# Patient Record
Sex: Female | Born: 1940 | Race: Black or African American | Hispanic: No | Marital: Single | Smoking: Former smoker
Health system: Southern US, Community
[De-identification: ages and names within clinical notes are randomized; demographics above are authoritative.]

## PROBLEM LIST (undated history)

## (undated) DIAGNOSIS — E7800 Pure hypercholesterolemia, unspecified: Secondary | ICD-10-CM

## (undated) DIAGNOSIS — I10 Essential (primary) hypertension: Secondary | ICD-10-CM

## (undated) HISTORY — PX: HAND SURGERY: SHX662

---

## 2000-09-03 ENCOUNTER — Emergency Department (HOSPITAL_COMMUNITY): Admission: EM | Admit: 2000-09-03 | Discharge: 2000-09-03 | Payer: Self-pay | Admitting: Emergency Medicine

## 2000-09-03 ENCOUNTER — Encounter: Payer: Self-pay | Admitting: *Deleted

## 2013-09-23 ENCOUNTER — Emergency Department (HOSPITAL_COMMUNITY)
Admission: EM | Admit: 2013-09-23 | Discharge: 2013-09-23 | Disposition: A | Discharge status: Home / Self Care | Payer: Medicare Other | Attending: Emergency Medicine | Admitting: Emergency Medicine

## 2013-09-23 ENCOUNTER — Encounter (HOSPITAL_COMMUNITY): Payer: Self-pay | Admitting: Emergency Medicine

## 2013-09-23 DIAGNOSIS — R112 Nausea with vomiting, unspecified: Secondary | ICD-10-CM | POA: Insufficient documentation

## 2013-09-23 DIAGNOSIS — E7800 Pure hypercholesterolemia, unspecified: Secondary | ICD-10-CM | POA: Insufficient documentation

## 2013-09-23 DIAGNOSIS — Z79899 Other long term (current) drug therapy: Secondary | ICD-10-CM | POA: Insufficient documentation

## 2013-09-23 DIAGNOSIS — I10 Essential (primary) hypertension: Secondary | ICD-10-CM | POA: Insufficient documentation

## 2013-09-23 DIAGNOSIS — Z7982 Long term (current) use of aspirin: Secondary | ICD-10-CM | POA: Insufficient documentation

## 2013-09-23 DIAGNOSIS — R110 Nausea: Secondary | ICD-10-CM

## 2013-09-23 HISTORY — DX: Pure hypercholesterolemia, unspecified: E78.00

## 2013-09-23 HISTORY — DX: Essential (primary) hypertension: I10

## 2013-09-23 LAB — URINALYSIS, ROUTINE W REFLEX MICROSCOPIC
Bilirubin Urine: NEGATIVE
GLUCOSE, UA: NEGATIVE mg/dL
HGB URINE DIPSTICK: NEGATIVE
Ketones, ur: NEGATIVE mg/dL
Nitrite: NEGATIVE
Protein, ur: NEGATIVE mg/dL
SPECIFIC GRAVITY, URINE: 1.015 (ref 1.005–1.030)
Urobilinogen, UA: 0.2 mg/dL (ref 0.0–1.0)
pH: 5.5 (ref 5.0–8.0)

## 2013-09-23 LAB — COMPREHENSIVE METABOLIC PANEL
ALT: 14 U/L (ref 0–35)
AST: 23 U/L (ref 0–37)
Albumin: 3.9 g/dL (ref 3.5–5.2)
Alkaline Phosphatase: 94 U/L (ref 39–117)
BUN: 15 mg/dL (ref 6–23)
CALCIUM: 9.6 mg/dL (ref 8.4–10.5)
CO2: 27 meq/L (ref 19–32)
Chloride: 103 mEq/L (ref 96–112)
Creatinine, Ser: 1.08 mg/dL (ref 0.50–1.10)
GFR calc Af Amer: 58 mL/min — ABNORMAL LOW (ref >90)
GFR, EST NON AFRICAN AMERICAN: 50 mL/min — AB (ref >90)
Glucose, Bld: 109 mg/dL — ABNORMAL HIGH (ref 70–99)
Potassium: 4.3 mEq/L (ref 3.7–5.3)
SODIUM: 142 meq/L (ref 137–147)
TOTAL PROTEIN: 7.2 g/dL (ref 6.0–8.3)
Total Bilirubin: 0.3 mg/dL (ref 0.3–1.2)

## 2013-09-23 LAB — CBC WITH DIFFERENTIAL/PLATELET
BASOS ABS: 0 10*3/uL (ref 0.0–0.1)
Basophils Relative: 0 % (ref 0–1)
EOS PCT: 0 % (ref 0–5)
Eosinophils Absolute: 0 10*3/uL (ref 0.0–0.7)
HCT: 42.2 % (ref 36.0–46.0)
Hemoglobin: 13.9 g/dL (ref 12.0–15.0)
LYMPHS PCT: 20 % (ref 12–46)
Lymphs Abs: 2 10*3/uL (ref 0.7–4.0)
MCH: 31.1 pg (ref 26.0–34.0)
MCHC: 32.9 g/dL (ref 30.0–36.0)
MCV: 94.4 fL (ref 78.0–100.0)
Monocytes Absolute: 0.6 10*3/uL (ref 0.1–1.0)
Monocytes Relative: 6 % (ref 3–12)
NEUTROS ABS: 7.6 10*3/uL (ref 1.7–7.7)
NEUTROS PCT: 74 % (ref 43–77)
PLATELETS: 262 10*3/uL (ref 150–400)
RBC: 4.47 MIL/uL (ref 3.87–5.11)
RDW: 12.7 % (ref 11.5–15.5)
WBC: 10.3 10*3/uL (ref 4.0–10.5)

## 2013-09-23 LAB — URINE MICROSCOPIC-ADD ON

## 2013-09-23 LAB — LIPASE, BLOOD: Lipase: 32 U/L (ref 11–59)

## 2013-09-23 MED ORDER — ONDANSETRON HCL 4 MG/2ML IJ SOLN
4.0000 mg | Freq: Once | INTRAMUSCULAR | Status: AC
  Administered 2013-09-23: 4 mg via INTRAVENOUS
  Filled 2013-09-23: qty 2

## 2013-09-23 MED ORDER — ONDANSETRON 4 MG PO TBDP: 4.0000 mg | ORAL_TABLET | Freq: Three times a day (TID) | ORAL | Status: DC | PRN

## 2013-09-23 NOTE — Discharge Instructions (Signed)
Take zofran as needed for nausea. Refer to attached documents for more information. Return to the ED with worsening or concerning symptoms.  °

## 2013-09-23 NOTE — ED Notes (Signed)
Pt unable to void at this time, has been told to advise staff when the need arises.

## 2013-09-23 NOTE — ED Notes (Signed)
Pt complains of vomiting since about 2am

## 2013-09-23 NOTE — ED Provider Notes (Signed)
Medical screening examination/treatment/procedure(s) were conducted as a shared visit with non-physician practitioner(s) and myself.  I personally evaluated the patient during the encounter.   EKG Interpretation None     Pt seen with PA.  Nausea, no vomiting.  Labs normal, feeling better after zofran.  Olivia Mackielga M Jezlyn Westerfield, MD 09/23/13 [phone]

## 2013-09-23 NOTE — ED Provider Notes (Signed)
CSN: [id number]     Arrival date & time 09/23/13  0346 History   First MD Initiated Contact with Patient 09/23/13 [phone]     Chief Complaint  Patient presents with  . Emesis     (Consider location/radiation/quality/duration/timing/severity/associated sxs/prior Treatment) HPI Comments: Patient is a 73 year old female with a past medical history of hypertension and HLD who presents with nausea since last night. Symptoms started gradually and progressively worsened since the onset. Patient denies any associated symptoms. She denies vomiting. Patient denies any sick contacts. No aggravating/alleviating factors.   Patient is a 73 y.o. female presenting with vomiting.  Emesis Associated symptoms: no abdominal pain, no arthralgias, no chills and no diarrhea     Past Medical History  Diagnosis Date  . Hypertension   . High cholesterol    History reviewed. No pertinent past surgical history. History reviewed. No pertinent family history. History  Substance Use Topics  . Smoking status: Never Smoker   . Smokeless tobacco: Not on file  . Alcohol Use: No   OB History   Grav Para Term Preterm Abortions TAB SAB Ect Mult Living                 Review of Systems  Constitutional: Negative for fever, chills and fatigue.  HENT: Negative for trouble swallowing.   Eyes: Negative for visual disturbance.  Respiratory: Negative for shortness of breath.   Cardiovascular: Negative for chest pain and palpitations.  Gastrointestinal: Positive for nausea. Negative for vomiting, abdominal pain and diarrhea.  Genitourinary: Negative for dysuria and difficulty urinating.  Musculoskeletal: Negative for arthralgias and neck pain.  Skin: Negative for color change.  Neurological: Negative for dizziness and weakness.  Psychiatric/Behavioral: Negative for dysphoric mood.      Allergies  Review of patient's allergies indicates no known allergies.  Home Medications   Current Outpatient Rx  Name  Route   Sig  Dispense  Refill  . aspirin EC 81 MG tablet   Oral   Take 81 mg by mouth daily.         . Aspirin-Caffeine 845-65 MG PACK   Oral   Take 1 packet by mouth every 8 (eight) hours as needed (pain).         . Cholecalciferol (VITAMIN D3) 3000 UNITS TABS   Oral   Take 1 tablet by mouth daily.         Marland Kitchen diltiazem (DILACOR XR) 240 MG 24 hr capsule   Oral   Take 240 mg by mouth daily.         Marland Kitchen lovastatin (MEVACOR) 40 MG tablet   Oral   Take 40 mg by mouth at bedtime.         . metoprolol (LOPRESSOR) 100 MG tablet   Oral   Take 50-100 mg by mouth 2 (two) times daily. Take 0ne tablet in am and 1/2 tablet in pm         . omega-3 acid ethyl esters (LOVAZA) 1 G capsule   Oral   Take 1 g by mouth daily.          BP 195/77  Pulse 69  Temp(Src) 98.7 F (37.1 C) (Oral)  Resp 20  Ht 5' 3.5" (1.613 m)  Wt 190 lb (86.183 kg)  BMI 33.12 kg/m2  SpO2 99% Physical Exam  Nursing note and vitals reviewed. Constitutional: She is oriented to person, place, and time. She appears well-developed and well-nourished. No distress.  HENT:  Head: Normocephalic and atraumatic.  Eyes: Conjunctivae and EOM are normal.  Neck: Normal range of motion.  Cardiovascular: Normal rate and regular rhythm.  Exam reveals no gallop and no friction rub.   No murmur heard. Pulmonary/Chest: Effort normal and breath sounds normal. She has no wheezes. She has no rales. She exhibits no tenderness.  Abdominal: Soft. She exhibits no distension. There is no tenderness. There is no rebound and no guarding.  Musculoskeletal: Normal range of motion.  Neurological: She is alert and oriented to person, place, and time. Coordination normal.  Speech is goal-oriented. Moves limbs without ataxia.   Skin: Skin is warm and dry.  Psychiatric: She has a normal mood and affect. Her behavior is normal.    ED Course  Procedures (including critical care time) Labs Review Labs Reviewed  COMPREHENSIVE METABOLIC  PANEL - Abnormal; Notable for the following:    Glucose, Bld 109 (*)    GFR calc non Af Amer 50 (*)    GFR calc Af Amer 58 (*)    All other components within normal limits  URINALYSIS, ROUTINE W REFLEX MICROSCOPIC - Abnormal; Notable for the following:    Leukocytes, UA SMALL (*)    All other components within normal limits  CBC WITH DIFFERENTIAL  LIPASE, BLOOD  URINE MICROSCOPIC-ADD ON   Imaging Review No results found.   EKG Interpretation None      MDM   Final diagnoses:  Nausea    6:45 AM Patient labs unremarkable for acute changes. Patient's urinalysis pending. Vitals stable and patient afebrile. Patient is feeling better after IV zofran.   7:01 AM Patient has no UTI. Patient will be discharged with ODT zofran. Patient advised to return to the ED with worsening or concerning symptoms. Vitals stable and patient afebrile.     Emilia BeckKaitlyn Evangelia Whitaker, [place] 09/23/13 [phone]

## 2014-02-03 ENCOUNTER — Emergency Department (HOSPITAL_COMMUNITY)
Admission: EM | Admit: 2014-02-03 | Discharge: 2014-02-03 | Disposition: A | Discharge status: Home / Self Care | Payer: Medicare Other | Attending: Emergency Medicine | Admitting: Emergency Medicine

## 2014-02-03 ENCOUNTER — Encounter (HOSPITAL_COMMUNITY): Payer: Self-pay | Admitting: Emergency Medicine

## 2014-02-03 DIAGNOSIS — Z79899 Other long term (current) drug therapy: Secondary | ICD-10-CM | POA: Diagnosis not present

## 2014-02-03 DIAGNOSIS — R110 Nausea: Secondary | ICD-10-CM | POA: Insufficient documentation

## 2014-02-03 DIAGNOSIS — E7800 Pure hypercholesterolemia, unspecified: Secondary | ICD-10-CM | POA: Diagnosis not present

## 2014-02-03 DIAGNOSIS — R1115 Cyclical vomiting syndrome unrelated to migraine: Secondary | ICD-10-CM | POA: Insufficient documentation

## 2014-02-03 DIAGNOSIS — R1110 Vomiting, unspecified: Secondary | ICD-10-CM | POA: Diagnosis present

## 2014-02-03 DIAGNOSIS — I10 Essential (primary) hypertension: Secondary | ICD-10-CM | POA: Diagnosis not present

## 2014-02-03 LAB — CBC WITH DIFFERENTIAL/PLATELET
Basophils Absolute: 0 10*3/uL (ref 0.0–0.1)
Basophils Relative: 0 % (ref 0–1)
Eosinophils Absolute: 0 10*3/uL (ref 0.0–0.7)
Eosinophils Relative: 0 % (ref 0–5)
HCT: 41.9 % (ref 36.0–46.0)
Hemoglobin: 13.9 g/dL (ref 12.0–15.0)
LYMPHS ABS: 1.9 10*3/uL (ref 0.7–4.0)
LYMPHS PCT: 18 % (ref 12–46)
MCH: 31.2 pg (ref 26.0–34.0)
MCHC: 33.2 g/dL (ref 30.0–36.0)
MCV: 93.9 fL (ref 78.0–100.0)
Monocytes Absolute: 0.3 10*3/uL (ref 0.1–1.0)
Monocytes Relative: 3 % (ref 3–12)
NEUTROS ABS: 7.9 10*3/uL — AB (ref 1.7–7.7)
NEUTROS PCT: 79 % — AB (ref 43–77)
PLATELETS: 291 10*3/uL (ref 150–400)
RBC: 4.46 MIL/uL (ref 3.87–5.11)
RDW: 12.7 % (ref 11.5–15.5)
WBC: 10.2 10*3/uL (ref 4.0–10.5)

## 2014-02-03 LAB — COMPREHENSIVE METABOLIC PANEL
ALK PHOS: 79 U/L (ref 39–117)
ALT: 13 U/L (ref 0–35)
AST: 22 U/L (ref 0–37)
Albumin: 4.1 g/dL (ref 3.5–5.2)
Anion gap: 14 (ref 5–15)
BUN: 16 mg/dL (ref 6–23)
CO2: 25 meq/L (ref 19–32)
Calcium: 9.8 mg/dL (ref 8.4–10.5)
Chloride: 104 mEq/L (ref 96–112)
Creatinine, Ser: 1.06 mg/dL (ref 0.50–1.10)
GFR, EST AFRICAN AMERICAN: 59 mL/min — AB (ref >90)
GFR, EST NON AFRICAN AMERICAN: 51 mL/min — AB (ref >90)
Glucose, Bld: 115 mg/dL — ABNORMAL HIGH (ref 70–99)
POTASSIUM: 4 meq/L (ref 3.7–5.3)
SODIUM: 143 meq/L (ref 137–147)
TOTAL PROTEIN: 7.7 g/dL (ref 6.0–8.3)
Total Bilirubin: 0.5 mg/dL (ref 0.3–1.2)

## 2014-02-03 LAB — URINE MICROSCOPIC-ADD ON

## 2014-02-03 LAB — URINALYSIS, ROUTINE W REFLEX MICROSCOPIC
Bilirubin Urine: NEGATIVE
Glucose, UA: NEGATIVE mg/dL
Hgb urine dipstick: NEGATIVE
Ketones, ur: NEGATIVE mg/dL
Nitrite: NEGATIVE
PROTEIN: NEGATIVE mg/dL
Specific Gravity, Urine: 1.008 (ref 1.005–1.030)
Urobilinogen, UA: 0.2 mg/dL (ref 0.0–1.0)
pH: 5.5 (ref 5.0–8.0)

## 2014-02-03 LAB — I-STAT TROPONIN, ED: Troponin i, poc: 0 ng/mL (ref 0.00–0.08)

## 2014-02-03 LAB — LIPASE, BLOOD: Lipase: 34 U/L (ref 11–59)

## 2014-02-03 MED ORDER — SODIUM CHLORIDE 0.9 % IV BOLUS (SEPSIS)
1000.0000 mL | Freq: Once | INTRAVENOUS | Status: AC
  Administered 2014-02-03: 1000 mL via INTRAVENOUS

## 2014-02-03 MED ORDER — ONDANSETRON 4 MG PO TBDP: ORAL_TABLET | ORAL | Status: DC

## 2014-02-03 MED ORDER — ONDANSETRON HCL 4 MG/2ML IJ SOLN
4.0000 mg | Freq: Once | INTRAMUSCULAR | Status: AC
  Administered 2014-02-03: 4 mg via INTRAVENOUS
  Filled 2014-02-03: qty 2

## 2014-02-03 NOTE — ED Provider Notes (Signed)
CSN: [id number]     Arrival date & time 02/03/14  1906 History   First MD Initiated Contact with Patient 02/03/14 1943     Chief Complaint  Patient presents with  . Emesis     (Consider location/radiation/quality/duration/timing/severity/associated sxs/prior Treatment) The history is provided by the patient.  Beverly Howell is a 73 y.o. female hx of HTN, HL here with vomiting. Vomiting since 2pm today. Patient ate some food and then has vomited 3 times since then. Denies any abdominal pain or chest pain. Denies any urinary symptoms or constipation or diarrhea. Denies any fevers. Denies any headaches. Denies any previous abdominal surgeries and no sick contacts.    Past Medical History  Diagnosis Date  . Hypertension   . High cholesterol    History reviewed. No pertinent past surgical history. History reviewed. No pertinent family history. History  Substance Use Topics  . Smoking status: Never Smoker   . Smokeless tobacco: Not on file  . Alcohol Use: No   OB History   Grav Para Term Preterm Abortions TAB SAB Ect Mult Living                 Review of Systems  Gastrointestinal: Positive for vomiting.  All other systems reviewed and are negative.     Allergies  Review of patient's allergies indicates no known allergies.  Home Medications   Prior to Admission medications   Medication Sig Start Date End Date Taking? Authorizing Provider  Aspirin-Caffeine 845-65 MG PACK Take 1 packet by mouth every 8 (eight) hours as needed (pain).   Yes Historical Provider, MD  Cholecalciferol (VITAMIN D3) 3000 UNITS TABS Take 1 tablet by mouth daily.   Yes Historical Provider, MD  diltiazem (DILACOR XR) 240 MG 24 hr capsule Take 240 mg by mouth daily.   Yes Historical Provider, MD  lovastatin (MEVACOR) 40 MG tablet Take 40 mg by mouth at bedtime.   Yes Historical Provider, MD  metoprolol (LOPRESSOR) 100 MG tablet Take 50-100 mg by mouth 2 (two) times daily. Take 0ne tablet in am and 1/2  tablet in pm   Yes Historical Provider, MD  omega-3 acid ethyl esters (LOVAZA) 1 G capsule Take 1 g by mouth daily.   Yes Historical Provider, MD   BP 172/74  Pulse 61  Temp(Src) 98.7 F (37.1 C) (Oral)  Resp 16  Ht  (1.6 m)  Wt 193 lb (87.544 kg)  BMI 34.20 kg/m2  SpO2 98% Physical Exam  Nursing note and vitals reviewed. Constitutional: She is oriented to person, place, and time. She appears well-developed.  Slightly uncomfortable   HENT:  Head: Normocephalic.  MM slightly dry   Eyes: Conjunctivae are normal. Pupils are equal, round, and reactive to light.  Neck: Normal range of motion. Neck supple.  Cardiovascular: Normal rate, regular rhythm and normal heart sounds.   Pulmonary/Chest: Effort normal and breath sounds normal. No respiratory distress. She has no wheezes. She has no rales.  Abdominal: Soft. Bowel sounds are normal. She exhibits no distension. There is no tenderness. There is no rebound and no guarding.  Musculoskeletal: Normal range of motion. She exhibits no edema and no tenderness.  Neurological: She is alert and oriented to person, place, and time. No cranial nerve deficit. Coordination normal.  Skin: Skin is warm and dry.  Psychiatric: She has a normal mood and affect. Her behavior is normal. Judgment and thought content normal.    ED Course  Procedures (including critical care time) Labs Review Labs  Reviewed  CBC WITH DIFFERENTIAL - Abnormal; Notable for the following:    Neutrophils Relative % 79 (*)    Neutro Abs 7.9 (*)    All other components within normal limits  COMPREHENSIVE METABOLIC PANEL - Abnormal; Notable for the following:    Glucose, Bld 115 (*)    GFR calc non Af Amer 51 (*)    GFR calc Af Amer 59 (*)    All other components within normal limits  URINALYSIS, ROUTINE W REFLEX MICROSCOPIC - Abnormal; Notable for the following:    APPearance CLOUDY (*)    Leukocytes, UA SMALL (*)    All other components within normal limits  LIPASE,  BLOOD  URINE MICROSCOPIC-ADD ON  I-STAT TROPOININ, ED    Imaging Review No results found.   EKG Interpretation   Date/Time:  Sunday February 03 2014 19:45:15 EDT Ventricular Rate:  64 PR Interval:  182 QRS Duration: 97 QT Interval:  373 QTC Calculation: 385 R Axis:   38 Text Interpretation:  Sinus rhythm Baseline wander in lead(s) III aVF No  previous ECGs available Confirmed by Aymara Sassi  MD, Tennyson Kallen ([id number]) on 02/03/2014  8:19:46 PM      MDM   Final diagnoses:  None    Beverly Howell is a 73 y.o. female here with vomiting. No abdominal pain. Hypertensive initially but no chest pain or abdominal pain so I doubt dissection. Will check labs, hydrate, give zofran and reassess.   10:29 PM BP now 170s with no BP meds given. Abdomen soft nontender. Labs unremarkable. Able to tolerate PO well. Likely gastro. Will d/c home with zofran.      Richardean Canal, MD 02/03/14 2230

## 2014-02-03 NOTE — ED Notes (Signed)
Pt has not been able to tolerate anything PO.  Has vomited x 3 since 1430.  Pt is A&O x 4.  Denies abdominal pain.

## 2014-02-03 NOTE — Discharge Instructions (Signed)
Stay hydrated.   Take zofran for nausea.   Follow up with your doctor.   Return to ER if you have dehydration, vomiting, abdominal pain.

## 2017-03-25 DIAGNOSIS — Z5321 Procedure and treatment not carried out due to patient leaving prior to being seen by health care provider: Secondary | ICD-10-CM | POA: Insufficient documentation

## 2017-03-25 DIAGNOSIS — R1110 Vomiting, unspecified: Secondary | ICD-10-CM | POA: Diagnosis present

## 2017-03-26 ENCOUNTER — Encounter (HOSPITAL_COMMUNITY): Payer: Self-pay | Admitting: *Deleted

## 2017-03-26 ENCOUNTER — Emergency Department (HOSPITAL_COMMUNITY)
Admission: EM | Admit: 2017-03-26 | Discharge: 2017-03-26 | Disposition: A | Discharge status: Home / Self Care | Payer: Medicare Other | Attending: Emergency Medicine | Admitting: Emergency Medicine

## 2017-03-26 LAB — COMPREHENSIVE METABOLIC PANEL
ALK PHOS: 74 U/L (ref 38–126)
ALT: 21 U/L (ref 14–54)
AST: 26 U/L (ref 15–41)
Albumin: 4.3 g/dL (ref 3.5–5.0)
Anion gap: 11 (ref 5–15)
BILIRUBIN TOTAL: 0.4 mg/dL (ref 0.3–1.2)
BUN: 24 mg/dL — ABNORMAL HIGH (ref 6–20)
CALCIUM: 9.2 mg/dL (ref 8.9–10.3)
CO2: 22 mmol/L (ref 22–32)
CREATININE: 1.34 mg/dL — AB (ref 0.44–1.00)
Chloride: 107 mmol/L (ref 101–111)
GFR, EST AFRICAN AMERICAN: 44 mL/min — AB (ref >60)
GFR, EST NON AFRICAN AMERICAN: 38 mL/min — AB (ref >60)
Glucose, Bld: 162 mg/dL — ABNORMAL HIGH (ref 65–99)
Potassium: 3.5 mmol/L (ref 3.5–5.1)
Sodium: 140 mmol/L (ref 135–145)
TOTAL PROTEIN: 7.5 g/dL (ref 6.5–8.1)

## 2017-03-26 LAB — CBC
HCT: 41 % (ref 36.0–46.0)
Hemoglobin: 13.7 g/dL (ref 12.0–15.0)
MCH: 31.1 pg (ref 26.0–34.0)
MCHC: 33.4 g/dL (ref 30.0–36.0)
MCV: 93 fL (ref 78.0–100.0)
PLATELETS: 295 10*3/uL (ref 150–400)
RBC: 4.41 MIL/uL (ref 3.87–5.11)
RDW: 12.9 % (ref 11.5–15.5)
WBC: 13.9 10*3/uL — ABNORMAL HIGH (ref 4.0–10.5)

## 2017-03-26 LAB — LIPASE, BLOOD: Lipase: 31 U/L (ref 11–51)

## 2017-03-26 NOTE — ED Triage Notes (Signed)
Pt reports that around 9pm she had onset of vomiting. She denies abdominal pain or urinary symptoms. Denies blood in her emesis.

## 2017-03-26 NOTE — ED Notes (Signed)
Per registration, the pt gave them stickers and left.

## 2020-04-21 ENCOUNTER — Inpatient Hospital Stay (HOSPITAL_COMMUNITY)
Admission: EM | Admit: 2020-04-21 | Discharge: 2020-04-28 | DRG: 177 | Disposition: A | Discharge status: Skilled Nursing Facility | Payer: Medicare (Managed Care) | Attending: Internal Medicine | Admitting: Internal Medicine

## 2020-04-21 ENCOUNTER — Emergency Department (HOSPITAL_COMMUNITY): Payer: Medicare (Managed Care)

## 2020-04-21 ENCOUNTER — Other Ambulatory Visit: Payer: Self-pay

## 2020-04-21 DIAGNOSIS — I639 Cerebral infarction, unspecified: Secondary | ICD-10-CM | POA: Diagnosis not present

## 2020-04-21 DIAGNOSIS — R1310 Dysphagia, unspecified: Secondary | ICD-10-CM | POA: Diagnosis present

## 2020-04-21 DIAGNOSIS — Z79899 Other long term (current) drug therapy: Secondary | ICD-10-CM

## 2020-04-21 DIAGNOSIS — N179 Acute kidney failure, unspecified: Secondary | ICD-10-CM | POA: Diagnosis present

## 2020-04-21 DIAGNOSIS — R29703 NIHSS score 3: Secondary | ICD-10-CM | POA: Diagnosis present

## 2020-04-21 DIAGNOSIS — I63411 Cerebral infarction due to embolism of right middle cerebral artery: Secondary | ICD-10-CM | POA: Diagnosis present

## 2020-04-21 DIAGNOSIS — J9601 Acute respiratory failure with hypoxia: Secondary | ICD-10-CM | POA: Diagnosis present

## 2020-04-21 DIAGNOSIS — J9600 Acute respiratory failure, unspecified whether with hypoxia or hypercapnia: Secondary | ICD-10-CM | POA: Diagnosis present

## 2020-04-21 DIAGNOSIS — G8194 Hemiplegia, unspecified affecting left nondominant side: Secondary | ICD-10-CM | POA: Diagnosis present

## 2020-04-21 DIAGNOSIS — I10 Essential (primary) hypertension: Secondary | ICD-10-CM | POA: Diagnosis not present

## 2020-04-21 DIAGNOSIS — Z789 Other specified health status: Secondary | ICD-10-CM

## 2020-04-21 DIAGNOSIS — E7800 Pure hypercholesterolemia, unspecified: Secondary | ICD-10-CM | POA: Diagnosis present

## 2020-04-21 DIAGNOSIS — R471 Dysarthria and anarthria: Secondary | ICD-10-CM | POA: Diagnosis present

## 2020-04-21 DIAGNOSIS — Z87891 Personal history of nicotine dependence: Secondary | ICD-10-CM

## 2020-04-21 DIAGNOSIS — I82411 Acute embolism and thrombosis of right femoral vein: Secondary | ICD-10-CM | POA: Diagnosis present

## 2020-04-21 DIAGNOSIS — Z823 Family history of stroke: Secondary | ICD-10-CM | POA: Diagnosis not present

## 2020-04-21 DIAGNOSIS — E6609 Other obesity due to excess calories: Secondary | ICD-10-CM | POA: Diagnosis present

## 2020-04-21 DIAGNOSIS — M795 Residual foreign body in soft tissue: Secondary | ICD-10-CM

## 2020-04-21 DIAGNOSIS — N1831 Chronic kidney disease, stage 3a: Secondary | ICD-10-CM | POA: Diagnosis present

## 2020-04-21 DIAGNOSIS — I129 Hypertensive chronic kidney disease with stage 1 through stage 4 chronic kidney disease, or unspecified chronic kidney disease: Secondary | ICD-10-CM | POA: Diagnosis present

## 2020-04-21 DIAGNOSIS — U071 COVID-19: Secondary | ICD-10-CM | POA: Diagnosis present

## 2020-04-21 DIAGNOSIS — I6389 Other cerebral infarction: Secondary | ICD-10-CM | POA: Diagnosis not present

## 2020-04-21 DIAGNOSIS — Z20822 Contact with and (suspected) exposure to covid-19: Secondary | ICD-10-CM

## 2020-04-21 DIAGNOSIS — E785 Hyperlipidemia, unspecified: Secondary | ICD-10-CM | POA: Diagnosis present

## 2020-04-21 DIAGNOSIS — I361 Nonrheumatic tricuspid (valve) insufficiency: Secondary | ICD-10-CM | POA: Diagnosis not present

## 2020-04-21 DIAGNOSIS — J1282 Pneumonia due to coronavirus disease 2019: Secondary | ICD-10-CM | POA: Diagnosis present

## 2020-04-21 DIAGNOSIS — I82432 Acute embolism and thrombosis of left popliteal vein: Secondary | ICD-10-CM | POA: Diagnosis present

## 2020-04-21 DIAGNOSIS — Z8673 Personal history of transient ischemic attack (TIA), and cerebral infarction without residual deficits: Secondary | ICD-10-CM

## 2020-04-21 DIAGNOSIS — I824Y3 Acute embolism and thrombosis of unspecified deep veins of proximal lower extremity, bilateral: Secondary | ICD-10-CM | POA: Diagnosis not present

## 2020-04-21 DIAGNOSIS — Z6836 Body mass index (BMI) 36.0-36.9, adult: Secondary | ICD-10-CM

## 2020-04-21 DIAGNOSIS — R430 Anosmia: Secondary | ICD-10-CM

## 2020-04-21 DIAGNOSIS — R7989 Other specified abnormal findings of blood chemistry: Secondary | ICD-10-CM | POA: Diagnosis not present

## 2020-04-21 LAB — DIFFERENTIAL
Abs Immature Granulocytes: 0.21 10*3/uL — ABNORMAL HIGH (ref 0.00–0.07)
Basophils Absolute: 0 10*3/uL (ref 0.0–0.1)
Basophils Relative: 0 %
Eosinophils Absolute: 0 10*3/uL (ref 0.0–0.5)
Eosinophils Relative: 0 %
Immature Granulocytes: 2 %
Lymphocytes Relative: 14 %
Lymphs Abs: 1.7 10*3/uL (ref 0.7–4.0)
Monocytes Absolute: 1.1 10*3/uL — ABNORMAL HIGH (ref 0.1–1.0)
Monocytes Relative: 8 %
Neutro Abs: 9.8 10*3/uL — ABNORMAL HIGH (ref 1.7–7.7)
Neutrophils Relative %: 76 %

## 2020-04-21 LAB — COMPREHENSIVE METABOLIC PANEL
ALT: 16 U/L (ref 0–44)
AST: 31 U/L (ref 15–41)
Albumin: 2.6 g/dL — ABNORMAL LOW (ref 3.5–5.0)
Alkaline Phosphatase: 58 U/L (ref 38–126)
Anion gap: 11 (ref 5–15)
BUN: 28 mg/dL — ABNORMAL HIGH (ref 8–23)
CO2: 24 mmol/L (ref 22–32)
Calcium: 8.8 mg/dL — ABNORMAL LOW (ref 8.9–10.3)
Chloride: 100 mmol/L (ref 98–111)
Creatinine, Ser: 1.84 mg/dL — ABNORMAL HIGH (ref 0.44–1.00)
GFR, Estimated: 28 mL/min — ABNORMAL LOW (ref >60)
Glucose, Bld: 124 mg/dL — ABNORMAL HIGH (ref 70–99)
Potassium: 4.8 mmol/L (ref 3.5–5.1)
Sodium: 135 mmol/L (ref 135–145)
Total Bilirubin: 1.3 mg/dL — ABNORMAL HIGH (ref 0.3–1.2)
Total Protein: 6.8 g/dL (ref 6.5–8.1)

## 2020-04-21 LAB — I-STAT CHEM 8, ED
BUN: 38 mg/dL — ABNORMAL HIGH (ref 8–23)
Calcium, Ion: 1.04 mmol/L — ABNORMAL LOW (ref 1.15–1.40)
Chloride: 102 mmol/L (ref 98–111)
Creatinine, Ser: 1.7 mg/dL — ABNORMAL HIGH (ref 0.44–1.00)
Glucose, Bld: 110 mg/dL — ABNORMAL HIGH (ref 70–99)
HCT: 43 % (ref 36.0–46.0)
Hemoglobin: 14.6 g/dL (ref 12.0–15.0)
Potassium: 5.6 mmol/L — ABNORMAL HIGH (ref 3.5–5.1)
Sodium: 139 mmol/L (ref 135–145)
TCO2: 27 mmol/L (ref 22–32)

## 2020-04-21 LAB — CBG MONITORING, ED
Glucose-Capillary: 133 mg/dL — ABNORMAL HIGH (ref 70–99)
Glucose-Capillary: 110 mg/dL — ABNORMAL HIGH (ref 70–99)

## 2020-04-21 LAB — CBC
HCT: 39 % (ref 36.0–46.0)
Hemoglobin: 12.5 g/dL (ref 12.0–15.0)
MCH: 29.9 pg (ref 26.0–34.0)
MCHC: 32.1 g/dL (ref 30.0–36.0)
MCV: 93.3 fL (ref 80.0–100.0)
Platelets: 213 10*3/uL (ref 150–400)
RBC: 4.18 MIL/uL (ref 3.87–5.11)
RDW: 13.7 % (ref 11.5–15.5)
WBC: 12.8 10*3/uL — ABNORMAL HIGH (ref 4.0–10.5)
nRBC: 0.2 % (ref 0.0–0.2)

## 2020-04-21 LAB — PROTIME-INR
INR: 1.5 — ABNORMAL HIGH (ref 0.8–1.2)
Prothrombin Time: 17.2 seconds — ABNORMAL HIGH (ref 11.4–15.2)

## 2020-04-21 LAB — BRAIN NATRIURETIC PEPTIDE: B Natriuretic Peptide: 123.6 pg/mL — ABNORMAL HIGH (ref 0.0–100.0)

## 2020-04-21 LAB — APTT: aPTT: 27 seconds (ref 24–36)

## 2020-04-21 LAB — ETHANOL: Alcohol, Ethyl (B): <10 mg/dL (ref <10)

## 2020-04-21 LAB — TROPONIN I (HIGH SENSITIVITY): Troponin I (High Sensitivity): 22 ng/L — ABNORMAL HIGH (ref <18)

## 2020-04-21 MED ORDER — ASPIRIN 300 MG RE SUPP
300.0000 mg | Freq: Once | RECTAL | Status: AC
  Administered 2020-04-21: 300 mg via RECTAL
  Filled 2020-04-21: qty 1

## 2020-04-21 MED ORDER — ASPIRIN 325 MG PO TABS
325.0000 mg | ORAL_TABLET | Freq: Every day | ORAL | Status: DC
  Filled 2020-04-21: qty 1

## 2020-04-21 MED ORDER — LORAZEPAM 2 MG/ML IJ SOLN
0.5000 mg | Freq: Once | INTRAMUSCULAR | Status: AC
  Administered 2020-04-22: 0.5 mg via INTRAVENOUS
  Filled 2020-04-21: qty 1

## 2020-04-21 NOTE — ED Notes (Signed)
Pt to MRI; on monitor; on 12L O2 by [facility].

## 2020-04-21 NOTE — ED Triage Notes (Addendum)
Pt from home via EMS in ref. To Chest pain for last week. New symptom of weakness andstarted to walk with a cain 3 weeks ago. and never walked with one before. Family spoke to Pt. At 1200 this afternoon and family stated Pt was fine. They last saw her physically yesterday at 46.   Ems has pt on Nonrebreather at 15 L but is not on oxygen at home.  Pt complain os loss of tsste, smell, weakness, dizziness and SOB.   Pt complained of left sideed pain that has now resolved and Pain to chest has also resolved.  Pt was speaking well with EMS but was having trouble speaking on arrival. Pt seemed to get better with O2

## 2020-04-21 NOTE — ED Provider Notes (Signed)
[facility] Provider Note   CSN: [id number] Arrival date & time: 04/21/20  1757     History Chief Complaint  Patient presents with  . Chest Pain    Beverly Howell is a 79 y.o. female.  HPI      79 year old female with history of hypertension, hyperlipidemia, presents with concern for generalized weakness, cough, shortness of breath, loss of taste and smell for 1 week, with finding of waxing and waning left-sided weakness on EMS arrival.  Patient noted to have weakness on fire department arrival, with oxygen saturations in the 70s.  Per EMS, when they placed her on nonrebreather, her dysarthria and left-sided weakness improved--however, on arrival to the emergency department as she was transitioning from the stretcher to the bed, was noted to have hypoxia again down to 82 and had worsening of dysarthria and left-sided weakness.  Initially it was unclear when her last known normal was--family had physically seen her last night at 8 PM prior to seeing her this afternoon and felt that she was generally weak with difficulty speaking, but did not note any left-sided weakness specifically today.  Beverly Howell reports she has had 1 week of feeling weak.  After continued discussion with neurology, reports that she has had constant left upper extremity weakness since 2 PM yesterday.   Pt denies falls, trauma. Does report headache.  No known fevers, n, v or diarrhea.  Reports waxing and waning weakness and speech problems over the last week.  History is limited as pt is difficult to understand, unclear hx from patient of LNW initially.    Past Medical History:  Diagnosis Date  . High cholesterol   . Hypertension     Patient Active Problem List   Diagnosis Date Noted  . Acute respiratory failure (HCC) 04/21/2020    No past surgical history on file.   OB History   No obstetric history on file.     No family history on file.  Social History   Tobacco  Use  . Smoking status: Never Smoker  Substance Use Topics  . Alcohol use: No  . Drug use: No    Home Medications Prior to Admission medications   Medication Sig Start Date End Date Taking? Authorizing Provider  aspirin-acetaminophen-caffeine (EXCEDRIN MIGRAINE) 7260143437250-250-65 MG tablet Take 2 tablets by mouth every 4 (four) hours as needed for headache.   Yes [provider]  Calcium Carb-Cholecalciferol (CALCIUM 600-D PO) Take 1 tablet by mouth daily.   Yes [provider]  cholecalciferol (VITAMIN D) 25 MCG (1000 UNIT) tablet Take 1,000 Units by mouth daily.    Yes [provider]  diltiazem (DILACOR XR) 240 MG 24 hr capsule Take 240 mg by mouth daily.   Yes [provider]  lisinopril (ZESTRIL) 10 MG tablet Take 10 mg by mouth daily. 04/10/20  Yes [provider]  metoprolol (LOPRESSOR) 100 MG tablet Take 100 mg by mouth 2 (two) times daily. Take 0ne tablet in am and 1/2 tablet in pm   Yes [provider]  Omega-3 Fatty Acids (FISH OIL TRIPLE STRENGTH) 1400 MG CAPS Take 1,400 mg by mouth daily.   Yes [provider]    Allergies    Patient has no known allergies.  Review of Systems   Review of Systems  Constitutional: Negative for fever.  HENT: Positive for congestion.   Respiratory: Positive for cough and shortness of breath.   Cardiovascular: Positive for chest pain.  Gastrointestinal: Negative for abdominal  pain, diarrhea, nausea and vomiting.  Genitourinary: Negative for dysuria.  Musculoskeletal: Negative for back pain.  Skin: Negative for rash.  Neurological: Positive for headaches.    Physical Exam Updated Vital Signs BP 131/71   Pulse (!) 101   Temp 98.5 F (36.9 C) (Rectal)   Resp (!) 21   Ht 5\' 3"  (1.6 m)   Wt 79.8 kg   SpO2 100%   BMI 31.18 kg/m   Physical Exam Vitals and nursing note reviewed.  Constitutional:      General: She is not in acute distress.    Appearance: She is well-developed.  She is not diaphoretic.  HENT:     Head: Normocephalic and atraumatic.     Comments: Dry mucous membranes Eyes:     Conjunctiva/sclera: Conjunctivae normal.  Cardiovascular:     Rate and Rhythm: Regular rhythm. Tachycardia present.     Heart sounds: Normal heart sounds. No murmur heard.  No friction rub. No gallop.   Pulmonary:     Effort: Pulmonary effort is normal. Tachypnea present. No respiratory distress.     Breath sounds: Normal breath sounds. No wheezing or rales.  Abdominal:     General: There is no distension.     Palpations: Abdomen is soft.     Tenderness: There is no abdominal tenderness. There is no guarding.  Musculoskeletal:        General: No tenderness.     Cervical back: Normal range of motion.  Skin:    General: Skin is warm and dry.     Findings: No erythema or rash.  Neurological:     Mental Status: She is alert and oriented to person, place, and time.     Comments: Difficult to understand over sound of nonrebreather-dysarthria noted Left UE weakness/pronator drift/decreased grip strength LLE mild weakness/drift No facial droop  midline tongue, normal palate     ED Results / Procedures / Treatments   Labs (all labs ordered are listed, but only abnormal results are displayed) Labs Reviewed  PROTIME-INR - Abnormal; Notable for the following components:      Result Value   Prothrombin Time 17.2 (*)    INR 1.5 (*)    All other components within normal limits  CBC - Abnormal; Notable for the following components:   WBC 12.8 (*)    All other components within normal limits  DIFFERENTIAL - Abnormal; Notable for the following components:   Neutro Abs 9.8 (*)    Monocytes Absolute 1.1 (*)    Abs Immature Granulocytes 0.21 (*)    All other components within normal limits  COMPREHENSIVE METABOLIC PANEL - Abnormal; Notable for the following components:   Glucose, Bld 124 (*)    BUN 28 (*)    Creatinine, Ser 1.84 (*)    Calcium 8.8 (*)    Albumin 2.6  (*)    Total Bilirubin 1.3 (*)    GFR, Estimated 28 (*)    All other components within normal limits  BRAIN NATRIURETIC PEPTIDE - Abnormal; Notable for the following components:   B Natriuretic Peptide 123.6 (*)    All other components within normal limits  CBG MONITORING, ED - Abnormal; Notable for the following components:   Glucose-Capillary 133 (*)    All other components within normal limits  I-STAT CHEM 8, ED - Abnormal; Notable for the following components:   Potassium 5.6 (*)    BUN 38 (*)    Creatinine, Ser 1.70 (*)    Glucose, Bld 110 (*)  Calcium, Ion 1.04 (*)    All other components within normal limits  CBG MONITORING, ED - Abnormal; Notable for the following components:   Glucose-Capillary 110 (*)    All other components within normal limits  TROPONIN I (HIGH SENSITIVITY) - Abnormal; Notable for the following components:   Troponin I (High Sensitivity) 22 (*)    All other components within normal limits  RESPIRATORY PANEL BY RT PCR (FLU A&B, COVID)  CULTURE, BLOOD (ROUTINE X 2)  CULTURE, BLOOD (ROUTINE X 2)  ETHANOL  APTT  RAPID URINE DRUG SCREEN, HOSP PERFORMED  URINALYSIS, ROUTINE W REFLEX MICROSCOPIC  TROPONIN I (HIGH SENSITIVITY)    EKG EKG Interpretation  Date/Time:  Monday April 21 2020 18:15:42 EST Ventricular Rate:  89 PR Interval:    QRS Duration: 122 QT Interval:  409 QTC Calculation: 504 R Axis:   0 Text Interpretation: Artifact, suspect sinus rhythm Right bundle branch block No significant change since last tracing Confirmed by Alvira Monday ([id number]) on 04/21/2020 9:33:51 PM   Radiology CT Head Wo Contrast  Result Date: 04/21/2020 CLINICAL DATA:  79 year old female with intermittent dysarthria and left side weakness x1 week, progressive since 1400 hours on 04/20/2020. Hypoxia on initial presentation. EXAM: CT HEAD WITHOUT CONTRAST TECHNIQUE: Contiguous axial images were obtained from the base of the skull through the vertex without  intravenous contrast. COMPARISON:  None. FINDINGS: Brain: Small chronic infarct in the left cerebellum (series 3, image 8). No acute intracranial hemorrhage identified. No midline shift, mass effect, or evidence of intracranial mass lesion. No ventriculomegaly. Mild for age scattered bilateral white matter hypodensity. Small age indeterminate hypodense area in the posterior right insula on series 3, image 15. No other no acute or subacute cortically based infarct identified. No supratentorial cortical encephalomalacia identified. Deep gray matter nuclei and brainstem appear within normal limits. Vascular: Calcified atherosclerosis at the skull base. No suspicious intracranial vascular hyperdensity. Skull: No acute osseous abnormality identified. Sinuses/Orbits: Small fluid level in the left maxillary sinus. Bubbly opacity there as well as in the right maxillary and sphenoid sinuses. Bubbly opacity in the posterior left ethmoid. Tympanic cavities and mastoids appear clear. Other: No acute orbit or scalp soft tissue finding identified. IMPRESSION: 1. Small infarct at the posterior right insula, age indeterminate but suspicious for subacute ischemia in this setting. No associated hemorrhage or mass effect. 2. Elsewhere mild for age cerebral white matter changes, and a small chronic left cerebellar infarct. 3. Acute paranasal sinus inflammation. Electronically Signed   By: Odessa Fleming M.D.   On: 04/21/2020 22:56   DG Chest Portable 1 View  Result Date: 04/21/2020 CLINICAL DATA:  Hypoxia EXAM: PORTABLE CHEST 1 VIEW COMPARISON:  None. FINDINGS: The heart size is enlarged. There are hazy bilateral airspace opacities. There is no pneumothorax. No large pleural effusion. There are end-stage degenerative changes of both glenohumeral joints. IMPRESSION: Cardiomegaly with hazy bilateral airspace opacities concerning for pulmonary edema or an atypical infectious process. Electronically Signed   By: Katherine Mantle M.D.   On:  04/21/2020 20:21    Procedures .Critical Care Performed by: Alvira Monday, MD Authorized by: Alvira Monday, MD   Critical care provider statement:    Critical care time (minutes):  90   Critical care was time spent personally by me on the following activities:  Discussions with consultants, evaluation of patient's response to treatment, examination of patient, ordering and performing treatments and interventions, ordering and review of laboratory studies, ordering and review of radiographic studies, pulse oximetry, re-evaluation  of patient's condition, obtaining history from patient or surrogate and review of old charts Angiocath insertion  Date/Time: 04/22/2020 12:07 AM Performed by: Alvira Monday, MD Authorized by: Alvira Monday, MD  Preparation: Patient was prepped and draped in the usual sterile fashion. Local anesthesia used: no  Anesthesia: Local anesthesia used: no  Sedation: Patient sedated: no  Patient tolerance: patient tolerated the procedure well with no immediate complications Comments: Multiple attempts, small amount of blood back on 2, successful placement of IV and blood drawn third location, however IV removed prior to CT    (including critical care time)  Medications Ordered in ED Medications  aspirin tablet 325 mg (325 mg Oral Not Given 04/21/20 2133)  aspirin suppository 300 mg (300 mg Rectal Given 04/21/20 2152)  LORazepam (ATIVAN) injection 0.5 mg (0.5 mg Intravenous Given 04/22/20 0013)    ED Course  I have reviewed the triage vital signs and the nursing notes.  Pertinent labs & imaging results that were available during my care of the patient were reviewed by me and considered in my medical decision making (see chart for details).    MDM Rules/Calculators/A&P                          79 year old female with history of hypertension, hyperlipidemia, presents with concern for generalized weakness, cough, shortness of breath, loss of taste  and smell for 1 week, with finding of waxing and waning left-sided weakness on EMS arrival.  Discussed with patient and family on arrival concern for COVID 19 by history of loss of taste and smell, hypoxia, cough and also concern for acute CVA.  Unclear last known normal and discussed given this she is not within a window for tPA.  Plan initially to obtain IV access and order emergent CT angio, however IV placement was very difficult, and while IV was obtained, it again was removed.  Family arrived as I was placing IV, and reported they did not note any left sided weakness but had not specifically looked for it, given waxing and waning symptoms and unclear LKW called Neurology and made decision to call Code Stroke. Code stroke activated and Dr. Derry Lory came to bedside to evaluate the patient. During his evaluation, she is clear that the symptoms began at [facility] yesterday and Code Stroke cancelled and plan made for MRI and MRA. Head (and carotid doppler.)  CT head ordered by me given headache does show concern for right insular infarct, age indeterminate, likely subacute. Labs otherwise with mild AKI.  Suspect COVID 19 by history however unfortunately testing still pending at this time. Discussed with Dr. Toniann Fail and will admit for further care of stroke and acute hypoxic respiratory failure likely secondary to covid 19.     Final Clinical Impression(s) / ED Diagnoses Final diagnoses:  Cerebrovascular accident (CVA), unspecified mechanism (HCC)  Acute respiratory failure with hypoxia (HCC)  Anosmia  Suspected COVID-19 virus infection    Rx / DC Orders ED Discharge Orders    None       Alvira Monday, MD 04/22/20 0021

## 2020-04-21 NOTE — Consult Note (Signed)
NEUROLOGY CONSULTATION NOTE   Date of service: April 21, 2020 Patient Name: Beverly Howell MRN:  [id number] DOB:  04-01-1941 Reason for consult: "stroke code"  History of Present Illness  Beverly Howell is a 79 y.o. female with PMH significant for hypertension, hypercholesterolemia who presents with 1 week episode of cough with shortness of breath and noted to have off and on dysarthria and LUE weakness.  She was initially seen by me as a stroke code for left-sided weakness.  Per history, she was noted to have left-sided weakness when fire department came in and she was noted to have saturations to 82%.  Her symptoms were reported to have resolved with administering oxygen.  On my history taking, patient reports that she has had left-arm weakness with dysarthria that is off and on for the last week and a half. She reports that her current left arm weakness started at 2 PM on 04/20/2020 and has been persistent since. She is past 24 hours from onset of symptoms at the time of presentation. She is clear that this has been persistent since then.  She denies any prior history of strokes, does endorse family history of strokes.  She was a smoker in the past and quit about 23 years ago.  She does not use aspirin at home.  LKW: 1400 on 04/20/20 NIHSS: 3 for dysarthria and LUE drift. MRS: 0 TPA: outside window Thrombectomy: outside window.   ROS   Unable to obtain a detailed ROS due to significant oxygen requirement and dysarthria.  Past History   Past Medical History:  Diagnosis Date  . High cholesterol   . Hypertension    No past surgical history on file. No family history on file. Social History   Socioeconomic History  . Marital status: Single    Spouse name: Not on file  . Number of children: Not on file  . Years of education: Not on file  . Highest education level: Not on file  Occupational History  . Not on file  Tobacco Use  . Smoking status: Never Smoker  Substance and  Sexual Activity  . Alcohol use: No  . Drug use: No  . Sexual activity: Not on file  Other Topics Concern  . Not on file  Social History Narrative  . Not on file   Social Determinants of Health   Financial Resource Strain:   . Difficulty of Paying Living Expenses: Not on file  Food Insecurity:   . Worried About Programme researcher, broadcasting/film/video in the Last Year: Not on file  . Ran Out of Food in the Last Year: Not on file  Transportation Needs:   . Lack of Transportation (Medical): Not on file  . Lack of Transportation (Non-Medical): Not on file  Physical Activity:   . Days of Exercise per Week: Not on file  . Minutes of Exercise per Session: Not on file  Stress:   . Feeling of Stress : Not on file  Social Connections:   . Frequency of Communication with Friends and Family: Not on file  . Frequency of Social Gatherings with Friends and Family: Not on file  . Attends Religious Services: Not on file  . Active Member of Clubs or Organizations: Not on file  . Attends Banker Meetings: Not on file  . Marital Status: Not on file   No Known Allergies  Medications  (Not in a hospital admission)    Vitals   Vitals:   04/21/20 1831 04/21/20 1836 04/21/20 1900  04/21/20 2000  BP: 113/88  132/82   Pulse: 84  80   Resp: (!) 29  (!) 28   Temp: 98.5 F (36.9 C)     TempSrc: Rectal     SpO2: 95%  96% 94%  Weight:  79.8 kg    Height:  5\' 3"  (1.6 m)       Body mass index is 31.18 kg/m.  Physical Exam   General: Laying comfortably in bed; in no acute distress. HENT: Normal oropharynx and mucosa. Normal external appearance of ears and nose. Neck: Supple, no pain or tenderness CV: No JVD. No peripheral edema. Pulmonary: Symmetric Chest rise. Tachypneic. Speaks in short phrases. Abdomen: Soft to touch, non-tender. Ext: No cyanosis, edema, or deformity Skin: No rash. Normal palpation of skin.  Musculoskeletal: Normal digits and nails by inspection. No clubbing.  Neurologic  Examination  Mental status/Cognition: Alert, oriented to self, place, month and year, good attention. Speech/language: Moderately dysarthric speech which is compounded by her SOB, Fluent, comprehension intact, object naming intact. Cranial nerves:   CN II Pupils equal and reactive to light, no VF deficits   CN III,IV,VI EOM intact, no gaze preference or deviation, no nystagmus   CN V normal sensation in V1, V2, and V3 segments bilaterally   CN VII no asymmetry, no nasolabial fold flattening   CN VIII normal hearing to speech   CN IX & X normal palatal elevation, no uvular deviation   CN XI 5/5 head turn and 5/5 shoulder shrug bilaterally   CN XII midline tongue protrusion   Motor:  Muscle bulk: normal, tone normal, pronator drift yes, noted in LUE. tremor intention tremors in LUE. Mvmt Root Nerve  Muscle Right Left Comments  SA C5/6 Ax Deltoid 5 4   EF C5/6 Mc Biceps 5 5   EE C6/7/8 Rad Triceps 5 5   WF C6/7 Med FCR 5 5   WE C7/8 PIN ECU 5 5   F Ab C8/T1 U ADM/FDI 5 5   HF L1/2/3 Fem Illopsoas 5 5   KE L2/3/4 Fem Quad     DF L4/5 D Peron Tib Ant     PF S1/2 Tibial Grc/Sol      Sensation:  Light touch Intact BL   Pin prick    Temperature    Vibration   Proprioception    Coordination/Complex Motor:  - Finger to Nose with some ataxia in the LUE but not out of proportion to the weakness. - Heel to shin intact BL - Gait: deferred given the acuity of the situation.  Labs   CBC:  Recent Labs  Lab 04/21/20 1950  WBC 12.8*  NEUTROABS 9.8*  HGB 12.5  HCT 39.0  MCV 93.3  PLT 213    Basic Metabolic Panel:  Lab Results  Component Value Date   NA 140 03/26/2017   K 3.5 03/26/2017   CO2 22 03/26/2017   GLUCOSE 162 (H) 03/26/2017   BUN 24 (H) 03/26/2017   CREATININE 1.34 (H) 03/26/2017   CALCIUM 9.2 03/26/2017   GFRNONAA 38 (L) 03/26/2017   GFRAA 44 (L) 03/26/2017   Lipid Panel: No results found for: LDLCALC HgbA1c: No results found for: HGBA1C Urine Drug Screen:  No results found for: LABOPIA, COCAINSCRNUR, LABBENZ, AMPHETMU, THCU, LABBARB  Alcohol Level No results found for: [facility]  CTH without contrast: pending.  Impression   Beverly Howell is a 79 y.o. female with PMH significant for  . Her neurologic examination is notable for hypertension, hypercholesterolemia  who presents with 1 week episode of cough with shortness of breath and noted to have off and on dysarthria and LUE weakness, but persistent since 1400 on 04/20/20. She was out of the window for tPA or thrombectomy at the time of evaluation and therefore stroke code was cancelled.  Limited history given her high oxygen requirement and dysarthria but concern for potential minor ischemic stroke vs symptomatic large vessel stenosis given initial waxing and waning of the stroke symptoms. Would stress more on permissive HTN and if there is significant carotid or other large vessel stenosis, then recommend fluids at 171ml/hr for 24 hours along with bolus of 1L.  Recommendations  - Frequent Neuro checks per stroke unit protocol - Recommend brain imaging with MRI Brain without contrast - Recommend Vascular imaging with MRA Angio Head without contrast and US Carotid doppler - Recommend obtaining TTE - Recommend obtaining Lipid panel with LDL - Please start statin if LDL > 70 - Recommend HbA1c - Antithrombotic - I ordered Aspirin 325mg  daily. - Recommend DVT ppx - SBP goal - permissive hypertension first 24 h < 220/110. Hold home meds.  - Recommend Telemetry monitoring for arrythmia - Recommend bedside swallow screen prior to PO intake. - Stroke education booklet - Recommend strict bedrest for 24 hours, can be evaluated by PT/OT afterwards - Recommend SLP eval if she fails bedside swallow evaluation. - Recommend keeping head of bed flat for 24 hours if possible from a respiratory standpoint. ______________________________________________________________________   Thank you for the opportunity to  take part in the care of this patient. If you have any further questions, please contact the neurology consultation attending.  Signed,  [facility] Pager Number [phone] Blinks

## 2020-04-21 NOTE — ED Notes (Signed)
DR Jaci Standard TO CALL DR Dalene Seltzer

## 2020-04-21 NOTE — ED Notes (Signed)
[facility] called to activate Code Stroke on pt.

## 2020-04-21 NOTE — ED Notes (Signed)
MD notified of Pt symptoms for evaltion. Pt seems to wax and wanr but is following commands at present.

## 2020-04-21 NOTE — ED Notes (Signed)
Called daughter, [name], to give update.

## 2020-04-21 NOTE — Code Documentation (Signed)
Responded to Code Stroke called at 2026 for L sided weakness, LSN-2000(This was changed to 1400 on 11/7 after speaking with pt). Per pt, she has been having slurred speech and L sided weakness on and off for the last week and a half. She says that around 1400 yesterday it started again and her L sided has been constantly weak since then. NIH-2 for slurred speech and LUE drift. Pt outside window for acute stroke interventions. Code Stroke cancelled per Dr. Derry Lory. Plan to admit.

## 2020-04-22 ENCOUNTER — Inpatient Hospital Stay (HOSPITAL_COMMUNITY): Payer: Medicare (Managed Care)

## 2020-04-22 ENCOUNTER — Inpatient Hospital Stay (HOSPITAL_COMMUNITY)
Admit: 2020-04-22 | Discharge: 2020-04-22 | Disposition: A | Discharge status: Home / Self Care | Payer: Medicare (Managed Care) | Attending: Internal Medicine | Admitting: Internal Medicine

## 2020-04-22 ENCOUNTER — Encounter (HOSPITAL_COMMUNITY): Payer: Self-pay | Admitting: Internal Medicine

## 2020-04-22 DIAGNOSIS — I639 Cerebral infarction, unspecified: Secondary | ICD-10-CM

## 2020-04-22 DIAGNOSIS — I10 Essential (primary) hypertension: Secondary | ICD-10-CM | POA: Diagnosis present

## 2020-04-22 DIAGNOSIS — R7989 Other specified abnormal findings of blood chemistry: Secondary | ICD-10-CM | POA: Diagnosis not present

## 2020-04-22 DIAGNOSIS — N179 Acute kidney failure, unspecified: Secondary | ICD-10-CM | POA: Diagnosis present

## 2020-04-22 DIAGNOSIS — Z789 Other specified health status: Secondary | ICD-10-CM

## 2020-04-22 DIAGNOSIS — U071 COVID-19: Secondary | ICD-10-CM | POA: Diagnosis present

## 2020-04-22 DIAGNOSIS — J9600 Acute respiratory failure, unspecified whether with hypoxia or hypercapnia: Secondary | ICD-10-CM

## 2020-04-22 LAB — TROPONIN I (HIGH SENSITIVITY)
Troponin I (High Sensitivity): 24 ng/L — ABNORMAL HIGH (ref <18)
Troponin I (High Sensitivity): 21 ng/L — ABNORMAL HIGH (ref <18)
Troponin I (High Sensitivity): 18 ng/L — ABNORMAL HIGH (ref <18)

## 2020-04-22 LAB — URINALYSIS, ROUTINE W REFLEX MICROSCOPIC
Bacteria, UA: NONE SEEN
Bilirubin Urine: NEGATIVE
Glucose, UA: NEGATIVE mg/dL
Hgb urine dipstick: NEGATIVE
Ketones, ur: 5 mg/dL — AB
Leukocytes,Ua: NEGATIVE
Nitrite: NEGATIVE
Protein, ur: 30 mg/dL — AB
Specific Gravity, Urine: 1.026 (ref 1.005–1.030)
pH: 5 (ref 5.0–8.0)

## 2020-04-22 LAB — CBG MONITORING, ED: Glucose-Capillary: 133 mg/dL — ABNORMAL HIGH (ref 70–99)

## 2020-04-22 LAB — HEMOGLOBIN A1C
Hgb A1c MFr Bld: 5.5 % (ref 4.8–5.6)
Mean Plasma Glucose: 111.15 mg/dL

## 2020-04-22 LAB — LIPID PANEL
Cholesterol: 158 mg/dL (ref 0–200)
HDL: 38 mg/dL — ABNORMAL LOW (ref >40)
LDL Cholesterol: 93 mg/dL (ref 0–99)
Total CHOL/HDL Ratio: 4.2 RATIO
Triglycerides: 135 mg/dL (ref <150)
VLDL: 27 mg/dL (ref 0–40)

## 2020-04-22 LAB — D-DIMER, QUANTITATIVE: D-Dimer, Quant: >20 ug/mL-FEU — ABNORMAL HIGH (ref 0.00–0.50)

## 2020-04-22 LAB — RAPID URINE DRUG SCREEN, HOSP PERFORMED
Amphetamines: NOT DETECTED
Barbiturates: NOT DETECTED
Benzodiazepines: NOT DETECTED
Cocaine: NOT DETECTED
Opiates: NOT DETECTED
Tetrahydrocannabinol: NOT DETECTED

## 2020-04-22 LAB — CBC
HCT: 35.4 % — ABNORMAL LOW (ref 36.0–46.0)
Hemoglobin: 11.3 g/dL — ABNORMAL LOW (ref 12.0–15.0)
MCH: 30.1 pg (ref 26.0–34.0)
MCHC: 31.9 g/dL (ref 30.0–36.0)
MCV: 94.4 fL (ref 80.0–100.0)
Platelets: 167 10*3/uL (ref 150–400)
RBC: 3.75 MIL/uL — ABNORMAL LOW (ref 3.87–5.11)
RDW: 13.6 % (ref 11.5–15.5)
WBC: 12.4 10*3/uL — ABNORMAL HIGH (ref 4.0–10.5)
nRBC: 0 % (ref 0.0–0.2)

## 2020-04-22 LAB — CREATININE, SERUM
Creatinine, Ser: 1.78 mg/dL — ABNORMAL HIGH (ref 0.44–1.00)
GFR, Estimated: 29 mL/min — ABNORMAL LOW (ref >60)

## 2020-04-22 LAB — HEPARIN LEVEL (UNFRACTIONATED): Heparin Unfractionated: 0.63 IU/mL (ref 0.30–0.70)

## 2020-04-22 LAB — RESPIRATORY PANEL BY RT PCR (FLU A&B, COVID)
Influenza A by PCR: NEGATIVE
Influenza B by PCR: NEGATIVE
SARS Coronavirus 2 by RT PCR: POSITIVE — AB

## 2020-04-22 LAB — GLUCOSE, CAPILLARY: Glucose-Capillary: 97 mg/dL (ref 70–99)

## 2020-04-22 LAB — C-REACTIVE PROTEIN: CRP: 24.2 mg/dL — ABNORMAL HIGH (ref <1.0)

## 2020-04-22 MED ORDER — METHYLPREDNISOLONE SODIUM SUCC 40 MG IJ SOLR
0.5000 mg/kg | Freq: Two times a day (BID) | INTRAMUSCULAR | Status: DC
  Administered 2020-04-22 – 2020-04-23 (×3): 40 mg via INTRAVENOUS
  Filled 2020-04-22 (×3): qty 1

## 2020-04-22 MED ORDER — ASPIRIN 300 MG RE SUPP
300.0000 mg | Freq: Every day | RECTAL | Status: DC
  Administered 2020-04-22 – 2020-04-23 (×2): 300 mg via RECTAL
  Filled 2020-04-22 (×2): qty 1

## 2020-04-22 MED ORDER — ASPIRIN 325 MG PO TABS: 325.0000 mg | ORAL_TABLET | Freq: Every day | ORAL | Status: DC

## 2020-04-22 MED ORDER — ACETAMINOPHEN 325 MG PO TABS
650.0000 mg | ORAL_TABLET | ORAL | Status: DC | PRN
  Administered 2020-04-27 – 2020-04-28 (×2): 650 mg via ORAL
  Filled 2020-04-22 (×2): qty 2

## 2020-04-22 MED ORDER — HEPARIN (PORCINE) 25000 UT/250ML-% IV SOLN
900.0000 [IU]/h | INTRAVENOUS | Status: DC
  Administered 2020-04-22: 1000 [IU]/h via INTRAVENOUS
  Administered 2020-04-23: 900 [IU]/h via INTRAVENOUS
  Filled 2020-04-22: qty 250

## 2020-04-22 MED ORDER — ACETAMINOPHEN 650 MG RE SUPP: 650.0000 mg | RECTAL | Status: DC | PRN

## 2020-04-22 MED ORDER — PANTOPRAZOLE SODIUM 40 MG IV SOLR
40.0000 mg | Freq: Every day | INTRAVENOUS | Status: DC
  Administered 2020-04-22 – 2020-04-23 (×2): 40 mg via INTRAVENOUS
  Filled 2020-04-22 (×2): qty 40

## 2020-04-22 MED ORDER — ACETAMINOPHEN 160 MG/5ML PO SOLN: 650.0000 mg | ORAL | Status: DC | PRN

## 2020-04-22 MED ORDER — STROKE: EARLY STAGES OF RECOVERY BOOK
Freq: Once | Status: AC
  Filled 2020-04-22 (×2): qty 1

## 2020-04-22 MED ORDER — TOCILIZUMAB 400 MG/20ML IV SOLN
650.0000 mg | Freq: Once | INTRAVENOUS | Status: AC
  Administered 2020-04-22: 650 mg via INTRAVENOUS
  Filled 2020-04-22: qty 32.5

## 2020-04-22 MED ORDER — SODIUM CHLORIDE 0.9 % IV SOLN
200.0000 mg | Freq: Once | INTRAVENOUS | Status: AC
  Administered 2020-04-22: 200 mg via INTRAVENOUS
  Filled 2020-04-22: qty 40

## 2020-04-22 MED ORDER — SODIUM CHLORIDE 0.9 % IV SOLN
100.0000 mg | Freq: Every day | INTRAVENOUS | Status: AC
  Administered 2020-04-23 – 2020-04-26 (×4): 100 mg via INTRAVENOUS
  Filled 2020-04-22 (×4): qty 20

## 2020-04-22 MED ORDER — SODIUM CHLORIDE 0.9 % IV SOLN: INTRAVENOUS | Status: DC

## 2020-04-22 MED ORDER — SODIUM CHLORIDE 0.9 % IV SOLN: INTRAVENOUS | Status: AC

## 2020-04-22 MED ORDER — PREDNISONE 20 MG PO TABS: 50.0000 mg | ORAL_TABLET | Freq: Every day | ORAL | Status: DC

## 2020-04-22 MED ORDER — ENOXAPARIN SODIUM 30 MG/0.3ML ~~LOC~~ SOLN
30.0000 mg | SUBCUTANEOUS | Status: DC
  Administered 2020-04-22: 30 mg via SUBCUTANEOUS
  Filled 2020-04-22: qty 0.3

## 2020-04-22 MED ORDER — HYDRALAZINE HCL 20 MG/ML IJ SOLN: 10.0000 mg | INTRAMUSCULAR | Status: DC | PRN

## 2020-04-22 NOTE — ED Notes (Signed)
Patient's daughter [name] updated on plan of care.

## 2020-04-22 NOTE — ED Notes (Signed)
Patient called out stating she needed to urinate, discussed purewick with patient to which she was agreeable. Placed on purewick

## 2020-04-22 NOTE — Progress Notes (Addendum)
ANTICOAGULATION CONSULT NOTE  Pharmacy Consult for Heparin Indication: DVT  No Known Allergies  Patient Measurements: Height: 5\' 5"  (165.1 cm) Weight: 83.2 kg (183 lb 6.4 oz) IBW/kg (Calculated) : 57 Heparin Dosing Weight: 69.8 kg  Vital Signs: Temp: 98.4 F (36.9 C) (11/09 2009) Temp Source: Oral (11/09 2009) BP: 158/107 (11/09 2009) Pulse Rate: 84 (11/09 2009)  Labs: Recent Labs    04/21/20 1950 04/21/20 1950 04/21/20 2117 04/21/20 2319 04/22/20 0235 04/22/20 0500 04/22/20 2148  HGB 12.5   < > 14.6  --  11.3*  --   --   HCT 39.0  --  43.0  --  35.4*  --   --   PLT 213  --   --   --  167  --   --   APTT 27  --   --   --   --   --   --   LABPROT 17.2*  --   --   --   --   --   --   INR 1.5*  --   --   --   --   --   --   HEPARINUNFRC  --   --   --   --   --   --  0.63  CREATININE 1.84*  --  1.70*  --  1.78*  --   --   TROPONINIHS 22*   < >  --  24* 21* 18*  --    < > = values in this interval not displayed.    Estimated Creatinine Clearance: 27.8 mL/min (A) (by C-G formula based on SCr of 1.78 mg/dL (H)).  Assessment: 79 year old female admitted CVA, found to have B DVT, for heparin.  Initial heparin level above tonight; however, level may drop as pt received Lovenox earlier in the day.     Goal of Therapy:  Heparin level 0.3-0.5 units/ml Monitor platelets by anticoagulation protocol: Yes   Plan:  Continue Heparin at current rate for now Follow-up am labs and adjust rate at that time if needed.  70, PharmD, BCPS   Addendum: AM heparin level 0.61   Decrease Heparin 900 units/hr Check heparin level in 8 hours.   Geannie Risen, PharmD, BCPS  04/23/2020 2:40 AM

## 2020-04-22 NOTE — ED Notes (Signed)
Dr. Randol Kern paged to [phone] Percival Spanish, RN paged by Marylene Land

## 2020-04-22 NOTE — Progress Notes (Signed)
STROKE TEAM PROGRESS NOTE   INTERVAL HISTORY I have personally reviewed history of presenting illness, electronic medical records and imaging films in PACS. She presented with left hemiparesis as a code stroke and was found to have low oxygen saturations and has been found to be Covid positive with pneumonia. She has persistent severe dysarthria and mild left upper extremity weakness. Vital signs are stable. MRI scan of the brain shows patchy right MCA embolic infarct and MRA show no large vessel stenosis or occlusion Vitals:   04/22/20 0900 04/22/20 0930 04/22/20 1000 04/22/20 1015  BP: 138/75 (!) 148/109 140/78   Pulse: 76 85 80 80  Resp: (!) 26 (!) 26 (!) 26 (!) 27  Temp:      TempSrc:      SpO2: 100% 100% 100% 100%  Weight:      Height:       CBC:  Recent Labs  Lab 04/21/20 1950 04/21/20 1950 04/21/20 2117 04/22/20 0235  WBC 12.8*  --   --  12.4*  NEUTROABS 9.8*  --   --   --   HGB 12.5   < > 14.6 11.3*  HCT 39.0   < > 43.0 35.4*  MCV 93.3  --   --  94.4  PLT 213  --   --  167   < > = values in this interval not displayed.   Basic Metabolic Panel:  Recent Labs  Lab 04/21/20 1950 04/21/20 1950 04/21/20 2117 04/22/20 0235  NA 135  --  139  --   K 4.8  --  5.6*  --   CL 100  --  102  --   CO2 24  --   --   --   GLUCOSE 124*  --  110*  --   BUN 28*  --  38*  --   CREATININE 1.84*   < > 1.70* 1.78*  CALCIUM 8.8*  --   --   --    < > = values in this interval not displayed.   Lipid Panel:  Recent Labs  Lab 04/22/20 0235  CHOL 158  TRIG 135  HDL 38*  CHOLHDL 4.2  VLDL 27  LDLCALC 93   HgbA1c:  Recent Labs  Lab 04/22/20 0235  HGBA1C 5.5   Urine Drug Screen: No results for input(s): LABOPIA, COCAINSCRNUR, LABBENZ, AMPHETMU, THCU, LABBARB in the last 168 hours.  Alcohol Level  Recent Labs  Lab 04/21/20 1950  ETH <10    IMAGING past 24 hours DG Abd 1 View  Result Date: 04/22/2020 CLINICAL DATA:  MRI clearance.  Altered mental status. EXAM: ABDOMEN -  1 VIEW COMPARISON:  None. FINDINGS: The bowel gas pattern is normal. No radio-opaque calculi or other significant radiographic abnormality are seen. There appear to be hazy airspace opacities involving the lungs bilaterally. No metallic foreign body identified. There are degenerative changes of both hips, right worse than left. There are degenerative changes of the lumbar spine. IMPRESSION: 1. No metallic foreign body identified. 2. Hazy airspace opacities involving the lungs bilaterally. 3. Nonobstructive bowel gas pattern. Electronically Signed   By: Katherine Mantle M.D.   On: 04/22/2020 00:23   CT Head Wo Contrast  Result Date: 04/21/2020 CLINICAL DATA:  79 year old female with intermittent dysarthria and left side weakness x1 week, progressive since 1400 hours on 04/20/2020. Hypoxia on initial presentation. EXAM: CT HEAD WITHOUT CONTRAST TECHNIQUE: Contiguous axial images were obtained from the base of the skull through the vertex without intravenous contrast.  COMPARISON:  None. FINDINGS: Brain: Small chronic infarct in the left cerebellum (series 3, image 8). No acute intracranial hemorrhage identified. No midline shift, mass effect, or evidence of intracranial mass lesion. No ventriculomegaly. Mild for age scattered bilateral white matter hypodensity. Small age indeterminate hypodense area in the posterior right insula on series 3, image 15. No other no acute or subacute cortically based infarct identified. No supratentorial cortical encephalomalacia identified. Deep gray matter nuclei and brainstem appear within normal limits. Vascular: Calcified atherosclerosis at the skull base. No suspicious intracranial vascular hyperdensity. Skull: No acute osseous abnormality identified. Sinuses/Orbits: Small fluid level in the left maxillary sinus. Bubbly opacity there as well as in the right maxillary and sphenoid sinuses. Bubbly opacity in the posterior left ethmoid. Tympanic cavities and mastoids appear  clear. Other: No acute orbit or scalp soft tissue finding identified. IMPRESSION: 1. Small infarct at the posterior right insula, age indeterminate but suspicious for subacute ischemia in this setting. No associated hemorrhage or mass effect. 2. Elsewhere mild for age cerebral white matter changes, and a small chronic left cerebellar infarct. 3. Acute paranasal sinus inflammation. Electronically Signed   By: Odessa Fleming M.D.   On: 04/21/2020 22:56   MR ANGIO HEAD WO CONTRAST  Result Date: 04/22/2020 CLINICAL DATA:  Follow-up examination for acute stroke. EXAM: MRI HEAD WITHOUT CONTRAST MRA HEAD WITHOUT CONTRAST TECHNIQUE: Multiplanar, multiecho pulse sequences of the brain and surrounding structures were obtained without intravenous contrast. Angiographic images of the head were obtained using MRA technique without contrast. COMPARISON:  Prior CT from 04/21/2020. FINDINGS: MRI HEAD FINDINGS Brain: Examination degraded by motion artifact. Generalized age-related cerebral atrophy. Mild chronic microvascular ischemic disease present within the periventricular and deep white matter both cerebral hemispheres. Small remote left cerebellar infarct noted. Patchy restricted diffusion seen involving the posterior right insula as well as the overlying right posterior frontal and parietal lobes, consistent with right MCA territory infarcts. No significant mass effect or associated hemorrhage. An embolic etiology is suspected given distribution. No other diffusion abnormality to suggest acute or subacute ischemia. Gray-white matter differentiation otherwise maintained. No other areas of encephalomalacia to suggest chronic cortical infarction on this motion degraded exam. No other definite foci of susceptibility artifact to suggest acute or chronic intracranial hemorrhage. No mass lesion, midline shift or mass effect. No hydrocephalus or extra-axial fluid collection. No made of a partially empty sella. Midline structures intact.  Vascular: Major intracranial vascular flow voids are maintained. Skull and upper cervical spine: Craniocervical junction within normal limits. Multilevel cervical spondylosis noted within the upper cervical spine without high-grade stenosis. Bone marrow signal intensity normal. No scalp soft tissue abnormality. Sinuses/Orbits: Globes and orbital soft tissues within normal limits. Scattered mucosal thickening noted throughout the ethmoidal air cells and maxillary sinuses. Superimposed air-fluid level noted within the left maxillary sinus. Trace bilateral mastoid effusions. Visualized nasopharynx within normal limits. Inner ear structures grossly normal. Other: None. MRA HEAD FINDINGS ANTERIOR CIRCULATION: Examination somewhat degraded by motion artifact. Visualized distal cervical segments of the internal carotid arteries are widely patent with antegrade flow. Petrous, cavernous, and supraclinoid ICAs demonstrate probable atheromatous irregularity without appreciable hemodynamically significant stenosis. A1 segments patent bilaterally. Normal anterior communicating artery complex. Anterior cerebral arteries patent to their distal aspects without stenosis. No M1 stenosis or occlusion. Normal MCA bifurcations. Distal MCA branches well perfused and fairly symmetric. POSTERIOR CIRCULATION: Vertebral arteries patent to the vertebrobasilar junction without stenosis. Both PICAs patent proximally. Basilar patent to its distal aspect without stenosis. Superior cerebral arteries  patent bilaterally. Right PCA supplied via a hypoplastic right P1 segment and prominent right posterior communicating artery. Fetal type origin left PCA. Both PCAs perfused to their distal aspects without proximal flow-limiting stenosis. No aneurysm. IMPRESSION: MRI HEAD IMPRESSION: 1. 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 An embolic  etiology is suspected. No significant mass effect. 2. Underlying age-related cerebral atrophy with mild chronic small vessel ischemic disease. Small remote left cerebellar infarct. MRA HEAD IMPRESSION: 1. Technically limited exam due to motion artifact. 2. Negative intracranial MRA for large vessel occlusion. No hemodynamically significant or correctable stenosis. 3. Mild atheromatous irregularity about the carotid siphons without significant stenosis. Electronically Signed   By: Rise MuBenjamin  McClintock M.D.   On: 04/22/2020 01:29   MR BRAIN WO CONTRAST  Result Date: 04/22/2020 CLINICAL DATA:  Follow-up examination for acute stroke. EXAM: MRI HEAD WITHOUT CONTRAST MRA HEAD WITHOUT CONTRAST TECHNIQUE: Multiplanar, multiecho pulse sequences of the brain and surrounding structures were obtained without intravenous contrast. Angiographic images of the head were obtained using MRA technique without contrast. COMPARISON:  Prior CT from 04/21/2020. FINDINGS: MRI HEAD FINDINGS Brain: Examination degraded by motion artifact. Generalized age-related cerebral atrophy. Mild chronic microvascular ischemic disease present within the periventricular and deep white matter both cerebral hemispheres. Small remote left cerebellar infarct noted. Patchy restricted diffusion seen involving the posterior right insula as well as the overlying right posterior frontal and parietal lobes, consistent with right MCA territory infarcts. No significant mass effect or associated hemorrhage. An embolic etiology is suspected given distribution. No other diffusion abnormality to suggest acute or subacute ischemia. Gray-white matter differentiation otherwise maintained. No other areas of encephalomalacia to suggest chronic cortical infarction on this motion degraded exam. No other definite foci of susceptibility artifact to suggest acute or chronic intracranial hemorrhage. No mass lesion, midline shift or mass effect. No hydrocephalus or extra-axial  fluid collection. No made of a partially empty sella. Midline structures intact. Vascular: Major intracranial vascular flow voids are maintained. Skull and upper cervical spine: Craniocervical junction within normal limits. Multilevel cervical spondylosis noted within the upper cervical spine without high-grade stenosis. Bone marrow signal intensity normal. No scalp soft tissue abnormality. Sinuses/Orbits: Globes and orbital soft tissues within normal limits. Scattered mucosal thickening noted throughout the ethmoidal air cells and maxillary sinuses. Superimposed air-fluid level noted within the left maxillary sinus. Trace bilateral mastoid effusions. Visualized nasopharynx within normal limits. Inner ear structures grossly normal. Other: None. MRA HEAD FINDINGS ANTERIOR CIRCULATION: Examination somewhat degraded by motion artifact. Visualized distal cervical segments of the internal carotid arteries are widely patent with antegrade flow. Petrous, cavernous, and supraclinoid ICAs demonstrate probable atheromatous irregularity without appreciable hemodynamically significant stenosis. A1 segments patent bilaterally. Normal anterior communicating artery complex. Anterior cerebral arteries patent to their distal aspects without stenosis. No M1 stenosis or occlusion. Normal MCA bifurcations. Distal MCA branches well perfused and fairly symmetric. POSTERIOR CIRCULATION: Vertebral arteries patent to the vertebrobasilar junction without stenosis. Both PICAs patent proximally. Basilar patent to its distal aspect without stenosis. Superior cerebral arteries patent bilaterally. Right PCA supplied via a hypoplastic right P1 segment and prominent right posterior communicating artery. Fetal type origin left PCA. Both PCAs perfused to their distal aspects without proximal flow-limiting stenosis. No aneurysm. IMPRESSION: MRI HEAD IMPRESSION: 1. Patchy small volume acute ischemic nonhemorrhagic right MCA territory infarcts involving  the posterior right insula and overlying right frontoparietal region. An embolic etiology is suspected. No significant mass effect.  2. Underlying age-related cerebral atrophy with mild chronic small vessel ischemic disease. Small remote left cerebellar infarct. MRA HEAD IMPRESSION: 1. Technically limited exam due to motion artifact. 2. Negative intracranial MRA for large vessel occlusion. No hemodynamically significant or correctable stenosis. 3. Mild atheromatous irregularity about the carotid siphons without significant stenosis. Electronically Signed   By: Rise Mu M.D.   On: 04/22/2020 01:29   DG Chest Portable 1 View  Result Date: 04/21/2020 CLINICAL DATA:  Hypoxia EXAM: PORTABLE CHEST 1 VIEW COMPARISON:  None. FINDINGS: The heart size is enlarged. There are hazy bilateral airspace opacities. There is no pneumothorax. No large pleural effusion. There are end-stage degenerative changes of both glenohumeral joints. IMPRESSION: Cardiomegaly with hazy bilateral airspace opacities concerning for pulmonary edema or an atypical infectious process. Electronically Signed   By: Katherine Mantle M.D.   On: 04/21/2020 20:21    PHYSICAL EXAM Pleasant elderly African American lady in mild respiratory distress on oxygen. . Afebrile. Head is nontraumatic. Neck is supple without bruit.    Cardiac exam no murmur or gallop. Lungs are clear to auscultation. Distal pulses are well felt. Neurological Exam : She is drowsy but arouses easily. She has severe dysarthria and can be understand with some difficulty. Follows commands well. No aphasia. Extraocular movements are full range without nystagmus. She blinks to threat bilaterally. Mild left lower facial weakness. Tongue midline. Motor system exam trace left upper extremity drift with weakness of mostly grip and intrinsic hand muscles and orbits right over left upper extremity. Symmetric lower extremity strength. Sensation appears intact bilaterally. Deep  tendon reflexes symmetric. Plantars are downgoing. Gait not tested. ASSESSMENT/PLAN Ms. Arrin Ishler is a 79 y.o. female with history of HTN presenting with recurrent L sided weakness and dysarthria for 1.5 weeks, requiring NRB mask on ED arrival. COVID +. Elevated troponins.   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 Small vessel disease. 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 Sinus dz.   MRI  Patchy R MCA territory infarcts. Small vessel disease. Atrophy. 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  MRA head no LVO. Mild atherosclerosis B ICAs.  Carotid Doppler  B ICA 1-39% stenosis, VAs antegrade   2D Echo pending   LE dopplers R age indeterminate DVT R femoral, R poplietal, R post tib, R peroneal, R gastrocnemius; L acute DVT popliteal, L post tib, L peroneal  LDL 93  HgbA1c 5.5  VTE prophylaxis - IV Heparin  No antithrombotic prior to admission, now on aspirin 300 mg suppository daily and heparin IV.   Therapy recommendations:  pending   Disposition:  pending   Acute Hypoxic Resp. Failure due to Acute Covid 19 Viral Pneumonitis during the ongoing 2020 Covid 19 Pandemic   On steroids and remdisivir  Acterma on admission   Trending inflammatory markers  Hypertension  Stable . Permissive hypertension (OK if < 220/120) but gradually normalize in 5-7 days . Long-term BP goal normotensive  Hyperlipidemia  Home meds:  Omega 3  LDL 93, goal < 70  Add statin once po access  Continue statin at discharge  Dysphagia . Secondary to stroke . NPO . Speech on board   Other Stroke Risk Factors  Advanced Age >/= 80   Former smoker, quit 23 years ago  Obesity, Body mass index is 31.18 kg/m., BMI >/= 30 associated with increased stroke risk, recommend weight  loss, diet and exercise as appropriate    COVID  Other Active Problems  ARF  Elevated troponins  Hospital day # 1 She has presented with what appears to be embolic right MCA infarct in the setting of acute DVT and Covid positive state. Recommend I V heparin for anticoagulation till patient is able to swallow and then switch to NOAC. Continue ongoing stroke work-up. Physical occupational speech therapy consults. Swallow eval by speech therapy. Aggressive risk factor modification. Continue treatment for Covid positive state as per primary team. Discussed with patient and Dr. Randol Kern. Greater than 50% time during this 35-minute visit was spent on counseling and coordination of care about embolic stroke and Covid positive state and acute DVT treatment discussion and answering questions. Delia Heady, MD To contact Stroke Continuity provider, please refer to [email or web address]. After hours, contact General Neurology

## 2020-04-22 NOTE — CV Procedure (Addendum)
2D echo attempted, but IV team is in with patient. Will try later

## 2020-04-22 NOTE — ED Notes (Signed)
Vascular at bedside to perform doppler study

## 2020-04-22 NOTE — Progress Notes (Addendum)
ANTICOAGULATION CONSULT NOTE - Initial Consult  Pharmacy Consult for Heparin Indication: DVT  No Known Allergies  Patient Measurements: Height: 5\' 3"  (160 cm) Weight: 79.8 kg (176 lb) IBW/kg (Calculated) : 52.4 Heparin Dosing Weight: 69.8 kg  Vital Signs: Temp: 98.2 F (36.8 C) (11/09 0200) Temp Source: Oral (11/09 0200) BP: 156/96 (11/09 1200) Pulse Rate: 106 (11/09 1200)  Labs: Recent Labs    04/21/20 1950 04/21/20 1950 04/21/20 2117 04/21/20 2319 04/22/20 0235 04/22/20 0500  HGB 12.5   < > 14.6  --  11.3*  --   HCT 39.0  --  43.0  --  35.4*  --   PLT 213  --   --   --  167  --   APTT 27  --   --   --   --   --   LABPROT 17.2*  --   --   --   --   --   INR 1.5*  --   --   --   --   --   CREATININE 1.84*  --  1.70*  --  1.78*  --   TROPONINIHS 22*   < >  --  24* 21* 18*   < > = values in this interval not displayed.    Estimated Creatinine Clearance: 26.1 mL/min (A) (by C-G formula based on SCr of 1.78 mg/dL (H)).   Medical History: Past Medical History:  Diagnosis Date  . High cholesterol   . Hypertension     Assessment: 79 year old female presented with left sided weakness found to have acute CVA and with acute respiratory failure with COVID infection, found to have DVT. Received prophylactic Lovenox dose today at 1154. No heparin bolus due to acute CVA. Will start heparin now at conservative dose with lower goal for heparin level.  Goal of Therapy:  Heparin level 0.3-0.5 units/ml Monitor platelets by anticoagulation protocol: Yes   Plan:  Start heparin infusion at 1000 units/hr 8 hour heparin level Monitor daily CBC, s/sx bleeding  70, PharmD PGY1 Pharmacy Resident 04/22/2020 1:29 PM  Please check [email or web address] for unit-specific pharmacy phone numbers.

## 2020-04-22 NOTE — Progress Notes (Signed)
Carotid artery duplex and bilateral lower extremity venous duplex has been completed. Preliminary results can be found in CV Proc through chart review.  Results were given to the patient's nurse, Alana.  04/22/20 12:17 PM Olen Cordial RVT

## 2020-04-22 NOTE — CV Procedure (Signed)
2D echo attempted. IV team in room. Will try later

## 2020-04-22 NOTE — Progress Notes (Signed)
PROGRESS NOTE                                                                             PROGRESS NOTE                                                                                                                                                                                                             Patient Demographics:    Beverly Howell, is a 79 y.o. female, DOB - 02/04/1941, ZOX:[id number]  Outpatient Primary MD for the patient is System, Provider Not In    LOS - 1  Admit date - 04/21/2020    Chief Complaint  Patient presents with  . Chest Pain       Brief Narrative    This is a no charge note as patient was seen and admitted earlier today by Dr. Toniann FailKakrakandy, her chart, imaging, labs were reviewed, her case was discussed with consultants.  HPI: Beverly FractionYvonne Howell is a 79 y.o. female with history of hypertension has been experiencing left-sided weakness with dysarthria off and on for the last week and a half.  Since November 7 about 2 days ago patient's left upper extremity weakness became more persistent.  At this point patient was brought to the ER.  EMS on arrival was found patient to be requiring 100% nonrebreather to maintain sats.  Patient states she has been doing poorly for the last week and a half.  Denies chest pain nausea vomiting diarrhea.  Has been using a cane recently to ambulate.  ED Course: Initially was brought as a code stroke to the ER.  Neurologist on-call was consulted.  Since patient was out of the window for TPA patient admitted for further work-up for acute CVA.  On exam patient is weak on the left upper and lower extremity and has dysarthria.  MRI brain shows right MCA stroke.  Patient chest x-ray shows bilateral infiltrates with patient requiring 100% nonrebreather on oxygen to maintain sats.  Labs are significant for EKG showing mild prolonged QTC and creatinine  is around 1.8.  Mild leukocytosis.  High sensitive troponins were  22 and 24.    Subjective:    Gary Bultman today severe dysarthria, unable to provide any reliable complaints, she failed her swallow evaluation earlier today.     Assessment  & Plan :    Principal Problem:   Acute CVA (cerebrovascular accident) (HCC) Active Problems:   Acute respiratory failure due to COVID-19 [facility])   ARF (acute renal failure) (HCC)   Essential hypertension  Acute Hypoxic Resp. Failure due to Acute Covid 19 Viral Pneumonitis during the ongoing 2020 Covid 19 Pandemic  -Patient is very poor historian, and with significant dysarthria, but so far she denies she received any COVID-19 vaccine, I was unable to contact any family members to confirm her vaccination status.. -Remains on 15 L NRB, she was encouraged use incentive spirometry, flutter valve. -Continue with IV steroids. -Continue with IV remdesivir. -He received Actemra on admission. -Continue to trend inflammatory markers specially with significantly elevated D-dimers and CRP.  Encouraged the patient to sit up in chair in the daytime use I-S and flutter valve for pulmonary toiletry and then prone in bed when at night.  Will advance activity and titrate down oxygen as possible.     SpO2: 100 % O2 Flow Rate (L/min): 15 L/min  Recent Labs  Lab 04/21/20 1841 04/21/20 1950 04/22/20 0235  WBC  --  12.8* 12.4*  PLT  --  213 167  CRP  --   --  24.2*  BNP  --  123.6*  --   DDIMER  --   --  >20.00*  AST  --  31  --   ALT  --  16  --   ALKPHOS  --  58  --   BILITOT  --  1.3*  --   ALBUMIN  --  2.6*  --   INR  --  1.5*  --   SARSCOV2NAA POSITIVE*  --   --        ABG     Component Value Date/Time   TCO2 27 04/21/2020 2117     Elevated  D-dimer/acute DVT -Patient's D-dimer significantly elevated, venous Doppler was obtained with evidence of bilateral DVT, as well as high clinical concern for acute PE, but I will hold on ordering CTA chest given her AKI, as well no change in management. -Discussed  with neurology, safest current option in the setting of acute CVA is heparin drip, which will be started with no bolus.  Acute CVA with left-sided hemiplegia  -appreciate neurology consult.  Patient failed swallow evaluation so speech therapy has been consulted.  Allow for permissive hypertension.  Patient is placed on aspirin.  Check hemoglobin A1c lipid panel 2D echo and carotid Doppler.  Neurochecks.  Hypertension  -we will allow for permissive hypertension.  As needed IV hydralazine for systolic blood pressure more than 220 and diastolic more than 120.  Acute renal failure  -with last labs available in our system was in 2018.  We will gently hydrate and follow metabolic panel.   Condition - Extremely Guarded  Family Communication  : Tried to reach her daughter couple times with no answer  Code Status :  Full  Consults  :  neurology  Procedures  :  none  PUD Prophylaxis : protonix  Disposition Plan  :    Status is: Inpatient  Remains inpatient appropriate because:Hemodynamically unstable and IV treatments appropriate due to intensity of illness or inability to take PO  Dispo: The patient is from: Home              Anticipated d/c is to: to e determined              Anticipated d/c date is: > 3 days              Patient currently is not medically stable to d/c.      DVT Prophylaxis  : Heparin GTT  Lab Results  Component Value Date   PLT 167 04/22/2020    Diet :  Diet Order            Diet NPO time specified  Diet effective now                  Inpatient Medications  Scheduled Meds: . aspirin  300 mg Rectal Daily   Or  . aspirin  325 mg Oral Daily  . methylPREDNISolone (SOLU-MEDROL) injection  0.5 mg/kg Intravenous Q12H   Followed by  . [START ON 04/25/2020] predniSONE  50 mg Oral Daily  . pantoprazole (PROTONIX) IV  40 mg Intravenous Daily   Continuous Infusions: . sodium chloride 100 mL/hr at 04/22/20 1152  . heparin 1,000 Units/hr (04/22/20  1353)  . [START ON 04/23/2020] remdesivir 100 mg in NS 100 mL     PRN Meds:.acetaminophen **OR** acetaminophen (TYLENOL) oral liquid 160 mg/5 mL **OR** acetaminophen, hydrALAZINE  Antibiotics  :    Anti-infectives (From admission, onward)   Start     Dose/Rate Route Frequency Ordered Stop   04/23/20 1000  remdesivir 100 mg in sodium chloride 0.9 % 100 mL IVPB       "Followed by" Linked Group Details   100 mg 200 mL/hr over 30 Minutes Intravenous Daily 04/22/20 0233 04/27/20 0959   04/22/20 0400  remdesivir 200 mg in sodium chloride 0.9% 250 mL IVPB       "Followed by" Linked Group Details   200 mg 580 mL/hr over 30 Minutes Intravenous Once 04/22/20 0233 04/22/20 0537      Mliss Fritz Klaudia Beirne M.D on 04/22/2020 at 2:55 PM  To page go to [email or web address]   [facility] -  Office  [phone]      Objective:   Vitals:   04/22/20 1200 04/22/20 1300 04/22/20 1349 04/22/20 1400  BP: (!) 156/96 (!) 119/108 (!) 119/108 (!) 134/104  Pulse: (!) 106 75 82 97  Resp: 17 (!) 27 (!) 30 (!) 36  Temp:      TempSrc:      SpO2: 99% 100% 100% 100%  Weight:      Height:        Wt Readings from Last 3 Encounters:  04/21/20 79.8 kg  03/26/17 86.2 kg  02/03/14 87.5 kg    No intake or output data in the 24 hours ending 04/22/20 1455   Physical Exam  Awake Alert, with severe dysarthria, easily distracted, Symmetrical Chest wall movement, Good air movement bilaterally,scattered rales RRR,No Gallops,Rubs or new Murmurs, No Parasternal Heave +ve B.Sounds, Abd Soft, No tenderness, , No rebound - guarding or rigidity. No Cyanosis, Clubbing or edema, No new Rash or bruise     Data Review:    CBC Recent Labs  Lab 04/21/20 1950 04/21/20 2117 04/22/20 0235  WBC 12.8*  --  12.4*  HGB 12.5 14.6 11.3*  HCT 39.0 43.0 35.4*  PLT 213  --  167  MCV 93.3  --  94.4  MCH 29.9  --  30.1  MCHC 32.1  --  31.9  RDW 13.7  --  13.6  LYMPHSABS 1.7  --   --   MONOABS 1.1*  --   --   EOSABS  0.0  --   --   BASOSABS 0.0  --   --     Recent Labs  Lab 04/21/20 1950 04/21/20 2117 04/22/20 0235  NA 135 139  --   K 4.8 5.6*  --   CL 100 102  --   CO2 24  --   --   GLUCOSE 124* 110*  --   BUN 28* 38*  --   CREATININE 1.84* 1.70* 1.78*  CALCIUM 8.8*  --   --   AST 31  --   --   ALT 16  --   --   ALKPHOS 58  --   --   BILITOT 1.3*  --   --   ALBUMIN 2.6*  --   --   CRP  --   --  24.2*  DDIMER  --   --  >20.00*  INR 1.5*  --   --   HGBA1C  --   --  5.5  BNP 123.6*  --   --     ------------------------------------------------------------------------------------------------------------------ Recent Labs    04/22/20 0235  CHOL 158  HDL 38*  LDLCALC 93  TRIG 161  CHOLHDL 4.2    Lab Results  Component Value Date   HGBA1C 5.5 04/22/2020   ------------------------------------------------------------------------------------------------------------------ No results for input(s): TSH, T4TOTAL, T3FREE, THYROIDAB in the last 72 hours.  Invalid input(s): FREET3  Cardiac Enzymes No results for input(s): CKMB, TROPONINI, MYOGLOBIN in the last 168 hours.  Invalid input(s): CK ------------------------------------------------------------------------------------------------------------------    Component Value Date/Time   BNP 123.6 (H) 04/21/2020 1950    Micro Results Recent Results (from the past 240 hour(s))  Respiratory Panel by RT PCR (Flu A&B, Covid) - Nasopharyngeal Swab     Status: Abnormal   Collection Time: 04/21/20  6:41 PM   Specimen: Nasopharyngeal Swab  Result Value Ref Range Status   SARS Coronavirus 2 by RT PCR POSITIVE (A) NEGATIVE Final    Comment: RESULT CALLED TO, READ BACK BY AND VERIFIED WITH: J FERRAIOLO 04/22/20 AT 0016 SK (NOTE) SARS-CoV-2 target nucleic acids are DETECTED.  SARS-CoV-2 RNA is generally detectable in upper respiratory specimens  during the acute phase of infection. Positive results are indicative of the presence of the  identified virus, but do not rule out bacterial infection or co-infection with other pathogens not detected by the test. Clinical correlation with patient history and other diagnostic information is necessary to determine patient infection status. The expected result is Negative.  Fact Sheet for Patients:  https://www.moore.com/  Fact Sheet for Healthcare Providers: https://www.young.biz/  This test is not yet approved or cleared by the [place] FDA and  has been authorized for detection and/or diagnosis of SARS-CoV-2 by FDA under an Emergency Use Authorization (EUA).  This EUA will remain in effect (meaning this test can be use d) for the duration of  the COVID-19 declaration under Section 564(b)(1) of the Act, 21 U.S.C. section 360bbb-3(b)(1), unless the authorization is terminated or revoked sooner.      Influenza A by PCR NEGATIVE NEGATIVE Final   Influenza B by PCR NEGATIVE NEGATIVE Final    Comment: (NOTE) The Xpert Xpress SARS-CoV-2/FLU/RSV assay is intended as an aid in  the diagnosis of influenza from Nasopharyngeal swab specimens and  should not be used as a sole basis for treatment. Nasal washings  and  aspirates are unacceptable for Xpert Xpress SARS-CoV-2/FLU/RSV  testing.  Fact Sheet for Patients: https://www.moore.com/  Fact Sheet for Healthcare Providers: https://www.young.biz/  This test is not yet approved or cleared by the [place] FDA and  has been authorized for detection and/or diagnosis of SARS-CoV-2 by  FDA under an Emergency Use Authorization (EUA). This EUA will remain  in effect (meaning this test can be used) for the duration of the  Covid-19 declaration under Section 564(b)(1) of the Act, 21  U.S.C. section 360bbb-3(b)(1), unless the authorization is  terminated or revoked. Performed at [facility], [address], [place], [place] [address]   Blood  culture (routine x 2)     Status: None (Preliminary result)   Collection Time: 04/21/20  7:50 PM   Specimen: BLOOD  Result Value Ref Range Status   Specimen Description BLOOD SITE NOT SPECIFIED  Final   Special Requests   Final    BOTTLES DRAWN AEROBIC AND ANAEROBIC Blood Culture adequate volume   Culture   Final    NO GROWTH < 12 HOURS Performed at [facility], [address], [place], [place] [address]    Report Status PENDING  Incomplete  Blood culture (routine x 2)     Status: None (Preliminary result)   Collection Time: 04/21/20  9:10 PM   Specimen: BLOOD  Result Value Ref Range Status   Specimen Description BLOOD SITE NOT SPECIFIED  Final   Special Requests   Final    BOTTLES DRAWN AEROBIC AND ANAEROBIC Blood Culture results may not be optimal due to an inadequate volume of blood received in culture bottles   Culture   Final    NO GROWTH < 12 HOURS Performed at [facility], [address], [place], [place] [address]    Report Status PENDING  Incomplete    Radiology Reports DG Abd 1 View  Result Date: 04/22/2020 CLINICAL DATA:  MRI clearance.  Altered mental status. EXAM: ABDOMEN - 1 VIEW COMPARISON:  None. FINDINGS: The bowel gas pattern is normal. No radio-opaque calculi or other significant radiographic abnormality are seen. There appear to be hazy airspace opacities involving the lungs bilaterally. No metallic foreign body identified. There are degenerative changes of both hips, right worse than left. There are degenerative changes of the lumbar spine. IMPRESSION: 1. No metallic foreign body identified. 2. Hazy airspace opacities involving the lungs bilaterally. 3. Nonobstructive bowel gas pattern. Electronically Signed   By: Katherine Mantle M.D.   On: 04/22/2020 00:23   CT Head Wo Contrast  Result Date: 04/21/2020 CLINICAL DATA:  79 year old female with intermittent dysarthria and left side weakness x1 week, progressive since 1400 hours on 04/20/2020.  Hypoxia on initial presentation. EXAM: CT HEAD WITHOUT CONTRAST TECHNIQUE: Contiguous axial images were obtained from the base of the skull through the vertex without intravenous contrast. COMPARISON:  None. FINDINGS: Brain: Small chronic infarct in the left cerebellum (series 3, image 8). No acute intracranial hemorrhage identified. No midline shift, mass effect, or evidence of intracranial mass lesion. No ventriculomegaly. Mild for age scattered bilateral white matter hypodensity. Small age indeterminate hypodense area in the posterior right insula on series 3, image 15. No other no acute or subacute cortically based infarct identified. No supratentorial cortical encephalomalacia identified. Deep gray matter nuclei and brainstem appear within normal limits. Vascular: Calcified atherosclerosis at the skull base. No suspicious intracranial vascular hyperdensity. Skull: No acute osseous abnormality identified. Sinuses/Orbits: Small fluid level in the left  maxillary sinus. Bubbly opacity there as well as in the right maxillary and sphenoid sinuses. Bubbly opacity in the posterior left ethmoid. Tympanic cavities and mastoids appear clear. Other: No acute orbit or scalp soft tissue finding identified. IMPRESSION: 1. Small infarct at the posterior right insula, age indeterminate but suspicious for subacute ischemia in this setting. No associated hemorrhage or mass effect. 2. Elsewhere mild for age cerebral white matter changes, and a small chronic left cerebellar infarct. 3. Acute paranasal sinus inflammation. Electronically Signed   By: Odessa Fleming M.D.   On: 04/21/2020 22:56   MR ANGIO HEAD WO CONTRAST  Result Date: 04/22/2020 CLINICAL DATA:  Follow-up examination for acute stroke. EXAM: MRI HEAD WITHOUT CONTRAST MRA HEAD WITHOUT CONTRAST TECHNIQUE: Multiplanar, multiecho pulse sequences of the brain and surrounding structures were obtained without intravenous contrast. Angiographic images of the head were obtained  using MRA technique without contrast. COMPARISON:  Prior CT from 04/21/2020. FINDINGS: MRI HEAD FINDINGS Brain: Examination degraded by motion artifact. Generalized age-related cerebral atrophy. Mild chronic microvascular ischemic disease present within the periventricular and deep white matter both cerebral hemispheres. Small remote left cerebellar infarct noted. Patchy restricted diffusion seen involving the posterior right insula as well as the overlying right posterior frontal and parietal lobes, consistent with right MCA territory infarcts. No significant mass effect or associated hemorrhage. An embolic etiology is suspected given distribution. No other diffusion abnormality to suggest acute or subacute ischemia. Gray-white matter differentiation otherwise maintained. No other areas of encephalomalacia to suggest chronic cortical infarction on this motion degraded exam. No other definite foci of susceptibility artifact to suggest acute or chronic intracranial hemorrhage. No mass lesion, midline shift or mass effect. No hydrocephalus or extra-axial fluid collection. No made of a partially empty sella. Midline structures intact. Vascular: Major intracranial vascular flow voids are maintained. Skull and upper cervical spine: Craniocervical junction within normal limits. Multilevel cervical spondylosis noted within the upper cervical spine without high-grade stenosis. Bone marrow signal intensity normal. No scalp soft tissue abnormality. Sinuses/Orbits: Globes and orbital soft tissues within normal limits. Scattered mucosal thickening noted throughout the ethmoidal air cells and maxillary sinuses. Superimposed air-fluid level noted within the left maxillary sinus. Trace bilateral mastoid effusions. Visualized nasopharynx within normal limits. Inner ear structures grossly normal. Other: None. MRA HEAD FINDINGS ANTERIOR CIRCULATION: Examination somewhat degraded by motion artifact. Visualized distal cervical segments  of the internal carotid arteries are widely patent with antegrade flow. Petrous, cavernous, and supraclinoid ICAs demonstrate probable atheromatous irregularity without appreciable hemodynamically significant stenosis. A1 segments patent bilaterally. Normal anterior communicating artery complex. Anterior cerebral arteries patent to their distal aspects without stenosis. No M1 stenosis or occlusion. Normal MCA bifurcations. Distal MCA branches well perfused and fairly symmetric. POSTERIOR CIRCULATION: Vertebral arteries patent to the vertebrobasilar junction without stenosis. Both PICAs patent proximally. Basilar patent to its distal aspect without stenosis. Superior cerebral arteries patent bilaterally. Right PCA supplied via a hypoplastic right P1 segment and prominent right posterior communicating artery. Fetal type origin left PCA. Both PCAs perfused to their distal aspects without proximal flow-limiting stenosis. No aneurysm. IMPRESSION: MRI HEAD IMPRESSION: 1. 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 An embolic etiology is suspected. No significant mass effect. 2. Underlying age-related cerebral atrophy with mild chronic small vessel ischemic disease. Small remote left cerebellar infarct. MRA HEAD IMPRESSION: 1. Technically limited exam due to motion artifact. 2. Negative intracranial MRA for large vessel occlusion. No hemodynamically  significant or correctable stenosis. 3. Mild atheromatous irregularity about the carotid siphons without significant stenosis. Electronically Signed   By: Rise Mu M.D.   On: 04/22/2020 01:29   MR BRAIN WO CONTRAST  Result Date: 04/22/2020 CLINICAL DATA:  Follow-up examination for acute stroke. EXAM: MRI HEAD WITHOUT CONTRAST MRA HEAD WITHOUT CONTRAST TECHNIQUE: Multiplanar, multiecho pulse sequences of the brain and surrounding structures were  obtained without intravenous contrast. Angiographic images of the head were obtained using MRA technique without contrast. COMPARISON:  Prior CT from 04/21/2020. FINDINGS: MRI HEAD FINDINGS Brain: Examination degraded by motion artifact. Generalized age-related cerebral atrophy. Mild chronic microvascular ischemic disease present within the periventricular and deep white matter both cerebral hemispheres. Small remote left cerebellar infarct noted. Patchy restricted diffusion seen involving the posterior right insula as well as the overlying right posterior frontal and parietal lobes, consistent with right MCA territory infarcts. No significant mass effect or associated hemorrhage. An embolic etiology is suspected given distribution. No other diffusion abnormality to suggest acute or subacute ischemia. Gray-white matter differentiation otherwise maintained. No other areas of encephalomalacia to suggest chronic cortical infarction on this motion degraded exam. No other definite foci of susceptibility artifact to suggest acute or chronic intracranial hemorrhage. No mass lesion, midline shift or mass effect. No hydrocephalus or extra-axial fluid collection. No made of a partially empty sella. Midline structures intact. Vascular: Major intracranial vascular flow voids are maintained. Skull and upper cervical spine: Craniocervical junction within normal limits. Multilevel cervical spondylosis noted within the upper cervical spine without high-grade stenosis. Bone marrow signal intensity normal. No scalp soft tissue abnormality. Sinuses/Orbits: Globes and orbital soft tissues within normal limits. Scattered mucosal thickening noted throughout the ethmoidal air cells and maxillary sinuses. Superimposed air-fluid level noted within the left maxillary sinus. Trace bilateral mastoid effusions. Visualized nasopharynx within normal limits. Inner ear structures grossly normal. Other: None. MRA HEAD FINDINGS ANTERIOR CIRCULATION:  Examination somewhat degraded by motion artifact. Visualized distal cervical segments of the internal carotid arteries are widely patent with antegrade flow. Petrous, cavernous, and supraclinoid ICAs demonstrate probable atheromatous irregularity without appreciable hemodynamically significant stenosis. A1 segments patent bilaterally. Normal anterior communicating artery complex. Anterior cerebral arteries patent to their distal aspects without stenosis. No M1 stenosis or occlusion. Normal MCA bifurcations. Distal MCA branches well perfused and fairly symmetric. POSTERIOR CIRCULATION: Vertebral arteries patent to the vertebrobasilar junction without stenosis. Both PICAs patent proximally. Basilar patent to its distal aspect without stenosis. Superior cerebral arteries patent bilaterally. Right PCA supplied via a hypoplastic right P1 segment and prominent right posterior communicating artery. Fetal type origin left PCA. Both PCAs perfused to their distal aspects without proximal flow-limiting stenosis. No aneurysm. IMPRESSION: MRI HEAD IMPRESSION: 1. 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 An embolic etiology is suspected. No significant mass effect. 2. Underlying age-related cerebral atrophy with mild chronic small vessel ischemic disease. Small remote left cerebellar infarct. MRA HEAD IMPRESSION: 1. Technically limited exam due to motion artifact. 2. Negative intracranial MRA for large vessel occlusion. No hemodynamically significant or correctable stenosis. 3. Mild atheromatous irregularity about the carotid siphons without significant stenosis. Electronically Signed   By: Rise Mu M.D.   On: 04/22/2020 01:29   DG Chest Portable 1 View  Result Date: 04/21/2020 CLINICAL DATA:  Hypoxia EXAM: PORTABLE CHEST 1 VIEW COMPARISON:  None. FINDINGS: The heart size is enlarged. There are hazy  bilateral airspace opacities. There is no pneumothorax. No  large pleural effusion. There are end-stage degenerative changes of both glenohumeral joints. IMPRESSION: Cardiomegaly with hazy bilateral airspace opacities concerning for pulmonary edema or an atypical infectious process. Electronically Signed   By: Katherine Mantle M.D.   On: 04/21/2020 20:21   VAS US CAROTID (at [facility] and [facility] only)  Result Date: 04/22/2020 Carotid Arterial Duplex Study Indications:       CVA and DVT positive. Risk Factors:      Hypertension. Other Factors:     COVID 19 positive. Comparison Study:  No prior studies. Performing Technologist: Chanda Busing RVT  Examination Guidelines: A complete evaluation includes B-mode imaging, spectral Doppler, color Doppler, and power Doppler as needed of all accessible portions of each vessel. Bilateral testing is considered an integral part of a complete examination. Limited examinations for reoccurring indications may be performed as noted.  Right Carotid Findings: +----------+--------+--------+--------+-----------------------+--------+           PSV cm/sEDV cm/sStenosisPlaque Description     Comments +----------+--------+--------+--------+-----------------------+--------+ CCA Prox  56      5               smooth and heterogenous         +----------+--------+--------+--------+-----------------------+--------+ CCA Distal49      11              smooth and heterogenous         +----------+--------+--------+--------+-----------------------+--------+ ICA Prox  48      14              smooth and heterogenous         +----------+--------+--------+--------+-----------------------+--------+ ICA Distal87      24                                     tortuous +----------+--------+--------+--------+-----------------------+--------+ ECA       59      6                                               +----------+--------+--------+--------+-----------------------+--------+  +----------+--------+-------+--------+-------------------+           PSV cm/sEDV cmsDescribeArm Pressure (mmHG) +----------+--------+-------+--------+-------------------+ [id number]                                         +----------+--------+-------+--------+-------------------+ +---------+--------+--+--------+--+---------+ VertebralPSV cm/s59EDV cm/s13Antegrade +---------+--------+--+--------+--+---------+  Left Carotid Findings: +----------+--------+--------+--------+-----------------------+--------+           PSV cm/sEDV cm/sStenosisPlaque Description     Comments +----------+--------+--------+--------+-----------------------+--------+ CCA Prox  97      19              smooth and heterogenous         +----------+--------+--------+--------+-----------------------+--------+ CCA Distal49      11              smooth and heterogenous         +----------+--------+--------+--------+-----------------------+--------+ ICA Prox  37      11              smooth and heterogenous         +----------+--------+--------+--------+-----------------------+--------+ ICA Distal59      22  tortuous +----------+--------+--------+--------+-----------------------+--------+ ECA       44      5                                               +----------+--------+--------+--------+-----------------------+--------+ +----------+--------+--------+--------+-------------------+           PSV cm/sEDV cm/sDescribeArm Pressure (mmHG) +----------+--------+--------+--------+-------------------+ [id number]                                          +----------+--------+--------+--------+-------------------+ +---------+--------+--+--------+--+---------+ VertebralPSV cm/s33EDV cm/s10Antegrade +---------+--------+--+--------+--+---------+   Summary: Right Carotid: Velocities in the right ICA are consistent with a 1-39% stenosis. Left Carotid:  Velocities in the left ICA are consistent with a 1-39% stenosis. Vertebrals: Bilateral vertebral arteries demonstrate antegrade flow. *See table(s) above for measurements and observations.  Electronically signed by Delia Heady MD on 04/22/2020 at 1:17:39 PM.    Final    VAS [place] LOWER EXTREMITY VENOUS (DVT)  Result Date: 04/22/2020  Lower Venous DVT Study Indications: Elevated Ddimer.  Risk Factors: COVID 19 positive. Comparison Study: No prior studies. Performing Technologist: Chanda Busing RVT  Examination Guidelines: A complete evaluation includes B-mode imaging, spectral Doppler, color Doppler, and power Doppler as needed of all accessible portions of each vessel. Bilateral testing is considered an integral part of a complete examination. Limited examinations for reoccurring indications may be performed as noted. The reflux portion of the exam is performed with the patient in reverse Trendelenburg.  +---------+---------------+---------+-----------+----------+-----------------+ RIGHT    CompressibilityPhasicitySpontaneityPropertiesThrombus Aging    +---------+---------------+---------+-----------+----------+-----------------+ CFV      Full           Yes      Yes                                    +---------+---------------+---------+-----------+----------+-----------------+ SFJ      Full                                                           +---------+---------------+---------+-----------+----------+-----------------+ FV Prox  Full                                                           +---------+---------------+---------+-----------+----------+-----------------+ FV Mid   Partial        No       No                   Age Indeterminate +---------+---------------+---------+-----------+----------+-----------------+ FV DistalNone           No       No                   Age Indeterminate +---------+---------------+---------+-----------+----------+-----------------+  PFV      Full                                                           +---------+---------------+---------+-----------+----------+-----------------+  POP      None           No       No                   Age Indeterminate +---------+---------------+---------+-----------+----------+-----------------+ PTV      None                                         Age Indeterminate +---------+---------------+---------+-----------+----------+-----------------+ PERO     None                                         Age Indeterminate +---------+---------------+---------+-----------+----------+-----------------+ Gastroc  None                                         Age Indeterminate +---------+---------------+---------+-----------+----------+-----------------+   +---------+---------------+---------+-----------+----------+--------------+ LEFT     CompressibilityPhasicitySpontaneityPropertiesThrombus Aging +---------+---------------+---------+-----------+----------+--------------+ CFV      Full           Yes      Yes                                 +---------+---------------+---------+-----------+----------+--------------+ SFJ      Full                                                        +---------+---------------+---------+-----------+----------+--------------+ FV Prox  Full                                                        +---------+---------------+---------+-----------+----------+--------------+ FV Mid   Full                                                        +---------+---------------+---------+-----------+----------+--------------+ FV DistalFull                                                        +---------+---------------+---------+-----------+----------+--------------+ PFV      Full                                                        +---------+---------------+---------+-----------+----------+--------------+ POP      None            No       No  Acute          +---------+---------------+---------+-----------+----------+--------------+ PTV      None                                         Acute          +---------+---------------+---------+-----------+----------+--------------+ PERO     None                                         Acute          +---------+---------------+---------+-----------+----------+--------------+    Summary: RIGHT: - Findings consistent with age indeterminate deep vein thrombosis involving the right femoral vein, right popliteal vein, right posterior tibial veins, right peroneal veins, and right gastrocnemius veins. - No cystic structure found in the popliteal fossa.  LEFT: - Findings consistent with acute deep vein thrombosis involving the left popliteal vein, left posterior tibial veins, and left peroneal veins. - No cystic structure found in the popliteal fossa.  *See table(s) above for measurements and observations. Electronically signed by Gretta Began MD on 04/22/2020 at 2:50:20 PM.    Final

## 2020-04-22 NOTE — H&P (Signed)
History and Physical    Beverly Howell YNW:[id number] DOB: Feb 25, 1941 DOA: 04/21/2020  PCP: System, Provider Not In  Patient coming from: Home.  Chief Complaint: Left-sided weakness.  HPI: Beverly Howell is a 79 y.o. female with history of hypertension has been experiencing left-sided weakness with dysarthria off and on for the last week and a half.  Since November 7 about 2 days ago patient's left upper extremity weakness became more persistent.  At this point patient was brought to the ER.  EMS on arrival was found patient to be requiring 100% nonrebreather to maintain sats.  Patient states she has been doing poorly for the last week and a half.  Denies chest pain nausea vomiting diarrhea.  Has been using a cane recently to ambulate.  ED Course: Initially was brought as a code stroke to the ER.  Neurologist on-call was consulted.  Since patient was out of the window for TPA patient admitted for further work-up for acute CVA.  On exam patient is weak on the left upper and lower extremity and has dysarthria.  MRI brain shows right MCA stroke.  Patient chest x-ray shows bilateral infiltrates with patient requiring 100% nonrebreather on oxygen to maintain sats.  Labs are significant for EKG showing mild prolonged QTC and creatinine is around 1.8.  Mild leukocytosis.  High sensitive troponins were 22 and 24.  Review of Systems: As per HPI, rest all negative.   Past Medical History:  Diagnosis Date  . High cholesterol   . Hypertension     Past Surgical History:  Procedure Laterality Date  . HAND SURGERY       reports that she has quit smoking. She has never used smokeless tobacco. She reports that she does not drink alcohol and does not use drugs.  No Known Allergies  Family History  Problem Relation Age of Onset  . Stroke Mother     Prior to Admission medications   Medication Sig Start Date End Date Taking? Authorizing Provider  aspirin-acetaminophen-caffeine (EXCEDRIN MIGRAINE)  [phone] MG tablet Take 2 tablets by mouth every 4 (four) hours as needed for headache.   Yes [provider]  Calcium Carb-Cholecalciferol (CALCIUM 600-D PO) Take 1 tablet by mouth daily.   Yes [provider]  cholecalciferol (VITAMIN D) 25 MCG (1000 UNIT) tablet Take 1,000 Units by mouth daily.    Yes [provider]  diltiazem (DILACOR XR) 240 MG 24 hr capsule Take 240 mg by mouth daily.   Yes [provider]  lisinopril (ZESTRIL) 10 MG tablet Take 10 mg by mouth daily. 04/10/20  Yes [provider]  metoprolol (LOPRESSOR) 100 MG tablet Take 100 mg by mouth 2 (two) times daily. Take 0ne tablet in am and 1/2 tablet in pm   Yes [provider]  Omega-3 Fatty Acids (FISH OIL TRIPLE STRENGTH) 1400 MG CAPS Take 1,400 mg by mouth daily.   Yes [provider]    Physical Exam: Constitutional: Moderately built and nourished. Vitals:   04/21/20 2330 04/22/20 0110 04/22/20 0130 04/22/20 0200  BP: 137/84 (!) 124/93 129/82 (!) 119/99  Pulse: (!) 105 96 100 (!) 101  Resp: (!) 47 (!) 33 (!) 30 (!) 30  Temp:    98.2 F (36.8 C)  TempSrc:    Oral  SpO2: 100% 100% 96% 98%  Weight:      Height:       Eyes: Anicteric no pallor. ENMT: No discharge from the ears eyes nose or mouth. Neck: No  mass felt.  No neck rigidity. Respiratory: No rhonchi or crepitations. Cardiovascular: S1-S2 heard. Abdomen: Soft nontender bowel sounds present. Musculoskeletal: No edema. Skin: No rash. Neurologic: Alert awake oriented to time place and person.  Has dysarthria.  No facial asymmetry.  Pupils equal and reacting to light.  Left upper extremity is around 2.5 and left lower extremity around 4 x 5.  Right upper and lower extremities 5 x 5. Psychiatric: Appears normal.  Normal affect.   Labs on Admission: I have personally reviewed following labs and imaging studies  CBC: Recent Labs  Lab 04/21/20 1950 04/21/20 2117  WBC 12.8*  --   NEUTROABS  9.8*  --   HGB 12.5 14.6  HCT 39.0 43.0  MCV 93.3  --   PLT 213  --    Basic Metabolic Panel: Recent Labs  Lab 04/21/20 1950 04/21/20 2117  NA 135 139  K 4.8 5.6*  CL 100 102  CO2 24  --   GLUCOSE 124* 110*  BUN 28* 38*  CREATININE 1.84* 1.70*  CALCIUM 8.8*  --    GFR: Estimated Creatinine Clearance: 27.3 mL/min (A) (by C-G formula based on SCr of 1.7 mg/dL (H)). Liver Function Tests: Recent Labs  Lab 04/21/20 1950  AST 31  ALT 16  ALKPHOS 58  BILITOT 1.3*  PROT 6.8  ALBUMIN 2.6*   No results for input(s): LIPASE, AMYLASE in the last 168 hours. No results for input(s): AMMONIA in the last 168 hours. Coagulation Profile: Recent Labs  Lab 04/21/20 1950  INR 1.5*   Cardiac Enzymes: No results for input(s): CKTOTAL, CKMB, CKMBINDEX, TROPONINI in the last 168 hours. BNP (last 3 results) No results for input(s): PROBNP in the last 8760 hours. HbA1C: No results for input(s): HGBA1C in the last 72 hours. CBG: Recent Labs  Lab 04/21/20 1821 04/21/20 2024  GLUCAP 133* 110*   Lipid Profile: No results for input(s): CHOL, HDL, LDLCALC, TRIG, CHOLHDL, LDLDIRECT in the last 72 hours. Thyroid Function Tests: No results for input(s): TSH, T4TOTAL, FREET4, T3FREE, THYROIDAB in the last 72 hours. Anemia Panel: No results for input(s): VITAMINB12, FOLATE, FERRITIN, TIBC, IRON, RETICCTPCT in the last 72 hours. Urine analysis:    Component Value Date/Time   COLORURINE YELLOW 02/03/2014 2029   APPEARANCEUR CLOUDY (A) 02/03/2014 2029   LABSPEC 1.008 02/03/2014 2029   PHURINE 5.5 02/03/2014 2029   GLUCOSEU NEGATIVE 02/03/2014 2029   HGBUR NEGATIVE 02/03/2014 2029   BILIRUBINUR NEGATIVE 02/03/2014 2029   KETONESUR NEGATIVE 02/03/2014 2029   PROTEINUR NEGATIVE 02/03/2014 2029   UROBILINOGEN 0.2 02/03/2014 2029   NITRITE NEGATIVE 02/03/2014 2029   LEUKOCYTESUR SMALL (A) 02/03/2014 2029   Sepsis Labs: (procalcitonin:4,lacticidven:4) ) Recent Results (from  the past 240 hour(s))  Respiratory Panel by RT PCR (Flu A&B, Covid) - Nasopharyngeal Swab     Status: Abnormal   Collection Time: 04/21/20  6:41 PM   Specimen: Nasopharyngeal Swab  Result Value Ref Range Status   SARS Coronavirus 2 by RT PCR POSITIVE (A) NEGATIVE Final    Comment: RESULT CALLED TO, READ BACK BY AND VERIFIED WITH: J FERRAIOLO 04/22/20 AT 0016 SK (NOTE) SARS-CoV-2 target nucleic acids are DETECTED.  SARS-CoV-2 RNA is generally detectable in upper respiratory specimens  during the acute phase of infection. Positive results are indicative of the presence of the identified virus, but do not rule out bacterial infection or co-infection with other pathogens not detected by the test. Clinical correlation with patient history and other diagnostic information is necessary  to determine patient infection status. The expected result is Negative.  Fact Sheet for Patients:  https://www.moore.com/  Fact Sheet for Healthcare Providers: https://www.young.biz/  This test is not yet approved or cleared by the [place] FDA and  has been authorized for detection and/or diagnosis of SARS-CoV-2 by FDA under an Emergency Use Authorization (EUA).  This EUA will remain in effect (meaning this test can be use d) for the duration of  the COVID-19 declaration under Section 564(b)(1) of the Act, 21 U.S.C. section 360bbb-3(b)(1), unless the authorization is terminated or revoked sooner.      Influenza A by PCR NEGATIVE NEGATIVE Final   Influenza B by PCR NEGATIVE NEGATIVE Final    Comment: (NOTE) The Xpert Xpress SARS-CoV-2/FLU/RSV assay is intended as an aid in  the diagnosis of influenza from Nasopharyngeal swab specimens and  should not be used as a sole basis for treatment. Nasal washings and  aspirates are unacceptable for Xpert Xpress SARS-CoV-2/FLU/RSV  testing.  Fact Sheet for Patients: https://www.moore.com/  Fact  Sheet for Healthcare Providers: https://www.young.biz/  This test is not yet approved or cleared by the [place] FDA and  has been authorized for detection and/or diagnosis of SARS-CoV-2 by  FDA under an Emergency Use Authorization (EUA). This EUA will remain  in effect (meaning this test can be used) for the duration of the  Covid-19 declaration under Section 564(b)(1) of the Act, 21  U.S.C. section 360bbb-3(b)(1), unless the authorization is  terminated or revoked. Performed at [facility], [address], [place], [place] [address]      Radiological Exams on Admission: DG Abd 1 View  Result Date: 04/22/2020 CLINICAL DATA:  MRI clearance.  Altered mental status. EXAM: ABDOMEN - 1 VIEW COMPARISON:  None. FINDINGS: The bowel gas pattern is normal. No radio-opaque calculi or other significant radiographic abnormality are seen. There appear to be hazy airspace opacities involving the lungs bilaterally. No metallic foreign body identified. There are degenerative changes of both hips, right worse than left. There are degenerative changes of the lumbar spine. IMPRESSION: 1. No metallic foreign body identified. 2. Hazy airspace opacities involving the lungs bilaterally. 3. Nonobstructive bowel gas pattern. Electronically Signed   By: Katherine Mantle M.D.   On: 04/22/2020 00:23   CT Head Wo Contrast  Result Date: 04/21/2020 CLINICAL DATA:  79 year old female with intermittent dysarthria and left side weakness x1 week, progressive since 1400 hours on 04/20/2020. Hypoxia on initial presentation. EXAM: CT HEAD WITHOUT CONTRAST TECHNIQUE: Contiguous axial images were obtained from the base of the skull through the vertex without intravenous contrast. COMPARISON:  None. FINDINGS: Brain: Small chronic infarct in the left cerebellum (series 3, image 8). No acute intracranial hemorrhage identified. No midline shift, mass effect, or evidence of intracranial mass lesion. No  ventriculomegaly. Mild for age scattered bilateral white matter hypodensity. Small age indeterminate hypodense area in the posterior right insula on series 3, image 15. No other no acute or subacute cortically based infarct identified. No supratentorial cortical encephalomalacia identified. Deep gray matter nuclei and brainstem appear within normal limits. Vascular: Calcified atherosclerosis at the skull base. No suspicious intracranial vascular hyperdensity. Skull: No acute osseous abnormality identified. Sinuses/Orbits: Small fluid level in the left maxillary sinus. Bubbly opacity there as well as in the right maxillary and sphenoid sinuses. Bubbly opacity in the posterior left ethmoid. Tympanic cavities and mastoids appear clear. Other: No acute orbit or scalp soft tissue finding identified. IMPRESSION: 1. Small infarct at the posterior right  insula, age indeterminate but suspicious for subacute ischemia in this setting. No associated hemorrhage or mass effect. 2. Elsewhere mild for age cerebral white matter changes, and a small chronic left cerebellar infarct. 3. Acute paranasal sinus inflammation. Electronically Signed   By: Odessa Fleming M.D.   On: 04/21/2020 22:56   MR ANGIO HEAD WO CONTRAST  Result Date: 04/22/2020 CLINICAL DATA:  Follow-up examination for acute stroke. EXAM: MRI HEAD WITHOUT CONTRAST MRA HEAD WITHOUT CONTRAST TECHNIQUE: Multiplanar, multiecho pulse sequences of the brain and surrounding structures were obtained without intravenous contrast. Angiographic images of the head were obtained using MRA technique without contrast. COMPARISON:  Prior CT from 04/21/2020. FINDINGS: MRI HEAD FINDINGS Brain: Examination degraded by motion artifact. Generalized age-related cerebral atrophy. Mild chronic microvascular ischemic disease present within the periventricular and deep white matter both cerebral hemispheres. Small remote left cerebellar infarct noted. Patchy restricted diffusion seen involving the  posterior right insula as well as the overlying right posterior frontal and parietal lobes, consistent with right MCA territory infarcts. No significant mass effect or associated hemorrhage. An embolic etiology is suspected given distribution. No other diffusion abnormality to suggest acute or subacute ischemia. Gray-white matter differentiation otherwise maintained. No other areas of encephalomalacia to suggest chronic cortical infarction on this motion degraded exam. No other definite foci of susceptibility artifact to suggest acute or chronic intracranial hemorrhage. No mass lesion, midline shift or mass effect. No hydrocephalus or extra-axial fluid collection. No made of a partially empty sella. Midline structures intact. Vascular: Major intracranial vascular flow voids are maintained. Skull and upper cervical spine: Craniocervical junction within normal limits. Multilevel cervical spondylosis noted within the upper cervical spine without high-grade stenosis. Bone marrow signal intensity normal. No scalp soft tissue abnormality. Sinuses/Orbits: Globes and orbital soft tissues within normal limits. Scattered mucosal thickening noted throughout the ethmoidal air cells and maxillary sinuses. Superimposed air-fluid level noted within the left maxillary sinus. Trace bilateral mastoid effusions. Visualized nasopharynx within normal limits. Inner ear structures grossly normal. Other: None. MRA HEAD FINDINGS ANTERIOR CIRCULATION: Examination somewhat degraded by motion artifact. Visualized distal cervical segments of the internal carotid arteries are widely patent with antegrade flow. Petrous, cavernous, and supraclinoid ICAs demonstrate probable atheromatous irregularity without appreciable hemodynamically significant stenosis. A1 segments patent bilaterally. Normal anterior communicating artery complex. Anterior cerebral arteries patent to their distal aspects without stenosis. No M1 stenosis or occlusion. Normal MCA  bifurcations. Distal MCA branches well perfused and fairly symmetric. POSTERIOR CIRCULATION: Vertebral arteries patent to the vertebrobasilar junction without stenosis. Both PICAs patent proximally. Basilar patent to its distal aspect without stenosis. Superior cerebral arteries patent bilaterally. Right PCA supplied via a hypoplastic right P1 segment and prominent right posterior communicating artery. Fetal type origin left PCA. Both PCAs perfused to their distal aspects without proximal flow-limiting stenosis. No aneurysm. IMPRESSION: MRI HEAD IMPRESSION: 1. 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 An embolic etiology is suspected. No significant mass effect. 2. Underlying age-related cerebral atrophy with mild chronic small vessel ischemic disease. Small remote left cerebellar infarct. MRA HEAD IMPRESSION: 1. Technically limited exam due to motion artifact. 2. Negative intracranial MRA for large vessel occlusion. No hemodynamically significant or correctable stenosis. 3. Mild atheromatous irregularity about the carotid siphons without significant stenosis. Electronically Signed   By: Rise Mu M.D.   On: 04/22/2020 01:29   MR BRAIN WO CONTRAST  Result Date: 04/22/2020 CLINICAL DATA:  Follow-up examination for acute  stroke. EXAM: MRI HEAD WITHOUT CONTRAST MRA HEAD WITHOUT CONTRAST TECHNIQUE: Multiplanar, multiecho pulse sequences of the brain and surrounding structures were obtained without intravenous contrast. Angiographic images of the head were obtained using MRA technique without contrast. COMPARISON:  Prior CT from 04/21/2020. FINDINGS: MRI HEAD FINDINGS Brain: Examination degraded by motion artifact. Generalized age-related cerebral atrophy. Mild chronic microvascular ischemic disease present within the periventricular and deep white matter both cerebral hemispheres. Small  remote left cerebellar infarct noted. Patchy restricted diffusion seen involving the posterior right insula as well as the overlying right posterior frontal and parietal lobes, consistent with right MCA territory infarcts. No significant mass effect or associated hemorrhage. An embolic etiology is suspected given distribution. No other diffusion abnormality to suggest acute or subacute ischemia. Gray-white matter differentiation otherwise maintained. No other areas of encephalomalacia to suggest chronic cortical infarction on this motion degraded exam. No other definite foci of susceptibility artifact to suggest acute or chronic intracranial hemorrhage. No mass lesion, midline shift or mass effect. No hydrocephalus or extra-axial fluid collection. No made of a partially empty sella. Midline structures intact. Vascular: Major intracranial vascular flow voids are maintained. Skull and upper cervical spine: Craniocervical junction within normal limits. Multilevel cervical spondylosis noted within the upper cervical spine without high-grade stenosis. Bone marrow signal intensity normal. No scalp soft tissue abnormality. Sinuses/Orbits: Globes and orbital soft tissues within normal limits. Scattered mucosal thickening noted throughout the ethmoidal air cells and maxillary sinuses. Superimposed air-fluid level noted within the left maxillary sinus. Trace bilateral mastoid effusions. Visualized nasopharynx within normal limits. Inner ear structures grossly normal. Other: None. MRA HEAD FINDINGS ANTERIOR CIRCULATION: Examination somewhat degraded by motion artifact. Visualized distal cervical segments of the internal carotid arteries are widely patent with antegrade flow. Petrous, cavernous, and supraclinoid ICAs demonstrate probable atheromatous irregularity without appreciable hemodynamically significant stenosis. A1 segments patent bilaterally. Normal anterior communicating artery complex. Anterior cerebral arteries  patent to their distal aspects without stenosis. No M1 stenosis or occlusion. Normal MCA bifurcations. Distal MCA branches well perfused and fairly symmetric. POSTERIOR CIRCULATION: Vertebral arteries patent to the vertebrobasilar junction without stenosis. Both PICAs patent proximally. Basilar patent to its distal aspect without stenosis. Superior cerebral arteries patent bilaterally. Right PCA supplied via a hypoplastic right P1 segment and prominent right posterior communicating artery. Fetal type origin left PCA. Both PCAs perfused to their distal aspects without proximal flow-limiting stenosis. No aneurysm. IMPRESSION: MRI HEAD IMPRESSION: 1. 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 An embolic etiology is suspected. No significant mass effect. 2. Underlying age-related cerebral atrophy with mild chronic small vessel ischemic disease. Small remote left cerebellar infarct. MRA HEAD IMPRESSION: 1. Technically limited exam due to motion artifact. 2. Negative intracranial MRA for large vessel occlusion. No hemodynamically significant or correctable stenosis. 3. Mild atheromatous irregularity about the carotid siphons without significant stenosis. Electronically Signed   By: Rise MuBenjamin  McClintock M.D.   On: 04/22/2020 01:29   DG Chest Portable 1 View  Result Date: 04/21/2020 CLINICAL DATA:  Hypoxia EXAM: PORTABLE CHEST 1 VIEW COMPARISON:  None. FINDINGS: The heart size is enlarged. There are hazy bilateral airspace opacities. There is no pneumothorax. No large pleural effusion. There are end-stage degenerative changes of both glenohumeral joints. IMPRESSION: Cardiomegaly with hazy bilateral airspace opacities concerning for pulmonary edema or an atypical infectious process. Electronically Signed   By: Katherine Mantlehristopher  Green M.D.   On: 04/21/2020 20:21    EKG: Independently  reviewed.  Normal sinus  rhythm mildly prolonged QTC around 500 4 ms.  Assessment/Plan Principal Problem:   Acute CVA (cerebrovascular accident) (HCC) Active Problems:   Acute respiratory failure due to COVID-19 [facility])   ARF (acute renal failure) (HCC)   Essential hypertension    1. Acute CVA with left-sided hemiplegia appreciate neurology consult.  Patient failed swallow evaluation so speech therapy has been consulted.  Allow for permissive hypertension.  Patient is placed on aspirin.  Check hemoglobin A1c lipid panel 2D echo and carotid Doppler.  Neurochecks. 2. Acute respiratory failure with hypoxia secondary to COVID-19 infection for which I have discussed with patient and patient has consented to get steroids and remdesivir and if required baricitinib/Actemra.  Will check inflammatory markers and closely monitor respiratory status.  I placed patient on remdesivir and IV Solu-Medrol at this time.  Patient may need baricitinib/Actemra. 3. Hypertension we will allow for permissive hypertension.  As needed IV hydralazine for systolic blood pressure more than 220 and diastolic more than 120. 4. Acute renal failure with last labs available in our system was in 2018.  We will gently hydrate and follow metabolic panel.  Since patient has acute CVA in the setting of acute respiratory failure with Covid infection will need close monitoring for any further worsening in inpatient status.   DVT prophylaxis: Lovenox. Code Status: Full code. Family Communication: We will need to discuss with patient's family. Disposition Plan: Home. Consults called: Neurology. Admission status: Inpatient.   Eduard Clos MD [facility] Pager [phone].  If 7PM-7AM, please contact night-coverage [email or web address] Password [email or web address]  04/22/2020, 2:33 AM

## 2020-04-23 ENCOUNTER — Inpatient Hospital Stay (HOSPITAL_COMMUNITY): Payer: Medicare (Managed Care)

## 2020-04-23 DIAGNOSIS — I6389 Other cerebral infarction: Secondary | ICD-10-CM

## 2020-04-23 DIAGNOSIS — I361 Nonrheumatic tricuspid (valve) insufficiency: Secondary | ICD-10-CM

## 2020-04-23 DIAGNOSIS — I824Y3 Acute embolism and thrombosis of unspecified deep veins of proximal lower extremity, bilateral: Secondary | ICD-10-CM

## 2020-04-23 LAB — CBC WITH DIFFERENTIAL/PLATELET
Abs Immature Granulocytes: 0.13 10*3/uL — ABNORMAL HIGH (ref 0.00–0.07)
Basophils Absolute: 0 10*3/uL (ref 0.0–0.1)
Basophils Relative: 0 %
Eosinophils Absolute: 0 10*3/uL (ref 0.0–0.5)
Eosinophils Relative: 0 %
HCT: 37 % (ref 36.0–46.0)
Hemoglobin: 11.8 g/dL — ABNORMAL LOW (ref 12.0–15.0)
Immature Granulocytes: 1 %
Lymphocytes Relative: 8 %
Lymphs Abs: 1.3 10*3/uL (ref 0.7–4.0)
MCH: 29.9 pg (ref 26.0–34.0)
MCHC: 31.9 g/dL (ref 30.0–36.0)
MCV: 93.9 fL (ref 80.0–100.0)
Monocytes Absolute: 0.5 10*3/uL (ref 0.1–1.0)
Monocytes Relative: 3 %
Neutro Abs: 14.3 10*3/uL — ABNORMAL HIGH (ref 1.7–7.7)
Neutrophils Relative %: 88 %
Platelets: 175 10*3/uL (ref 150–400)
RBC: 3.94 MIL/uL (ref 3.87–5.11)
RDW: 13.7 % (ref 11.5–15.5)
WBC: 16.2 10*3/uL — ABNORMAL HIGH (ref 4.0–10.5)
nRBC: 0 % (ref 0.0–0.2)

## 2020-04-23 LAB — ECHOCARDIOGRAM COMPLETE
AR max vel: 1.49 cm2
AV Area VTI: 1.58 cm2
AV Area mean vel: 1.39 cm2
AV Mean grad: 8.5 mmHg
AV Peak grad: 18.2 mmHg
Ao pk vel: 2.14 m/s
Area-P 1/2: 2.45 cm2
Height: 65 in
S' Lateral: 2.72 cm
Weight: 2934.4 oz

## 2020-04-23 LAB — COMPREHENSIVE METABOLIC PANEL
ALT: 20 U/L (ref 0–44)
AST: 35 U/L (ref 15–41)
Albumin: 2.4 g/dL — ABNORMAL LOW (ref 3.5–5.0)
Alkaline Phosphatase: 63 U/L (ref 38–126)
Anion gap: 15 (ref 5–15)
BUN: 27 mg/dL — ABNORMAL HIGH (ref 8–23)
CO2: 21 mmol/L — ABNORMAL LOW (ref 22–32)
Calcium: 8.2 mg/dL — ABNORMAL LOW (ref 8.9–10.3)
Chloride: 107 mmol/L (ref 98–111)
Creatinine, Ser: 1.42 mg/dL — ABNORMAL HIGH (ref 0.44–1.00)
GFR, Estimated: 38 mL/min — ABNORMAL LOW (ref >60)
Glucose, Bld: 121 mg/dL — ABNORMAL HIGH (ref 70–99)
Potassium: 4.4 mmol/L (ref 3.5–5.1)
Sodium: 143 mmol/L (ref 135–145)
Total Bilirubin: 1 mg/dL (ref 0.3–1.2)
Total Protein: 6.1 g/dL — ABNORMAL LOW (ref 6.5–8.1)

## 2020-04-23 LAB — GLUCOSE, CAPILLARY
Glucose-Capillary: 100 mg/dL — ABNORMAL HIGH (ref 70–99)
Glucose-Capillary: 121 mg/dL — ABNORMAL HIGH (ref 70–99)
Glucose-Capillary: 123 mg/dL — ABNORMAL HIGH (ref 70–99)
Glucose-Capillary: 179 mg/dL — ABNORMAL HIGH (ref 70–99)

## 2020-04-23 LAB — D-DIMER, QUANTITATIVE: D-Dimer, Quant: >20 ug/mL-FEU — ABNORMAL HIGH (ref 0.00–0.50)

## 2020-04-23 LAB — C-REACTIVE PROTEIN: CRP: 25.4 mg/dL — ABNORMAL HIGH (ref <1.0)

## 2020-04-23 LAB — HEPARIN LEVEL (UNFRACTIONATED)
Heparin Unfractionated: 0.61 IU/mL (ref 0.30–0.70)
Heparin Unfractionated: 0.35 IU/mL (ref 0.30–0.70)

## 2020-04-23 LAB — PROCALCITONIN: Procalcitonin: 2.54 ng/mL

## 2020-04-23 MED ORDER — ATORVASTATIN CALCIUM 40 MG PO TABS
40.0000 mg | ORAL_TABLET | Freq: Every day | ORAL | Status: DC
  Administered 2020-04-23 – 2020-04-28 (×6): 40 mg via ORAL
  Filled 2020-04-23 (×6): qty 1

## 2020-04-23 MED ORDER — METHYLPREDNISOLONE SODIUM SUCC 125 MG IJ SOLR
80.0000 mg | Freq: Two times a day (BID) | INTRAMUSCULAR | Status: AC
  Administered 2020-04-23 – 2020-04-24 (×3): 80 mg via INTRAVENOUS
  Filled 2020-04-23 (×3): qty 2

## 2020-04-23 MED ORDER — PREDNISONE 20 MG PO TABS
50.0000 mg | ORAL_TABLET | Freq: Every day | ORAL | Status: DC
  Administered 2020-04-25 – 2020-04-27 (×3): 50 mg via ORAL
  Filled 2020-04-23 (×3): qty 2

## 2020-04-23 MED ORDER — APIXABAN 5 MG PO TABS
10.0000 mg | ORAL_TABLET | Freq: Two times a day (BID) | ORAL | Status: DC
  Administered 2020-04-23 – 2020-04-28 (×10): 10 mg via ORAL
  Filled 2020-04-23 (×10): qty 2

## 2020-04-23 MED ORDER — PANTOPRAZOLE SODIUM 40 MG PO TBEC
40.0000 mg | DELAYED_RELEASE_TABLET | Freq: Every day | ORAL | Status: DC
  Administered 2020-04-24 – 2020-04-28 (×5): 40 mg via ORAL
  Filled 2020-04-23 (×5): qty 1

## 2020-04-23 MED ORDER — APIXABAN 5 MG PO TABS: 5.0000 mg | ORAL_TABLET | Freq: Two times a day (BID) | ORAL | Status: DC

## 2020-04-23 NOTE — Progress Notes (Signed)
°  Echocardiogram 2D Echocardiogram has been performed.  Beverly Howell 04/23/2020, 12:38 PM

## 2020-04-23 NOTE — Evaluation (Signed)
Clinical/Bedside Swallow Evaluation Patient Details  Name: Beverly Howell MRN: [id number] Date of Birth: 04-12-1941  Today's Date: 04/23/2020 Time: SLP Start Time (ACUTE ONLY): 1110 SLP Stop Time (ACUTE ONLY): 1120 SLP Time Calculation (min) (ACUTE ONLY): 10 min  Past Medical History:  Past Medical History:  Diagnosis Date  . High cholesterol   . Hypertension    Past Surgical History:  Past Surgical History:  Procedure Laterality Date  . HAND SURGERY     HPI:  Beverly Howell is a 79 y.o. female with history of hypertension has been experiencing left-sided weakness with dysarthria intermittently over the past week and a half. 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 CXR reveals bilateral infiltrates.    Assessment / Plan / Recommendation Clinical Impression  Pt was seen for BSE and was alert and cooperative. She demonstrates dysarthric speech, but overall intelligibility is only mildly impaired. Oral motor function was WNL. Pt tolerated ice chips, thin liquid, puree, and solids with the apperance of an adequate swallow function. No overt concern for aspiration was noted. Pt demonstrated some impaired proprioception during BSE and will require assist with feeding. Recommend regular diet and thin liquids. SLP Visit Diagnosis: Dysphagia, unspecified (R13.10)    Aspiration Risk  Mild aspiration risk    Diet Recommendation Thin liquid;Regular   Liquid Administration via: Cup;Straw Medication Administration: Whole meds with liquid Supervision: Staff to assist with self feeding    Other  Recommendations Oral Care Recommendations: Oral care BID   Follow up Recommendations        Frequency and Duration            Prognosis        Swallow Study   General HPI: Beverly Howell is a 79 y.o. female with history of hypertension has been experiencing left-sided weakness with dysarthria intermittently  over the past week and a half. 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 CXR reveals bilateral infiltrates.  Type of Study: Bedside Swallow Evaluation Diet Prior to this Study: NPO Temperature Spikes Noted: No Respiratory Status: Nasal cannula History of Recent Intubation: No Behavior/Cognition: Alert;Cooperative;Pleasant mood Oral Cavity Assessment: Within Functional Limits Oral Care Completed by SLP: Recent completion by staff Oral Cavity - Dentition: Missing dentition Vision: Functional for self-feeding Self-Feeding Abilities: Needs assist Patient Positioning: Upright in bed Baseline Vocal Quality: Normal    Oral/Motor/Sensory Function Overall Oral Motor/Sensory Function: Within functional limits   Ice Chips Ice chips: Within functional limits   Thin Liquid Thin Liquid: Impaired Presentation: Cup Pharyngeal  Phase Impairments: Cough - Immediate    Nectar Thick Nectar Thick Liquid: Not tested   Honey Thick Honey Thick Liquid: Not tested   Puree Puree: Within functional limits   Solid     Solid: Within functional limits      Royetta Crochet 04/23/2020,12:20 PM

## 2020-04-23 NOTE — Progress Notes (Signed)
PROGRESS NOTE                                                                                                                                                                                                             Patient Demographics:    Beverly Howell, is a 79 y.o. female, DOB - 09/14/1940, WJX:[id number]  Outpatient Primary MD for the patient is System, Provider Not In   Admit date - 04/21/2020   LOS - 2  Chief Complaint  Patient presents with  . Chest Pain       Brief Narrative: Patient is a 79 y.o. female with PMHx of HTN-who presented with left-sided weakness and shortness of breath-was found to have acute CVA along with severe hypoxic respiratory failure due to COVID-19 pneumonitis.  See below for further details.  COVID-19 vaccinated status: Unvaccinated  Significant Events: 11/8>> presented to [facility] with left-sided weakness due to CVA and severe hypoxia due to COVID-19 pneumonia  Significant studies: 11/8>> chest x-ray: Hazy bilateral airspace opacities 11/8>> CT head: Small infarct in the posterior right insula. 11/9>> MRI brain: Acute ischemia in the right MCA territory 11/9>> MRI brain: No large vessel occlusion 11/9>> carotid Doppler: No significant stenosis 11/9>> lower extremity Doppler: Bilateral lower extremity DVT and multiple veins. 11/9>> A1c: 5.5 11/9>> LDL: 93 11/10>> Echo: EF 60-65%, grade 1 diastolic dysfunction, RV systolic function is normal.  [id number] medications: Steroids: 11/8>> Remdesivir: 11/8>> Actemra: 11/8 x 1  Antibiotics: None  Microbiology data: 11/8 >>blood culture: No growth  Procedures: None  Consults: None  DVT prophylaxis: Heparin infusion   Subjective:    [name] today feels better-has less weakness in her left upper extremity-she was transitioned off 100% NRB to 5 L of HFNC.   Assessment  & Plan :   Acute Hypoxic Resp Failure due to Covid 19  Viral pneumonia: Had severe hypoxemia on initial presentation-treated with Actemra/steroid/Remdesivir-remarkable improvement in the past 24 hours-Down to 5 L of HFNC.  Continue steroids and Remdesivir-continue attempts to slowly titrate down FiO2.  Does not require diuretics-as she does not have any evidence of volume overload.  She did have some mild elevation of procalcitonin level-but no indication of a bacterial infection-repeat procalcitonin level tomorrow.  Fever: afebrile O2 requirements:  SpO2: 97 % O2 Flow Rate (L/min): 4 L/min   COVID-19 Labs: Recent Labs  04/22/20 0235 04/23/20 0136  DDIMER >20.00* >20.00*  CRP 24.2* 25.4*       Component Value Date/Time   BNP 123.6 (H) 04/21/2020 1950    Recent Labs  Lab 04/23/20 1308  PROCALCITON 2.54    Lab Results  Component Value Date   SARSCOV2NAA POSITIVE (A) 04/21/2020    Prone/Incentive Spirometry: encouraged patient to lie prone for 3-4 hours at a time for a total of 16 hours a day, and to encourage incentive spirometry use 3-4/hour.  Bilateral lower extremity DVT: Likely provoked by COVID-19-was on IV heparin-now that she has passed a swallowing evaluation and has been started on regular diet-we will switch to Eliquis.  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  Left-sided weakness has improved-SLP evaluation completed on 11/10-has been started on a diet.  Await PT/OT evaluation.  Spoke with Dr. Priscille Heidelberg MD-okay to stop heparin and start Eliquis-no need for aspirin.  Has been started on high intensity statin.  HTN: BP stable-allow some permissive hypertension for the next few days before restarting antihypertensives.  AKI on CKD stage IIIa: AKI likely hemodynamically mediated-improved with supportive care.  Avoid nephrotoxic agents.   Obesity: Estimated body mass index is 30.52 kg/m as calculated from the  following:   Height as of this encounter: 5\' 5"  (1.651 m).   Weight as of this encounter: 83.2 kg.   GI prophylaxis: PPI  ABG:    Component Value Date/Time   TCO2 27 04/21/2020 2117    Vent Settings: N/A  Condition - Guarded  Family Communication  :  Daughter [name] [phone]) updated over the phone 11/10  Code Status :  Full Code  Diet :  Diet Order            Diet regular Room service appropriate? Yes; Fluid consistency: Thin  Diet effective now                  Disposition Plan  :   Status is: Inpatient  Remains inpatient appropriate because:Inpatient level of care appropriate due to severity of illness   Dispo: The patient is from: Home              Anticipated d/c is to: TBD              Anticipated d/c date is: > 3 days              Patient currently is not medically stable to d/c.   Barriers to discharge: Hypoxia requiring O2 supplementation/complete 5 days of IV Remdesivir  Antimicorbials  :    Anti-infectives (From admission, onward)   Start     Dose/Rate Route Frequency Ordered Stop   04/23/20 1000  remdesivir 100 mg in sodium chloride 0.9 % 100 mL IVPB       "Followed by" Linked Group Details   100 mg 200 mL/hr over 30 Minutes Intravenous Daily 04/22/20 0233 04/27/20 0959   04/22/20 0400  remdesivir 200 mg in sodium chloride 0.9% 250 mL IVPB       "Followed by" Linked Group Details   200 mg 580 mL/hr over 30 Minutes Intravenous Once 04/22/20 0233 04/22/20 0537      Inpatient Medications  Scheduled Meds: . aspirin  300 mg Rectal Daily   Or  . aspirin  325 mg Oral Daily  . atorvastatin  40 mg Oral Daily  . methylPREDNISolone (SOLU-MEDROL) injection  80 mg Intravenous  BID   Followed by  . [START ON 04/25/2020] predniSONE  50 mg Oral Daily  . [START ON 04/24/2020] pantoprazole  40 mg Oral Daily   Continuous Infusions: . sodium chloride 75 mL/hr at 04/23/20 1327  . heparin 900 Units/hr (04/23/20 1332)  . remdesivir 100 mg in NS 100 mL  100 mg (04/23/20 1018)   PRN Meds:.acetaminophen **OR** acetaminophen (TYLENOL) oral liquid 160 mg/5 mL **OR** acetaminophen, hydrALAZINE   Time Spent in minutes 35   See all Orders from today for further details   Jeoffrey Massed M.D on 04/23/2020 at 3:09 PM  To page go to [email or web address] - use universal password  [facility] -  Office  [phone]    Objective:   Vitals:   04/23/20 0521 04/23/20 0800 04/23/20 1000 04/23/20 1028  BP: (!) 177/86     Pulse: 92     Resp: (!) 23     Temp: 97.6 F (36.4 C)     TempSrc: Axillary     SpO2: 98% 100% 100% 97%  Weight:      Height:        Wt Readings from Last 3 Encounters:  04/22/20 83.2 kg  03/26/17 86.2 kg  02/03/14 87.5 kg     Intake/Output Summary (Last 24 hours) at 04/23/2020 1509 Last data filed at 04/23/2020 0630 Gross per 24 hour  Intake 538.65 ml  Output 200 ml  Net 338.65 ml     Physical Exam Gen Exam:Alert awake-not in any distress HEENT:atraumatic, normocephalic Chest: B/L clear to auscultation anteriorly CVS:S1S2 regular Abdomen:soft non tender, non distended Extremities:no edema Neurology: Mild left-sided weakness. Skin: no rash   Data Review:    CBC Recent Labs  Lab 04/21/20 1950 04/21/20 2117 04/22/20 0235 04/23/20 0136  WBC 12.8*  --  12.4* 16.2*  HGB 12.5 14.6 11.3* 11.8*  HCT 39.0 43.0 35.4* 37.0  PLT 213  --  167 175  MCV 93.3  --  94.4 93.9  MCH 29.9  --  30.1 29.9  MCHC 32.1  --  31.9 31.9  RDW 13.7  --  13.6 13.7  LYMPHSABS 1.7  --   --  1.3  MONOABS 1.1*  --   --  0.5  EOSABS 0.0  --   --  0.0  BASOSABS 0.0  --   --  0.0    Chemistries  Recent Labs  Lab 04/21/20 1950 04/21/20 2117 04/22/20 0235 04/23/20 0136  NA 135 139  --  143  K 4.8 5.6*  --  4.4  CL 100 102  --  107  CO2 24  --   --  21*  GLUCOSE 124* 110*  --  121*  BUN 28* 38*  --  27*  CREATININE 1.84* 1.70* 1.78* 1.42*  CALCIUM 8.8*  --   --  8.2*  AST 31  --   --  35  ALT 16  --   --  20   ALKPHOS 58  --   --  63  BILITOT 1.3*  --   --  1.0   ------------------------------------------------------------------------------------------------------------------ Recent Labs    04/22/20 0235  CHOL 158  HDL 38*  LDLCALC 93  TRIG 621  CHOLHDL 4.2    Lab Results  Component Value Date   HGBA1C 5.5 04/22/2020   ------------------------------------------------------------------------------------------------------------------ No results for input(s): TSH, T4TOTAL, T3FREE, THYROIDAB in the last 72 hours.  Invalid input(s): FREET3 ------------------------------------------------------------------------------------------------------------------ No results for input(s): VITAMINB12, FOLATE, FERRITIN, TIBC, IRON, RETICCTPCT in the last 72 hours.  Coagulation profile Recent Labs  Lab 04/21/20 1950  INR 1.5*    Recent Labs    04/22/20 0235 04/23/20 0136  DDIMER >20.00* >20.00*    Cardiac Enzymes No results for input(s): CKMB, TROPONINI, MYOGLOBIN in the last 168 hours.  Invalid input(s): CK ------------------------------------------------------------------------------------------------------------------    Component Value Date/Time   BNP 123.6 (H) 04/21/2020 1950    Micro Results Recent Results (from the past 240 hour(s))  Respiratory Panel by RT PCR (Flu A&B, Covid) - Nasopharyngeal Swab     Status: Abnormal   Collection Time: 04/21/20  6:41 PM   Specimen: Nasopharyngeal Swab  Result Value Ref Range Status   SARS Coronavirus 2 by RT PCR POSITIVE (A) NEGATIVE Final    Comment: RESULT CALLED TO, READ BACK BY AND VERIFIED WITH: J FERRAIOLO 04/22/20 AT 0016 SK (NOTE) SARS-CoV-2 target nucleic acids are DETECTED.  SARS-CoV-2 RNA is generally detectable in upper respiratory specimens  during the acute phase of infection. Positive results are indicative of the presence of the identified virus, but do not rule out bacterial infection or co-infection with other  pathogens not detected by the test. Clinical correlation with patient history and other diagnostic information is necessary to determine patient infection status. The expected result is Negative.  Fact Sheet for Patients:  https://www.moore.com/  Fact Sheet for Healthcare Providers: https://www.young.biz/  This test is not yet approved or cleared by the [place] FDA and  has been authorized for detection and/or diagnosis of SARS-CoV-2 by FDA under an Emergency Use Authorization (EUA).  This EUA will remain in effect (meaning this test can be use d) for the duration of  the COVID-19 declaration under Section 564(b)(1) of the Act, 21 U.S.C. section 360bbb-3(b)(1), unless the authorization is terminated or revoked sooner.      Influenza A by PCR NEGATIVE NEGATIVE Final   Influenza B by PCR NEGATIVE NEGATIVE Final    Comment: (NOTE) The Xpert Xpress SARS-CoV-2/FLU/RSV assay is intended as an aid in  the diagnosis of influenza from Nasopharyngeal swab specimens and  should not be used as a sole basis for treatment. Nasal washings and  aspirates are unacceptable for Xpert Xpress SARS-CoV-2/FLU/RSV  testing.  Fact Sheet for Patients: https://www.moore.com/  Fact Sheet for Healthcare Providers: https://www.young.biz/  This test is not yet approved or cleared by the [place] FDA and  has been authorized for detection and/or diagnosis of SARS-CoV-2 by  FDA under an Emergency Use Authorization (EUA). This EUA will remain  in effect (meaning this test can be used) for the duration of the  Covid-19 declaration under Section 564(b)(1) of the Act, 21  U.S.C. section 360bbb-3(b)(1), unless the authorization is  terminated or revoked. Performed at [facility], [address], [place], [place] [address]   Blood culture (routine x 2)     Status: None (Preliminary result)   Collection Time:  04/21/20  7:50 PM   Specimen: BLOOD  Result Value Ref Range Status   Specimen Description BLOOD SITE NOT SPECIFIED  Final   Special Requests   Final    BOTTLES DRAWN AEROBIC AND ANAEROBIC Blood Culture adequate volume   Culture   Final    NO GROWTH 2 DAYS Performed at [facility], [address] [place], [place] [address]    Report Status PENDING  Incomplete  Blood culture (routine x 2)     Status: None (Preliminary result)   Collection Time: 04/21/20  9:10 PM   Specimen: BLOOD  Result Value  Ref Range Status   Specimen Description BLOOD SITE NOT SPECIFIED  Final   Special Requests   Final    BOTTLES DRAWN AEROBIC AND ANAEROBIC Blood Culture results may not be optimal due to an inadequate volume of blood received in culture bottles   Culture   Final    NO GROWTH 2 DAYS Performed at [facility], [address] [place], [place] [address]    Report Status PENDING  Incomplete    Radiology Reports DG Abd 1 View  Result Date: 04/22/2020 CLINICAL DATA:  MRI clearance.  Altered mental status. EXAM: ABDOMEN - 1 VIEW COMPARISON:  None. FINDINGS: The bowel gas pattern is normal. No radio-opaque calculi or other significant radiographic abnormality are seen. There appear to be hazy airspace opacities involving the lungs bilaterally. No metallic foreign body identified. There are degenerative changes of both hips, right worse than left. There are degenerative changes of the lumbar spine. IMPRESSION: 1. No metallic foreign body identified. 2. Hazy airspace opacities involving the lungs bilaterally. 3. Nonobstructive bowel gas pattern. Electronically Signed   By: Katherine Mantle M.D.   On: 04/22/2020 00:23   CT Head Wo Contrast  Result Date: 04/21/2020 CLINICAL DATA:  79 year old female with intermittent dysarthria and left side weakness x1 week, progressive since 1400 hours on 04/20/2020. Hypoxia on initial presentation. EXAM: CT HEAD WITHOUT CONTRAST TECHNIQUE: Contiguous axial  images were obtained from the base of the skull through the vertex without intravenous contrast. COMPARISON:  None. FINDINGS: Brain: Small chronic infarct in the left cerebellum (series 3, image 8). No acute intracranial hemorrhage identified. No midline shift, mass effect, or evidence of intracranial mass lesion. No ventriculomegaly. Mild for age scattered bilateral white matter hypodensity. Small age indeterminate hypodense area in the posterior right insula on series 3, image 15. No other no acute or subacute cortically based infarct identified. No supratentorial cortical encephalomalacia identified. Deep gray matter nuclei and brainstem appear within normal limits. Vascular: Calcified atherosclerosis at the skull base. No suspicious intracranial vascular hyperdensity. Skull: No acute osseous abnormality identified. Sinuses/Orbits: Small fluid level in the left maxillary sinus. Bubbly opacity there as well as in the right maxillary and sphenoid sinuses. Bubbly opacity in the posterior left ethmoid. Tympanic cavities and mastoids appear clear. Other: No acute orbit or scalp soft tissue finding identified. IMPRESSION: 1. Small infarct at the posterior right insula, age indeterminate but suspicious for subacute ischemia in this setting. No associated hemorrhage or mass effect. 2. Elsewhere mild for age cerebral white matter changes, and a small chronic left cerebellar infarct. 3. Acute paranasal sinus inflammation. Electronically Signed   By: Odessa Fleming M.D.   On: 04/21/2020 22:56   MR ANGIO HEAD WO CONTRAST  Result Date: 04/22/2020 CLINICAL DATA:  Follow-up examination for acute stroke. EXAM: MRI HEAD WITHOUT CONTRAST MRA HEAD WITHOUT CONTRAST TECHNIQUE: Multiplanar, multiecho pulse sequences of the brain and surrounding structures were obtained without intravenous contrast. Angiographic images of the head were obtained using MRA technique without contrast. COMPARISON:  Prior CT from 04/21/2020. FINDINGS: MRI HEAD  FINDINGS Brain: Examination degraded by motion artifact. Generalized age-related cerebral atrophy. Mild chronic microvascular ischemic disease present within the periventricular and deep white matter both cerebral hemispheres. Small remote left cerebellar infarct noted. Patchy restricted diffusion seen involving the posterior right insula as well as the overlying right posterior frontal and parietal lobes, consistent with right MCA territory infarcts. No significant mass effect or associated hemorrhage. An embolic etiology is suspected given distribution. No other  diffusion abnormality to suggest acute or subacute ischemia. Gray-white matter differentiation otherwise maintained. No other areas of encephalomalacia to suggest chronic cortical infarction on this motion degraded exam. No other definite foci of susceptibility artifact to suggest acute or chronic intracranial hemorrhage. No mass lesion, midline shift or mass effect. No hydrocephalus or extra-axial fluid collection. No made of a partially empty sella. Midline structures intact. Vascular: Major intracranial vascular flow voids are maintained. Skull and upper cervical spine: Craniocervical junction within normal limits. Multilevel cervical spondylosis noted within the upper cervical spine without high-grade stenosis. Bone marrow signal intensity normal. No scalp soft tissue abnormality. Sinuses/Orbits: Globes and orbital soft tissues within normal limits. Scattered mucosal thickening noted throughout the ethmoidal air cells and maxillary sinuses. Superimposed air-fluid level noted within the left maxillary sinus. Trace bilateral mastoid effusions. Visualized nasopharynx within normal limits. Inner ear structures grossly normal. Other: None. MRA HEAD FINDINGS ANTERIOR CIRCULATION: Examination somewhat degraded by motion artifact. Visualized distal cervical segments of the internal carotid arteries are widely patent with antegrade flow. Petrous, cavernous, and  supraclinoid ICAs demonstrate probable atheromatous irregularity without appreciable hemodynamically significant stenosis. A1 segments patent bilaterally. Normal anterior communicating artery complex. Anterior cerebral arteries patent to their distal aspects without stenosis. No M1 stenosis or occlusion. Normal MCA bifurcations. Distal MCA branches well perfused and fairly symmetric. POSTERIOR CIRCULATION: Vertebral arteries patent to the vertebrobasilar junction without stenosis. Both PICAs patent proximally. Basilar patent to its distal aspect without stenosis. Superior cerebral arteries patent bilaterally. Right PCA supplied via a hypoplastic right P1 segment and prominent right posterior communicating artery. Fetal type origin left PCA. Both PCAs perfused to their distal aspects without proximal flow-limiting stenosis. No aneurysm. IMPRESSION: MRI HEAD IMPRESSION: 1. 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 An embolic etiology is suspected. No significant mass effect. 2. Underlying age-related cerebral atrophy with mild chronic small vessel ischemic disease. Small remote left cerebellar infarct. MRA HEAD IMPRESSION: 1. Technically limited exam due to motion artifact. 2. Negative intracranial MRA for large vessel occlusion. No hemodynamically significant or correctable stenosis. 3. Mild atheromatous irregularity about the carotid siphons without significant stenosis. Electronically Signed   By: Rise MuBenjamin  McClintock M.D.   On: 04/22/2020 01:29   MR BRAIN WO CONTRAST  Result Date: 04/22/2020 CLINICAL DATA:  Follow-up examination for acute stroke. EXAM: MRI HEAD WITHOUT CONTRAST MRA HEAD WITHOUT CONTRAST TECHNIQUE: Multiplanar, multiecho pulse sequences of the brain and surrounding structures were obtained without intravenous contrast. Angiographic images of the head were obtained using MRA  technique without contrast. COMPARISON:  Prior CT from 04/21/2020. FINDINGS: MRI HEAD FINDINGS Brain: Examination degraded by motion artifact. Generalized age-related cerebral atrophy. Mild chronic microvascular ischemic disease present within the periventricular and deep white matter both cerebral hemispheres. Small remote left cerebellar infarct noted. Patchy restricted diffusion seen involving the posterior right insula as well as the overlying right posterior frontal and parietal lobes, consistent with right MCA territory infarcts. No significant mass effect or associated hemorrhage. An embolic etiology is suspected given distribution. No other diffusion abnormality to suggest acute or subacute ischemia. Gray-white matter differentiation otherwise maintained. No other areas of encephalomalacia to suggest chronic cortical infarction on this motion degraded exam. No other definite foci of susceptibility artifact to suggest acute or chronic intracranial hemorrhage. No mass lesion, midline shift or mass effect. No hydrocephalus or extra-axial fluid collection. No made of a partially empty sella. Midline structures intact. Vascular: Major intracranial vascular  flow voids are maintained. Skull and upper cervical spine: Craniocervical junction within normal limits. Multilevel cervical spondylosis noted within the upper cervical spine without high-grade stenosis. Bone marrow signal intensity normal. No scalp soft tissue abnormality. Sinuses/Orbits: Globes and orbital soft tissues within normal limits. Scattered mucosal thickening noted throughout the ethmoidal air cells and maxillary sinuses. Superimposed air-fluid level noted within the left maxillary sinus. Trace bilateral mastoid effusions. Visualized nasopharynx within normal limits. Inner ear structures grossly normal. Other: None. MRA HEAD FINDINGS ANTERIOR CIRCULATION: Examination somewhat degraded by motion artifact. Visualized distal cervical segments of the  internal carotid arteries are widely patent with antegrade flow. Petrous, cavernous, and supraclinoid ICAs demonstrate probable atheromatous irregularity without appreciable hemodynamically significant stenosis. A1 segments patent bilaterally. Normal anterior communicating artery complex. Anterior cerebral arteries patent to their distal aspects without stenosis. No M1 stenosis or occlusion. Normal MCA bifurcations. Distal MCA branches well perfused and fairly symmetric. POSTERIOR CIRCULATION: Vertebral arteries patent to the vertebrobasilar junction without stenosis. Both PICAs patent proximally. Basilar patent to its distal aspect without stenosis. Superior cerebral arteries patent bilaterally. Right PCA supplied via a hypoplastic right P1 segment and prominent right posterior communicating artery. Fetal type origin left PCA. Both PCAs perfused to their distal aspects without proximal flow-limiting stenosis. No aneurysm. IMPRESSION: MRI HEAD IMPRESSION: 1. 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 An embolic etiology is suspected. No significant mass effect. 2. Underlying age-related cerebral atrophy with mild chronic small vessel ischemic disease. Small remote left cerebellar infarct. MRA HEAD IMPRESSION: 1. Technically limited exam due to motion artifact. 2. Negative intracranial MRA for large vessel occlusion. No hemodynamically significant or correctable stenosis. 3. Mild atheromatous irregularity about the carotid siphons without significant stenosis. Electronically Signed   By: Rise Mu M.D.   On: 04/22/2020 01:29   DG Chest Portable 1 View  Result Date: 04/21/2020 CLINICAL DATA:  Hypoxia EXAM: PORTABLE CHEST 1 VIEW COMPARISON:  None. FINDINGS: The heart size is enlarged. There are hazy bilateral airspace opacities. There is no pneumothorax. No large pleural effusion. There are  end-stage degenerative changes of both glenohumeral joints. IMPRESSION: Cardiomegaly with hazy bilateral airspace opacities concerning for pulmonary edema or an atypical infectious process. Electronically Signed   By: Katherine Mantle M.D.   On: 04/21/2020 20:21   ECHOCARDIOGRAM COMPLETE  Result Date: 04/23/2020    ECHOCARDIOGRAM REPORT   Patient Name:   Beverly Howell Date of Exam: 04/23/2020 Medical Rec #:  [id number]     Height:       65.0 in Accession #:    [id number]    Weight:       183.4 lb Date of Birth:  [birth year]      BSA:          1.907 m Patient Age:    79 years      BP:           151/94 mmHg Patient Gender: F             HR:           79 bpm. Exam Location:  Inpatient Procedure: 2D Echo, Cardiac Doppler and Color Doppler Indications:    Stroke 434.91 / I163.9  History:        Patient has no prior history of Echocardiogram examinations.                 Risk Factors:Hypertension and Dyslipidemia. COVID-19 Positive.  Sonographer:  Tiffany Dance Referring Phys: [id number] ARSHAD N KAKRAKANDY IMPRESSIONS  1. Left ventricular ejection fraction, by estimation, is 60 to 65%. The left ventricle has normal function. The left ventricle has no regional wall motion abnormalities. Left ventricular diastolic parameters are consistent with Grade I diastolic dysfunction (impaired relaxation).  2. Right ventricular systolic function is normal. The right ventricular size is normal. There is normal pulmonary artery systolic pressure. The estimated right ventricular systolic pressure is 27.4 mmHg.  3. The mitral valve is normal in structure. Trivial mitral valve regurgitation. No evidence of mitral stenosis.  4. The aortic valve is normal in structure. There is moderate calcification of the aortic valve. There is moderate thickening of the aortic valve. Aortic valve regurgitation is trivial. Mild to moderate aortic valve sclerosis/calcification is present, without any evidence of aortic stenosis. Aortic valve mean gradient  measures 8.5 mmHg. Aortic valve Vmax measures 2.13 m/s.  5. The inferior vena cava is normal in size with greater than 50% respiratory variability, suggesting right atrial pressure of 3 mmHg. Conclusion(s)/Recommendation(s): No intracardiac source of embolism detected on this transthoracic study. A transesophageal echocardiogram is recommended to exclude cardiac source of embolism if clinically indicated. FINDINGS  Left Ventricle: Left ventricular ejection fraction, by estimation, is 60 to 65%. The left ventricle has normal function. The left ventricle has no regional wall motion abnormalities. The left ventricular internal cavity size was normal in size. There is  no left ventricular hypertrophy. Left ventricular diastolic parameters are consistent with Grade I diastolic dysfunction (impaired relaxation). Right Ventricle: The right ventricular size is normal. No increase in right ventricular wall thickness. Right ventricular systolic function is normal. There is normal pulmonary artery systolic pressure. The tricuspid regurgitant velocity is 2.47 m/s, and  with an assumed right atrial pressure of 3 mmHg, the estimated right ventricular systolic pressure is 27.4 mmHg. Left Atrium: Left atrial size was normal in size. Right Atrium: Right atrial size was normal in size. Pericardium: There is no evidence of pericardial effusion. Mitral Valve: The mitral valve is normal in structure. Trivial mitral valve regurgitation. No evidence of mitral valve stenosis. Tricuspid Valve: The tricuspid valve is normal in structure. Tricuspid valve regurgitation is mild . No evidence of tricuspid stenosis. Aortic Valve: The aortic valve is normal in structure. There is moderate calcification of the aortic valve. There is moderate thickening of the aortic valve. Aortic valve regurgitation is trivial. Mild to moderate aortic valve sclerosis/calcification is present, without any evidence of aortic stenosis. Aortic valve mean gradient  measures 8.5 mmHg. Aortic valve peak gradient measures 18.2 mmHg. Aortic valve area, by VTI measures 1.58 cm. Pulmonic Valve: The pulmonic valve was normal in structure. Pulmonic valve regurgitation is not visualized. No evidence of pulmonic stenosis. Aorta: The aortic root is normal in size and structure. Venous: The inferior vena cava is normal in size with greater than 50% respiratory variability, suggesting right atrial pressure of 3 mmHg. IAS/Shunts: No atrial level shunt detected by color flow Doppler.  LEFT VENTRICLE PLAX 2D LVIDd:         3.90 cm  Diastology LVIDs:         2.72 cm  LV e' medial:    4.35 cm/s LV PW:         0.92 cm  LV E/e' medial:  14.5 LV IVS:        1.15 cm  LV e' lateral:   6.42 cm/s LVOT diam:     2.00 cm  LV E/e' lateral: 9.8 LV  SV:         63 LV SV Index:   33 LVOT Area:     3.14 cm  RIGHT VENTRICLE             IVC RV Basal diam:  2.65 cm     IVC diam: 1.81 cm RV S prime:     12.00 cm/s TAPSE (M-mode): 2.0 cm LEFT ATRIUM             Index       RIGHT ATRIUM          Index LA diam:        3.20 cm 1.68 cm/m  RA Area:     9.10 cm LA Vol (A2C):   56.4 ml 29.58 ml/m RA Volume:   14.70 ml 7.71 ml/m LA Vol (A4C):   59.4 ml 31.15 ml/m LA Biplane Vol: 60.1 ml 31.52 ml/m  AORTIC VALVE AV Area (Vmax):    1.49 cm AV Area (Vmean):   1.39 cm AV Area (VTI):     1.58 cm AV Vmax:           213.50 cm/s AV Vmean:          135.000 cm/s AV VTI:            0.395 m AV Peak Grad:      18.2 mmHg AV Mean Grad:      8.5 mmHg LVOT Vmax:         101.40 cm/s LVOT Vmean:        59.550 cm/s LVOT VTI:          0.199 m LVOT/AV VTI ratio: 0.50  AORTA Ao Root diam: 2.80 cm Ao Asc diam:  3.40 cm MITRAL VALVE                TRICUSPID VALVE MV Area (PHT): 2.45 cm     TR Peak grad:   24.4 mmHg MV Decel Time: 310 msec     TR Vmax:        247.00 cm/s MV E velocity: 62.90 cm/s MV A velocity: 103.00 cm/s  SHUNTS MV E/A ratio:  0.61         Systemic VTI:  0.20 m                             Systemic Diam: 2.00 cm Donato Schultz MD Electronically signed by Donato Schultz MD Signature Date/Time: 04/23/2020/2:48:24 PM    Final    VAS US CAROTID (at [facility] and [facility] only)  Result Date: 04/22/2020 Carotid Arterial Duplex Study Indications:       CVA and DVT positive. Risk Factors:      Hypertension. Other Factors:     COVID 19 positive. Comparison Study:  No prior studies. Performing Technologist: Chanda Busing RVT  Examination Guidelines: A complete evaluation includes B-mode imaging, spectral Doppler, color Doppler, and power Doppler as needed of all accessible portions of each vessel. Bilateral testing is considered an integral part of a complete examination. Limited examinations for reoccurring indications may be performed as noted.  Right Carotid Findings: +----------+--------+--------+--------+-----------------------+--------+           PSV cm/sEDV cm/sStenosisPlaque Description     Comments +----------+--------+--------+--------+-----------------------+--------+ CCA Prox  56      5               smooth and heterogenous         +----------+--------+--------+--------+-----------------------+--------+ CCA Distal49  11              smooth and heterogenous         +----------+--------+--------+--------+-----------------------+--------+ ICA Prox  48      14              smooth and heterogenous         +----------+--------+--------+--------+-----------------------+--------+ ICA Distal87      24                                     tortuous +----------+--------+--------+--------+-----------------------+--------+ ECA       59      6                                               +----------+--------+--------+--------+-----------------------+--------+ +----------+--------+-------+--------+-------------------+           PSV cm/sEDV cmsDescribeArm Pressure (mmHG) +----------+--------+-------+--------+-------------------+ [id number]                                          +----------+--------+-------+--------+-------------------+ +---------+--------+--+--------+--+---------+ VertebralPSV cm/s59EDV cm/s13Antegrade +---------+--------+--+--------+--+---------+  Left Carotid Findings: +----------+--------+--------+--------+-----------------------+--------+           PSV cm/sEDV cm/sStenosisPlaque Description     Comments +----------+--------+--------+--------+-----------------------+--------+ CCA Prox  97      19              smooth and heterogenous         +----------+--------+--------+--------+-----------------------+--------+ CCA Distal49      11              smooth and heterogenous         +----------+--------+--------+--------+-----------------------+--------+ ICA Prox  37      11              smooth and heterogenous         +----------+--------+--------+--------+-----------------------+--------+ ICA Distal59      22                                     tortuous +----------+--------+--------+--------+-----------------------+--------+ ECA       44      5                                               +----------+--------+--------+--------+-----------------------+--------+ +----------+--------+--------+--------+-------------------+           PSV cm/sEDV cm/sDescribeArm Pressure (mmHG) +----------+--------+--------+--------+-------------------+ [id number]                                          +----------+--------+--------+--------+-------------------+ +---------+--------+--+--------+--+---------+ VertebralPSV cm/s33EDV cm/s10Antegrade +---------+--------+--+--------+--+---------+   Summary: Right Carotid: Velocities in the right ICA are consistent with a 1-39% stenosis. Left Carotid: Velocities in the left ICA are consistent with a 1-39% stenosis. Vertebrals: Bilateral vertebral arteries demonstrate antegrade flow. *See table(s) above for measurements and observations.  Electronically signed by Delia Heady MD on  04/22/2020 at 1:17:39 PM.    Final    VAS [place] LOWER EXTREMITY  VENOUS (DVT)  Result Date: 04/22/2020  Lower Venous DVT Study Indications: Elevated Ddimer.  Risk Factors: COVID 19 positive. Comparison Study: No prior studies. Performing Technologist: Chanda Busing RVT  Examination Guidelines: A complete evaluation includes B-mode imaging, spectral Doppler, color Doppler, and power Doppler as needed of all accessible portions of each vessel. Bilateral testing is considered an integral part of a complete examination. Limited examinations for reoccurring indications may be performed as noted. The reflux portion of the exam is performed with the patient in reverse Trendelenburg.  +---------+---------------+---------+-----------+----------+-----------------+ RIGHT    CompressibilityPhasicitySpontaneityPropertiesThrombus Aging    +---------+---------------+---------+-----------+----------+-----------------+ CFV      Full           Yes      Yes                                    +---------+---------------+---------+-----------+----------+-----------------+ SFJ      Full                                                           +---------+---------------+---------+-----------+----------+-----------------+ FV Prox  Full                                                           +---------+---------------+---------+-----------+----------+-----------------+ FV Mid   Partial        No       No                   Age Indeterminate +---------+---------------+---------+-----------+----------+-----------------+ FV DistalNone           No       No                   Age Indeterminate +---------+---------------+---------+-----------+----------+-----------------+ PFV      Full                                                           +---------+---------------+---------+-----------+----------+-----------------+ POP      None           No       No                   Age  Indeterminate +---------+---------------+---------+-----------+----------+-----------------+ PTV      None                                         Age Indeterminate +---------+---------------+---------+-----------+----------+-----------------+ PERO     None                                         Age Indeterminate +---------+---------------+---------+-----------+----------+-----------------+ Gastroc  None  Age Indeterminate +---------+---------------+---------+-----------+----------+-----------------+   +---------+---------------+---------+-----------+----------+--------------+ LEFT     CompressibilityPhasicitySpontaneityPropertiesThrombus Aging +---------+---------------+---------+-----------+----------+--------------+ CFV      Full           Yes      Yes                                 +---------+---------------+---------+-----------+----------+--------------+ SFJ      Full                                                        +---------+---------------+---------+-----------+----------+--------------+ FV Prox  Full                                                        +---------+---------------+---------+-----------+----------+--------------+ FV Mid   Full                                                        +---------+---------------+---------+-----------+----------+--------------+ FV DistalFull                                                        +---------+---------------+---------+-----------+----------+--------------+ PFV      Full                                                        +---------+---------------+---------+-----------+----------+--------------+ POP      None           No       No                   Acute          +---------+---------------+---------+-----------+----------+--------------+ PTV      None                                         Acute           +---------+---------------+---------+-----------+----------+--------------+ PERO     None                                         Acute          +---------+---------------+---------+-----------+----------+--------------+    Summary: RIGHT: - Findings consistent with age indeterminate deep vein thrombosis involving the right femoral vein, right popliteal vein, right posterior tibial veins, right peroneal veins, and right gastrocnemius veins. - No cystic structure found in the popliteal fossa.  LEFT: - Findings consistent with acute deep vein thrombosis involving the  left popliteal vein, left posterior tibial veins, and left peroneal veins. - No cystic structure found in the popliteal fossa.  *See table(s) above for measurements and observations. Electronically signed by Gretta Began MD on 04/22/2020 at 2:50:20 PM.    Final

## 2020-04-23 NOTE — Progress Notes (Signed)
STROKE TEAM PROGRESS NOTE   INTERVAL HISTORY Patient is sitting up in bed and appears comfortable.  She states there is significant improvement in her dysarthria and mild left upper extremity weakness. Vital signs are stable.  She has no new complaints.  White count slightly elevated but she appears afebrile Vitals:   04/23/20 0521 04/23/20 0800 04/23/20 1000 04/23/20 1028  BP: (!) 177/86     Pulse: 92     Resp: (!) 23     Temp: 97.6 F (36.4 C)     TempSrc: Axillary     SpO2: 98% 100% 100% 97%  Weight:      Height:       CBC:  Recent Labs  Lab 04/21/20 1950 04/21/20 2117 04/22/20 0235 04/23/20 0136  WBC 12.8*   < > 12.4* 16.2*  NEUTROABS 9.8*  --   --  14.3*  HGB 12.5   < > 11.3* 11.8*  HCT 39.0   < > 35.4* 37.0  MCV 93.3   < > 94.4 93.9  PLT 213   < > 167 175   < > = values in this interval not displayed.   Basic Metabolic Panel:  Recent Labs  Lab 04/21/20 1950 04/21/20 1950 04/21/20 2117 04/21/20 2117 04/22/20 0235 04/23/20 0136  NA 135   < > 139  --   --  143  K 4.8   < > 5.6*  --   --  4.4  CL 100   < > 102  --   --  107  CO2 24  --   --   --   --  21*  GLUCOSE 124*   < > 110*  --   --  121*  BUN 28*   < > 38*  --   --  27*  CREATININE 1.84*   < > 1.70*   < > 1.78* 1.42*  CALCIUM 8.8*  --   --   --   --  8.2*   < > = values in this interval not displayed.   Lipid Panel:  Recent Labs  Lab 04/22/20 0235  CHOL 158  TRIG 135  HDL 38*  CHOLHDL 4.2  VLDL 27  LDLCALC 93   HgbA1c:  Recent Labs  Lab 04/22/20 0235  HGBA1C 5.5   Urine Drug Screen:  Recent Labs  Lab 04/22/20 1205  LABOPIA NONE DETECTED  COCAINSCRNUR NONE DETECTED  LABBENZ NONE DETECTED  AMPHETMU NONE DETECTED  THCU NONE DETECTED  LABBARB NONE DETECTED    Alcohol Level  Recent Labs  Lab 04/21/20 1950  ETH <10    IMAGING past 24 hours No results found.  PHYSICAL EXAM Pleasant elderly African American lady in mild respiratory distress on oxygen. . Afebrile. Head is  nontraumatic. Neck is supple without bruit.    Cardiac exam no murmur or gallop. Lungs are clear to auscultation. Distal pulses are well felt. Neurological Exam : She is drowsy but arouses easily. She has mild dysarthria but no aphasia follows commands well.Extraocular movements are full range without nystagmus. She blinks to threat bilaterally. Mild left lower facial weakness. Tongue midline. Motor system exam trace left upper extremity drift with weakness of mostly grip and intrinsic hand muscles and orbits right over left upper extremity. Symmetric lower extremity strength. Sensation appears intact bilaterally. Deep tendon reflexes symmetric. Plantars are downgoing. Gait not tested. ASSESSMENT/PLAN [name] is a 79 y.o. female with history of HTN presenting with recurrent L sided weakness and dysarthria for  1.5 weeks, requiring NRB mask on ED arrival. COVID +. Elevated troponins.   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 Small vessel disease. 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 Sinus dz.   MRI  Patchy R MCA territory infarcts. Small vessel disease. Atrophy. 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  MRA head no LVO. Mild atherosclerosis B ICAs.  Carotid Doppler  B ICA 1-39% stenosis, VAs antegrade   2D Echo pending   LE dopplers R age indeterminate DVT R femoral, R poplietal, R post tib, R peroneal, R gastrocnemius; L acute DVT popliteal, L post tib, L peroneal  LDL 93  HgbA1c 5.5  VTE prophylaxis - IV Heparin  No antithrombotic prior to admission, now on aspirin 300 mg suppository daily and heparin IV.   Therapy recommendations:  pending   Disposition:  pending   Acute Hypoxic Resp. Failure due to Acute Covid 19 Viral Pneumonitis during the ongoing 2020 Covid 19 Pandemic   On steroids and  remdisivir  Acterma on admission   Trending inflammatory markers  Hypertension  Stable . Permissive hypertension (OK if < 220/120) but gradually normalize in 5-7 days . Long-term BP goal normotensive  Hyperlipidemia  Home meds:  Omega 3  LDL 93, goal < 70  Add statin once po access  Continue statin at discharge  Dysphagia . Secondary to stroke . NPO . Speech on board   Other Stroke Risk Factors  Advanced Age >/= 38   Former smoker, quit 23 years ago  Obesity, Body mass index is 30.52 kg/m., BMI >/= 30 associated with increased stroke risk, recommend weight loss, diet and exercise as appropriate   COVID  Other Active Problems  ARF  Elevated troponins  Hospital day # 2 She has presented with what appears to be embolic right MCA infarct in the setting of acute DVT and Covid positive state. Recommend I V heparin for anticoagulation till patient is able to swallow and then switch to NOAC.  Check echocardiogram results.  Continue ongoing physical, occupational and speech therapies.  Aggressive risk factor modification. Continue treatment for Covid positive state as per primary team. Discussed with patient and Dr.Ghimire.Greater than 50% time during this 25-minute visit was spent on counseling and coordination of care about embolic stroke and Covid positive state and acute DVT treatment discussion and answering questions.  Stroke team will sign off.  Kindly call for questions. Delia Heady, MD To contact Stroke Continuity provider, please refer to [email or web address]. After hours, contact General Neurology

## 2020-04-23 NOTE — Evaluation (Signed)
Physical Therapy Evaluation [name] MRN: [id number] DOB: Apr 24, 1941 Today's Date: 04/23/2020   History of Present Illness  Pt is 79 yo female with PMH of HTN.  She presented with L sided weakness and SOB. She was found to have acute R MCA ischemic CVA , bil LE DVT, and severe hypoxic resp failure due to COVID 19 PNE.  Clinical Impression  Pt admitted with above diagnosis. Pt presents with Lsided weakness and inattention (upper extremity more effected than lower), decreased mobility, activity tolerance, safety, balance, and cardiorespiratory endurance.  Pt was on 4 LPM O2 at rest but required up to 10 LPM O2 with activity. She had significant DOE and increased RR with activity that limited her activity.  Pt normally independent and lives with daughter who works.  She has a flight of steps to enter her home.  Pt would benefit from further rehab prior to return home.  Pt currently with functional limitations due to the deficits listed below (see PT Problem List). Pt will benefit from skilled PT to increase their independence and safety with mobility to allow discharge to the venue listed below.       Follow Up Recommendations CIR    Equipment Recommendations  Rolling walker with 5" wheels;3in1 (PT) (further assessment next venue)    Recommendations for Other Services Rehab consult     Precautions / Restrictions Precautions Precautions: Fall Precaution Comments: watch O2      Mobility  Bed Mobility Overal bed mobility: Needs Assistance Bed Mobility: Supine to Sit     Supine to sit: Min assist     General bed mobility comments: increased time    Transfers Overall transfer level: Needs assistance Equipment used: 2 person hand held assist Transfers: Sit to/from [employer] Sit to Stand: Min assist;+2 physical assistance;+2 safety/equipment Stand pivot transfers: +2 physical assistance;+2 safety/equipment;Min assist       General  transfer comment: Sit to stand x 4 and stand pivot x 2 during session (chair to bsc and back to chair).  Required cues for safe hand placement, increased time, and min A x 2 to boost up  Ambulation/Gait Ambulation/Gait assistance: Min assist;+2 safety/equipment;+2 physical assistance Gait Distance (Feet): 4 Feet Assistive device: 2 person hand held assist Gait Pattern/deviations: Step-to pattern;Decreased stride length;Shuffle Gait velocity: decreased   General Gait Details: Pt took a few steps from the bed to chair with HHA of 2 and min A of 2 for steadying.  Cues for breathing technique and posture.  Limited due to DOE of 4/4 and RR up to 51.  Stairs            Wheelchair Mobility    Modified Rankin (Stroke Patients Only)       Balance Overall balance assessment: Needs assistance Sitting-balance support: No upper extremity supported Sitting balance-Leahy Scale: Good     Standing balance support: Bilateral upper extremity supported Standing balance-Leahy Scale: Poor Standing balance comment: requiring UE support                             Pertinent Vitals/Pain Pain Assessment: No/denies pain    Home Living Family/patient expects to be discharged to:: Private residence Living Arrangements: Children (daughter works) Available Help at Discharge: Family;Available PRN/intermittently Type of Home: Apartment (2nd floor) Home Access: Stairs to enter Entrance Stairs-Rails: Right;Left Entrance Stairs-Number of Steps: flight Home Layout: One level Home Equipment: Cane - single point Additional Comments: no shower seat  Prior Function     Gait / Transfers Assistance Needed: Uses a cane in her R hand to walk; daughter assisted with stairs but reports she rarely goes out  ADL's / Homemaking Assistance Needed: Reports could perform ADLs and IADLs but reports she takes her time        Hand Dominance   Dominant Hand: Right    Extremity/Trunk Assessment    Upper Extremity Assessment Upper Extremity Assessment: Defer to OT evaluation (Deficits in L UE - defer to OT for details)    Lower Extremity Assessment Lower Extremity Assessment: LLE deficits/detail;RLE deficits/detail RLE Deficits / Details: ROM WFL; MMT 5/5 RLE Sensation: WNL RLE Coordination: WNL LLE Deficits / Details: ROM WFL; MMT 4+/5 LLE Sensation: WNL LLE Coordination: WNL    Cervical / Trunk Assessment Cervical / Trunk Assessment: Normal  Communication   Communication: Expressive difficulties (required increased time to respond at times with mild slurred speech)  Cognition Arousal/Alertness: Awake/alert Behavior During Therapy: WFL for tasks assessed/performed Overall Cognitive Status: Impaired/Different from baseline                                 General Comments: Overall oriented and able to follow commands.  Did demonstrate mild L inattention.      General Comments General comments (skin integrity, edema, etc.): Pt was on 4 LPM O2 at rest with sats in the low 90's. She became short of breath with transfers so initially increased to 7 LPM O2 for activity and eventually up to 10 LPM O2 for activity. With activity pt's RR as high as 51 breaths per min, HR up to 144 bpm max (mostly in 120's with activity and 90's at rest), and O2 sats 79% at the lowest. After each transfer , pt was given at least 2 mins to rest and recover sats to 90% and RR to 20's. She was cued for pursed lip relaxed breathing. Overall pt took about 10 mins to recover and decrease O2 back to 4 LPm with sats 91%.  BP was 176/108    Exercises     Assessment/Plan    PT Assessment Patient needs continued PT services  PT Problem List Decreased strength;Decreased mobility;Decreased safety awareness;Decreased range of motion;Decreased coordination;Decreased knowledge of precautions;Decreased activity tolerance;Decreased cognition;Cardiopulmonary status limiting activity;Decreased  balance;Decreased knowledge of use of DME       PT Treatment Interventions DME instruction;Therapeutic activities;Gait training;Patient/family education;Therapeutic exercise;Stair training;Balance training;Functional mobility training;Neuromuscular re-education;Cognitive remediation    PT Goals (Current goals can be found in the Care Plan section)  Acute Rehab PT Goals Patient Stated Goal: get stronger PT Goal Formulation: With patient Time For Goal Achievement: 05/07/20 Potential to Achieve Goals: Good    Frequency Min 4X/week   Barriers to discharge        Co-evaluation PT/OT/SLP Co-Evaluation/Treatment: Yes Reason for Co-Treatment: Complexity of the patient's impairments (multi-system involvement) PT goals addressed during session: Mobility/safety with mobility;Balance OT goals addressed during session: ADL's and self-care;Strengthening/ROM       AM-PAC PT "6 Clicks" Mobility  Outcome Measure Help needed turning from your back to your side while in a flat bed without using bedrails?: A Lot Help needed moving from lying on your back to sitting on the side of a flat bed without using bedrails?: A Lot Help needed moving to and from a bed to a chair (including a wheelchair)?: A Lot Help needed standing up from a chair using your arms (e.g.,  wheelchair or bedside chair)?: A Lot Help needed to walk in hospital room?: A Lot Help needed climbing 3-5 steps with a railing? : A Lot 6 Click Score: 12    End of Session Equipment Utilized During Treatment: Gait belt;Oxygen Activity Tolerance: Patient limited by fatigue;Other (comment) (limited by DOE and vitals) Patient left: with chair alarm set;in chair;with call bell/phone within reach Nurse Communication: Mobility status PT Visit Diagnosis: Unsteadiness on feet (R26.81);Muscle weakness (generalized) (M62.81);Hemiplegia and hemiparesis;Other (comment) (limited cardiorespiratory endurance) Hemiplegia - Right/Left: Left Hemiplegia -  dominant/non-dominant: Non-dominant Hemiplegia - caused by: Cerebral infarction    Time: 2122-4825 PT Time Calculation (min) (ACUTE ONLY): 42 min   Charges:   PT Evaluation $PT Eval Moderate Complexity: 1 Mod PT Treatments $Therapeutic Activity: 8-22 mins        Anise Salvo, PT Acute Rehab Services Pager [phone] [facility] [phone]    Rayetta Humphrey 04/23/2020, 5:21 PM

## 2020-04-23 NOTE — TOC Benefit Eligibility Note (Signed)
Transition of Care [facility]) Benefit Eligibility Note    Patient Details  Name: Beverly Howell MRN: [id number] Date of Birth: 03-06-1941   Medication/Dose: Arne Cleveland  5 MG BID  Covered?: Yes  Tier:  (PREFERRED)  Prescription Coverage [facility]  Spoke with Person/Company/Phone Number:: KATE  @  [facility] RX # [phone] OPT- MEMBER  Co-Pay: $ 47.00  Prior Approval: No  Deductible: Met (OUT-OF-POCKET : [place] Ham Phone Number: 04/23/2020, 2:03 PM

## 2020-04-23 NOTE — Progress Notes (Signed)
ANTICOAGULATION CONSULT NOTE - Initial Consult  Pharmacy Consult for Heparin Indication: DVT  No Known Allergies  Patient Measurements: Height: 5\' 5"  (165.1 cm) Weight: 83.2 kg (183 lb 6.4 oz) IBW/kg (Calculated) : 57 Heparin Dosing Weight: 69.8 kg  Vital Signs: Temp: 97.6 F (36.4 C) (11/10 0521) Temp Source: Axillary (11/10 0521) BP: 177/86 (11/10 0521) Pulse Rate: 92 (11/10 0521)  Labs: Recent Labs    04/21/20 1950 04/21/20 1950 04/21/20 2117 04/21/20 2117 04/21/20 2319 04/22/20 0235 04/22/20 0500 04/22/20 2148 04/23/20 0136 04/23/20 1308  HGB 12.5   < > 14.6   < >  --  11.3*  --   --  11.8*  --   HCT 39.0   < > 43.0  --   --  35.4*  --   --  37.0  --   PLT 213  --   --   --   --  167  --   --  175  --   APTT 27  --   --   --   --   --   --   --   --   --   LABPROT 17.2*  --   --   --   --   --   --   --   --   --   INR 1.5*  --   --   --   --   --   --   --   --   --   HEPARINUNFRC  --   --   --   --   --   --   --  0.63 0.61 0.35  CREATININE 1.84*   < > 1.70*  --   --  1.78*  --   --  1.42*  --   TROPONINIHS 22*   < >  --   --  24* 21* 18*  --   --   --    < > = values in this interval not displayed.    Estimated Creatinine Clearance: 34.8 mL/min (A) (by C-G formula based on SCr of 1.42 mg/dL (H)).   Medical History: Past Medical History:  Diagnosis Date  . High cholesterol   . Hypertension     Assessment: 79 year old female presented with left sided weakness found to have acute CVA and with acute respiratory failure with COVID infection, found to have DVT. No heparin bolused due to acute CVA. Will continue heparin at conservative dose with lower goal for heparin level.  Heparin level 0.35 (in goal range)  Goal of Therapy:  Heparin level 0.3-0.5 units/ml Monitor platelets by anticoagulation protocol: Yes   Plan:  Continue heparin infusion at 900 units/hr 8 hour confirmatory heparin level Monitor daily CBC, s/sx bleeding  Vertie Dibbern A. 71,  PharmD, BCPS, FNKF Clinical Pharmacist [place] Please utilize Amion for appropriate phone number to reach the unit pharmacist [facility])

## 2020-04-23 NOTE — Evaluation (Signed)
Occupational Therapy Evaluation [name] MRN: [id number] DOB: [birth year] Today's Date: 04/23/2020    History of Present Illness Pt is 79 yo female with PMH of HTN.  She presented with L sided weakness and SOB. She was found to have acute R MCA ischemic CVA , bil LE DVT, and severe hypoxic resp failure due to COVID 19 PNA.   Clinical Impression   PTA pt living with daughter and able to complete ADL and light IADL with as needed assist. Pt uses cane for mobility at baseline, does not leave her apartment unless it is for necessary appointments, etc. At time of eval, pt presents with ability to complete bed mobility at min A and sit <> stands at mod A +2. Pt was able to complete stand pivot transfer from bed > chair, chair > BSC, BSC> chair with significant rest breaks. Pt O2 sats drop quickly with mobility. She started session on 4L [place], and ultimately required 10L [place] to recover from ADL mobility and to keep sats >79%. Noted cognitive deficits in safety, awareness, and problem solving throughout session. LUE appears to have sensory impairments in proprioception with mild inattention. Given current status, recommend CIR to support safety, BADL engagement, and independent PLOF. OT will continue to follow per POC listed below.  Vitals: Pt required 10L of O2 via [place] to maintain SpO2 >79% with ADL mobility. RR was mid- high 40s with activity. HR was 110s-120s. Pt required 10 mins to recover.     Follow Up Recommendations  CIR;Supervision/Assistance - 24 hour    Equipment Recommendations  3 in 1 bedside commode;Wheelchair (measurements OT);Wheelchair cushion (measurements OT)    Recommendations for Other Services Rehab consult     Precautions / Restrictions Precautions Precautions: Fall Precaution Comments: watch O2 Restrictions Weight Bearing Restrictions: No      Mobility Bed Mobility Overal bed mobility: Needs Assistance Bed Mobility: Supine to Sit     Supine  to sit: Min assist     General bed mobility comments: increased time    Transfers Overall transfer level: Needs assistance Equipment used: 2 person hand held assist Transfers: Sit to/from Stand;Stand Pivot Transfers Sit to Stand: +2 physical assistance;+2 safety/equipment;Mod assist Stand pivot transfers: Mod assist;+2 physical assistance;+2 safety/equipment       General transfer comment: Sit to stand x 4 and stand pivot x 2 during session (chair to bsc and back to chair).  Required cues for safe hand placement, increased time, and mod A x 2 to boost up    Balance Overall balance assessment: Needs assistance Sitting-balance support: No upper extremity supported Sitting balance-Leahy Scale: Good     Standing balance support: Bilateral upper extremity supported;During functional activity Standing balance-Leahy Scale: Poor Standing balance comment: unable to release UE support to engage in posterior peri care                           ADL either performed or assessed with clinical judgement   ADL Overall ADL's : Needs assistance/impaired Eating/Feeding: Set up;Sitting   Grooming: Minimal assistance;Sitting   Upper Body Bathing: Moderate assistance;Sitting   Lower Body Bathing: Maximal assistance;Sitting/lateral leans;Sit to/from stand   Upper Body Dressing : Minimal assistance;Sitting   Lower Body Dressing: Maximal assistance;Sitting/lateral leans;Sit to/from stand   Toilet Transfer: Moderate assistance;+2 for safety/equipment;Stand-pivot;Maximal assistance;Cueing for safety;Cueing for sequencing;BSC Toilet Transfer Details (indicate cue type and reason): pt tf to [facility], requiring cues for safe hand placement and to pace  breathing- very quickly SOB with transfer Toileting- Clothing Manipulation and Hygiene: Total assistance;Sit to/from stand Toileting - Clothing Manipulation Details (indicate cue type and reason): pt required UE support of PT in standing while OT  completed peri care after BM with total A     Functional mobility during ADLs: Moderate assistance;Maximal assistance;+2 for safety/equipment;Cueing for safety General ADL Comments: LUE sensory impairments from stroke limit full ADL independence at this time. Pt also easily SOB with exertion, requiring 4L>10L with movement     Vision Baseline Vision/History: No visual deficits Patient Visual Report: No change from baseline Vision Assessment?: Vision impaired- to be further tested in functional context Additional Comments: mild inattention noted with LUE. Pt is able to attend to OT for entire conversation in L environment, but when asked to isolate her L hand in ADL she requires increased time and cues. Will further assess     Perception     Praxis      Pertinent Vitals/Pain Pain Assessment: No/denies pain     Hand Dominance Right   Extremity/Trunk Assessment Upper Extremity Assessment Upper Extremity Assessment: LUE deficits/detail LUE Deficits / Details: noted generalized weakness and impaired proprioception with mild inattention extremity. LUE Sensation: decreased proprioception LUE Coordination: decreased fine motor;decreased gross motor   Lower Extremity Assessment Lower Extremity Assessment: Defer to PT evaluation RLE Deficits / Details: ROM WFL; MMT 5/5 RLE Sensation: WNL RLE Coordination: WNL LLE Deficits / Details: ROM WFL; MMT 4+/5 LLE Sensation: WNL LLE Coordination: WNL   Cervical / Trunk Assessment Cervical / Trunk Assessment: Normal   Communication Communication Communication: Expressive difficulties (mild dysarthria)   Cognition Arousal/Alertness: Awake/alert Behavior During Therapy: WFL for tasks assessed/performed Overall Cognitive Status: Impaired/Different from baseline Area of Impairment: Following commands;Safety/judgement;Problem solving;Awareness                       Following Commands: Follows one step commands consistently;Follows  multi-step commands with increased time Safety/Judgement: Decreased awareness of deficits Awareness: Emergent Problem Solving: Slow processing;Decreased initiation;Requires verbal cues;Requires tactile cues General Comments: requires increased time and cues to process basic tasks. Increased processing time needed to produce verbal responses.   General Comments  Pt was on 4 LPM O2 at rest with sats in the low 90's. She became short of breath with transfers so initially increased to 7 LPM O2 for activity and eventually up to 10 LPM O2 for activity. With activity pt's RR as high as 51 breaths per min, HR up to 144 bpm max (mostly in 120's with activity and 90's at rest), and O2 sats 79% at the lowest. After each transfer , pt was given at least 2 mins to rest and recover sats to 90% and RR to 20's. She was cued for pursed lip relaxed breathing. Overall pt took about 10 mins to recover and decrease O2 back to 4 LPm with sats 91%.  BP was 176/108    Exercises     Shoulder Instructions      Home Living Family/patient expects to be discharged to:: Private residence Living Arrangements: Children Available Help at Discharge: Family;Available PRN/intermittently (daughter works during day) Type of Home: Apartment Home Access: Stairs to enter Secretary/administrator of Steps: flight (on second floor) Entrance Stairs-Rails: Right;Left Home Layout: One level     Bathroom Shower/Tub: Chief Strategy Officer: Standard     Home Equipment: Cane - single point   Additional Comments: no shower seat      Prior Functioning/Environment Level of  Independence: Independent with assistive device(s)  Gait / Transfers Assistance Needed: Uses a cane in her R hand to walk; daughter assisted with stairs but reports she rarely goes out ADL's / Homemaking Assistance Needed: Reports could perform ADLs and light IADLs but reports she takes her time            OT Problem List: Decreased  strength;Impaired vision/perception;Decreased knowledge of use of DME or AE;Decreased range of motion;Decreased coordination;Decreased cognition;Decreased activity tolerance;Impaired UE functional use;Cardiopulmonary status limiting activity;Impaired balance (sitting and/or standing);Decreased safety awareness      OT Treatment/Interventions: Self-care/ADL training;Visual/perceptual remediation/compensation;Patient/family education;Therapeutic exercise;Neuromuscular education;Balance training;Therapeutic activities;Energy conservation;DME and/or AE instruction;Cognitive remediation/compensation    OT Goals(Current goals can be found in the care plan section) Acute Rehab OT Goals Patient Stated Goal: get stronger OT Goal Formulation: With patient Time For Goal Achievement: 05/07/20 Potential to Achieve Goals: Good  OT Frequency: Min 3X/week   Barriers to D/C:            Co-evaluation PT/OT/SLP Co-Evaluation/Treatment: Yes Reason for Co-Treatment: For patient/therapist safety;To address functional/ADL transfers PT goals addressed during session: Mobility/safety with mobility;Balance OT goals addressed during session: ADL's and self-care;Proper use of Adaptive equipment and DME;Strengthening/ROM      AM-PAC OT "6 Clicks" Daily Activity     Outcome Measure Help from another person eating meals?: A Little Help from another person taking care of personal grooming?: A Little Help from another person toileting, which includes using toliet, bedpan, or urinal?: A Lot Help from another person bathing (including washing, rinsing, drying)?: A Lot Help from another person to put on and taking off regular upper body clothing?: A Little Help from another person to put on and taking off regular lower body clothing?: A Lot 6 Click Score: 15   End of Session Equipment Utilized During Treatment: Gait belt;Oxygen Nurse Communication: Mobility status  Activity Tolerance: Patient tolerated treatment  well Patient left: in chair;with call bell/phone within reach;with chair alarm set  OT Visit Diagnosis: Unsteadiness on feet (R26.81);Other abnormalities of gait and mobility (R26.89);Hemiplegia and hemiparesis;Muscle weakness (generalized) (M62.81) Hemiplegia - Right/Left: Left Hemiplegia - dominant/non-dominant: Non-Dominant Hemiplegia - caused by: Cerebral infarction                Time: 0354-6568 OT Time Calculation (min): 42 min Charges:  OT General Charges $OT Visit: 1 Visit OT Evaluation $OT Eval Moderate Complexity: 1 Mod  Dalphine Handing, MSOT, OTR/L Acute Rehabilitation Services [facility] Office Number: [phone] Pager: [phone]  Dalphine Handing 04/23/2020, 6:03 PM

## 2020-04-24 LAB — CBC WITH DIFFERENTIAL/PLATELET
Abs Immature Granulocytes: 0.25 10*3/uL — ABNORMAL HIGH (ref 0.00–0.07)
Basophils Absolute: 0 10*3/uL (ref 0.0–0.1)
Basophils Relative: 0 %
Eosinophils Absolute: 0 10*3/uL (ref 0.0–0.5)
Eosinophils Relative: 0 %
HCT: 37 % (ref 36.0–46.0)
Hemoglobin: 12 g/dL (ref 12.0–15.0)
Immature Granulocytes: 1 %
Lymphocytes Relative: 7 %
Lymphs Abs: 1.3 10*3/uL (ref 0.7–4.0)
MCH: 30.5 pg (ref 26.0–34.0)
MCHC: 32.4 g/dL (ref 30.0–36.0)
MCV: 94.1 fL (ref 80.0–100.0)
Monocytes Absolute: 0.5 10*3/uL (ref 0.1–1.0)
Monocytes Relative: 3 %
Neutro Abs: 17.4 10*3/uL — ABNORMAL HIGH (ref 1.7–7.7)
Neutrophils Relative %: 89 %
Platelets: 256 10*3/uL (ref 150–400)
RBC: 3.93 MIL/uL (ref 3.87–5.11)
RDW: 13.8 % (ref 11.5–15.5)
WBC: 19.4 10*3/uL — ABNORMAL HIGH (ref 4.0–10.5)
nRBC: 0.1 % (ref 0.0–0.2)

## 2020-04-24 LAB — COMPREHENSIVE METABOLIC PANEL
ALT: 20 U/L (ref 0–44)
AST: 31 U/L (ref 15–41)
Albumin: 2.5 g/dL — ABNORMAL LOW (ref 3.5–5.0)
Alkaline Phosphatase: 73 U/L (ref 38–126)
Anion gap: 14 (ref 5–15)
BUN: 27 mg/dL — ABNORMAL HIGH (ref 8–23)
CO2: 21 mmol/L — ABNORMAL LOW (ref 22–32)
Calcium: 8.5 mg/dL — ABNORMAL LOW (ref 8.9–10.3)
Chloride: 108 mmol/L (ref 98–111)
Creatinine, Ser: 1.13 mg/dL — ABNORMAL HIGH (ref 0.44–1.00)
GFR, Estimated: 50 mL/min — ABNORMAL LOW (ref >60)
Glucose, Bld: 138 mg/dL — ABNORMAL HIGH (ref 70–99)
Potassium: 4 mmol/L (ref 3.5–5.1)
Sodium: 143 mmol/L (ref 135–145)
Total Bilirubin: 1.5 mg/dL — ABNORMAL HIGH (ref 0.3–1.2)
Total Protein: 6.5 g/dL (ref 6.5–8.1)

## 2020-04-24 LAB — GLUCOSE, CAPILLARY
Glucose-Capillary: 121 mg/dL — ABNORMAL HIGH (ref 70–99)
Glucose-Capillary: 138 mg/dL — ABNORMAL HIGH (ref 70–99)
Glucose-Capillary: 144 mg/dL — ABNORMAL HIGH (ref 70–99)
Glucose-Capillary: 225 mg/dL — ABNORMAL HIGH (ref 70–99)

## 2020-04-24 LAB — C-REACTIVE PROTEIN: CRP: 13.2 mg/dL — ABNORMAL HIGH (ref <1.0)

## 2020-04-24 LAB — D-DIMER, QUANTITATIVE: D-Dimer, Quant: >20 ug/mL-FEU — ABNORMAL HIGH (ref 0.00–0.50)

## 2020-04-24 LAB — PROCALCITONIN: Procalcitonin: 1.58 ng/mL

## 2020-04-24 MED ORDER — AMLODIPINE BESYLATE 5 MG PO TABS
5.0000 mg | ORAL_TABLET | Freq: Every day | ORAL | Status: DC
  Administered 2020-04-24 – 2020-04-28 (×5): 5 mg via ORAL
  Filled 2020-04-24 (×5): qty 1

## 2020-04-24 NOTE — Progress Notes (Signed)
Physical Therapy Treatment [name] MRN: [id number] DOB: March 05, 1941 Today's Date: 04/24/2020    History of Present Illness Pt is 79 yo female with PMH of HTN.  She presented with L sided weakness and SOB. She was found to have acute R MCA ischemic CVA , bil LE DVT, and severe hypoxic resp failure due to COVID 19 PNA.    PT Comments    Patient up in chair, smiling and very pleasant. Unfortunately pt limited by elevated HR (160s) and low sats (low 70s) with standing to change bed pad. Tolerated minimal LE exercises and did well with learning pursed lip breathing and lateral rib expansion breaths.    Follow Up Recommendations  CIR     Equipment Recommendations  Rolling walker with 5" wheels;3in1 (PT) (further assessment next venue)    Recommendations for Other Services Rehab consult     Precautions / Restrictions Precautions Precautions: Fall Precaution Comments: watch O2    Mobility  Bed Mobility               General bed mobility comments: In recliner upon arrival  Transfers Overall transfer level: Needs assistance Equipment used: Rolling walker (2 wheeled) Transfers: Sit to/from Stand Sit to Stand: Min assist;+2 safety/equipment         General transfer comment: Min A for light power up into standing. Pt fatigues quickly adn requiring to sit after ~30 sec. HR elevating to 140s-150s, RR 40s, and SpO2 70s on 3L.   Ambulation/Gait             General Gait Details: unable due to elevated HR and decr sats   Stairs             Wheelchair Mobility    Modified Rankin (Stroke Patients Only)       Balance Overall balance assessment: Needs assistance Sitting-balance support: No upper extremity supported Sitting balance-Leahy Scale: Good     Standing balance support: Bilateral upper extremity supported;During functional activity Standing balance-Leahy Scale: Poor Standing balance comment: unable to release UE support to  engage in posterior peri care                            Cognition Arousal/Alertness: Awake/alert Behavior During Therapy: WFL for tasks assessed/performed Overall Cognitive Status: Impaired/Different from baseline Area of Impairment: Following commands;Safety/judgement;Problem solving;Awareness;Orientation                 Orientation Level: Disoriented to;Time (reporting it is 2002)     Following Commands: Follows one step commands consistently;Follows multi-step commands with increased time Safety/Judgement: Decreased awareness of deficits Awareness: Emergent Problem Solving: Slow processing;Decreased initiation;Requires verbal cues;Requires tactile cues General Comments: Pt able to state she is at the hospitle because she had a stroke; reporting it is 2002. Very agreeable to therapy. Poor following commands and sequencing for pursel ip rbeathing to decreased RR. Pt requiring Max cues      Exercises General Exercises - Lower Extremity Ankle Circles/Pumps: AROM;Both;10 reps Long Arc Quad: AROM;Both;10 reps Other Exercises Other Exercises: IS x5 pulled ~1038ml Other Exercises: AAROM and functional reach to touch therapist's hand; alteranting. x5. HR elevating to 140 and stopping for rest    General Comments General comments (skin integrity, edema, etc.): HR 115-113, SpO2 94% on 3L, RR 20s at rest. Max HR 154. Lowest SpO2 69% with poor pleth. RR 41 max. With PLB, sats up to 85% and required incr to 4L with sats 85-93%  Pertinent Vitals/Pain Pain Assessment: No/denies pain    Home Living                      Prior Function            PT Goals (current goals can now be found in the care plan section) Acute Rehab PT Goals Patient Stated Goal: get stronger Time For Goal Achievement: 05/07/20 Potential to Achieve Goals: Good Progress towards PT goals: Progressing toward goals    Frequency    Min 4X/week      PT Plan Current plan remains  appropriate    Co-evaluation PT/OT/SLP Co-Evaluation/Treatment: Yes Reason for Co-Treatment: Complexity of the patient's impairments (multi-system involvement);For patient/therapist safety;To address functional/ADL transfers (pt easily fatigues with elevated HR and drops sats) PT goals addressed during session: Mobility/safety with mobility;Balance;Proper use of DME OT goals addressed during session: ADL's and self-care      AM-PAC PT "6 Clicks" Mobility   Outcome Measure  Help needed turning from your back to your side while in a flat bed without using bedrails?: A Lot Help needed moving from lying on your back to sitting on the side of a flat bed without using bedrails?: A Lot Help needed moving to and from a bed to a chair (including a wheelchair)?: A Lot Help needed standing up from a chair using your arms (e.g., wheelchair or bedside chair)?: A Little Help needed to walk in hospital room?: A Lot Help needed climbing 3-5 steps with a railing? : A Lot 6 Click Score: 13    End of Session Equipment Utilized During Treatment: Gait belt;Oxygen Activity Tolerance: Patient limited by fatigue;Other (comment) (limited by DOE and vitals) Patient left: with chair alarm set;in chair;with call bell/phone within reach Nurse Communication: Mobility status PT Visit Diagnosis: Unsteadiness on feet (R26.81);Muscle weakness (generalized) (M62.81);Hemiplegia and hemiparesis;Other (comment) (limited cardiorespiratory endurance) Hemiplegia - Right/Left: Left Hemiplegia - dominant/non-dominant: Non-dominant Hemiplegia - caused by: Cerebral infarction     Time: 4403-4742 PT Time Calculation (min) (ACUTE ONLY): 39 min  Charges:  $Therapeutic Activity: 8-22 mins                      Jerolyn Center, PT Pager [phone]    Zena Amos 04/24/2020, 5:01 PM

## 2020-04-24 NOTE — Progress Notes (Signed)
PROGRESS NOTE                                                                                                                                                                                                             Patient Demographics:    Beverly Howell, is a 79 y.o. female, DOB - June 28, 1940, ZOX:[id number]  Outpatient Primary MD for the patient is System, Provider Not In   Admit date - 04/21/2020   LOS - 3  Chief Complaint  Patient presents with  . Chest Pain       Brief Narrative: Patient is a 79 y.o. female with PMHx of HTN-who presented with left-sided weakness and shortness of breath-was found to have acute CVA along with severe hypoxic respiratory failure due to COVID-19 pneumonitis.  See below for further details.  COVID-19 vaccinated status: Unvaccinated  Significant Events: 11/8>> presented to [facility] with left-sided weakness due to CVA and severe hypoxia due to COVID-19 pneumonia  Significant studies: 11/8>> chest x-ray: Hazy bilateral airspace opacities 11/8>> CT head: Small infarct in the posterior right insula. 11/9>> MRI brain: Acute ischemia in the right MCA territory 11/9>> MRI brain: No large vessel occlusion 11/9>> carotid Doppler: No significant stenosis 11/9>> lower extremity Doppler: Bilateral lower extremity DVT and multiple veins. 11/9>> A1c: 5.5 11/9>> LDL: 93 11/10>> Echo: EF 60-65%, grade 1 diastolic dysfunction, RV systolic function is normal.  [id number] medications: Steroids: 11/8>> Remdesivir: 11/8>> Actemra: 11/8 x 1  Antibiotics: None  Microbiology data: 11/8 >>blood culture: No growth  Procedures: None  Consults: None  DVT prophylaxis: apixaban (ELIQUIS) tablet 10 mg  apixaban (ELIQUIS) tablet 5 mg   Subjective:   Awake/alert-some shortness of breath with ambulation and activity-but much better than the past few days.  Claims left-sided weakness has improved.    Assessment  & Plan :   Acute Hypoxic Resp Failure due to Covid 19 Viral pneumonia: Had severe hypoxemia on initial presentation-treated with Actemra/steroid/Remdesivir-remarkable improvement in the past few days-Down to 4 L of oxygen today.  Continue steroids and Remdesivir.  Volume status is stable-does not require diuretics.  Procalcitonin level is mildly elevated-he has no indications of a bacterial infection-hypoxia is getting better with steroids and Remdesivir-continue to monitor off antimicrobial therapy.  Continue to follow closely-continue attempts to slowly titrate down FiO2.  Fever: afebrile O2 requirements:  SpO2: 94 %  O2 Flow Rate (L/min): 4 L/min   COVID-19 Labs: Recent Labs    04/22/20 0235 04/23/20 0136 04/24/20 0154  DDIMER >20.00* >20.00* >20.00*  CRP 24.2* 25.4* 13.2*       Component Value Date/Time   BNP 123.6 (H) 04/21/2020 1950    Recent Labs  Lab 04/23/20 1308 04/24/20 0154  PROCALCITON 2.54 1.58    Lab Results  Component Value Date   SARSCOV2NAA POSITIVE (A) 04/21/2020    Prone/Incentive Spirometry: encouraged patient to lie prone for 3-4 hours at a time for a total of 16 hours a day, and to encourage incentive spirometry use 3-4/hour.  Bilateral lower extremity DVT: Likely provoked by COVID-19-initially on IV heparin-but has been switched to Eliquis.  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  Left-sided weakness has improved-SLP evaluation completed on 11/10-has been started on a diet.  Continue PT/OT-recommendations are for CIR-but may require SNF as well.  Appreciate stroke team MD evaluation-Per my conversation with Dr. Pearlean Brownie on 11/11-okay to remain on Eliquis-aspirin not needed.  Continue high intensity statin.  HTN: BP creeping up-we will gradually lower-start low-dose amlodipine.  AKI on CKD stage IIIa: AKI likely hemodynamically  mediated-improved with supportive care.  Avoid nephrotoxic agents.   Obesity: Estimated body mass index is 30.52 kg/m as calculated from the following:   Height as of this encounter: 5\' 5"  (1.651 m).   Weight as of this encounter: 83.2 kg.   GI prophylaxis: PPI  ABG:    Component Value Date/Time   TCO2 27 04/21/2020 2117    Vent Settings: N/A  Condition - Guarded  Family Communication  :  Daughter [phone]) updated over the phone 11/11  Code Status :  Full Code  Diet :  Diet Order            Diet regular Room service appropriate? Yes; Fluid consistency: Thin  Diet effective now                  Disposition Plan  :   Status is: Inpatient  Remains inpatient appropriate because:Inpatient level of care appropriate due to severity of illness   Dispo: The patient is from: Home              Anticipated d/c is to: TBD              Anticipated d/c date is: > 3 days              Patient currently is not medically stable to d/c.   Barriers to discharge: Hypoxia requiring O2 supplementation/complete 5 days of IV Remdesivir  Antimicorbials  :    Anti-infectives (From admission, onward)   Start     Dose/Rate Route Frequency Ordered Stop   04/23/20 1000  remdesivir 100 mg in sodium chloride 0.9 % 100 mL IVPB       "Followed by" Linked Group Details   100 mg 200 mL/hr over 30 Minutes Intravenous Daily 04/22/20 0233 04/27/20 0959   04/22/20 0400  remdesivir 200 mg in sodium chloride 0.9% 250 mL IVPB       "Followed by" Linked Group Details   200 mg 580 mL/hr over 30 Minutes Intravenous Once 04/22/20 0233 04/22/20 0537      Inpatient Medications  Scheduled Meds: . apixaban  10 mg Oral BID   Followed by  . [START ON 04/30/2020] apixaban  5 mg Oral BID  .  atorvastatin  40 mg Oral Daily  . methylPREDNISolone (SOLU-MEDROL) injection  80 mg Intravenous BID   Followed by  . [START ON 04/25/2020] predniSONE  50 mg Oral Daily  . pantoprazole  40 mg Oral Daily    Continuous Infusions: . sodium chloride 10 mL/hr at 04/23/20 1549  . remdesivir 100 mg in NS 100 mL Stopped (04/23/20 2112)   PRN Meds:.acetaminophen **OR** acetaminophen (TYLENOL) oral liquid 160 mg/5 mL **OR** acetaminophen, hydrALAZINE   Time Spent in minutes 35   See all Orders from today for further details   Jeoffrey Massed M.D on 04/24/2020 at 9:50 AM  To page go to [email or web address] - use universal password  [facility] -  Office  [phone]    Objective:   Vitals:   04/23/20 2002 04/23/20 2358 04/24/20 0413 04/24/20 0717  BP: (!) 120/100 (!) 172/89 (!) 168/93 (!) 166/99  Pulse: 96 95 84 95  Resp: 20 (!) 23 (!) 23 (!) 21  Temp: 97.7 F (36.5 C) (!) 97.5 F (36.4 C) 97.6 F (36.4 C) 98.7 F (37.1 C)  TempSrc: Axillary Axillary Axillary Axillary  SpO2: 95% 97% 94% 94%  Weight:      Height:        Wt Readings from Last 3 Encounters:  04/22/20 83.2 kg  03/26/17 86.2 kg  02/03/14 87.5 kg     Intake/Output Summary (Last 24 hours) at 04/24/2020 0950 Last data filed at 04/24/2020 0412 Gross per 24 hour  Intake 1634.15 ml  Output 700 ml  Net 934.15 ml     Physical Exam Gen Exam:Alert awake-not in any distress HEENT:atraumatic, normocephalic Chest: B/L clear to auscultation anteriorly CVS:S1S2 regular Abdomen:soft non tender, non distended Extremities:no edema Neurology: Mild left-sided weakness. Skin: no rash   Data Review:    CBC Recent Labs  Lab 04/21/20 1950 04/21/20 2117 04/22/20 0235 04/23/20 0136 04/24/20 0154  WBC 12.8*  --  12.4* 16.2* 19.4*  HGB 12.5 14.6 11.3* 11.8* 12.0  HCT 39.0 43.0 35.4* 37.0 37.0  PLT 213  --  167 175 256  MCV 93.3  --  94.4 93.9 94.1  MCH 29.9  --  30.1 29.9 30.5  MCHC 32.1  --  31.9 31.9 32.4  RDW 13.7  --  13.6 13.7 13.8  LYMPHSABS 1.7  --   --  1.3 1.3  MONOABS 1.1*  --   --  0.5 0.5  EOSABS 0.0  --   --  0.0 0.0  BASOSABS 0.0  --   --  0.0 0.0    Chemistries  Recent Labs  Lab  04/21/20 1950 04/21/20 2117 04/22/20 0235 04/23/20 0136 04/24/20 0154  NA 135 139  --  143 143  K 4.8 5.6*  --  4.4 4.0  CL 100 102  --  107 108  CO2 24  --   --  21* 21*  GLUCOSE 124* 110*  --  121* 138*  BUN 28* 38*  --  27* 27*  CREATININE 1.84* 1.70* 1.78* 1.42* 1.13*  CALCIUM 8.8*  --   --  8.2* 8.5*  AST 31  --   --  35 31  ALT 16  --   --  20 20  ALKPHOS 58  --   --  63 73  BILITOT 1.3*  --   --  1.0 1.5*   ------------------------------------------------------------------------------------------------------------------ Recent Labs    04/22/20 0235  CHOL 158  HDL 38*  LDLCALC 93  TRIG 098  CHOLHDL 4.2  Lab Results  Component Value Date   HGBA1C 5.5 04/22/2020   ------------------------------------------------------------------------------------------------------------------ No results for input(s): TSH, T4TOTAL, T3FREE, THYROIDAB in the last 72 hours.  Invalid input(s): FREET3 ------------------------------------------------------------------------------------------------------------------ No results for input(s): VITAMINB12, FOLATE, FERRITIN, TIBC, IRON, RETICCTPCT in the last 72 hours.  Coagulation profile Recent Labs  Lab 04/21/20 1950  INR 1.5*    Recent Labs    04/23/20 0136 04/24/20 0154  DDIMER >20.00* >20.00*    Cardiac Enzymes No results for input(s): CKMB, TROPONINI, MYOGLOBIN in the last 168 hours.  Invalid input(s): CK ------------------------------------------------------------------------------------------------------------------    Component Value Date/Time   BNP 123.6 (H) 04/21/2020 1950    Micro Results Recent Results (from the past 240 hour(s))  Respiratory Panel by RT PCR (Flu A&B, Covid) - Nasopharyngeal Swab     Status: Abnormal   Collection Time: 04/21/20  6:41 PM   Specimen: Nasopharyngeal Swab  Result Value Ref Range Status   SARS Coronavirus 2 by RT PCR POSITIVE (A) NEGATIVE Final    Comment: RESULT CALLED TO,  READ BACK BY AND VERIFIED WITH: J FERRAIOLO 04/22/20 AT 0016 SK (NOTE) SARS-CoV-2 target nucleic acids are DETECTED.  SARS-CoV-2 RNA is generally detectable in upper respiratory specimens  during the acute phase of infection. Positive results are indicative of the presence of the identified virus, but do not rule out bacterial infection or co-infection with other pathogens not detected by the test. Clinical correlation with patient history and other diagnostic information is necessary to determine patient infection status. The expected result is Negative.  Fact Sheet for Patients:  https://www.moore.com/  Fact Sheet for Healthcare Providers: https://www.young.biz/  This test is not yet approved or cleared by the [place] FDA and  has been authorized for detection and/or diagnosis of SARS-CoV-2 by FDA under an Emergency Use Authorization (EUA).  This EUA will remain in effect (meaning this test can be use d) for the duration of  the COVID-19 declaration under Section 564(b)(1) of the Act, 21 U.S.C. section 360bbb-3(b)(1), unless the authorization is terminated or revoked sooner.      Influenza A by PCR NEGATIVE NEGATIVE Final   Influenza B by PCR NEGATIVE NEGATIVE Final    Comment: (NOTE) The Xpert Xpress SARS-CoV-2/FLU/RSV assay is intended as an aid in  the diagnosis of influenza from Nasopharyngeal swab specimens and  should not be used as a sole basis for treatment. Nasal washings and  aspirates are unacceptable for Xpert Xpress SARS-CoV-2/FLU/RSV  testing.  Fact Sheet for Patients: https://www.moore.com/  Fact Sheet for Healthcare Providers: https://www.young.biz/  This test is not yet approved or cleared by the [place] FDA and  has been authorized for detection and/or diagnosis of SARS-CoV-2 by  FDA under an Emergency Use Authorization (EUA). This EUA will remain  in effect  (meaning this test can be used) for the duration of the  Covid-19 declaration under Section 564(b)(1) of the Act, 21  U.S.C. section 360bbb-3(b)(1), unless the authorization is  terminated or revoked. Performed at [facility], [address] [place], [place] [address]   Blood culture (routine x 2)     Status: None (Preliminary result)   Collection Time: 04/21/20  7:50 PM   Specimen: BLOOD  Result Value Ref Range Status   Specimen Description BLOOD SITE NOT SPECIFIED  Final   Special Requests   Final    BOTTLES DRAWN AEROBIC AND ANAEROBIC Blood Culture adequate volume   Culture   Final    NO GROWTH 3 DAYS Performed at  [facility], [address] [place], [place] [address]    Report Status PENDING  Incomplete  Blood culture (routine x 2)     Status: None (Preliminary result)   Collection Time: 04/21/20  9:10 PM   Specimen: BLOOD  Result Value Ref Range Status   Specimen Description BLOOD SITE NOT SPECIFIED  Final   Special Requests   Final    BOTTLES DRAWN AEROBIC AND ANAEROBIC Blood Culture results may not be optimal due to an inadequate volume of blood received in culture bottles   Culture   Final    NO GROWTH 3 DAYS Performed at [facility], [address] [place], [place] [address]    Report Status PENDING  Incomplete    Radiology Reports DG Abd 1 View  Result Date: 04/22/2020 CLINICAL DATA:  MRI clearance.  Altered mental status. EXAM: ABDOMEN - 1 VIEW COMPARISON:  None. FINDINGS: The bowel gas pattern is normal. No radio-opaque calculi or other significant radiographic abnormality are seen. There appear to be hazy airspace opacities involving the lungs bilaterally. No metallic foreign body identified. There are degenerative changes of both hips, right worse than left. There are degenerative changes of the lumbar spine. IMPRESSION: 1. No metallic foreign body identified. 2. Hazy airspace opacities involving the lungs bilaterally. 3. Nonobstructive bowel  gas pattern. Electronically Signed   By: Katherine Mantlehristopher  Green M.D.   On: 04/22/2020 00:23   CT Head Wo Contrast  Result Date: 04/21/2020 CLINICAL DATA:  79 year old female with intermittent dysarthria and left side weakness x1 week, progressive since 1400 hours on 04/20/2020. Hypoxia on initial presentation. EXAM: CT HEAD WITHOUT CONTRAST TECHNIQUE: Contiguous axial images were obtained from the base of the skull through the vertex without intravenous contrast. COMPARISON:  None. FINDINGS: Brain: Small chronic infarct in the left cerebellum (series 3, image 8). No acute intracranial hemorrhage identified. No midline shift, mass effect, or evidence of intracranial mass lesion. No ventriculomegaly. Mild for age scattered bilateral white matter hypodensity. Small age indeterminate hypodense area in the posterior right insula on series 3, image 15. No other no acute or subacute cortically based infarct identified. No supratentorial cortical encephalomalacia identified. Deep gray matter nuclei and brainstem appear within normal limits. Vascular: Calcified atherosclerosis at the skull base. No suspicious intracranial vascular hyperdensity. Skull: No acute osseous abnormality identified. Sinuses/Orbits: Small fluid level in the left maxillary sinus. Bubbly opacity there as well as in the right maxillary and sphenoid sinuses. Bubbly opacity in the posterior left ethmoid. Tympanic cavities and mastoids appear clear. Other: No acute orbit or scalp soft tissue finding identified. IMPRESSION: 1. Small infarct at the posterior right insula, age indeterminate but suspicious for subacute ischemia in this setting. No associated hemorrhage or mass effect. 2. Elsewhere mild for age cerebral white matter changes, and a small chronic left cerebellar infarct. 3. Acute paranasal sinus inflammation. Electronically Signed   By: Odessa FlemingH  Hall M.D.   On: 04/21/2020 22:56   MR ANGIO HEAD WO CONTRAST  Result Date: 04/22/2020 CLINICAL DATA:   Follow-up examination for acute stroke. EXAM: MRI HEAD WITHOUT CONTRAST MRA HEAD WITHOUT CONTRAST TECHNIQUE: Multiplanar, multiecho pulse sequences of the brain and surrounding structures were obtained without intravenous contrast. Angiographic images of the head were obtained using MRA technique without contrast. COMPARISON:  Prior CT from 04/21/2020. FINDINGS: MRI HEAD FINDINGS Brain: Examination degraded by motion artifact. Generalized age-related cerebral atrophy. Mild chronic microvascular ischemic disease present within the periventricular and deep white matter both cerebral hemispheres.  Small remote left cerebellar infarct noted. Patchy restricted diffusion seen involving the posterior right insula as well as the overlying right posterior frontal and parietal lobes, consistent with right MCA territory infarcts. No significant mass effect or associated hemorrhage. An embolic etiology is suspected given distribution. No other diffusion abnormality to suggest acute or subacute ischemia. Gray-white matter differentiation otherwise maintained. No other areas of encephalomalacia to suggest chronic cortical infarction on this motion degraded exam. No other definite foci of susceptibility artifact to suggest acute or chronic intracranial hemorrhage. No mass lesion, midline shift or mass effect. No hydrocephalus or extra-axial fluid collection. No made of a partially empty sella. Midline structures intact. Vascular: Major intracranial vascular flow voids are maintained. Skull and upper cervical spine: Craniocervical junction within normal limits. Multilevel cervical spondylosis noted within the upper cervical spine without high-grade stenosis. Bone marrow signal intensity normal. No scalp soft tissue abnormality. Sinuses/Orbits: Globes and orbital soft tissues within normal limits. Scattered mucosal thickening noted throughout the ethmoidal air cells and maxillary sinuses. Superimposed air-fluid level noted within the  left maxillary sinus. Trace bilateral mastoid effusions. Visualized nasopharynx within normal limits. Inner ear structures grossly normal. Other: None. MRA HEAD FINDINGS ANTERIOR CIRCULATION: Examination somewhat degraded by motion artifact. Visualized distal cervical segments of the internal carotid arteries are widely patent with antegrade flow. Petrous, cavernous, and supraclinoid ICAs demonstrate probable atheromatous irregularity without appreciable hemodynamically significant stenosis. A1 segments patent bilaterally. Normal anterior communicating artery complex. Anterior cerebral arteries patent to their distal aspects without stenosis. No M1 stenosis or occlusion. Normal MCA bifurcations. Distal MCA branches well perfused and fairly symmetric. POSTERIOR CIRCULATION: Vertebral arteries patent to the vertebrobasilar junction without stenosis. Both PICAs patent proximally. Basilar patent to its distal aspect without stenosis. Superior cerebral arteries patent bilaterally. Right PCA supplied via a hypoplastic right P1 segment and prominent right posterior communicating artery. Fetal type origin left PCA. Both PCAs perfused to their distal aspects without proximal flow-limiting stenosis. No aneurysm. IMPRESSION: MRI HEAD IMPRESSION: 1. 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 An embolic etiology is suspected. No significant mass effect. 2. Underlying age-related cerebral atrophy with mild chronic small vessel ischemic disease. Small remote left cerebellar infarct. MRA HEAD IMPRESSION: 1. Technically limited exam due to motion artifact. 2. Negative intracranial MRA for large vessel occlusion. No hemodynamically significant or correctable stenosis. 3. Mild atheromatous irregularity about the carotid siphons without significant stenosis. Electronically Signed   By: Rise Mu M.D.   On:  04/22/2020 01:29   MR BRAIN WO CONTRAST  Result Date: 04/22/2020 CLINICAL DATA:  Follow-up examination for acute stroke. EXAM: MRI HEAD WITHOUT CONTRAST MRA HEAD WITHOUT CONTRAST TECHNIQUE: Multiplanar, multiecho pulse sequences of the brain and surrounding structures were obtained without intravenous contrast. Angiographic images of the head were obtained using MRA technique without contrast. COMPARISON:  Prior CT from 04/21/2020. FINDINGS: MRI HEAD FINDINGS Brain: Examination degraded by motion artifact. Generalized age-related cerebral atrophy. Mild chronic microvascular ischemic disease present within the periventricular and deep white matter both cerebral hemispheres. Small remote left cerebellar infarct noted. Patchy restricted diffusion seen involving the posterior right insula as well as the overlying right posterior frontal and parietal lobes, consistent with right MCA territory infarcts. No significant mass effect or associated hemorrhage. An embolic etiology is suspected given distribution. No other diffusion abnormality to suggest acute or subacute ischemia. Gray-white matter differentiation otherwise maintained. No other areas of encephalomalacia to suggest chronic cortical  infarction on this motion degraded exam. No other definite foci of susceptibility artifact to suggest acute or chronic intracranial hemorrhage. No mass lesion, midline shift or mass effect. No hydrocephalus or extra-axial fluid collection. No made of a partially empty sella. Midline structures intact. Vascular: Major intracranial vascular flow voids are maintained. Skull and upper cervical spine: Craniocervical junction within normal limits. Multilevel cervical spondylosis noted within the upper cervical spine without high-grade stenosis. Bone marrow signal intensity normal. No scalp soft tissue abnormality. Sinuses/Orbits: Globes and orbital soft tissues within normal limits. Scattered mucosal thickening noted throughout the  ethmoidal air cells and maxillary sinuses. Superimposed air-fluid level noted within the left maxillary sinus. Trace bilateral mastoid effusions. Visualized nasopharynx within normal limits. Inner ear structures grossly normal. Other: None. MRA HEAD FINDINGS ANTERIOR CIRCULATION: Examination somewhat degraded by motion artifact. Visualized distal cervical segments of the internal carotid arteries are widely patent with antegrade flow. Petrous, cavernous, and supraclinoid ICAs demonstrate probable atheromatous irregularity without appreciable hemodynamically significant stenosis. A1 segments patent bilaterally. Normal anterior communicating artery complex. Anterior cerebral arteries patent to their distal aspects without stenosis. No M1 stenosis or occlusion. Normal MCA bifurcations. Distal MCA branches well perfused and fairly symmetric. POSTERIOR CIRCULATION: Vertebral arteries patent to the vertebrobasilar junction without stenosis. Both PICAs patent proximally. Basilar patent to its distal aspect without stenosis. Superior cerebral arteries patent bilaterally. Right PCA supplied via a hypoplastic right P1 segment and prominent right posterior communicating artery. Fetal type origin left PCA. Both PCAs perfused to their distal aspects without proximal flow-limiting stenosis. No aneurysm. IMPRESSION: MRI HEAD IMPRESSION: 1. 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 An embolic etiology is suspected. No significant mass effect. 2. Underlying age-related cerebral atrophy with mild chronic small vessel ischemic disease. Small remote left cerebellar infarct. MRA HEAD IMPRESSION: 1. Technically limited exam due to motion artifact. 2. Negative intracranial MRA for large vessel occlusion. No hemodynamically significant or correctable stenosis. 3. Mild atheromatous irregularity about the carotid siphons  without significant stenosis. Electronically Signed   By: Rise Mu M.D.   On: 04/22/2020 01:29   DG Chest Portable 1 View  Result Date: 04/21/2020 CLINICAL DATA:  Hypoxia EXAM: PORTABLE CHEST 1 VIEW COMPARISON:  None. FINDINGS: The heart size is enlarged. There are hazy bilateral airspace opacities. There is no pneumothorax. No large pleural effusion. There are end-stage degenerative changes of both glenohumeral joints. IMPRESSION: Cardiomegaly with hazy bilateral airspace opacities concerning for pulmonary edema or an atypical infectious process. Electronically Signed   By: Katherine Mantle M.D.   On: 04/21/2020 20:21   ECHOCARDIOGRAM COMPLETE  Result Date: 04/23/2020    ECHOCARDIOGRAM REPORT   Patient Name:   RONNETTE RUMP Date of Exam: 04/23/2020 Medical Rec #:  [id number]     Height:       65.0 in Accession #:    [id number]    Weight:       183.4 lb Date of Birth:  02-05-1941      BSA:          1.907 m Patient Age:    79 years      BP:           151/94 mmHg Patient Gender: F             HR:           79 bpm. Exam Location:  Inpatient Procedure: 2D Echo, Cardiac Doppler and Color Doppler Indications:  Stroke 434.91 / I163.9  History:        Patient has no prior history of Echocardiogram examinations.                 Risk Factors:Hypertension and Dyslipidemia. COVID-19 Positive.  Sonographer:    Elmarie Shiley Dance Referring Phys: [id number] ARSHAD N KAKRAKANDY IMPRESSIONS  1. Left ventricular ejection fraction, by estimation, is 60 to 65%. The left ventricle has normal function. The left ventricle has no regional wall motion abnormalities. Left ventricular diastolic parameters are consistent with Grade I diastolic dysfunction (impaired relaxation).  2. Right ventricular systolic function is normal. The right ventricular size is normal. There is normal pulmonary artery systolic pressure. The estimated right ventricular systolic pressure is 27.4 mmHg.  3. The mitral valve is normal in structure. Trivial  mitral valve regurgitation. No evidence of mitral stenosis.  4. The aortic valve is normal in structure. There is moderate calcification of the aortic valve. There is moderate thickening of the aortic valve. Aortic valve regurgitation is trivial. Mild to moderate aortic valve sclerosis/calcification is present, without any evidence of aortic stenosis. Aortic valve mean gradient measures 8.5 mmHg. Aortic valve Vmax measures 2.13 m/s.  5. The inferior vena cava is normal in size with greater than 50% respiratory variability, suggesting right atrial pressure of 3 mmHg. Conclusion(s)/Recommendation(s): No intracardiac source of embolism detected on this transthoracic study. A transesophageal echocardiogram is recommended to exclude cardiac source of embolism if clinically indicated. FINDINGS  Left Ventricle: Left ventricular ejection fraction, by estimation, is 60 to 65%. The left ventricle has normal function. The left ventricle has no regional wall motion abnormalities. The left ventricular internal cavity size was normal in size. There is  no left ventricular hypertrophy. Left ventricular diastolic parameters are consistent with Grade I diastolic dysfunction (impaired relaxation). Right Ventricle: The right ventricular size is normal. No increase in right ventricular wall thickness. Right ventricular systolic function is normal. There is normal pulmonary artery systolic pressure. The tricuspid regurgitant velocity is 2.47 m/s, and  with an assumed right atrial pressure of 3 mmHg, the estimated right ventricular systolic pressure is 27.4 mmHg. Left Atrium: Left atrial size was normal in size. Right Atrium: Right atrial size was normal in size. Pericardium: There is no evidence of pericardial effusion. Mitral Valve: The mitral valve is normal in structure. Trivial mitral valve regurgitation. No evidence of mitral valve stenosis. Tricuspid Valve: The tricuspid valve is normal in structure. Tricuspid valve regurgitation  is mild . No evidence of tricuspid stenosis. Aortic Valve: The aortic valve is normal in structure. There is moderate calcification of the aortic valve. There is moderate thickening of the aortic valve. Aortic valve regurgitation is trivial. Mild to moderate aortic valve sclerosis/calcification is present, without any evidence of aortic stenosis. Aortic valve mean gradient measures 8.5 mmHg. Aortic valve peak gradient measures 18.2 mmHg. Aortic valve area, by VTI measures 1.58 cm. Pulmonic Valve: The pulmonic valve was normal in structure. Pulmonic valve regurgitation is not visualized. No evidence of pulmonic stenosis. Aorta: The aortic root is normal in size and structure. Venous: The inferior vena cava is normal in size with greater than 50% respiratory variability, suggesting right atrial pressure of 3 mmHg. IAS/Shunts: No atrial level shunt detected by color flow Doppler.  LEFT VENTRICLE PLAX 2D LVIDd:         3.90 cm  Diastology LVIDs:         2.72 cm  LV e' medial:    4.35 cm/s LV PW:  0.92 cm  LV E/e' medial:  14.5 LV IVS:        1.15 cm  LV e' lateral:   6.42 cm/s LVOT diam:     2.00 cm  LV E/e' lateral: 9.8 LV SV:         63 LV SV Index:   33 LVOT Area:     3.14 cm  RIGHT VENTRICLE             IVC RV Basal diam:  2.65 cm     IVC diam: 1.81 cm RV S prime:     12.00 cm/s TAPSE (M-mode): 2.0 cm LEFT ATRIUM             Index       RIGHT ATRIUM          Index LA diam:        3.20 cm 1.68 cm/m  RA Area:     9.10 cm LA Vol (A2C):   56.4 ml 29.58 ml/m RA Volume:   14.70 ml 7.71 ml/m LA Vol (A4C):   59.4 ml 31.15 ml/m LA Biplane Vol: 60.1 ml 31.52 ml/m  AORTIC VALVE AV Area (Vmax):    1.49 cm AV Area (Vmean):   1.39 cm AV Area (VTI):     1.58 cm AV Vmax:           213.50 cm/s AV Vmean:          135.000 cm/s AV VTI:            0.395 m AV Peak Grad:      18.2 mmHg AV Mean Grad:      8.5 mmHg LVOT Vmax:         101.40 cm/s LVOT Vmean:        59.550 cm/s LVOT VTI:          0.199 m LVOT/AV VTI ratio:  0.50  AORTA Ao Root diam: 2.80 cm Ao Asc diam:  3.40 cm MITRAL VALVE                TRICUSPID VALVE MV Area (PHT): 2.45 cm     TR Peak grad:   24.4 mmHg MV Decel Time: 310 msec     TR Vmax:        247.00 cm/s MV E velocity: 62.90 cm/s MV A velocity: 103.00 cm/s  SHUNTS MV E/A ratio:  0.61         Systemic VTI:  0.20 m                             Systemic Diam: 2.00 cm Donato Schultz MD Electronically signed by Donato Schultz MD Signature Date/Time: 04/23/2020/2:48:24 PM    Final    VAS US CAROTID (at [facility] and [facility] only)  Result Date: 04/22/2020 Carotid Arterial Duplex Study Indications:       CVA and DVT positive. Risk Factors:      Hypertension. Other Factors:     COVID 19 positive. Comparison Study:  No prior studies. Performing Technologist: Chanda Busing RVT  Examination Guidelines: A complete evaluation includes B-mode imaging, spectral Doppler, color Doppler, and power Doppler as needed of all accessible portions of each vessel. Bilateral testing is considered an integral part of a complete examination. Limited examinations for reoccurring indications may be performed as noted.  Right Carotid Findings: +----------+--------+--------+--------+-----------------------+--------+           PSV cm/sEDV cm/sStenosisPlaque Description  Comments +----------+--------+--------+--------+-----------------------+--------+ CCA Prox  56      5               smooth and heterogenous         +----------+--------+--------+--------+-----------------------+--------+ CCA Distal49      11              smooth and heterogenous         +----------+--------+--------+--------+-----------------------+--------+ ICA Prox  48      14              smooth and heterogenous         +----------+--------+--------+--------+-----------------------+--------+ ICA Distal87      24                                     tortuous +----------+--------+--------+--------+-----------------------+--------+ ECA       59      6                                                +----------+--------+--------+--------+-----------------------+--------+ +----------+--------+-------+--------+-------------------+           PSV cm/sEDV cmsDescribeArm Pressure (mmHG) +----------+--------+-------+--------+-------------------+ [id number]                                         +----------+--------+-------+--------+-------------------+ +---------+--------+--+--------+--+---------+ VertebralPSV cm/s59EDV cm/s13Antegrade +---------+--------+--+--------+--+---------+  Left Carotid Findings: +----------+--------+--------+--------+-----------------------+--------+           PSV cm/sEDV cm/sStenosisPlaque Description     Comments +----------+--------+--------+--------+-----------------------+--------+ CCA Prox  97      19              smooth and heterogenous         +----------+--------+--------+--------+-----------------------+--------+ CCA Distal49      11              smooth and heterogenous         +----------+--------+--------+--------+-----------------------+--------+ ICA Prox  37      11              smooth and heterogenous         +----------+--------+--------+--------+-----------------------+--------+ ICA Distal59      22                                     tortuous +----------+--------+--------+--------+-----------------------+--------+ ECA       44      5                                               +----------+--------+--------+--------+-----------------------+--------+ +----------+--------+--------+--------+-------------------+           PSV cm/sEDV cm/sDescribeArm Pressure (mmHG) +----------+--------+--------+--------+-------------------+ [id number]                                          +----------+--------+--------+--------+-------------------+ +---------+--------+--+--------+--+---------+ VertebralPSV cm/s33EDV cm/s10Antegrade  +---------+--------+--+--------+--+---------+   Summary: Right Carotid: Velocities in the right ICA are consistent with a 1-39% stenosis. Left Carotid: Velocities in the  left ICA are consistent with a 1-39% stenosis. Vertebrals: Bilateral vertebral arteries demonstrate antegrade flow. *See table(s) above for measurements and observations.  Electronically signed by Delia Heady MD on 04/22/2020 at 1:17:39 PM.    Final    VAS [place] LOWER EXTREMITY VENOUS (DVT)  Result Date: 04/22/2020  Lower Venous DVT Study Indications: Elevated Ddimer.  Risk Factors: COVID 19 positive. Comparison Study: No prior studies. Performing Technologist: Chanda Busing RVT  Examination Guidelines: A complete evaluation includes B-mode imaging, spectral Doppler, color Doppler, and power Doppler as needed of all accessible portions of each vessel. Bilateral testing is considered an integral part of a complete examination. Limited examinations for reoccurring indications may be performed as noted. The reflux portion of the exam is performed with the patient in reverse Trendelenburg.  +---------+---------------+---------+-----------+----------+-----------------+ RIGHT    CompressibilityPhasicitySpontaneityPropertiesThrombus Aging    +---------+---------------+---------+-----------+----------+-----------------+ CFV      Full           Yes      Yes                                    +---------+---------------+---------+-----------+----------+-----------------+ SFJ      Full                                                           +---------+---------------+---------+-----------+----------+-----------------+ FV Prox  Full                                                           +---------+---------------+---------+-----------+----------+-----------------+ FV Mid   Partial        No       No                   Age Indeterminate +---------+---------------+---------+-----------+----------+-----------------+ FV  DistalNone           No       No                   Age Indeterminate +---------+---------------+---------+-----------+----------+-----------------+ PFV      Full                                                           +---------+---------------+---------+-----------+----------+-----------------+ POP      None           No       No                   Age Indeterminate +---------+---------------+---------+-----------+----------+-----------------+ PTV      None                                         Age Indeterminate +---------+---------------+---------+-----------+----------+-----------------+ PERO     None  Age Indeterminate +---------+---------------+---------+-----------+----------+-----------------+ Gastroc  None                                         Age Indeterminate +---------+---------------+---------+-----------+----------+-----------------+   +---------+---------------+---------+-----------+----------+--------------+ LEFT     CompressibilityPhasicitySpontaneityPropertiesThrombus Aging +---------+---------------+---------+-----------+----------+--------------+ CFV      Full           Yes      Yes                                 +---------+---------------+---------+-----------+----------+--------------+ SFJ      Full                                                        +---------+---------------+---------+-----------+----------+--------------+ FV Prox  Full                                                        +---------+---------------+---------+-----------+----------+--------------+ FV Mid   Full                                                        +---------+---------------+---------+-----------+----------+--------------+ FV DistalFull                                                        +---------+---------------+---------+-----------+----------+--------------+ PFV      Full                                                         +---------+---------------+---------+-----------+----------+--------------+ POP      None           No       No                   Acute          +---------+---------------+---------+-----------+----------+--------------+ PTV      None                                         Acute          +---------+---------------+---------+-----------+----------+--------------+ PERO     None                                         Acute          +---------+---------------+---------+-----------+----------+--------------+    Summary: RIGHT: - Findings consistent  with age indeterminate deep vein thrombosis involving the right femoral vein, right popliteal vein, right posterior tibial veins, right peroneal veins, and right gastrocnemius veins. - No cystic structure found in the popliteal fossa.  LEFT: - Findings consistent with acute deep vein thrombosis involving the left popliteal vein, left posterior tibial veins, and left peroneal veins. - No cystic structure found in the popliteal fossa.  *See table(s) above for measurements and observations. Electronically signed by Gretta Began MD on 04/22/2020 at 2:50:20 PM.    Final

## 2020-04-24 NOTE — Progress Notes (Signed)
Rehab Admissions Coordinator Note:  Patient was screened by Raechel Ache for appropriateness for an Inpatient Acute Rehab Consult.  Note pt is COVID-19 +.   Patients are eligible to be considered for admit to the [facility] when cleared from airborne precautions by Acute MD, or Infectious Disease MD.  Otherwise, they will need to be >20 days from their positive test with recovery/improvement in symptoms or 2 negative tests.    Will screen for appropriateness once the above criteria have been met.  Raechel Ache, OTR/L  Rehab Admissions Coordinator  [phone] 04/24/2020 9:31 AM

## 2020-04-24 NOTE — TOC Initial Note (Signed)
Transition of Care [facility]) - Initial/Assessment Note    Patient Details  Name: Beverly Howell MRN: [id number] Date of Birth: [birth year]  [facility]) CM/SW Contact:    Lockie Pares, RN Phone Number: 04/24/2020, 9:13 AM  Clinical Narrative:                 Admitted for COVID, lives in apartment, does not go out often, per patient,  PT OT recommending CIR and wheelchair, patient is deconditioned.  Patient has daughter and granddaughter as supports. If CIR not approved will need SNF bridge to home and [facility] health post DC. CM and CSW to follow for needs.  Expected Discharge Plan: IP Rehab Facility Barriers to Discharge: Continued Medical Work up   Patient Goals and CMS Choice        Expected Discharge Plan and Services Expected Discharge Plan: IP Rehab Facility   Discharge Planning Services: CM Consult   Living arrangements for the past 2 months: Apartment                                      Prior Living Arrangements/Services Living arrangements for the past 2 months: Apartment Lives with:: Self Patient language and need for interpreter reviewed:: Yes        Need for Family Participation in Patient Care: Yes (Comment) Care giver support system in place?: Yes (comment)   Criminal Activity/Legal Involvement Pertinent to Current Situation/Hospitalization: No - Comment as needed  Activities of Daily Living Home Assistive Devices/Equipment: Cane (specify quad or straight) ADL Screening (condition at time of admission) Patient's cognitive ability adequate to safely complete daily activities?: No Is the patient deaf or have difficulty hearing?: No Does the patient have difficulty seeing, even when wearing glasses/contacts?: No Does the patient have difficulty concentrating, remembering, or making decisions?: Yes Patient able to express need for assistance with ADLs?: Yes Does the patient have difficulty dressing or bathing?: Yes Independently performs  ADLs?: Yes (appropriate for developmental age) Does the patient have difficulty walking or climbing stairs?: Yes Weakness of Legs: Right Weakness of Arms/Hands: Right  Permission Sought/Granted                  Emotional Assessment       Orientation: : Oriented to Self, Oriented to Place, Oriented to  Time, Oriented to Situation Alcohol / Substance Use: Not Applicable Psych Involvement: No (comment)  Admission diagnosis:  Acute respiratory failure (HCC) [J96.00] Anosmia [R43.0] Acute respiratory failure with hypoxia (HCC) [J96.01] Foreign body (FB) in soft tissue [M79.5] Acute CVA (cerebrovascular accident) (HCC) [I63.9] Cerebrovascular accident (CVA), unspecified mechanism (HCC) [I63.9] Suspected COVID-19 virus infection [Z20.822] Patient Active Problem List   Diagnosis Date Noted  . Acute CVA (cerebrovascular accident) (HCC) 04/22/2020  . Acute respiratory failure due to COVID-19 (HCC) 04/22/2020  . ARF (acute renal failure) (HCC) 04/22/2020  . Essential hypertension 04/22/2020  . Acute respiratory failure (HCC) 04/21/2020   PCP:  System, Provider Not In Pharmacy:   [facility] [address] [place], [place] - [address] [place] [address] Phone: [phone] Fax: [phone]     Social Determinants of Health (SDOH) Interventions    Readmission Risk Interventions No flowsheet data found.

## 2020-04-24 NOTE — TOC Initial Note (Signed)
Transition of Care [facility]) - Initial/Assessment Note    Patient Details  Name: Beverly Howell MRN: [id number] Date of Birth: 12-Sep-1940  Transition of Care [facility]) CM/SW Contact:    Mearl Latin, LCSW Phone Number: 04/24/2020, 10:09 AM  Clinical Narrative:                 CSW received consult for possible SNF placement at time of discharge. CSW spoke with patient's daughter regarding PT recommendation of SNF placement at time of discharge. CSW discussed patient's home support. She reported that she would like to consult with her family and will let CSW know their decision by the end of today. CSW discussed insurance authorization process (can take up to 72 hours) and info about the COVID facility, [facility]. CSW to continue to follow and assist with discharge planning needs.   Expected Discharge Plan: Skilled Nursing Facility Barriers to Discharge: Continued Medical Work up   Patient Goals and CMS Choice Patient states their goals for this hospitalization and ongoing recovery are:: Rehab CMS Medicare.gov Compare Post Acute Care list provided to:: Patient Represenative (must comment) (Daughter) Choice offered to / list presented to : Adult Children, Patient  Expected Discharge Plan and Services Expected Discharge Plan: Skilled Nursing Facility In-house Referral: Clinical Social Work Discharge Planning Services: CM Consult   Living arrangements for the past 2 months: Apartment                                      Prior Living Arrangements/Services Living arrangements for the past 2 months: Apartment Lives with:: Self Patient language and need for interpreter reviewed:: Yes        Need for Family Participation in Patient Care: Yes (Comment) Care giver support system in place?: Yes (comment)   Criminal Activity/Legal Involvement Pertinent to Current Situation/Hospitalization: No - Comment as needed  Activities of Daily Living Home Assistive Devices/Equipment: Cane (specify  quad or straight) ADL Screening (condition at time of admission) Patient's cognitive ability adequate to safely complete daily activities?: No Is the patient deaf or have difficulty hearing?: No Does the patient have difficulty seeing, even when wearing glasses/contacts?: No Does the patient have difficulty concentrating, remembering, or making decisions?: Yes Patient able to express need for assistance with ADLs?: Yes Does the patient have difficulty dressing or bathing?: Yes Independently performs ADLs?: Yes (appropriate for developmental age) Does the patient have difficulty walking or climbing stairs?: Yes Weakness of Legs: Right Weakness of Arms/Hands: Right  Permission Sought/Granted      Share Information with NAME: [name]  Permission granted to share info w AGENCY: SNFs  Permission granted to share info w Relationship: Daughter  Permission granted to share info w Contact Information: [phone]  Emotional Assessment   Attitude/Demeanor/Rapport: Unable to Assess Affect (typically observed): Unable to Assess Orientation: : Oriented to Self, Oriented to Place, Oriented to  Time, Oriented to Situation Alcohol / Substance Use: Not Applicable Psych Involvement: No (comment)  Admission diagnosis:  Acute respiratory failure (HCC) [J96.00] Anosmia [R43.0] Acute respiratory failure with hypoxia (HCC) [J96.01] Foreign body (FB) in soft tissue [M79.5] Acute CVA (cerebrovascular accident) (HCC) [I63.9] Cerebrovascular accident (CVA), unspecified mechanism (HCC) [I63.9] Suspected COVID-19 virus infection [Z20.822] Patient Active Problem List   Diagnosis Date Noted  . Acute CVA (cerebrovascular accident) (HCC) 04/22/2020  . Acute respiratory failure due to COVID-19 (HCC) 04/22/2020  . ARF (acute renal failure) (HCC) 04/22/2020  . Essential  hypertension 04/22/2020  . Acute respiratory failure (HCC) 04/21/2020   PCP:  System, Provider Not In Pharmacy:   [facility] [address] [place], [place] - [address] [place] [address] Phone: [phone] Fax: [phone]     Social Determinants of Health (SDOH) Interventions    Readmission Risk Interventions No flowsheet data found.

## 2020-04-24 NOTE — Progress Notes (Signed)
Occupational Therapy Treatment [name] MRN: [id number] DOB: May 20, 1941 Today's Date: 04/24/2020    History of present illness Pt is 79 yo female with PMH of HTN.  She presented with L sided weakness and SOB. She was found to have acute R MCA ischemic CVA , bil LE DVT, and severe hypoxic resp failure due to COVID 19 PNA.   OT comments  Upon arrival, pt sitting in recliner and happily watching TV; SpO2 94% on 3L, RR 20s, and HR ~115. Pt very agreeable to therapy. Continues to present with decreased cognition, functional use of LUE, and activity tolerance. With sitting upright, pt's HR elevating to 130s and SpO2 dropping to 86% on 3L. Having pt take rest breaks and limiting activity to single sit<>stand to change soiled pad at chair. Pt requiring Min A for sit<>stand and cues for hand placement and weight shift. HR elevating to 154 and SpO2 dropping to 69% (poor pleth) on 3L. Elevated to 4L for recovery back to >88%. Despite fatigue, pt wanting to participate. Facilitating IS and UE AROM. Continue to recommend dc to CIR and will continue to follow acutely as admitted.    Follow Up Recommendations  CIR;Supervision/Assistance - 24 hour    Equipment Recommendations  3 in 1 bedside commode;Wheelchair (measurements OT);Wheelchair cushion (measurements OT)    Recommendations for Other Services Rehab consult    Precautions / Restrictions Precautions Precautions: Fall Precaution Comments: watch O2       Mobility Bed Mobility               General bed mobility comments: In recliner upon arrival  Transfers Overall transfer level: Needs assistance Equipment used: Rolling walker (2 wheeled) Transfers: Sit to/from [employer] Sit to Stand: Min assist;+2 safety/equipment         General transfer comment: Min A for light power up into standing. Pt fatigues quickly adn requiring to sit after ~30 sec. HR elevating to 140s-150s, RR 40s, and SpO2  70s on 3L.     Balance Overall balance assessment: Needs assistance Sitting-balance support: No upper extremity supported Sitting balance-Leahy Scale: Good     Standing balance support: Bilateral upper extremity supported;During functional activity Standing balance-Leahy Scale: Poor                             ADL either performed or assessed with clinical judgement   ADL Overall ADL's : Needs assistance/impaired                                     Functional mobility during ADLs: Minimal assistance;Rolling walker General ADL Comments: Upon arrival, pt with elevated HR while resting in recliner. Limited activity tolerance. Focused on sit<>stand to change soiled pad and coloring task (focusing on cognition and vision).      Vision   Vision Assessment?: Vision impaired- to be further tested in functional context   Perception     Praxis      Cognition Arousal/Alertness: Awake/alert Behavior During Therapy: WFL for tasks assessed/performed Overall Cognitive Status: Impaired/Different from baseline Area of Impairment: Following commands;Safety/judgement;Problem solving;Awareness;Orientation                 Orientation Level: Disoriented to;Time (reporting it is 2002)     Following Commands: Follows one step commands consistently;Follows multi-step commands with increased time Safety/Judgement: Decreased awareness of deficits Awareness: Emergent  Problem Solving: Slow processing;Decreased initiation;Requires verbal cues;Requires tactile cues General Comments: Pt able to state she is at the hospitle because she had a stroke; reporting it is 2002. Very agreeable to therapy. Poor following commands and sequencing for pursel ip rbeathing to decreased RR. Pt requiring Max cues        Exercises Exercises: Other exercises Other Exercises Other Exercises: IS x5 pulled ~104ml Other Exercises: AAROM and functional reach to touch therapist's hand;  alteranting. x5. HR elevating to 140 and stopping for rest   Shoulder Instructions       General Comments HR 115-113, SpO2 94% on 3L, RR 20s at rest. Max HR 154. Lowest SpO2 69% with poor pleth. RR 41 max. With PLB, sats up to 85% and required incr to 4L with sats 85-93%    Pertinent Vitals/ Pain       Pain Assessment: No/denies pain  Home Living                                          Prior Functioning/Environment              Frequency  Min 3X/week        Progress Toward Goals  OT Goals(current goals can now be found in the care plan section)  Progress towards OT goals: Progressing toward goals  Acute Rehab OT Goals Patient Stated Goal: get stronger OT Goal Formulation: With patient Time For Goal Achievement: 05/07/20 Potential to Achieve Goals: Good ADL Goals Pt Will Perform Upper Body Dressing: with set-up;sitting Pt Will Perform Lower Body Dressing: with min assist;sitting/lateral leans;sit to/from stand Pt Will Transfer to Toilet: with min assist;stand pivot transfer;bedside commode Pt Will Perform Toileting - Clothing Manipulation and hygiene: with mod assist;sit to/from stand Pt/caregiver will Perform Home Exercise Program: Increased strength;Left upper extremity;With minimal assist;With written HEP provided (and proprioception) Additional ADL Goal #1: Pt will identify and grasp 8/10 ADL items in L sided environment with LUE Additional ADL Goal #2: Pt will recall and apply at least 3 energy conservation strategies during ADL routine  Plan Discharge plan remains appropriate    Co-evaluation    PT/OT/SLP Co-Evaluation/Treatment: Yes Reason for Co-Treatment: For patient/therapist safety;To address functional/ADL transfers   OT goals addressed during session: ADL's and self-care      AM-PAC OT "6 Clicks" Daily Activity     Outcome Measure   Help from another person eating meals?: A Little Help from another person taking care of  personal grooming?: A Little Help from another person toileting, which includes using toliet, bedpan, or urinal?: A Lot Help from another person bathing (including washing, rinsing, drying)?: A Lot Help from another person to put on and taking off regular upper body clothing?: A Little Help from another person to put on and taking off regular lower body clothing?: A Lot 6 Click Score: 15    End of Session Equipment Utilized During Treatment: Oxygen;Rolling walker  OT Visit Diagnosis: Unsteadiness on feet (R26.81);Other abnormalities of gait and mobility (R26.89);Hemiplegia and hemiparesis;Muscle weakness (generalized) (M62.81) Hemiplegia - Right/Left: Left Hemiplegia - dominant/non-dominant: Non-Dominant Hemiplegia - caused by: Cerebral infarction   Activity Tolerance Patient tolerated treatment well   Patient Left in chair;with call bell/phone within reach;with chair alarm set   Nurse Communication Mobility status        Time: 3716-9678 OT Time Calculation (min): 39 min  Charges: OT General Charges $OT  Visit: 1 Visit OT Treatments $Therapeutic Activity: 23-37 mins  Emillia Weatherly MSOT, OTR/L Acute Rehab Pager: [phone] Office: [phone]   Theodoro Grist Zygmunt Mcglinn 04/24/2020, 4:28 PM

## 2020-04-24 NOTE — TOC Progression Note (Signed)
Transition of Care [facility]) - Progression Note    Patient Details  Name: Beverly Howell MRN: [id number] Date of Birth: 07-Oct-1940  Transition of Care [facility]) CM/SW Contact  Mearl Latin, LCSW Phone Number: 04/24/2020, 2:38 PM  Clinical Narrative:    CSW received call from patient's daughter. She has decided that SNF will be best for patient at discharge. [facility] able to accept patient and will begin insurance authorization.    Expected Discharge Plan: Skilled Nursing Facility Barriers to Discharge: Continued Medical Work up  Expected Discharge Plan and Services Expected Discharge Plan: Skilled Nursing Facility In-house Referral: Clinical Social Work Discharge Planning Services: CM Consult   Living arrangements for the past 2 months: Apartment                                       Social Determinants of Health (SDOH) Interventions    Readmission Risk Interventions No flowsheet data found.

## 2020-04-24 NOTE — NC FL2 (Signed)
[place] MEDICAID FL2 LEVEL OF CARE SCREENING TOOL     IDENTIFICATION  Patient Name: Beverly Howell Birthdate: Feb 08, 1941 Sex: female Admission Date (Current Location): 04/21/2020  [facility] and [place] Number:  Producer, television/film/video and Address:  [facility], [address] [place], [place] [address]      Provider Number: [id number]  Attending Physician Name and Address:  Maretta Bees, MD  Relative Name and Phone Number:  [name] (daughter) [phone]    Current Level of Care: Hospital Recommended Level of Care: Skilled Nursing Facility Prior Approval Number:    Date Approved/Denied:   PASRR Number: [id number]  Discharge Plan: SNF    Current Diagnoses: Patient Active Problem List   Diagnosis Date Noted   Acute CVA (cerebrovascular accident) (HCC) 04/22/2020   Acute respiratory failure due to COVID-19 [facility]) 04/22/2020   ARF (acute renal failure) (HCC) 04/22/2020   Essential hypertension 04/22/2020   Acute respiratory failure (HCC) 04/21/2020    Orientation RESPIRATION BLADDER Height & Weight     Self, Time, Situation, Place  O2 (4L nasal cannula) Incontinent, External catheter Weight: 183 lb 6.4 oz (83.2 kg) Height:  5\' 5"  (165.1 cm)  BEHAVIORAL SYMPTOMS/MOOD NEUROLOGICAL BOWEL NUTRITION STATUS      Continent Diet (see DC summary)  AMBULATORY STATUS COMMUNICATION OF NEEDS Skin   Limited Assist Verbally Normal                       Personal Care Assistance Level of Assistance  Bathing, Feeding, Dressing Bathing Assistance: Maximum assistance Feeding assistance: Limited assistance Dressing Assistance: Maximum assistance     Functional Limitations Info  Sight, Hearing, Speech Sight Info: Adequate Hearing Info: Adequate Speech Info: Adequate    SPECIAL CARE FACTORS FREQUENCY  PT (By licensed PT), OT (By licensed OT)     PT Frequency: 5X per week OT Frequency: 5X per week            Contractures       Additional Factors Info  Isolation Precautions, Code Status, Allergies Code Status Info: FULL Allergies Info: NKA     Isolation Precautions Info: COVID-19     Current Medications (04/24/2020):  This is the current hospital active medication list Current Facility-Administered Medications  Medication Dose Route Frequency Provider Last Rate Last Admin   0.9 %  sodium chloride infusion   Intravenous Continuous 13/04/2020, MD 10 mL/hr at 04/23/20 1549 Rate Change at 04/23/20 1549   acetaminophen (TYLENOL) tablet 650 mg  650 mg Oral Q4H PRN 13/10/21, MD       Or   acetaminophen (TYLENOL) 160 MG/5ML solution 650 mg  650 mg Per Tube Q4H PRN Eduard Clos, MD       Or   acetaminophen (TYLENOL) suppository 650 mg  650 mg Rectal Q4H PRN Eduard Clos, MD       amLODipine (NORVASC) tablet 5 mg  5 mg Oral Daily Ghimire, Shanker M, MD   5 mg at 04/24/20 1010   apixaban (ELIQUIS) tablet 10 mg  10 mg Oral BID 13/11/21, RPH   10 mg at 04/24/20 1007   Followed by   13/11/21 ON 04/30/2020] apixaban (ELIQUIS) tablet 5 mg  5 mg Oral BID 05/02/2020, RPH       atorvastatin (LIPITOR) tablet 40 mg  40 mg Oral Daily Earnie Larsson, MD   40 mg at 04/24/20 1007   hydrALAZINE (APRESOLINE) injection 10  mg  10 mg Intravenous Q4H PRN Eduard Clos, MD       pantoprazole (PROTONIX) EC tablet 40 mg  40 mg Oral Daily Maretta Bees, MD   40 mg at 04/24/20 1007   [START ON 04/25/2020] predniSONE (DELTASONE) tablet 50 mg  50 mg Oral Daily Ghimire, Werner Lean, MD       remdesivir 100 mg in sodium chloride 0.9 % 100 mL IVPB  100 mg Intravenous Daily Eduard Clos, MD 200 mL/hr at 04/24/20 1008 100 mg at 04/24/20 1008     Discharge Medications: Please see discharge summary for a list of discharge medications.  Relevant Imaging Results:  Relevant Lab Results:   Additional Information SSN: [id number]  [place] Pink, Student-Social  Work

## 2020-04-24 NOTE — Discharge Instructions (Signed)
Information on my medicine - ELIQUIS (apixaban)  This medication education was reviewed with me or my healthcare representative as part of my discharge preparation.    Why was Eliquis prescribed for you? Eliquis was prescribed to treat blood clots that may have been found in the veins of your legs (deep vein thrombosis) or in your lungs (pulmonary embolism) and to reduce the risk of them occurring again.  What do You need to know about Eliquis ? The starting dose is 10 mg (two 5 mg tablets) taken TWICE daily for the FIRST SEVEN (7) DAYS, then on 04/30/20  the dose is reduced to ONE 5 mg tablet taken TWICE daily.  Eliquis may be taken with or without food.   Try to take the dose about the same time in the morning and in the evening. If you have difficulty swallowing the tablet whole please discuss with your pharmacist how to take the medication safely.  Take Eliquis exactly as prescribed and DO NOT stop taking Eliquis without talking to the doctor who prescribed the medication.  Stopping may increase your risk of developing a new blood clot.  Refill your prescription before you run out.  After discharge, you should have regular check-up appointments with your healthcare provider that is prescribing your Eliquis.    What do you do if you miss a dose? If a dose of ELIQUIS is not taken at the scheduled time, take it as soon as possible on the same day and twice-daily administration should be resumed. The dose should not be doubled to make up for a missed dose.  Important Safety Information A possible side effect of Eliquis is bleeding. You should call your healthcare provider right away if you experience any of the following: ? Bleeding from an injury or your nose that does not stop. ? Unusual colored urine (red or dark brown) or unusual colored stools (red or black). ? Unusual bruising for unknown reasons. ? A serious fall or if you hit your head (even if there is no bleeding).  Some  medicines may interact with Eliquis and might increase your risk of bleeding or clotting while on Eliquis. To help avoid this, consult your healthcare provider or pharmacist prior to using any new prescription or non-prescription medications, including herbals, vitamins, non-steroidal anti-inflammatory drugs (NSAIDs) and supplements.  This website has more information on Eliquis (apixaban): http://www.eliquis.com/eliquis/home

## 2020-04-25 LAB — COMPREHENSIVE METABOLIC PANEL
ALT: 23 U/L (ref 0–44)
AST: 29 U/L (ref 15–41)
Albumin: 2.8 g/dL — ABNORMAL LOW (ref 3.5–5.0)
Alkaline Phosphatase: 75 U/L (ref 38–126)
Anion gap: 13 (ref 5–15)
BUN: 29 mg/dL — ABNORMAL HIGH (ref 8–23)
CO2: 24 mmol/L (ref 22–32)
Calcium: 8.8 mg/dL — ABNORMAL LOW (ref 8.9–10.3)
Chloride: 105 mmol/L (ref 98–111)
Creatinine, Ser: 1.25 mg/dL — ABNORMAL HIGH (ref 0.44–1.00)
GFR, Estimated: 44 mL/min — ABNORMAL LOW (ref >60)
Glucose, Bld: 144 mg/dL — ABNORMAL HIGH (ref 70–99)
Potassium: 3.6 mmol/L (ref 3.5–5.1)
Sodium: 142 mmol/L (ref 135–145)
Total Bilirubin: 1.4 mg/dL — ABNORMAL HIGH (ref 0.3–1.2)
Total Protein: 6.3 g/dL — ABNORMAL LOW (ref 6.5–8.1)

## 2020-04-25 LAB — CBC WITH DIFFERENTIAL/PLATELET
Abs Immature Granulocytes: 0.3 10*3/uL — ABNORMAL HIGH (ref 0.00–0.07)
Basophils Absolute: 0 10*3/uL (ref 0.0–0.1)
Basophils Relative: 0 %
Eosinophils Absolute: 0 10*3/uL (ref 0.0–0.5)
Eosinophils Relative: 0 %
HCT: 36.8 % (ref 36.0–46.0)
Hemoglobin: 12.1 g/dL (ref 12.0–15.0)
Immature Granulocytes: 1 %
Lymphocytes Relative: 8 %
Lymphs Abs: 1.6 10*3/uL (ref 0.7–4.0)
MCH: 29.9 pg (ref 26.0–34.0)
MCHC: 32.9 g/dL (ref 30.0–36.0)
MCV: 90.9 fL (ref 80.0–100.0)
Monocytes Absolute: 0.9 10*3/uL (ref 0.1–1.0)
Monocytes Relative: 4 %
Neutro Abs: 17.9 10*3/uL — ABNORMAL HIGH (ref 1.7–7.7)
Neutrophils Relative %: 87 %
Platelets: 256 10*3/uL (ref 150–400)
RBC: 4.05 MIL/uL (ref 3.87–5.11)
RDW: 13.7 % (ref 11.5–15.5)
WBC: 20.7 10*3/uL — ABNORMAL HIGH (ref 4.0–10.5)
nRBC: 0.2 % (ref 0.0–0.2)

## 2020-04-25 LAB — BRAIN NATRIURETIC PEPTIDE: B Natriuretic Peptide: 103.4 pg/mL — ABNORMAL HIGH (ref 0.0–100.0)

## 2020-04-25 LAB — GLUCOSE, CAPILLARY
Glucose-Capillary: 125 mg/dL — ABNORMAL HIGH (ref 70–99)
Glucose-Capillary: 127 mg/dL — ABNORMAL HIGH (ref 70–99)
Glucose-Capillary: 128 mg/dL — ABNORMAL HIGH (ref 70–99)
Glucose-Capillary: 158 mg/dL — ABNORMAL HIGH (ref 70–99)

## 2020-04-25 LAB — PROCALCITONIN: Procalcitonin: 1.09 ng/mL

## 2020-04-25 LAB — C-REACTIVE PROTEIN: CRP: 7.2 mg/dL — ABNORMAL HIGH (ref <1.0)

## 2020-04-25 LAB — D-DIMER, QUANTITATIVE: D-Dimer, Quant: >20 ug/mL-FEU — ABNORMAL HIGH (ref 0.00–0.50)

## 2020-04-25 MED ORDER — FUROSEMIDE 10 MG/ML IJ SOLN
40.0000 mg | Freq: Once | INTRAMUSCULAR | Status: AC
  Administered 2020-04-25: 40 mg via INTRAVENOUS
  Filled 2020-04-25: qty 4

## 2020-04-25 MED ORDER — METOPROLOL TARTRATE 25 MG PO TABS
25.0000 mg | ORAL_TABLET | Freq: Two times a day (BID) | ORAL | Status: DC
  Administered 2020-04-25 – 2020-04-28 (×6): 25 mg via ORAL
  Filled 2020-04-25 (×6): qty 1

## 2020-04-25 NOTE — Progress Notes (Signed)
PROGRESS NOTE                                                                                                                                                                                                             Patient Demographics:    Beverly Howell, is a 79 y.o. female, DOB - [birth year], OEV:[id number]  Outpatient Primary MD for the patient is System, Provider Not In   Admit date - 04/21/2020   LOS - 4  Chief Complaint  Patient presents with  . Chest Pain       Brief Narrative: Patient is a 79 y.o. female with PMHx of HTN-who presented with left-sided weakness and shortness of breath-was found to have acute CVA along with severe hypoxic respiratory failure due to COVID-19 pneumonitis.  See below for further details.  COVID-19 vaccinated status: Unvaccinated  Significant Events: 11/8>> presented to [facility] with left-sided weakness due to CVA and severe hypoxia due to COVID-19 pneumonia  Significant studies: 11/8>> chest x-ray: Hazy bilateral airspace opacities 11/8>> CT head: Small infarct in the posterior right insula. 11/9>> MRI brain: Acute ischemia in the right MCA territory 11/9>> MRI brain: No large vessel occlusion 11/9>> carotid Doppler: No significant stenosis 11/9>> lower extremity Doppler: Bilateral lower extremity DVT and multiple veins. 11/9>> A1c: 5.5 11/9>> LDL: 93 11/10>> Echo: EF 60-65%, grade 1 diastolic dysfunction, RV systolic function is normal.  [id number] medications: Steroids: 11/8>> Remdesivir: 11/8>> Actemra: 11/8 x 1  Antibiotics: None  Microbiology data: 11/8 >>blood culture: No growth  Procedures: None  Consults: None  DVT prophylaxis: apixaban (ELIQUIS) tablet 10 mg  apixaban (ELIQUIS) tablet 5 mg   Subjective:   Continues to improve-briefly titrated to room air earlier this morning.  Still very symptomatic with minimal ambulation.   Assessment  & Plan :   Acute  Hypoxic Resp Failure due to Covid 19 Viral pneumonia: Had severe hypoxemia when she first presented to the hospital-remarkable improvement over the past few days-titrated to room air earlier this morning.continue Remdesivir-continue steroids with taper.  Procalcitonin level only minimally elevated-doubt any concurrent bacterial pneumonia given clinical improvement.  Has slightly more swelling of her lower extremities today-we will give 1 dose of IV Lasix to try and maintain negative balance.  Fever: afebrile O2 requirements:  SpO2: 90 % O2 Flow Rate (L/min): 4 L/min   COVID-19 Labs:  Recent Labs    04/23/20 0136 04/24/20 0154 04/25/20 0052  DDIMER >20.00* >20.00* >20.00*  CRP 25.4* 13.2* 7.2*       Component Value Date/Time   BNP 103.4 (H) 04/25/2020 0052    Recent Labs  Lab 04/23/20 1308 04/24/20 0154 04/25/20 0052  PROCALCITON 2.54 1.58 1.09    Lab Results  Component Value Date   SARSCOV2NAA POSITIVE (A) 04/21/2020    Prone/Incentive Spirometry: encouraged patient to lie prone for 3-4 hours at a time for a total of 16 hours a day, and to encourage incentive spirometry use 3-4/hour.  Bilateral lower extremity DVT: Likely provoked by COVID-19-initially on IV heparin-but has been switched to Eliquis.  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  Left-sided weakness has improved-SLP evaluation completed on 11/10-has been started on a diet.  Continue PT/OT-recommendations are for CIR-but may require SNF as well.  Appreciate stroke team MD evaluation-Per my conversation with Dr. Pearlean Brownie on 11/11-okay to remain on Eliquis-aspirin not needed.  Continue high intensity statin.  HTN: BP creeping up-continue amlodipine-start low-dose Lopressor.  Follow and optimize.  AKI on CKD stage IIIa: AKI likely hemodynamically mediated-improved with supportive care.  Avoid nephrotoxic agents.    Obesity: Estimated body mass index is 30.52 kg/m as calculated from the following:   Height as of this encounter: 5\' 5"  (1.651 m).   Weight as of this encounter: 83.2 kg.   GI prophylaxis: PPI  ABG:    Component Value Date/Time   TCO2 27 04/21/2020 2117    Vent Settings: N/A  Condition - Guarded  Family Communication  :  Daughter 2118 [phone]) updated over the phone 11/12  Code Status :  Full Code  Diet :  Diet Order            Diet regular Room service appropriate? Yes; Fluid consistency: Thin  Diet effective now                  Disposition Plan  :   Status is: Inpatient  Remains inpatient appropriate because:Inpatient level of care appropriate due to severity of illness   Dispo: The patient is from: Home              Anticipated d/c is to: TBD              Anticipated d/c date is: > 3 days              Patient currently is not medically stable to d/c.   Barriers to discharge: Hypoxia requiring O2 supplementation/complete 5 days of IV Remdesivir  Antimicorbials  :    Anti-infectives (From admission, onward)   Start     Dose/Rate Route Frequency Ordered Stop   04/23/20 1000  remdesivir 100 mg in sodium chloride 0.9 % 100 mL IVPB       "Followed by" Linked Group Details   100 mg 200 mL/hr over 30 Minutes Intravenous Daily 04/22/20 0233 04/27/20 0959   04/22/20 0400  remdesivir 200 mg in sodium chloride 0.9% 250 mL IVPB       "Followed by" Linked Group Details   200 mg 580 mL/hr over 30 Minutes Intravenous Once 04/22/20 0233 04/22/20 0537      Inpatient Medications  Scheduled Meds: . amLODipine  5 mg Oral Daily  . apixaban  10 mg Oral BID   Followed by  . [START ON 04/30/2020] apixaban  5 mg  Oral BID  . atorvastatin  40 mg Oral Daily  . pantoprazole  40 mg Oral Daily  . predniSONE  50 mg Oral Daily   Continuous Infusions: . remdesivir 100 mg in NS 100 mL 100 mg (04/25/20 1008)   PRN Meds:.acetaminophen **OR** acetaminophen (TYLENOL)  oral liquid 160 mg/5 mL **OR** acetaminophen, hydrALAZINE   Time Spent in minutes 25   See all Orders from today for further details   Jeoffrey MassedShanker Cammi Consalvo M.D on 04/25/2020 at 3:21 PM  To page go to [email or web address] - use universal password  [facility] -  Office  [phone]    Objective:   Vitals:   04/25/20 0500 04/25/20 0853 04/25/20 1300 04/25/20 1452  BP: (!) 157/85 (!) 170/96 (!) 159/80 (!) 159/80  Pulse: 94 82 96 (!) 102  Resp: (!) 23 (!) 24 (!) 31 (!) 28  Temp: 98.5 F (36.9 C) 97.6 F (36.4 C) 97.6 F (36.4 C) 97.6 F (36.4 C)  TempSrc: Axillary Oral Oral Oral  SpO2: 100% 95% 90% 90%  Weight:      Height:        Wt Readings from Last 3 Encounters:  04/22/20 83.2 kg  03/26/17 86.2 kg  02/03/14 87.5 kg     Intake/Output Summary (Last 24 hours) at 04/25/2020 1521 Last data filed at 04/25/2020 1404 Gross per 24 hour  Intake 180 ml  Output 1000 ml  Net -820 ml     Physical Exam Gen Exam:Alert awake-not in any distress HEENT:atraumatic, normocephalic Chest: B/L clear to auscultation anteriorly CVS:S1S2 regular Abdomen:soft non tender, non distended Extremities:no edema Neurology:Mild Left sided weakness. Skin: no rash   Data Review:    CBC Recent Labs  Lab 04/21/20 1950 04/21/20 1950 04/21/20 2117 04/22/20 0235 04/23/20 0136 04/24/20 0154 04/25/20 0052  WBC 12.8*  --   --  12.4* 16.2* 19.4* 20.7*  HGB 12.5   < > 14.6 11.3* 11.8* 12.0 12.1  HCT 39.0   < > 43.0 35.4* 37.0 37.0 36.8  PLT 213  --   --  167 175 256 256  MCV 93.3  --   --  94.4 93.9 94.1 90.9  MCH 29.9  --   --  30.1 29.9 30.5 29.9  MCHC 32.1  --   --  31.9 31.9 32.4 32.9  RDW 13.7  --   --  13.6 13.7 13.8 13.7  LYMPHSABS 1.7  --   --   --  1.3 1.3 1.6  MONOABS 1.1*  --   --   --  0.5 0.5 0.9  EOSABS 0.0  --   --   --  0.0 0.0 0.0  BASOSABS 0.0  --   --   --  0.0 0.0 0.0   < > = values in this interval not displayed.    Chemistries  Recent Labs  Lab 04/21/20 1950  04/21/20 1950 04/21/20 2117 04/22/20 0235 04/23/20 0136 04/24/20 0154 04/25/20 0052  NA 135  --  139  --  143 143 142  K 4.8  --  5.6*  --  4.4 4.0 3.6  CL 100  --  102  --  107 108 105  CO2 24  --   --   --  21* 21* 24  GLUCOSE 124*  --  110*  --  121* 138* 144*  BUN 28*  --  38*  --  27* 27* 29*  CREATININE 1.84*   < > 1.70* 1.78* 1.42* 1.13* 1.25*  CALCIUM 8.8*  --   --   --  8.2* 8.5* 8.8*  AST 31  --   --   --  35 31 29  ALT 16  --   --   --  20 20 23   ALKPHOS 58  --   --   --  63 73 75  BILITOT 1.3*  --   --   --  1.0 1.5* 1.4*   < > = values in this interval not displayed.   ------------------------------------------------------------------------------------------------------------------ No results for input(s): CHOL, HDL, LDLCALC, TRIG, CHOLHDL, LDLDIRECT in the last 72 hours.  Lab Results  Component Value Date   HGBA1C 5.5 04/22/2020   ------------------------------------------------------------------------------------------------------------------ No results for input(s): TSH, T4TOTAL, T3FREE, THYROIDAB in the last 72 hours.  Invalid input(s): FREET3 ------------------------------------------------------------------------------------------------------------------ No results for input(s): VITAMINB12, FOLATE, FERRITIN, TIBC, IRON, RETICCTPCT in the last 72 hours.  Coagulation profile Recent Labs  Lab 04/21/20 1950  INR 1.5*    Recent Labs    04/24/20 0154 04/25/20 0052  DDIMER >20.00* >20.00*    Cardiac Enzymes No results for input(s): CKMB, TROPONINI, MYOGLOBIN in the last 168 hours.  Invalid input(s): CK ------------------------------------------------------------------------------------------------------------------    Component Value Date/Time   BNP 103.4 (H) 04/25/2020 0052    Micro Results Recent Results (from the past 240 hour(s))  Respiratory Panel by RT PCR (Flu A&B, Covid) - Nasopharyngeal Swab     Status: Abnormal   Collection Time:  04/21/20  6:41 PM   Specimen: Nasopharyngeal Swab  Result Value Ref Range Status   SARS Coronavirus 2 by RT PCR POSITIVE (A) NEGATIVE Final    Comment: RESULT CALLED TO, READ BACK BY AND VERIFIED WITH: J FERRAIOLO 04/22/20 AT 0016 SK (NOTE) SARS-CoV-2 target nucleic acids are DETECTED.  SARS-CoV-2 RNA is generally detectable in upper respiratory specimens  during the acute phase of infection. Positive results are indicative of the presence of the identified virus, but do not rule out bacterial infection or co-infection with other pathogens not detected by the test. Clinical correlation with patient history and other diagnostic information is necessary to determine patient infection status. The expected result is Negative.  Fact Sheet for Patients:  https://www.moore.com/  Fact Sheet for Healthcare Providers: https://www.young.biz/  This test is not yet approved or cleared by the [place] FDA and  has been authorized for detection and/or diagnosis of SARS-CoV-2 by FDA under an Emergency Use Authorization (EUA).  This EUA will remain in effect (meaning this test can be use d) for the duration of  the COVID-19 declaration under Section 564(b)(1) of the Act, 21 U.S.C. section 360bbb-3(b)(1), unless the authorization is terminated or revoked sooner.      Influenza A by PCR NEGATIVE NEGATIVE Final   Influenza B by PCR NEGATIVE NEGATIVE Final    Comment: (NOTE) The Xpert Xpress SARS-CoV-2/FLU/RSV assay is intended as an aid in  the diagnosis of influenza from Nasopharyngeal swab specimens and  should not be used as a sole basis for treatment. Nasal washings and  aspirates are unacceptable for Xpert Xpress SARS-CoV-2/FLU/RSV  testing.  Fact Sheet for Patients: https://www.moore.com/  Fact Sheet for Healthcare Providers: https://www.young.biz/  This test is not yet approved or cleared by the [place] FDA and  has been authorized for detection and/or diagnosis of SARS-CoV-2 by  FDA under an Emergency Use Authorization (EUA). This EUA will remain  in effect (meaning this test can be used) for the duration of the  Covid-19 declaration under Section 564(b)(1) of the Act, 21  U.S.C. section 360bbb-3(b)(1), unless the authorization is  terminated or revoked. Performed at [facility], [address] [place], [place] [address]   Blood culture (routine x 2)     Status: None (Preliminary result)   Collection Time: 04/21/20  7:50 PM   Specimen: BLOOD  Result Value Ref Range Status   Specimen Description BLOOD SITE NOT SPECIFIED  Final   Special Requests   Final    BOTTLES DRAWN AEROBIC AND ANAEROBIC Blood Culture adequate volume   Culture   Final    NO GROWTH 4 DAYS Performed at [facility], [address], [place], [place] [address]    Report Status PENDING  Incomplete  Blood culture (routine x 2)     Status: None (Preliminary result)   Collection Time: 04/21/20  9:10 PM   Specimen: BLOOD  Result Value Ref Range Status   Specimen Description BLOOD SITE NOT SPECIFIED  Final   Special Requests   Final    BOTTLES DRAWN AEROBIC AND ANAEROBIC Blood Culture results may not be optimal due to an inadequate volume of blood received in culture bottles   Culture   Final    NO GROWTH 4 DAYS Performed at [facility], [address], [place], [place] [address]    Report Status PENDING  Incomplete    Radiology Reports DG Abd 1 View  Result Date: 04/22/2020 CLINICAL DATA:  MRI clearance.  Altered mental status. EXAM: ABDOMEN - 1 VIEW COMPARISON:  None. FINDINGS: The bowel gas pattern is normal. No radio-opaque calculi or other significant radiographic abnormality are seen. There appear to be hazy airspace opacities involving the lungs bilaterally. No metallic foreign body identified. There are degenerative changes of both hips, right worse than left. There are  degenerative changes of the lumbar spine. IMPRESSION: 1. No metallic foreign body identified. 2. Hazy airspace opacities involving the lungs bilaterally. 3. Nonobstructive bowel gas pattern. Electronically Signed   By: Katherine Mantle M.D.   On: 04/22/2020 00:23   CT Head Wo Contrast  Result Date: 04/21/2020 CLINICAL DATA:  79 year old female with intermittent dysarthria and left side weakness x1 week, progressive since 1400 hours on 04/20/2020. Hypoxia on initial presentation. EXAM: CT HEAD WITHOUT CONTRAST TECHNIQUE: Contiguous axial images were obtained from the base of the skull through the vertex without intravenous contrast. COMPARISON:  None. FINDINGS: Brain: Small chronic infarct in the left cerebellum (series 3, image 8). No acute intracranial hemorrhage identified. No midline shift, mass effect, or evidence of intracranial mass lesion. No ventriculomegaly. Mild for age scattered bilateral white matter hypodensity. Small age indeterminate hypodense area in the posterior right insula on series 3, image 15. No other no acute or subacute cortically based infarct identified. No supratentorial cortical encephalomalacia identified. Deep gray matter nuclei and brainstem appear within normal limits. Vascular: Calcified atherosclerosis at the skull base. No suspicious intracranial vascular hyperdensity. Skull: No acute osseous abnormality identified. Sinuses/Orbits: Small fluid level in the left maxillary sinus. Bubbly opacity there as well as in the right maxillary and sphenoid sinuses. Bubbly opacity in the posterior left ethmoid. Tympanic cavities and mastoids appear clear. Other: No acute orbit or scalp soft tissue finding identified. IMPRESSION: 1. Small infarct at the posterior right insula, age indeterminate but suspicious for subacute ischemia in this setting. No associated hemorrhage or mass effect. 2. Elsewhere mild for age cerebral white matter changes, and a small chronic left cerebellar infarct.  3. Acute paranasal sinus inflammation. Electronically Signed   By: Odessa Fleming M.D.   On: 04/21/2020  22:56   MR ANGIO HEAD WO CONTRAST  Result Date: 04/22/2020 CLINICAL DATA:  Follow-up examination for acute stroke. EXAM: MRI HEAD WITHOUT CONTRAST MRA HEAD WITHOUT CONTRAST TECHNIQUE: Multiplanar, multiecho pulse sequences of the brain and surrounding structures were obtained without intravenous contrast. Angiographic images of the head were obtained using MRA technique without contrast. COMPARISON:  Prior CT from 04/21/2020. FINDINGS: MRI HEAD FINDINGS Brain: Examination degraded by motion artifact. Generalized age-related cerebral atrophy. Mild chronic microvascular ischemic disease present within the periventricular and deep white matter both cerebral hemispheres. Small remote left cerebellar infarct noted. Patchy restricted diffusion seen involving the posterior right insula as well as the overlying right posterior frontal and parietal lobes, consistent with right MCA territory infarcts. No significant mass effect or associated hemorrhage. An embolic etiology is suspected given distribution. No other diffusion abnormality to suggest acute or subacute ischemia. Gray-white matter differentiation otherwise maintained. No other areas of encephalomalacia to suggest chronic cortical infarction on this motion degraded exam. No other definite foci of susceptibility artifact to suggest acute or chronic intracranial hemorrhage. No mass lesion, midline shift or mass effect. No hydrocephalus or extra-axial fluid collection. No made of a partially empty sella. Midline structures intact. Vascular: Major intracranial vascular flow voids are maintained. Skull and upper cervical spine: Craniocervical junction within normal limits. Multilevel cervical spondylosis noted within the upper cervical spine without high-grade stenosis. Bone marrow signal intensity normal. No scalp soft tissue abnormality. Sinuses/Orbits: Globes and  orbital soft tissues within normal limits. Scattered mucosal thickening noted throughout the ethmoidal air cells and maxillary sinuses. Superimposed air-fluid level noted within the left maxillary sinus. Trace bilateral mastoid effusions. Visualized nasopharynx within normal limits. Inner ear structures grossly normal. Other: None. MRA HEAD FINDINGS ANTERIOR CIRCULATION: Examination somewhat degraded by motion artifact. Visualized distal cervical segments of the internal carotid arteries are widely patent with antegrade flow. Petrous, cavernous, and supraclinoid ICAs demonstrate probable atheromatous irregularity without appreciable hemodynamically significant stenosis. A1 segments patent bilaterally. Normal anterior communicating artery complex. Anterior cerebral arteries patent to their distal aspects without stenosis. No M1 stenosis or occlusion. Normal MCA bifurcations. Distal MCA branches well perfused and fairly symmetric. POSTERIOR CIRCULATION: Vertebral arteries patent to the vertebrobasilar junction without stenosis. Both PICAs patent proximally. Basilar patent to its distal aspect without stenosis. Superior cerebral arteries patent bilaterally. Right PCA supplied via a hypoplastic right P1 segment and prominent right posterior communicating artery. Fetal type origin left PCA. Both PCAs perfused to their distal aspects without proximal flow-limiting stenosis. No aneurysm. IMPRESSION: MRI HEAD IMPRESSION: 1. 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 An embolic etiology is suspected. No significant mass effect. 2. Underlying age-related cerebral atrophy with mild chronic small vessel ischemic disease. Small remote left cerebellar infarct. MRA HEAD IMPRESSION: 1. Technically limited exam due to motion artifact. 2. Negative intracranial MRA for large vessel occlusion. No hemodynamically  significant or correctable stenosis. 3. Mild atheromatous irregularity about the carotid siphons without significant stenosis. Electronically Signed   By: Rise Mu M.D.   On: 04/22/2020 01:29   MR BRAIN WO CONTRAST  Result Date: 04/22/2020 CLINICAL DATA:  Follow-up examination for acute stroke. EXAM: MRI HEAD WITHOUT CONTRAST MRA HEAD WITHOUT CONTRAST TECHNIQUE: Multiplanar, multiecho pulse sequences of the brain and surrounding structures were obtained without intravenous contrast. Angiographic images of the head were obtained using MRA technique without contrast. COMPARISON:  Prior CT from 04/21/2020. FINDINGS: MRI HEAD FINDINGS Brain: Examination degraded  by motion artifact. Generalized age-related cerebral atrophy. Mild chronic microvascular ischemic disease present within the periventricular and deep white matter both cerebral hemispheres. Small remote left cerebellar infarct noted. Patchy restricted diffusion seen involving the posterior right insula as well as the overlying right posterior frontal and parietal lobes, consistent with right MCA territory infarcts. No significant mass effect or associated hemorrhage. An embolic etiology is suspected given distribution. No other diffusion abnormality to suggest acute or subacute ischemia. Gray-white matter differentiation otherwise maintained. No other areas of encephalomalacia to suggest chronic cortical infarction on this motion degraded exam. No other definite foci of susceptibility artifact to suggest acute or chronic intracranial hemorrhage. No mass lesion, midline shift or mass effect. No hydrocephalus or extra-axial fluid collection. No made of a partially empty sella. Midline structures intact. Vascular: Major intracranial vascular flow voids are maintained. Skull and upper cervical spine: Craniocervical junction within normal limits. Multilevel cervical spondylosis noted within the upper cervical spine without high-grade stenosis. Bone  marrow signal intensity normal. No scalp soft tissue abnormality. Sinuses/Orbits: Globes and orbital soft tissues within normal limits. Scattered mucosal thickening noted throughout the ethmoidal air cells and maxillary sinuses. Superimposed air-fluid level noted within the left maxillary sinus. Trace bilateral mastoid effusions. Visualized nasopharynx within normal limits. Inner ear structures grossly normal. Other: None. MRA HEAD FINDINGS ANTERIOR CIRCULATION: Examination somewhat degraded by motion artifact. Visualized distal cervical segments of the internal carotid arteries are widely patent with antegrade flow. Petrous, cavernous, and supraclinoid ICAs demonstrate probable atheromatous irregularity without appreciable hemodynamically significant stenosis. A1 segments patent bilaterally. Normal anterior communicating artery complex. Anterior cerebral arteries patent to their distal aspects without stenosis. No M1 stenosis or occlusion. Normal MCA bifurcations. Distal MCA branches well perfused and fairly symmetric. POSTERIOR CIRCULATION: Vertebral arteries patent to the vertebrobasilar junction without stenosis. Both PICAs patent proximally. Basilar patent to its distal aspect without stenosis. Superior cerebral arteries patent bilaterally. Right PCA supplied via a hypoplastic right P1 segment and prominent right posterior communicating artery. Fetal type origin left PCA. Both PCAs perfused to their distal aspects without proximal flow-limiting stenosis. No aneurysm. IMPRESSION: MRI HEAD IMPRESSION: 1. 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 An embolic etiology is suspected. No significant mass effect. 2. Underlying age-related cerebral atrophy with mild chronic small vessel ischemic disease. Small remote left cerebellar infarct. MRA HEAD IMPRESSION: 1. Technically limited exam due to motion  artifact. 2. Negative intracranial MRA for large vessel occlusion. No hemodynamically significant or correctable stenosis. 3. Mild atheromatous irregularity about the carotid siphons without significant stenosis. Electronically Signed   By: Rise Mu M.D.   On: 04/22/2020 01:29   DG Chest Portable 1 View  Result Date: 04/21/2020 CLINICAL DATA:  Hypoxia EXAM: PORTABLE CHEST 1 VIEW COMPARISON:  None. FINDINGS: The heart size is enlarged. There are hazy bilateral airspace opacities. There is no pneumothorax. No large pleural effusion. There are end-stage degenerative changes of both glenohumeral joints. IMPRESSION: Cardiomegaly with hazy bilateral airspace opacities concerning for pulmonary edema or an atypical infectious process. Electronically Signed   By: Katherine Mantle M.D.   On: 04/21/2020 20:21   ECHOCARDIOGRAM COMPLETE  Result Date: 04/23/2020    ECHOCARDIOGRAM REPORT   Patient Name:   CRYSTIN LECHTENBERG Date of Exam: 04/23/2020 Medical Rec #:  [id number]     Height:       65.0 in Accession #:    [id number]    Weight:  183.4 lb Date of Birth:  Jul 09, 1940      BSA:          1.907 m Patient Age:    79 years      BP:           151/94 mmHg Patient Gender: F             HR:           79 bpm. Exam Location:  Inpatient Procedure: 2D Echo, Cardiac Doppler and Color Doppler Indications:    Stroke 434.91 / I163.9  History:        Patient has no prior history of Echocardiogram examinations.                 Risk Factors:Hypertension and Dyslipidemia. COVID-19 Positive.  Sonographer:    Elmarie Shiley Dance Referring Phys: [id number] ARSHAD N KAKRAKANDY IMPRESSIONS  1. Left ventricular ejection fraction, by estimation, is 60 to 65%. The left ventricle has normal function. The left ventricle has no regional wall motion abnormalities. Left ventricular diastolic parameters are consistent with Grade I diastolic dysfunction (impaired relaxation).  2. Right ventricular systolic function is normal. The right ventricular  size is normal. There is normal pulmonary artery systolic pressure. The estimated right ventricular systolic pressure is 27.4 mmHg.  3. The mitral valve is normal in structure. Trivial mitral valve regurgitation. No evidence of mitral stenosis.  4. The aortic valve is normal in structure. There is moderate calcification of the aortic valve. There is moderate thickening of the aortic valve. Aortic valve regurgitation is trivial. Mild to moderate aortic valve sclerosis/calcification is present, without any evidence of aortic stenosis. Aortic valve mean gradient measures 8.5 mmHg. Aortic valve Vmax measures 2.13 m/s.  5. The inferior vena cava is normal in size with greater than 50% respiratory variability, suggesting right atrial pressure of 3 mmHg. Conclusion(s)/Recommendation(s): No intracardiac source of embolism detected on this transthoracic study. A transesophageal echocardiogram is recommended to exclude cardiac source of embolism if clinically indicated. FINDINGS  Left Ventricle: Left ventricular ejection fraction, by estimation, is 60 to 65%. The left ventricle has normal function. The left ventricle has no regional wall motion abnormalities. The left ventricular internal cavity size was normal in size. There is  no left ventricular hypertrophy. Left ventricular diastolic parameters are consistent with Grade I diastolic dysfunction (impaired relaxation). Right Ventricle: The right ventricular size is normal. No increase in right ventricular wall thickness. Right ventricular systolic function is normal. There is normal pulmonary artery systolic pressure. The tricuspid regurgitant velocity is 2.47 m/s, and  with an assumed right atrial pressure of 3 mmHg, the estimated right ventricular systolic pressure is 27.4 mmHg. Left Atrium: Left atrial size was normal in size. Right Atrium: Right atrial size was normal in size. Pericardium: There is no evidence of pericardial effusion. Mitral Valve: The mitral valve is  normal in structure. Trivial mitral valve regurgitation. No evidence of mitral valve stenosis. Tricuspid Valve: The tricuspid valve is normal in structure. Tricuspid valve regurgitation is mild . No evidence of tricuspid stenosis. Aortic Valve: The aortic valve is normal in structure. There is moderate calcification of the aortic valve. There is moderate thickening of the aortic valve. Aortic valve regurgitation is trivial. Mild to moderate aortic valve sclerosis/calcification is present, without any evidence of aortic stenosis. Aortic valve mean gradient measures 8.5 mmHg. Aortic valve peak gradient measures 18.2 mmHg. Aortic valve area, by VTI measures 1.58 cm. Pulmonic Valve: The pulmonic valve was normal in structure. Pulmonic  valve regurgitation is not visualized. No evidence of pulmonic stenosis. Aorta: The aortic root is normal in size and structure. Venous: The inferior vena cava is normal in size with greater than 50% respiratory variability, suggesting right atrial pressure of 3 mmHg. IAS/Shunts: No atrial level shunt detected by color flow Doppler.  LEFT VENTRICLE PLAX 2D LVIDd:         3.90 cm  Diastology LVIDs:         2.72 cm  LV e' medial:    4.35 cm/s LV PW:         0.92 cm  LV E/e' medial:  14.5 LV IVS:        1.15 cm  LV e' lateral:   6.42 cm/s LVOT diam:     2.00 cm  LV E/e' lateral: 9.8 LV SV:         63 LV SV Index:   33 LVOT Area:     3.14 cm  RIGHT VENTRICLE             IVC RV Basal diam:  2.65 cm     IVC diam: 1.81 cm RV S prime:     12.00 cm/s TAPSE (M-mode): 2.0 cm LEFT ATRIUM             Index       RIGHT ATRIUM          Index LA diam:        3.20 cm 1.68 cm/m  RA Area:     9.10 cm LA Vol (A2C):   56.4 ml 29.58 ml/m RA Volume:   14.70 ml 7.71 ml/m LA Vol (A4C):   59.4 ml 31.15 ml/m LA Biplane Vol: 60.1 ml 31.52 ml/m  AORTIC VALVE AV Area (Vmax):    1.49 cm AV Area (Vmean):   1.39 cm AV Area (VTI):     1.58 cm AV Vmax:           213.50 cm/s AV Vmean:          135.000 cm/s AV VTI:             0.395 m AV Peak Grad:      18.2 mmHg AV Mean Grad:      8.5 mmHg LVOT Vmax:         101.40 cm/s LVOT Vmean:        59.550 cm/s LVOT VTI:          0.199 m LVOT/AV VTI ratio: 0.50  AORTA Ao Root diam: 2.80 cm Ao Asc diam:  3.40 cm MITRAL VALVE                TRICUSPID VALVE MV Area (PHT): 2.45 cm     TR Peak grad:   24.4 mmHg MV Decel Time: 310 msec     TR Vmax:        247.00 cm/s MV E velocity: 62.90 cm/s MV A velocity: 103.00 cm/s  SHUNTS MV E/A ratio:  0.61         Systemic VTI:  0.20 m                             Systemic Diam: 2.00 cm Donato Schultz MD Electronically signed by Donato Schultz MD Signature Date/Time: 04/23/2020/2:48:24 PM    Final    VAS US CAROTID (at [facility] and [facility] only)  Result Date: 04/22/2020 Carotid Arterial Duplex Study Indications:       CVA  and DVT positive. Risk Factors:      Hypertension. Other Factors:     COVID 19 positive. Comparison Study:  No prior studies. Performing Technologist: Chanda Busing RVT  Examination Guidelines: A complete evaluation includes B-mode imaging, spectral Doppler, color Doppler, and power Doppler as needed of all accessible portions of each vessel. Bilateral testing is considered an integral part of a complete examination. Limited examinations for reoccurring indications may be performed as noted.  Right Carotid Findings: +----------+--------+--------+--------+-----------------------+--------+           PSV cm/sEDV cm/sStenosisPlaque Description     Comments +----------+--------+--------+--------+-----------------------+--------+ CCA Prox  56      5               smooth and heterogenous         +----------+--------+--------+--------+-----------------------+--------+ CCA Distal49      11              smooth and heterogenous         +----------+--------+--------+--------+-----------------------+--------+ ICA Prox  48      14              smooth and heterogenous          +----------+--------+--------+--------+-----------------------+--------+ ICA Distal87      24                                     tortuous +----------+--------+--------+--------+-----------------------+--------+ ECA       59      6                                               +----------+--------+--------+--------+-----------------------+--------+ +----------+--------+-------+--------+-------------------+           PSV cm/sEDV cmsDescribeArm Pressure (mmHG) +----------+--------+-------+--------+-------------------+ [id number]                                         +----------+--------+-------+--------+-------------------+ +---------+--------+--+--------+--+---------+ VertebralPSV cm/s59EDV cm/s13Antegrade +---------+--------+--+--------+--+---------+  Left Carotid Findings: +----------+--------+--------+--------+-----------------------+--------+           PSV cm/sEDV cm/sStenosisPlaque Description     Comments +----------+--------+--------+--------+-----------------------+--------+ CCA Prox  97      19              smooth and heterogenous         +----------+--------+--------+--------+-----------------------+--------+ CCA Distal49      11              smooth and heterogenous         +----------+--------+--------+--------+-----------------------+--------+ ICA Prox  37      11              smooth and heterogenous         +----------+--------+--------+--------+-----------------------+--------+ ICA Distal59      22                                     tortuous +----------+--------+--------+--------+-----------------------+--------+ ECA       44      5                                               +----------+--------+--------+--------+-----------------------+--------+ +----------+--------+--------+--------+-------------------+  PSV cm/sEDV cm/sDescribeArm Pressure (mmHG)  +----------+--------+--------+--------+-------------------+ [id number]                                          +----------+--------+--------+--------+-------------------+ +---------+--------+--+--------+--+---------+ VertebralPSV cm/s33EDV cm/s10Antegrade +---------+--------+--+--------+--+---------+   Summary: Right Carotid: Velocities in the right ICA are consistent with a 1-39% stenosis. Left Carotid: Velocities in the left ICA are consistent with a 1-39% stenosis. Vertebrals: Bilateral vertebral arteries demonstrate antegrade flow. *See table(s) above for measurements and observations.  Electronically signed by Delia Heady MD on 04/22/2020 at 1:17:39 PM.    Final    VAS [place] LOWER EXTREMITY VENOUS (DVT)  Result Date: 04/22/2020  Lower Venous DVT Study Indications: Elevated Ddimer.  Risk Factors: COVID 19 positive. Comparison Study: No prior studies. Performing Technologist: Chanda Busing RVT  Examination Guidelines: A complete evaluation includes B-mode imaging, spectral Doppler, color Doppler, and power Doppler as needed of all accessible portions of each vessel. Bilateral testing is considered an integral part of a complete examination. Limited examinations for reoccurring indications may be performed as noted. The reflux portion of the exam is performed with the patient in reverse Trendelenburg.  +---------+---------------+---------+-----------+----------+-----------------+ RIGHT    CompressibilityPhasicitySpontaneityPropertiesThrombus Aging    +---------+---------------+---------+-----------+----------+-----------------+ CFV      Full           Yes      Yes                                    +---------+---------------+---------+-----------+----------+-----------------+ SFJ      Full                                                           +---------+---------------+---------+-----------+----------+-----------------+ FV Prox  Full                                                            +---------+---------------+---------+-----------+----------+-----------------+ FV Mid   Partial        No       No                   Age Indeterminate +---------+---------------+---------+-----------+----------+-----------------+ FV DistalNone           No       No                   Age Indeterminate +---------+---------------+---------+-----------+----------+-----------------+ PFV      Full                                                           +---------+---------------+---------+-----------+----------+-----------------+ POP      None           No       No  Age Indeterminate +---------+---------------+---------+-----------+----------+-----------------+ PTV      None                                         Age Indeterminate +---------+---------------+---------+-----------+----------+-----------------+ PERO     None                                         Age Indeterminate +---------+---------------+---------+-----------+----------+-----------------+ Gastroc  None                                         Age Indeterminate +---------+---------------+---------+-----------+----------+-----------------+   +---------+---------------+---------+-----------+----------+--------------+ LEFT     CompressibilityPhasicitySpontaneityPropertiesThrombus Aging +---------+---------------+---------+-----------+----------+--------------+ CFV      Full           Yes      Yes                                 +---------+---------------+---------+-----------+----------+--------------+ SFJ      Full                                                        +---------+---------------+---------+-----------+----------+--------------+ FV Prox  Full                                                        +---------+---------------+---------+-----------+----------+--------------+ FV Mid   Full                                                         +---------+---------------+---------+-----------+----------+--------------+ FV DistalFull                                                        +---------+---------------+---------+-----------+----------+--------------+ PFV      Full                                                        +---------+---------------+---------+-----------+----------+--------------+ POP      None           No       No                   Acute          +---------+---------------+---------+-----------+----------+--------------+ PTV      None  Acute          +---------+---------------+---------+-----------+----------+--------------+ PERO     None                                         Acute          +---------+---------------+---------+-----------+----------+--------------+    Summary: RIGHT: - Findings consistent with age indeterminate deep vein thrombosis involving the right femoral vein, right popliteal vein, right posterior tibial veins, right peroneal veins, and right gastrocnemius veins. - No cystic structure found in the popliteal fossa.  LEFT: - Findings consistent with acute deep vein thrombosis involving the left popliteal vein, left posterior tibial veins, and left peroneal veins. - No cystic structure found in the popliteal fossa.  *See table(s) above for measurements and observations. Electronically signed by Gretta Began MD on 04/22/2020 at 2:50:20 PM.    Final

## 2020-04-25 NOTE — Progress Notes (Signed)
   04/24/20 1726  Assess: MEWS Score  Temp 98.6 F (37 C)  BP (!) 162/93  Pulse Rate (!) 107  ECG Heart Rate (!) 103  Resp (!) 32  SpO2 90 %  Assess: MEWS Score  MEWS Temp 0  MEWS Systolic 0  MEWS Pulse 1  MEWS RR 2  MEWS LOC 0  MEWS Score 3  MEWS Score Color Yellow  Assess: if the MEWS score is Yellow or Red  Were vital signs taken at a resting state? Yes  Focused Assessment No change from prior assessment  Early Detection of Sepsis Score *See Row Information* Medium  MEWS guidelines implemented *See Row Information* Yes  Take Vital Signs  Increase Vital Sign Frequency  Yellow: Q 2hr X 2 then Q 4hr X 2, if remains yellow, continue Q 4hrs  Escalate  MEWS: Escalate Yellow: discuss with charge nurse/RN and consider discussing with provider and RRT  Notify: Provider  Provider Name/Title Dr. Delmer Islam  Date Provider Notified 04/24/20  Time Provider Notified 1630  Notification Type Page  Notification Reason Other (Comment) (MEWS )  Response No new orders  Date of Provider Response 04/24/20  Time of Provider Response 1635  Document  Patient Outcome Stabilized after interventions  Progress note created (see row info) Yes

## 2020-04-25 NOTE — Progress Notes (Signed)
Physical Therapy Treatment [name] MRN: [id number] DOB: 1941/05/25 Today's Date: 04/25/2020    History of Present Illness Pt is 79 yo female with PMH of HTN.  She presented with L sided weakness and SOB. She was found to have acute R MCA ischemic CVA , bil LE DVT, and severe hypoxic resp failure due to COVID 19 PNA.    PT Comments    Pt was able to increase activity but still limited due to elevated HR and RR with activity.  She ambulated to/from bathroom with multiple extended seated rest breaks and cues for controlled pursed lip breathing. She also required frequent cues for safe transfer and ambulation techniques. Will need chair follow to further increase gait.    Follow Up Recommendations  CIR (if not CIR then SNF)     Equipment Recommendations  Rolling walker with 5" wheels;3in1 (PT) (further assessment next venue)    Recommendations for Other Services Rehab consult     Precautions / Restrictions Precautions Precautions: Fall Precaution Comments: watch O2 and HR    Mobility  Bed Mobility               General bed mobility comments: In recliner upon arrival  Transfers Overall transfer level: Needs assistance Equipment used: Rolling walker (2 wheeled) Transfers: Sit to/from Stand Sit to Stand: Min assist         General transfer comment: Performed sit to stand x 6 throughout session ;required cues each time for hand placement  Ambulation/Gait Ambulation/Gait assistance: Min assist Gait Distance (Feet): 6 Feet (6'x4) Assistive device: Rolling walker (2 wheeled)   Gait velocity: decreased   General Gait Details: Wanted to ambulate to bathroom, required 3 sitting rest breaks to get to bathroom and back due to increased RR, HR, and decreased sats.  Min A for steadying and cues/assist to keep RW close but still tended to push too far forward.   Stairs             Wheelchair Mobility    Modified Rankin (Stroke Patients  Only)       Balance Overall balance assessment: Needs assistance Sitting-balance support: No upper extremity supported Sitting balance-Leahy Scale: Good     Standing balance support: Bilateral upper extremity supported;During functional activity Standing balance-Leahy Scale: Poor Standing balance comment: Required RW in standing; Required assist w/ pericare in standing (donning underwear)                            Cognition Arousal/Alertness: Awake/alert Behavior During Therapy: WFL for tasks assessed/performed Overall Cognitive Status: Impaired/Different from baseline Area of Impairment: Following commands;Safety/judgement;Problem solving;Awareness;Orientation                 Orientation Level: Disoriented to;Time     Following Commands: Follows one step commands consistently;Follows multi-step commands with increased time Safety/Judgement: Decreased awareness of deficits Awareness: Emergent Problem Solving: Slow processing;Decreased initiation;Requires verbal cues;Requires tactile cues        Exercises      General Comments General comments (skin integrity, edema, etc.): Pt was on 2 LPM O2 with sats 93-95% with rest and dropped as low as 81% for short time with activity - O2 sats recovered in <1 min with cues for controlled rate pursed lip breathing.  RR up to 30's initially with activity but then 40's when in bathroom.  Required frequent cues for controlled breathing. Pt's HR was 95-105 bpm rest and up to 120-130's regularly  with activity.  Did have episodes with ambulation up to 150's briefly and even read 188 and 208 with toileting ADLs for 1-2 seconds but returned back to 120-130's.  Notified RN.  After pt positioned in chair with needs in reach nurse tech in to take glucose - notified them purewick was not back in place yet.      Pertinent Vitals/Pain Pain Assessment: No/denies pain    Home Living                      Prior Function             PT Goals (current goals can now be found in the care plan section) Acute Rehab PT Goals Patient Stated Goal: get stronger PT Goal Formulation: With patient Time For Goal Achievement: 05/07/20 Potential to Achieve Goals: Good Progress towards PT goals: Progressing toward goals    Frequency    Min 4X/week      PT Plan Current plan remains appropriate    Co-evaluation              AM-PAC PT "6 Clicks" Mobility   Outcome Measure  Help needed turning from your back to your side while in a flat bed without using bedrails?: A Lot Help needed moving from lying on your back to sitting on the side of a flat bed without using bedrails?: A Lot Help needed moving to and from a bed to a chair (including a wheelchair)?: A Lot Help needed standing up from a chair using your arms (e.g., wheelchair or bedside chair)?: A Lot Help needed to walk in hospital room?: A Lot Help needed climbing 3-5 steps with a railing? : A Lot 6 Click Score: 12    End of Session Equipment Utilized During Treatment: Gait belt;Oxygen Activity Tolerance: Patient limited by fatigue;Other (comment) (elevated HR and RR) Patient left: with chair alarm set;in chair;with call bell/phone within reach (nurse techs present to finish settling pt (Purewick no on)) Nurse Communication: Mobility status;Other (comment) (HR) PT Visit Diagnosis: Unsteadiness on feet (R26.81);Muscle weakness (generalized) (M62.81);Hemiplegia and hemiparesis;Other (comment) Hemiplegia - Right/Left: Left Hemiplegia - dominant/non-dominant: Non-dominant Hemiplegia - caused by: Cerebral infarction     Time: 1205-1238 PT Time Calculation (min) (ACUTE ONLY): 33 min  Charges:  $Gait Training: 8-22 mins $Therapeutic Activity: 8-22 mins                     Anise Salvo, PT Acute Rehab Services Pager [phone] [facility] [phone]     Rayetta Humphrey 04/25/2020, 1:23 PM

## 2020-04-26 LAB — CBC WITH DIFFERENTIAL/PLATELET
Abs Immature Granulocytes: 0.18 10*3/uL — ABNORMAL HIGH (ref 0.00–0.07)
Basophils Absolute: 0 10*3/uL (ref 0.0–0.1)
Basophils Relative: 0 %
Eosinophils Absolute: 0 10*3/uL (ref 0.0–0.5)
Eosinophils Relative: 0 %
HCT: 36.9 % (ref 36.0–46.0)
Hemoglobin: 12.1 g/dL (ref 12.0–15.0)
Immature Granulocytes: 1 %
Lymphocytes Relative: 10 %
Lymphs Abs: 1.5 10*3/uL (ref 0.7–4.0)
MCH: 30 pg (ref 26.0–34.0)
MCHC: 32.8 g/dL (ref 30.0–36.0)
MCV: 91.6 fL (ref 80.0–100.0)
Monocytes Absolute: 0.8 10*3/uL (ref 0.1–1.0)
Monocytes Relative: 5 %
Neutro Abs: 12.9 10*3/uL — ABNORMAL HIGH (ref 1.7–7.7)
Neutrophils Relative %: 84 %
Platelets: 243 10*3/uL (ref 150–400)
RBC: 4.03 MIL/uL (ref 3.87–5.11)
RDW: 13.7 % (ref 11.5–15.5)
WBC: 15.4 10*3/uL — ABNORMAL HIGH (ref 4.0–10.5)
nRBC: 0.4 % — ABNORMAL HIGH (ref 0.0–0.2)

## 2020-04-26 LAB — COMPREHENSIVE METABOLIC PANEL
ALT: 23 U/L (ref 0–44)
AST: 27 U/L (ref 15–41)
Albumin: 2.7 g/dL — ABNORMAL LOW (ref 3.5–5.0)
Alkaline Phosphatase: 68 U/L (ref 38–126)
Anion gap: 10 (ref 5–15)
BUN: 32 mg/dL — ABNORMAL HIGH (ref 8–23)
CO2: 25 mmol/L (ref 22–32)
Calcium: 8.4 mg/dL — ABNORMAL LOW (ref 8.9–10.3)
Chloride: 104 mmol/L (ref 98–111)
Creatinine, Ser: 1.31 mg/dL — ABNORMAL HIGH (ref 0.44–1.00)
GFR, Estimated: 42 mL/min — ABNORMAL LOW (ref >60)
Glucose, Bld: 150 mg/dL — ABNORMAL HIGH (ref 70–99)
Potassium: 3.8 mmol/L (ref 3.5–5.1)
Sodium: 139 mmol/L (ref 135–145)
Total Bilirubin: 1.1 mg/dL (ref 0.3–1.2)
Total Protein: 6.2 g/dL — ABNORMAL LOW (ref 6.5–8.1)

## 2020-04-26 LAB — CULTURE, BLOOD (ROUTINE X 2)
Culture: NO GROWTH
Culture: NO GROWTH
Special Requests: ADEQUATE

## 2020-04-26 LAB — GLUCOSE, CAPILLARY
Glucose-Capillary: 121 mg/dL — ABNORMAL HIGH (ref 70–99)
Glucose-Capillary: 151 mg/dL — ABNORMAL HIGH (ref 70–99)
Glucose-Capillary: 165 mg/dL — ABNORMAL HIGH (ref 70–99)
Glucose-Capillary: 184 mg/dL — ABNORMAL HIGH (ref 70–99)
Glucose-Capillary: 204 mg/dL — ABNORMAL HIGH (ref 70–99)

## 2020-04-26 LAB — D-DIMER, QUANTITATIVE: D-Dimer, Quant: 17.76 ug/mL-FEU — ABNORMAL HIGH (ref 0.00–0.50)

## 2020-04-26 LAB — C-REACTIVE PROTEIN: CRP: 3.4 mg/dL — ABNORMAL HIGH (ref <1.0)

## 2020-04-26 MED ORDER — POTASSIUM CHLORIDE CRYS ER 20 MEQ PO TBCR
20.0000 meq | EXTENDED_RELEASE_TABLET | Freq: Once | ORAL | Status: AC
  Administered 2020-04-26: 20 meq via ORAL
  Filled 2020-04-26: qty 1

## 2020-04-26 MED ORDER — MAGNESIUM SULFATE 2 GM/50ML IV SOLN
2.0000 g | Freq: Once | INTRAVENOUS | Status: AC
  Administered 2020-04-26: 2 g via INTRAVENOUS
  Filled 2020-04-26: qty 50

## 2020-04-26 NOTE — Progress Notes (Signed)
PROGRESS NOTE                                                                                                                                                                                                             Patient Demographics:    Beverly Howell, is a 79 y.o. female, DOB - [birth year], MWU:[id number]  Outpatient Primary MD for the patient is System, Provider Not In   Admit date - 04/21/2020   LOS - 5  Chief Complaint  Patient presents with  . Chest Pain       Brief Narrative: Patient is a 79 y.o. female with PMHx of HTN-who presented with left-sided weakness and shortness of breath-was found to have acute CVA along with severe hypoxic respiratory failure due to COVID-19 pneumonitis.  See below for further details.  COVID-19 vaccinated status: Unvaccinated  Significant Events: 11/8>> presented to [facility] with left-sided weakness due to CVA and severe hypoxia due to COVID-19 pneumonia  Significant studies: 11/8>> chest x-ray: Hazy bilateral airspace opacities 11/8>> CT head: Small infarct in the posterior right insula. 11/9>> MRI brain: Acute ischemia in the right MCA territory 11/9>> MRI brain: No large vessel occlusion 11/9>> carotid Doppler: No significant stenosis 11/9>> lower extremity Doppler: Bilateral lower extremity DVT and multiple veins. 11/9>> A1c: 5.5 11/9>> LDL: 93 11/10>> Echo: EF 60-65%, grade 1 diastolic dysfunction, RV systolic function is normal.  [id number] medications: Steroids: 11/8>> Remdesivir: 11/8>>11/13 Actemra: 11/8 x 1  Antibiotics: None  Microbiology data: 11/8 >>blood culture: No growth  Procedures: None  Consults: None  DVT prophylaxis: apixaban (ELIQUIS) tablet 10 mg  apixaban (ELIQUIS) tablet 5 mg     Subjective:   No major issues-stable on just 2 L of oxygen.   Assessment  & Plan :   Acute Hypoxic Resp Failure due to Covid 19 Viral pneumonia: Had severe  hypoxemia when she first presented to the hospital-remarkable improvement overnight-Down to 2 L of oxygen this morning.  She briefly tolerated being on room air yesterday.  Remdesivir will be completed on 11/13-remains on steroids.  No signs of volume overload-hold diuretics today.  Fever: afebrile O2 requirements:  SpO2: 96 % O2 Flow Rate (L/min): 5 L/min   COVID-19 Labs: Recent Labs    04/24/20 0154 04/25/20 0052 04/26/20 0153  DDIMER >20.00* >20.00* 17.76*  CRP 13.2* 7.2* 3.4*  Component Value Date/Time   BNP 103.4 (H) 04/25/2020 0052    Recent Labs  Lab 04/23/20 1308 04/24/20 0154 04/25/20 0052  PROCALCITON 2.54 1.58 1.09    Lab Results  Component Value Date   SARSCOV2NAA POSITIVE (A) 04/21/2020    Prone/Incentive Spirometry: encouraged patient to lie prone for 3-4 hours at a time for a total of 16 hours a day, and to encourage incentive spirometry use 3-4/hour.  Bilateral lower extremity DVT: Likely provoked by COVID-19-initially on IV heparin-but has been switched to Eliquis.  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  Left-sided weakness has improved-SLP evaluation completed on 11/10-has been started on a diet.  Continue PT/OT-recommendations are for CIR-but may require SNF as well.  Appreciate stroke team MD evaluation-Per my conversation with Dr. Pearlean Brownie on 11/11-okay to remain on Eliquis-aspirin not needed.  Continue high intensity statin.  HTN: BP relatively better-continue Lopressor-and amlodipine-reassess tomorrow for further adjustments.    AKI on CKD stage IIIa: AKI likely hemodynamically mediated-improved with supportive care.  Avoid nephrotoxic agents.   Obesity: Estimated body mass index is 30.52 kg/m as calculated from the following:   Height as of this encounter:  (1.651 m).   Weight as of this encounter: 83.2 kg.   GI prophylaxis:  PPI  ABG:    Component Value Date/Time   TCO2 27 04/21/2020 2117    Vent Settings: N/A  Condition - Guarded  Family Communication  :  Daughter [name] [phone]) updated over the phone 11/13  Code Status :  Full Code  Diet :  Diet Order            Diet regular Room service appropriate? Yes; Fluid consistency: Thin  Diet effective now                  Disposition Plan  :   Status is: Inpatient  Remains inpatient appropriate because:Inpatient level of care appropriate due to severity of illness   Dispo: The patient is from: Home              Anticipated d/c is to: TBD              Anticipated d/c date is: > 3 days              Patient currently is not medically stable to d/c.   Barriers to discharge: Hypoxia requiring O2 supplementation/complete 5 days of IV Remdesivir  Antimicorbials  :    Anti-infectives (From admission, onward)   Start     Dose/Rate Route Frequency Ordered Stop   04/23/20 1000  remdesivir 100 mg in sodium chloride 0.9 % 100 mL IVPB       "Followed by" Linked Group Details   100 mg 200 mL/hr over 30 Minutes Intravenous Daily 04/22/20 0233 04/26/20 1203   04/22/20 0400  remdesivir 200 mg in sodium chloride 0.9% 250 mL IVPB       "Followed by" Linked Group Details   200 mg 580 mL/hr over 30 Minutes Intravenous Once 04/22/20 0233 04/22/20 0537      Inpatient Medications  Scheduled Meds: . amLODipine  5 mg Oral Daily  . apixaban  10 mg Oral BID   Followed by  . [START ON 04/30/2020] apixaban  5 mg Oral BID  . atorvastatin  40 mg Oral Daily  . metoprolol tartrate  25 mg Oral BID  . pantoprazole  40 mg Oral  Daily  . predniSONE  50 mg Oral Daily   Continuous Infusions:  PRN Meds:.acetaminophen **OR** acetaminophen (TYLENOL) oral liquid 160 mg/5 mL **OR** acetaminophen, hydrALAZINE   Time Spent in minutes 25   See all Orders from today for further details   Jeoffrey Massed M.D on 04/26/2020 at 2:09 PM  To page go to  [email or web address] - use universal password  [facility] -  Office  [phone]    Objective:   Vitals:   04/26/20 0131 04/26/20 0506 04/26/20 0729 04/26/20 1158  BP: (!) 166/99 (!) 150/95 (!) 162/97 (!) 149/99  Pulse: 79 78 87 87  Resp: (!) 21 (!) 21 20 20   Temp:  98.1 F (36.7 C) 97.6 F (36.4 C) 97.8 F (36.6 C)  TempSrc:  Axillary Oral Oral  SpO2: 94% 97% 94% 96%  Weight:      Height:        Wt Readings from Last 3 Encounters:  04/22/20 83.2 kg  03/26/17 86.2 kg  02/03/14 87.5 kg     Intake/Output Summary (Last 24 hours) at 04/26/2020 1409 Last data filed at 04/26/2020 0952 Gross per 24 hour  Intake 120 ml  Output 1200 ml  Net -1080 ml     Physical Exam Gen Exam:Alert awake-not in any distress HEENT:atraumatic, normocephalic Chest: B/L clear to auscultation anteriorly CVS:S1S2 regular Abdomen:soft non tender, non distended Extremities:no edema Neurology: mild left sided weakness Skin: no rash   Data Review:    CBC Recent Labs  Lab 04/21/20 1950 04/21/20 2117 04/22/20 0235 04/23/20 0136 04/24/20 0154 04/25/20 0052 04/26/20 0153  WBC 12.8*   < > 12.4* 16.2* 19.4* 20.7* 15.4*  HGB 12.5   < > 11.3* 11.8* 12.0 12.1 12.1  HCT 39.0   < > 35.4* 37.0 37.0 36.8 36.9  PLT 213   < > 167 175 256 256 243  MCV 93.3   < > 94.4 93.9 94.1 90.9 91.6  MCH 29.9   < > 30.1 29.9 30.5 29.9 30.0  MCHC 32.1   < > 31.9 31.9 32.4 32.9 32.8  RDW 13.7   < > 13.6 13.7 13.8 13.7 13.7  LYMPHSABS 1.7  --   --  1.3 1.3 1.6 1.5  MONOABS 1.1*  --   --  0.5 0.5 0.9 0.8  EOSABS 0.0  --   --  0.0 0.0 0.0 0.0  BASOSABS 0.0  --   --  0.0 0.0 0.0 0.0   < > = values in this interval not displayed.    Chemistries  Recent Labs  Lab 04/21/20 1950 04/21/20 1950 04/21/20 2117 04/21/20 2117 04/22/20 0235 04/23/20 0136 04/24/20 0154 04/25/20 0052 04/26/20 0153  NA 135   < > 139  --   --  143 143 142 139  K 4.8   < > 5.6*  --   --  4.4 4.0 3.6 3.8  CL 100   < > 102  --    --  107 108 105 104  CO2 24  --   --   --   --  21* 21* 24 25  GLUCOSE 124*   < > 110*  --   --  121* 138* 144* 150*  BUN 28*   < > 38*  --   --  27* 27* 29* 32*  CREATININE 1.84*   < > 1.70*   < > 1.78* 1.42* 1.13* 1.25* 1.31*  CALCIUM 8.8*  --   --   --   --  8.2*  8.5* 8.8* 8.4*  AST 31  --   --   --   --  35 31 29 27   ALT 16  --   --   --   --  20 20 23 23   ALKPHOS 58  --   --   --   --  63 73 75 68  BILITOT 1.3*  --   --   --   --  1.0 1.5* 1.4* 1.1   < > = values in this interval not displayed.   ------------------------------------------------------------------------------------------------------------------ No results for input(s): CHOL, HDL, LDLCALC, TRIG, CHOLHDL, LDLDIRECT in the last 72 hours.  Lab Results  Component Value Date   HGBA1C 5.5 04/22/2020   ------------------------------------------------------------------------------------------------------------------ No results for input(s): TSH, T4TOTAL, T3FREE, THYROIDAB in the last 72 hours.  Invalid input(s): FREET3 ------------------------------------------------------------------------------------------------------------------ No results for input(s): VITAMINB12, FOLATE, FERRITIN, TIBC, IRON, RETICCTPCT in the last 72 hours.  Coagulation profile Recent Labs  Lab 04/21/20 1950  INR 1.5*    Recent Labs    04/25/20 0052 04/26/20 0153  DDIMER >20.00* 17.76*    Cardiac Enzymes No results for input(s): CKMB, TROPONINI, MYOGLOBIN in the last 168 hours.  Invalid input(s): CK ------------------------------------------------------------------------------------------------------------------    Component Value Date/Time   BNP 103.4 (H) 04/25/2020 0052    Micro Results Recent Results (from the past 240 hour(s))  Respiratory Panel by RT PCR (Flu A&B, Covid) - Nasopharyngeal Swab     Status: Abnormal   Collection Time: 04/21/20  6:41 PM   Specimen: Nasopharyngeal Swab  Result Value Ref Range Status   SARS  Coronavirus 2 by RT PCR POSITIVE (A) NEGATIVE Final    Comment: RESULT CALLED TO, READ BACK BY AND VERIFIED WITH: J FERRAIOLO 04/22/20 AT 0016 SK (NOTE) SARS-CoV-2 target nucleic acids are DETECTED.  SARS-CoV-2 RNA is generally detectable in upper respiratory specimens  during the acute phase of infection. Positive results are indicative of the presence of the identified virus, but do not rule out bacterial infection or co-infection with other pathogens not detected by the test. Clinical correlation with patient history and other diagnostic information is necessary to determine patient infection status. The expected result is Negative.  Fact Sheet for Patients:  https://www.moore.com/  Fact Sheet for Healthcare Providers: https://www.young.biz/  This test is not yet approved or cleared by the [place] FDA and  has been authorized for detection and/or diagnosis of SARS-CoV-2 by FDA under an Emergency Use Authorization (EUA).  This EUA will remain in effect (meaning this test can be use d) for the duration of  the COVID-19 declaration under Section 564(b)(1) of the Act, 21 U.S.C. section 360bbb-3(b)(1), unless the authorization is terminated or revoked sooner.      Influenza A by PCR NEGATIVE NEGATIVE Final   Influenza B by PCR NEGATIVE NEGATIVE Final    Comment: (NOTE) The Xpert Xpress SARS-CoV-2/FLU/RSV assay is intended as an aid in  the diagnosis of influenza from Nasopharyngeal swab specimens and  should not be used as a sole basis for treatment. Nasal washings and  aspirates are unacceptable for Xpert Xpress SARS-CoV-2/FLU/RSV  testing.  Fact Sheet for Patients: https://www.moore.com/  Fact Sheet for Healthcare Providers: https://www.young.biz/  This test is not yet approved or cleared by the [place] FDA and  has been authorized for detection and/or diagnosis of SARS-CoV-2 by  FDA  under an Emergency Use Authorization (EUA). This EUA will remain  in effect (meaning this test can be used) for the duration of the  Covid-19 declaration  under Section 564(b)(1) of the Act, 21  U.S.C. section 360bbb-3(b)(1), unless the authorization is  terminated or revoked. Performed at [facility], [address], [place], [place] [address]   Blood culture (routine x 2)     Status: None   Collection Time: 04/21/20  7:50 PM   Specimen: BLOOD  Result Value Ref Range Status   Specimen Description BLOOD SITE NOT SPECIFIED  Final   Special Requests   Final    BOTTLES DRAWN AEROBIC AND ANAEROBIC Blood Culture adequate volume   Culture   Final    NO GROWTH 5 DAYS Performed at [facility], [address], [place], [place] [address]    Report Status 04/26/2020 FINAL  Final  Blood culture (routine x 2)     Status: None   Collection Time: 04/21/20  9:10 PM   Specimen: BLOOD  Result Value Ref Range Status   Specimen Description BLOOD SITE NOT SPECIFIED  Final   Special Requests   Final    BOTTLES DRAWN AEROBIC AND ANAEROBIC Blood Culture results may not be optimal due to an inadequate volume of blood received in culture bottles   Culture   Final    NO GROWTH 5 DAYS Performed at [facility], [address] [place], [place] [address]    Report Status 04/26/2020 FINAL  Final    Radiology Reports DG Abd 1 View  Result Date: 04/22/2020 CLINICAL DATA:  MRI clearance.  Altered mental status. EXAM: ABDOMEN - 1 VIEW COMPARISON:  None. FINDINGS: The bowel gas pattern is normal. No radio-opaque calculi or other significant radiographic abnormality are seen. There appear to be hazy airspace opacities involving the lungs bilaterally. No metallic foreign body identified. There are degenerative changes of both hips, right worse than left. There are degenerative changes of the lumbar spine. IMPRESSION: 1. No metallic foreign body identified. 2. Hazy airspace opacities involving the  lungs bilaterally. 3. Nonobstructive bowel gas pattern. Electronically Signed   By: Katherine Mantle M.D.   On: 04/22/2020 00:23   CT Head Wo Contrast  Result Date: 04/21/2020 CLINICAL DATA:  79 year old female with intermittent dysarthria and left side weakness x1 week, progressive since 1400 hours on 04/20/2020. Hypoxia on initial presentation. EXAM: CT HEAD WITHOUT CONTRAST TECHNIQUE: Contiguous axial images were obtained from the base of the skull through the vertex without intravenous contrast. COMPARISON:  None. FINDINGS: Brain: Small chronic infarct in the left cerebellum (series 3, image 8). No acute intracranial hemorrhage identified. No midline shift, mass effect, or evidence of intracranial mass lesion. No ventriculomegaly. Mild for age scattered bilateral white matter hypodensity. Small age indeterminate hypodense area in the posterior right insula on series 3, image 15. No other no acute or subacute cortically based infarct identified. No supratentorial cortical encephalomalacia identified. Deep gray matter nuclei and brainstem appear within normal limits. Vascular: Calcified atherosclerosis at the skull base. No suspicious intracranial vascular hyperdensity. Skull: No acute osseous abnormality identified. Sinuses/Orbits: Small fluid level in the left maxillary sinus. Bubbly opacity there as well as in the right maxillary and sphenoid sinuses. Bubbly opacity in the posterior left ethmoid. Tympanic cavities and mastoids appear clear. Other: No acute orbit or scalp soft tissue finding identified. IMPRESSION: 1. Small infarct at the posterior right insula, age indeterminate but suspicious for subacute ischemia in this setting. No associated hemorrhage or mass effect. 2. Elsewhere mild for age cerebral white matter changes, and a small chronic left cerebellar infarct. 3. Acute paranasal sinus inflammation.  Electronically Signed   By: Odessa Fleming M.D.   On: 04/21/2020 22:56   MR ANGIO HEAD WO  CONTRAST  Result Date: 04/22/2020 CLINICAL DATA:  Follow-up examination for acute stroke. EXAM: MRI HEAD WITHOUT CONTRAST MRA HEAD WITHOUT CONTRAST TECHNIQUE: Multiplanar, multiecho pulse sequences of the brain and surrounding structures were obtained without intravenous contrast. Angiographic images of the head were obtained using MRA technique without contrast. COMPARISON:  Prior CT from 04/21/2020. FINDINGS: MRI HEAD FINDINGS Brain: Examination degraded by motion artifact. Generalized age-related cerebral atrophy. Mild chronic microvascular ischemic disease present within the periventricular and deep white matter both cerebral hemispheres. Small remote left cerebellar infarct noted. Patchy restricted diffusion seen involving the posterior right insula as well as the overlying right posterior frontal and parietal lobes, consistent with right MCA territory infarcts. No significant mass effect or associated hemorrhage. An embolic etiology is suspected given distribution. No other diffusion abnormality to suggest acute or subacute ischemia. Gray-white matter differentiation otherwise maintained. No other areas of encephalomalacia to suggest chronic cortical infarction on this motion degraded exam. No other definite foci of susceptibility artifact to suggest acute or chronic intracranial hemorrhage. No mass lesion, midline shift or mass effect. No hydrocephalus or extra-axial fluid collection. No made of a partially empty sella. Midline structures intact. Vascular: Major intracranial vascular flow voids are maintained. Skull and upper cervical spine: Craniocervical junction within normal limits. Multilevel cervical spondylosis noted within the upper cervical spine without high-grade stenosis. Bone marrow signal intensity normal. No scalp soft tissue abnormality. Sinuses/Orbits: Globes and orbital soft tissues within normal limits. Scattered mucosal thickening noted throughout the ethmoidal air cells and maxillary  sinuses. Superimposed air-fluid level noted within the left maxillary sinus. Trace bilateral mastoid effusions. Visualized nasopharynx within normal limits. Inner ear structures grossly normal. Other: None. MRA HEAD FINDINGS ANTERIOR CIRCULATION: Examination somewhat degraded by motion artifact. Visualized distal cervical segments of the internal carotid arteries are widely patent with antegrade flow. Petrous, cavernous, and supraclinoid ICAs demonstrate probable atheromatous irregularity without appreciable hemodynamically significant stenosis. A1 segments patent bilaterally. Normal anterior communicating artery complex. Anterior cerebral arteries patent to their distal aspects without stenosis. No M1 stenosis or occlusion. Normal MCA bifurcations. Distal MCA branches well perfused and fairly symmetric. POSTERIOR CIRCULATION: Vertebral arteries patent to the vertebrobasilar junction without stenosis. Both PICAs patent proximally. Basilar patent to its distal aspect without stenosis. Superior cerebral arteries patent bilaterally. Right PCA supplied via a hypoplastic right P1 segment and prominent right posterior communicating artery. Fetal type origin left PCA. Both PCAs perfused to their distal aspects without proximal flow-limiting stenosis. No aneurysm. IMPRESSION: MRI HEAD IMPRESSION: 1. 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 An embolic etiology is suspected. No significant mass effect. 2. Underlying age-related cerebral atrophy with mild chronic small vessel ischemic disease. Small remote left cerebellar infarct. MRA HEAD IMPRESSION: 1. Technically limited exam due to motion artifact. 2. Negative intracranial MRA for large vessel occlusion. No hemodynamically significant or correctable stenosis. 3. Mild atheromatous irregularity about the carotid siphons without significant stenosis.  Electronically Signed   By: Rise Mu M.D.   On: 04/22/2020 01:29   MR BRAIN WO CONTRAST  Result Date: 04/22/2020 CLINICAL DATA:  Follow-up examination for acute stroke. EXAM: MRI HEAD WITHOUT CONTRAST MRA HEAD WITHOUT CONTRAST TECHNIQUE: Multiplanar, multiecho pulse sequences of the brain and surrounding structures were obtained without intravenous contrast. Angiographic images of the head were obtained using MRA technique without  contrast. COMPARISON:  Prior CT from 04/21/2020. FINDINGS: MRI HEAD FINDINGS Brain: Examination degraded by motion artifact. Generalized age-related cerebral atrophy. Mild chronic microvascular ischemic disease present within the periventricular and deep white matter both cerebral hemispheres. Small remote left cerebellar infarct noted. Patchy restricted diffusion seen involving the posterior right insula as well as the overlying right posterior frontal and parietal lobes, consistent with right MCA territory infarcts. No significant mass effect or associated hemorrhage. An embolic etiology is suspected given distribution. No other diffusion abnormality to suggest acute or subacute ischemia. Gray-white matter differentiation otherwise maintained. No other areas of encephalomalacia to suggest chronic cortical infarction on this motion degraded exam. No other definite foci of susceptibility artifact to suggest acute or chronic intracranial hemorrhage. No mass lesion, midline shift or mass effect. No hydrocephalus or extra-axial fluid collection. No made of a partially empty sella. Midline structures intact. Vascular: Major intracranial vascular flow voids are maintained. Skull and upper cervical spine: Craniocervical junction within normal limits. Multilevel cervical spondylosis noted within the upper cervical spine without high-grade stenosis. Bone marrow signal intensity normal. No scalp soft tissue abnormality. Sinuses/Orbits: Globes and orbital soft tissues within normal  limits. Scattered mucosal thickening noted throughout the ethmoidal air cells and maxillary sinuses. Superimposed air-fluid level noted within the left maxillary sinus. Trace bilateral mastoid effusions. Visualized nasopharynx within normal limits. Inner ear structures grossly normal. Other: None. MRA HEAD FINDINGS ANTERIOR CIRCULATION: Examination somewhat degraded by motion artifact. Visualized distal cervical segments of the internal carotid arteries are widely patent with antegrade flow. Petrous, cavernous, and supraclinoid ICAs demonstrate probable atheromatous irregularity without appreciable hemodynamically significant stenosis. A1 segments patent bilaterally. Normal anterior communicating artery complex. Anterior cerebral arteries patent to their distal aspects without stenosis. No M1 stenosis or occlusion. Normal MCA bifurcations. Distal MCA branches well perfused and fairly symmetric. POSTERIOR CIRCULATION: Vertebral arteries patent to the vertebrobasilar junction without stenosis. Both PICAs patent proximally. Basilar patent to its distal aspect without stenosis. Superior cerebral arteries patent bilaterally. Right PCA supplied via a hypoplastic right P1 segment and prominent right posterior communicating artery. Fetal type origin left PCA. Both PCAs perfused to their distal aspects without proximal flow-limiting stenosis. No aneurysm. IMPRESSION: MRI HEAD IMPRESSION: 1. 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 An embolic etiology is suspected. No significant mass effect. 2. Underlying age-related cerebral atrophy with mild chronic small vessel ischemic disease. Small remote left cerebellar infarct. MRA HEAD IMPRESSION: 1. Technically limited exam due to motion artifact. 2. Negative intracranial MRA for large vessel occlusion. No hemodynamically significant or correctable stenosis. 3. Mild  atheromatous irregularity about the carotid siphons without significant stenosis. Electronically Signed   By: Rise Mu M.D.   On: 04/22/2020 01:29   DG Chest Portable 1 View  Result Date: 04/21/2020 CLINICAL DATA:  Hypoxia EXAM: PORTABLE CHEST 1 VIEW COMPARISON:  None. FINDINGS: The heart size is enlarged. There are hazy bilateral airspace opacities. There is no pneumothorax. No large pleural effusion. There are end-stage degenerative changes of both glenohumeral joints. IMPRESSION: Cardiomegaly with hazy bilateral airspace opacities concerning for pulmonary edema or an atypical infectious process. Electronically Signed   By: Katherine Mantle M.D.   On: 04/21/2020 20:21   ECHOCARDIOGRAM COMPLETE  Result Date: 04/23/2020    ECHOCARDIOGRAM REPORT   Patient Name:   MATHILDA MAGUIRE Date of Exam: 04/23/2020 Medical Rec #:  [id number]     Height:       65.0  in Accession #:    [id number]    Weight:       183.4 lb Date of Birth:  11-Aug-1940      BSA:          1.907 m Patient Age:    79 years      BP:           151/94 mmHg Patient Gender: F             HR:           79 bpm. Exam Location:  Inpatient Procedure: 2D Echo, Cardiac Doppler and Color Doppler Indications:    Stroke 434.91 / I163.9  History:        Patient has no prior history of Echocardiogram examinations.                 Risk Factors:Hypertension and Dyslipidemia. COVID-19 Positive.  Sonographer:    Elmarie Shiley Dance Referring Phys: [id number] ARSHAD N KAKRAKANDY IMPRESSIONS  1. Left ventricular ejection fraction, by estimation, is 60 to 65%. The left ventricle has normal function. The left ventricle has no regional wall motion abnormalities. Left ventricular diastolic parameters are consistent with Grade I diastolic dysfunction (impaired relaxation).  2. Right ventricular systolic function is normal. The right ventricular size is normal. There is normal pulmonary artery systolic pressure. The estimated right ventricular systolic pressure is 27.4 mmHg.   3. The mitral valve is normal in structure. Trivial mitral valve regurgitation. No evidence of mitral stenosis.  4. The aortic valve is normal in structure. There is moderate calcification of the aortic valve. There is moderate thickening of the aortic valve. Aortic valve regurgitation is trivial. Mild to moderate aortic valve sclerosis/calcification is present, without any evidence of aortic stenosis. Aortic valve mean gradient measures 8.5 mmHg. Aortic valve Vmax measures 2.13 m/s.  5. The inferior vena cava is normal in size with greater than 50% respiratory variability, suggesting right atrial pressure of 3 mmHg. Conclusion(s)/Recommendation(s): No intracardiac source of embolism detected on this transthoracic study. A transesophageal echocardiogram is recommended to exclude cardiac source of embolism if clinically indicated. FINDINGS  Left Ventricle: Left ventricular ejection fraction, by estimation, is 60 to 65%. The left ventricle has normal function. The left ventricle has no regional wall motion abnormalities. The left ventricular internal cavity size was normal in size. There is  no left ventricular hypertrophy. Left ventricular diastolic parameters are consistent with Grade I diastolic dysfunction (impaired relaxation). Right Ventricle: The right ventricular size is normal. No increase in right ventricular wall thickness. Right ventricular systolic function is normal. There is normal pulmonary artery systolic pressure. The tricuspid regurgitant velocity is 2.47 m/s, and  with an assumed right atrial pressure of 3 mmHg, the estimated right ventricular systolic pressure is 27.4 mmHg. Left Atrium: Left atrial size was normal in size. Right Atrium: Right atrial size was normal in size. Pericardium: There is no evidence of pericardial effusion. Mitral Valve: The mitral valve is normal in structure. Trivial mitral valve regurgitation. No evidence of mitral valve stenosis. Tricuspid Valve: The tricuspid valve is  normal in structure. Tricuspid valve regurgitation is mild . No evidence of tricuspid stenosis. Aortic Valve: The aortic valve is normal in structure. There is moderate calcification of the aortic valve. There is moderate thickening of the aortic valve. Aortic valve regurgitation is trivial. Mild to moderate aortic valve sclerosis/calcification is present, without any evidence of aortic stenosis. Aortic valve mean gradient measures 8.5 mmHg. Aortic valve peak gradient measures 18.2 mmHg. Aortic  valve area, by VTI measures 1.58 cm. Pulmonic Valve: The pulmonic valve was normal in structure. Pulmonic valve regurgitation is not visualized. No evidence of pulmonic stenosis. Aorta: The aortic root is normal in size and structure. Venous: The inferior vena cava is normal in size with greater than 50% respiratory variability, suggesting right atrial pressure of 3 mmHg. IAS/Shunts: No atrial level shunt detected by color flow Doppler.  LEFT VENTRICLE PLAX 2D LVIDd:         3.90 cm  Diastology LVIDs:         2.72 cm  LV e' medial:    4.35 cm/s LV PW:         0.92 cm  LV E/e' medial:  14.5 LV IVS:        1.15 cm  LV e' lateral:   6.42 cm/s LVOT diam:     2.00 cm  LV E/e' lateral: 9.8 LV SV:         63 LV SV Index:   33 LVOT Area:     3.14 cm  RIGHT VENTRICLE             IVC RV Basal diam:  2.65 cm     IVC diam: 1.81 cm RV S prime:     12.00 cm/s TAPSE (M-mode): 2.0 cm LEFT ATRIUM             Index       RIGHT ATRIUM          Index LA diam:        3.20 cm 1.68 cm/m  RA Area:     9.10 cm LA Vol (A2C):   56.4 ml 29.58 ml/m RA Volume:   14.70 ml 7.71 ml/m LA Vol (A4C):   59.4 ml 31.15 ml/m LA Biplane Vol: 60.1 ml 31.52 ml/m  AORTIC VALVE AV Area (Vmax):    1.49 cm AV Area (Vmean):   1.39 cm AV Area (VTI):     1.58 cm AV Vmax:           213.50 cm/s AV Vmean:          135.000 cm/s AV VTI:            0.395 m AV Peak Grad:      18.2 mmHg AV Mean Grad:      8.5 mmHg LVOT Vmax:         101.40 cm/s LVOT Vmean:        59.550  cm/s LVOT VTI:          0.199 m LVOT/AV VTI ratio: 0.50  AORTA Ao Root diam: 2.80 cm Ao Asc diam:  3.40 cm MITRAL VALVE                TRICUSPID VALVE MV Area (PHT): 2.45 cm     TR Peak grad:   24.4 mmHg MV Decel Time: 310 msec     TR Vmax:        247.00 cm/s MV E velocity: 62.90 cm/s MV A velocity: 103.00 cm/s  SHUNTS MV E/A ratio:  0.61         Systemic VTI:  0.20 m                             Systemic Diam: 2.00 cm Donato Schultz MD Electronically signed by Donato Schultz MD Signature Date/Time: 04/23/2020/2:48:24 PM    Final    VAS US CAROTID (at [facility] and [facility]  only)  Result Date: 04/22/2020 Carotid Arterial Duplex Study Indications:       CVA and DVT positive. Risk Factors:      Hypertension. Other Factors:     COVID 19 positive. Comparison Study:  No prior studies. Performing Technologist: Chanda Busing RVT  Examination Guidelines: A complete evaluation includes B-mode imaging, spectral Doppler, color Doppler, and power Doppler as needed of all accessible portions of each vessel. Bilateral testing is considered an integral part of a complete examination. Limited examinations for reoccurring indications may be performed as noted.  Right Carotid Findings: +----------+--------+--------+--------+-----------------------+--------+           PSV cm/sEDV cm/sStenosisPlaque Description     Comments +----------+--------+--------+--------+-----------------------+--------+ CCA Prox  56      5               smooth and heterogenous         +----------+--------+--------+--------+-----------------------+--------+ CCA Distal49      11              smooth and heterogenous         +----------+--------+--------+--------+-----------------------+--------+ ICA Prox  48      14              smooth and heterogenous         +----------+--------+--------+--------+-----------------------+--------+ ICA Distal87      24                                     tortuous  +----------+--------+--------+--------+-----------------------+--------+ ECA       59      6                                               +----------+--------+--------+--------+-----------------------+--------+ +----------+--------+-------+--------+-------------------+           PSV cm/sEDV cmsDescribeArm Pressure (mmHG) +----------+--------+-------+--------+-------------------+ [id number]                                         +----------+--------+-------+--------+-------------------+ +---------+--------+--+--------+--+---------+ VertebralPSV cm/s59EDV cm/s13Antegrade +---------+--------+--+--------+--+---------+  Left Carotid Findings: +----------+--------+--------+--------+-----------------------+--------+           PSV cm/sEDV cm/sStenosisPlaque Description     Comments +----------+--------+--------+--------+-----------------------+--------+ CCA Prox  97      19              smooth and heterogenous         +----------+--------+--------+--------+-----------------------+--------+ CCA Distal49      11              smooth and heterogenous         +----------+--------+--------+--------+-----------------------+--------+ ICA Prox  37      11              smooth and heterogenous         +----------+--------+--------+--------+-----------------------+--------+ ICA Distal59      22                                     tortuous +----------+--------+--------+--------+-----------------------+--------+ ECA       44      5                                               +----------+--------+--------+--------+-----------------------+--------+ +----------+--------+--------+--------+-------------------+  PSV cm/sEDV cm/sDescribeArm Pressure (mmHG) +----------+--------+--------+--------+-------------------+ [id number]                                          +----------+--------+--------+--------+-------------------+  +---------+--------+--+--------+--+---------+ VertebralPSV cm/s33EDV cm/s10Antegrade +---------+--------+--+--------+--+---------+   Summary: Right Carotid: Velocities in the right ICA are consistent with a 1-39% stenosis. Left Carotid: Velocities in the left ICA are consistent with a 1-39% stenosis. Vertebrals: Bilateral vertebral arteries demonstrate antegrade flow. *See table(s) above for measurements and observations.  Electronically signed by Delia Heady MD on 04/22/2020 at 1:17:39 PM.    Final    VAS [place] LOWER EXTREMITY VENOUS (DVT)  Result Date: 04/22/2020  Lower Venous DVT Study Indications: Elevated Ddimer.  Risk Factors: COVID 19 positive. Comparison Study: No prior studies. Performing Technologist: Chanda Busing RVT  Examination Guidelines: A complete evaluation includes B-mode imaging, spectral Doppler, color Doppler, and power Doppler as needed of all accessible portions of each vessel. Bilateral testing is considered an integral part of a complete examination. Limited examinations for reoccurring indications may be performed as noted. The reflux portion of the exam is performed with the patient in reverse Trendelenburg.  +---------+---------------+---------+-----------+----------+-----------------+ RIGHT    CompressibilityPhasicitySpontaneityPropertiesThrombus Aging    +---------+---------------+---------+-----------+----------+-----------------+ CFV      Full           Yes      Yes                                    +---------+---------------+---------+-----------+----------+-----------------+ SFJ      Full                                                           +---------+---------------+---------+-----------+----------+-----------------+ FV Prox  Full                                                           +---------+---------------+---------+-----------+----------+-----------------+ FV Mid   Partial        No       No                   Age  Indeterminate +---------+---------------+---------+-----------+----------+-----------------+ FV DistalNone           No       No                   Age Indeterminate +---------+---------------+---------+-----------+----------+-----------------+ PFV      Full                                                           +---------+---------------+---------+-----------+----------+-----------------+ POP      None           No       No  Age Indeterminate +---------+---------------+---------+-----------+----------+-----------------+ PTV      None                                         Age Indeterminate +---------+---------------+---------+-----------+----------+-----------------+ PERO     None                                         Age Indeterminate +---------+---------------+---------+-----------+----------+-----------------+ Gastroc  None                                         Age Indeterminate +---------+---------------+---------+-----------+----------+-----------------+   +---------+---------------+---------+-----------+----------+--------------+ LEFT     CompressibilityPhasicitySpontaneityPropertiesThrombus Aging +---------+---------------+---------+-----------+----------+--------------+ CFV      Full           Yes      Yes                                 +---------+---------------+---------+-----------+----------+--------------+ SFJ      Full                                                        +---------+---------------+---------+-----------+----------+--------------+ FV Prox  Full                                                        +---------+---------------+---------+-----------+----------+--------------+ FV Mid   Full                                                        +---------+---------------+---------+-----------+----------+--------------+ FV DistalFull                                                         +---------+---------------+---------+-----------+----------+--------------+ PFV      Full                                                        +---------+---------------+---------+-----------+----------+--------------+ POP      None           No       No                   Acute          +---------+---------------+---------+-----------+----------+--------------+ PTV      None  Acute          +---------+---------------+---------+-----------+----------+--------------+ PERO     None                                         Acute          +---------+---------------+---------+-----------+----------+--------------+    Summary: RIGHT: - Findings consistent with age indeterminate deep vein thrombosis involving the right femoral vein, right popliteal vein, right posterior tibial veins, right peroneal veins, and right gastrocnemius veins. - No cystic structure found in the popliteal fossa.  LEFT: - Findings consistent with acute deep vein thrombosis involving the left popliteal vein, left posterior tibial veins, and left peroneal veins. - No cystic structure found in the popliteal fossa.  *See table(s) above for measurements and observations. Electronically signed by Gretta Began MD on 04/22/2020 at 2:50:20 PM.    Final

## 2020-04-26 NOTE — Progress Notes (Signed)
Tele called about 6 beats of VTAC.  Was in patient room and adjusting her leads so I don't think it was true [facility]  Patient said she did not feel any different.

## 2020-04-26 NOTE — Plan of Care (Signed)
  Problem: Education: Goal: Knowledge of disease or condition will improve Outcome: Progressing Goal: Knowledge of secondary prevention will improve Outcome: Progressing Goal: Knowledge of patient specific risk factors addressed and post discharge goals established will improve Outcome: Progressing Goal: Individualized Educational Video(s) Outcome: Progressing   Problem: Coping: Goal: Will verbalize positive feelings about self Outcome: Progressing Goal: Will identify appropriate support needs Outcome: Progressing   Problem: Health Behavior/Discharge Planning: Goal: Ability to manage health-related needs will improve Outcome: Progressing   Problem: Self-Care: Goal: Ability to participate in self-care as condition permits will improve Outcome: Progressing Goal: Verbalization of feelings and concerns over difficulty with self-care will improve Outcome: Progressing Goal: Ability to communicate needs accurately will improve Outcome: Progressing   Problem: Nutrition: Goal: Dietary intake will improve Outcome: Progressing   Problem: Education: Goal: Knowledge of risk factors and measures for prevention of condition will improve Outcome: Progressing   Problem: Coping: Goal: Psychosocial and spiritual needs will be supported Outcome: Progressing   Problem: Respiratory: Goal: Will maintain a patent airway Outcome: Progressing Goal: Complications related to the disease process, condition or treatment will be avoided or minimized Outcome: Progressing   Problem: Education: Goal: Knowledge of General Education information will improve Description: Including pain rating scale, medication(s)/side effects and non-pharmacologic comfort measures Outcome: Progressing   Problem: Health Behavior/Discharge Planning: Goal: Ability to manage health-related needs will improve Outcome: Progressing   Problem: Clinical Measurements: Goal: Ability to maintain clinical measurements within  normal limits will improve Outcome: Progressing Goal: Will remain free from infection Outcome: Progressing Goal: Diagnostic test results will improve Outcome: Progressing Goal: Respiratory complications will improve Outcome: Progressing Goal: Cardiovascular complication will be avoided Outcome: Progressing   Problem: Activity: Goal: Risk for activity intolerance will decrease Outcome: Progressing   Problem: Nutrition: Goal: Adequate nutrition will be maintained Outcome: Progressing   Problem: Coping: Goal: Level of anxiety will decrease Outcome: Progressing   Problem: Elimination: Goal: Will not experience complications related to bowel motility Outcome: Progressing Goal: Will not experience complications related to urinary retention Outcome: Progressing   Problem: Pain Managment: Goal: General experience of comfort will improve Outcome: Progressing   Problem: Safety: Goal: Ability to remain free from injury will improve Outcome: Progressing   Problem: Skin Integrity: Goal: Risk for impaired skin integrity will decrease Outcome: Progressing

## 2020-04-27 LAB — COMPREHENSIVE METABOLIC PANEL
ALT: 18 U/L (ref 0–44)
AST: 25 U/L (ref 15–41)
Albumin: 2.5 g/dL — ABNORMAL LOW (ref 3.5–5.0)
Alkaline Phosphatase: 77 U/L (ref 38–126)
Anion gap: 13 (ref 5–15)
BUN: 36 mg/dL — ABNORMAL HIGH (ref 8–23)
CO2: 21 mmol/L — ABNORMAL LOW (ref 22–32)
Calcium: 8.4 mg/dL — ABNORMAL LOW (ref 8.9–10.3)
Chloride: 104 mmol/L (ref 98–111)
Creatinine, Ser: 1.29 mg/dL — ABNORMAL HIGH (ref 0.44–1.00)
GFR, Estimated: 42 mL/min — ABNORMAL LOW (ref >60)
Glucose, Bld: 145 mg/dL — ABNORMAL HIGH (ref 70–99)
Potassium: 5.1 mmol/L (ref 3.5–5.1)
Sodium: 138 mmol/L (ref 135–145)
Total Bilirubin: 1.2 mg/dL (ref 0.3–1.2)
Total Protein: 5.4 g/dL — ABNORMAL LOW (ref 6.5–8.1)

## 2020-04-27 LAB — CBC WITH DIFFERENTIAL/PLATELET
Abs Immature Granulocytes: 0.2 10*3/uL — ABNORMAL HIGH (ref 0.00–0.07)
Basophils Absolute: 0 10*3/uL (ref 0.0–0.1)
Basophils Relative: 0 %
Eosinophils Absolute: 0 10*3/uL (ref 0.0–0.5)
Eosinophils Relative: 0 %
HCT: 38.9 % (ref 36.0–46.0)
Hemoglobin: 12.5 g/dL (ref 12.0–15.0)
Immature Granulocytes: 1 %
Lymphocytes Relative: 9 %
Lymphs Abs: 2.2 10*3/uL (ref 0.7–4.0)
MCH: 30 pg (ref 26.0–34.0)
MCHC: 32.1 g/dL (ref 30.0–36.0)
MCV: 93.5 fL (ref 80.0–100.0)
Monocytes Absolute: 1.3 10*3/uL — ABNORMAL HIGH (ref 0.1–1.0)
Monocytes Relative: 6 %
Neutro Abs: 19.9 10*3/uL — ABNORMAL HIGH (ref 1.7–7.7)
Neutrophils Relative %: 84 %
Platelets: 302 10*3/uL (ref 150–400)
RBC: 4.16 MIL/uL (ref 3.87–5.11)
RDW: 13.9 % (ref 11.5–15.5)
WBC: 23.6 10*3/uL — ABNORMAL HIGH (ref 4.0–10.5)
nRBC: 0.2 % (ref 0.0–0.2)

## 2020-04-27 LAB — GLUCOSE, CAPILLARY
Glucose-Capillary: 108 mg/dL — ABNORMAL HIGH (ref 70–99)
Glucose-Capillary: 120 mg/dL — ABNORMAL HIGH (ref 70–99)
Glucose-Capillary: 135 mg/dL — ABNORMAL HIGH (ref 70–99)
Glucose-Capillary: 177 mg/dL — ABNORMAL HIGH (ref 70–99)

## 2020-04-27 LAB — D-DIMER, QUANTITATIVE: D-Dimer, Quant: 15.78 ug/mL-FEU — ABNORMAL HIGH (ref 0.00–0.50)

## 2020-04-27 LAB — MAGNESIUM: Magnesium: 2.6 mg/dL — ABNORMAL HIGH (ref 1.7–2.4)

## 2020-04-27 LAB — C-REACTIVE PROTEIN: CRP: 1.7 mg/dL — ABNORMAL HIGH (ref <1.0)

## 2020-04-27 MED ORDER — PREDNISONE 20 MG PO TABS
40.0000 mg | ORAL_TABLET | Freq: Every day | ORAL | Status: DC
  Administered 2020-04-28: 40 mg via ORAL
  Filled 2020-04-27: qty 2

## 2020-04-27 NOTE — TOC Progression Note (Addendum)
Transition of Care [facility]) - Progression Note    Patient Details  Name: Beverly Howell MRN: [id number] Date of Birth: Apr 21, 1941  [facility] CM/SW Contact  Verna Czech [place], [place] Phone Number: [phone] 04/27/2020, 10:49 AM  Clinical Narrative:    Phone call to [facility], left a message for Kirsten regarding authorization.  11:11 Return call from [facility], spoke with Fritzi Mandes. Authorization is pending, should be back tomorrow.  Transition of Care to continue follow.  [address], LCSW Transition of Care [phone]    Expected Discharge Plan: Skilled Nursing Facility Barriers to Discharge: Continued Medical Work up  Expected Discharge Plan and Services Expected Discharge Plan: Skilled Nursing Facility In-house Referral: Clinical Social Work Discharge Planning Services: CM Consult   Living arrangements for the past 2 months: Apartment                                       Social Determinants of Health (SDOH) Interventions    Readmission Risk Interventions No flowsheet data found.

## 2020-04-27 NOTE — Progress Notes (Addendum)
PROGRESS NOTE                                                                                                                                                                                                             Patient Demographics:    Beverly Howell, is a 79 y.o. female, DOB - April 21, 1941, TOI:[id number]  Outpatient Primary MD for the patient is System, Provider Not In   Admit date - 04/21/2020   LOS - 6  Chief Complaint  Patient presents with  . Chest Pain       Brief Narrative: Patient is a 79 y.o. female with PMHx of HTN-who presented with left-sided weakness and shortness of breath-was found to have acute CVA along with severe hypoxic respiratory failure due to COVID-19 pneumonitis.  See below for further details.  COVID-19 vaccinated status: Unvaccinated  Significant Events: 11/8>> presented to [facility] with left-sided weakness due to CVA and severe hypoxia due to COVID-19 pneumonia  Significant studies: 11/8>> chest x-ray: Hazy bilateral airspace opacities 11/8>> CT head: Small infarct in the posterior right insula. 11/9>> MRI brain: Acute ischemia in the right MCA territory 11/9>> MRI brain: No large vessel occlusion 11/9>> carotid Doppler: No significant stenosis 11/9>> lower extremity Doppler: Bilateral lower extremity DVT and multiple veins. 11/9>> A1c: 5.5 11/9>> LDL: 93 11/10>> Echo: EF 60-65%, grade 1 diastolic dysfunction, RV systolic function is normal.  IPJAS-50 medications: Steroids: 11/8>> Remdesivir: 11/8>>11/13 Actemra: 11/8 x 1  Antibiotics: None  Microbiology data: 11/8 >>blood culture: No growth  Procedures: None  Consults: None  DVT prophylaxis: apixaban (ELIQUIS) tablet 10 mg  apixaban (ELIQUIS) tablet 5 mg     Subjective:   No major issues overnight-stable on just 2 L of oxygen.   Assessment  & Plan :   Acute Hypoxic Resp Failure due to Covid 19 Viral pneumonia: Had  severe hypoxemia when she first presented to the hospital-remarkable improvement -Down to 2 L of oxygen this morning.  She briefly tolerated being on room air yesterday.  Remdesivir will be completed on 11/13-remains on tapering steroids.  No signs of volume overload-hold diuretics today.  Fever: afebrile O2 requirements:  SpO2: 95 % O2 Flow Rate (L/min): 2 L/min   COVID-19 Labs: Recent Labs    04/25/20 0052 04/26/20 0153 04/27/20 0400  DDIMER >20.00* 17.76* 15.78*  CRP 7.2* 3.4* 1.7*  Component Value Date/Time   BNP 103.4 (H) 04/25/2020 0052    Recent Labs  Lab 04/23/20 1308 04/24/20 0154 04/25/20 0052  PROCALCITON 2.54 1.58 1.09    Lab Results  Component Value Date   SARSCOV2NAA POSITIVE (A) 04/21/2020    Prone/Incentive Spirometry: encouraged patient to lie prone for 3-4 hours at a time for a total of 16 hours a day, and to encourage incentive spirometry use 3-4/hour.  Bilateral lower extremity DVT: Likely provoked by COVID-19-initially on IV heparin-but has been switched to Eliquis.  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  Left-sided weakness has improved-SLP evaluation completed on 11/10-has been started on a diet.  Continue PT/OT-recommendations are for CIR-but may require SNF as well.  Appreciate stroke team MD evaluation-Per my conversation with Dr. Pearlean Brownie on 11/11-okay to remain on Eliquis-aspirin not needed.  Continue high intensity statin.  HTN: BP relatively better-continue Lopressor-and amlodipine-reassess tomorrow for further adjustments.    AKI on CKD stage IIIa: AKI likely hemodynamically mediated-improved with supportive care.  Avoid nephrotoxic agents.   Obesity: Estimated body mass index is 36.03 kg/m as calculated from the following:   Height as of this encounter: 5\' 5"  (1.651 m).   Weight as of this encounter: 98.2 kg.   GI prophylaxis:  PPI  ABG:    Component Value Date/Time   TCO2 27 04/21/2020 2117    Vent Settings: N/A  Condition - Guarded  Family Communication  :  Daughter 2118 [phone]) updated over the phone 11/13-we will update on 11/15.  Code Status :  Full Code  Diet :  Diet Order            Diet regular Room service appropriate? Yes; Fluid consistency: Thin  Diet effective now                  Disposition Plan  :   Status is: Inpatient  Remains inpatient appropriate because:Inpatient level of care appropriate due to severity of illness   Dispo: The patient is from: Home              Anticipated d/c is to: TBD              Anticipated d/c date is: > 1 days              Patient currently is  medically stable to d/c.   Barriers to discharge: Awaiting SNF bed  Antimicorbials  :    Anti-infectives (From admission, onward)   Start     Dose/Rate Route Frequency Ordered Stop   04/23/20 1000  remdesivir 100 mg in sodium chloride 0.9 % 100 mL IVPB       "Followed by" Linked Group Details   100 mg 200 mL/hr over 30 Minutes Intravenous Daily 04/22/20 0233 04/26/20 1203   04/22/20 0400  remdesivir 200 mg in sodium chloride 0.9% 250 mL IVPB       "Followed by" Linked Group Details   200 mg 580 mL/hr over 30 Minutes Intravenous Once 04/22/20 0233 04/22/20 0537      Inpatient Medications  Scheduled Meds: . amLODipine  5 mg Oral Daily  . apixaban  10 mg Oral BID   Followed by  . [START ON 04/30/2020] apixaban  5 mg Oral BID  . atorvastatin  40 mg Oral Daily  . metoprolol tartrate  25 mg Oral BID  . pantoprazole  40 mg Oral Daily  .  predniSONE  50 mg Oral Daily   Continuous Infusions:  PRN Meds:.acetaminophen **OR** acetaminophen (TYLENOL) oral liquid 160 mg/5 mL **OR** acetaminophen, hydrALAZINE   Time Spent in minutes 25   See all Orders from today for further details   Jeoffrey Massed M.D on 04/27/2020 at 10:47 AM  To page go to [email or web address] - use universal  password  [facility] -  Office  [phone]    Objective:   Vitals:   04/26/20 2000 04/27/20 0000 04/27/20 0400 04/27/20 0800  BP: (!) 142/87 (!) 145/87 (!) 154/97 (!) 166/90  Pulse: 88 86 77 69  Resp: 20 20 20  (!) 21  Temp: 98.3 F (36.8 C) 98.2 F (36.8 C) 98.1 F (36.7 C)   TempSrc: Oral Oral Oral   SpO2: 94% 94% 95% 95%  Weight:   98.2 kg   Height:        Wt Readings from Last 3 Encounters:  04/27/20 98.2 kg  03/26/17 86.2 kg  02/03/14 87.5 kg     Intake/Output Summary (Last 24 hours) at 04/27/2020 1047 Last data filed at 04/27/2020 0512 Gross per 24 hour  Intake 680 ml  Output 2 ml  Net 678 ml     Physical Exam Gen Exam:Alert awake-not in any distress HEENT:atraumatic, normocephalic Chest: B/L clear to auscultation anteriorly CVS:S1S2 regular Abdomen:soft non tender, non distended Extremities:no edema Neurology: Non focal Skin: no rash   Data Review:    CBC Recent Labs  Lab 04/23/20 0136 04/24/20 0154 04/25/20 0052 04/26/20 0153 04/27/20 0713  WBC 16.2* 19.4* 20.7* 15.4* 23.6*  HGB 11.8* 12.0 12.1 12.1 12.5  HCT 37.0 37.0 36.8 36.9 38.9  PLT 175 256 256 243 302  MCV 93.9 94.1 90.9 91.6 93.5  MCH 29.9 30.5 29.9 30.0 30.0  MCHC 31.9 32.4 32.9 32.8 32.1  RDW 13.7 13.8 13.7 13.7 13.9  LYMPHSABS 1.3 1.3 1.6 1.5 2.2  MONOABS 0.5 0.5 0.9 0.8 1.3*  EOSABS 0.0 0.0 0.0 0.0 0.0  BASOSABS 0.0 0.0 0.0 0.0 0.0    Chemistries  Recent Labs  Lab 04/23/20 0136 04/24/20 0154 04/25/20 0052 04/26/20 0153 04/27/20 0400  NA 143 143 142 139 138  K 4.4 4.0 3.6 3.8 5.1  CL 107 108 105 104 104  CO2 21* 21* 24 25 21*  GLUCOSE 121* 138* 144* 150* 145*  BUN 27* 27* 29* 32* 36*  CREATININE 1.42* 1.13* 1.25* 1.31* 1.29*  CALCIUM 8.2* 8.5* 8.8* 8.4* 8.4*  MG  --   --   --   --  2.6*  AST 35 31 29 27 25   ALT 20 20 23 23 18   ALKPHOS 63 73 75 68 77  BILITOT 1.0 1.5* 1.4* 1.1 1.2    ------------------------------------------------------------------------------------------------------------------ No results for input(s): CHOL, HDL, LDLCALC, TRIG, CHOLHDL, LDLDIRECT in the last 72 hours.  Lab Results  Component Value Date   HGBA1C 5.5 04/22/2020   ------------------------------------------------------------------------------------------------------------------ No results for input(s): TSH, T4TOTAL, T3FREE, THYROIDAB in the last 72 hours.  Invalid input(s): FREET3 ------------------------------------------------------------------------------------------------------------------ No results for input(s): VITAMINB12, FOLATE, FERRITIN, TIBC, IRON, RETICCTPCT in the last 72 hours.  Coagulation profile Recent Labs  Lab 04/21/20 1950  INR 1.5*    Recent Labs    04/26/20 0153 04/27/20 0400  DDIMER 17.76* 15.78*    Cardiac Enzymes No results for input(s): CKMB, TROPONINI, MYOGLOBIN in the last 168 hours.  Invalid input(s): CK ------------------------------------------------------------------------------------------------------------------    Component Value Date/Time   BNP 103.4 (H) 04/25/2020 8469    Micro Results Recent  Results (from the past 240 hour(s))  Respiratory Panel by RT PCR (Flu A&B, Covid) - Nasopharyngeal Swab     Status: Abnormal   Collection Time: 04/21/20  6:41 PM   Specimen: Nasopharyngeal Swab  Result Value Ref Range Status   SARS Coronavirus 2 by RT PCR POSITIVE (A) NEGATIVE Final    Comment: RESULT CALLED TO, READ BACK BY AND VERIFIED WITH: J FERRAIOLO 04/22/20 AT 0016 SK (NOTE) SARS-CoV-2 target nucleic acids are DETECTED.  SARS-CoV-2 RNA is generally detectable in upper respiratory specimens  during the acute phase of infection. Positive results are indicative of the presence of the identified virus, but do not rule out bacterial infection or co-infection with other pathogens not detected by the test. Clinical correlation with  patient history and other diagnostic information is necessary to determine patient infection status. The expected result is Negative.  Fact Sheet for Patients:  https://www.moore.com/  Fact Sheet for Healthcare Providers: https://www.young.biz/  This test is not yet approved or cleared by the [place] FDA and  has been authorized for detection and/or diagnosis of SARS-CoV-2 by FDA under an Emergency Use Authorization (EUA).  This EUA will remain in effect (meaning this test can be use d) for the duration of  the COVID-19 declaration under Section 564(b)(1) of the Act, 21 U.S.C. section 360bbb-3(b)(1), unless the authorization is terminated or revoked sooner.      Influenza A by PCR NEGATIVE NEGATIVE Final   Influenza B by PCR NEGATIVE NEGATIVE Final    Comment: (NOTE) The Xpert Xpress SARS-CoV-2/FLU/RSV assay is intended as an aid in  the diagnosis of influenza from Nasopharyngeal swab specimens and  should not be used as a sole basis for treatment. Nasal washings and  aspirates are unacceptable for Xpert Xpress SARS-CoV-2/FLU/RSV  testing.  Fact Sheet for Patients: https://www.moore.com/  Fact Sheet for Healthcare Providers: https://www.young.biz/  This test is not yet approved or cleared by the [place] FDA and  has been authorized for detection and/or diagnosis of SARS-CoV-2 by  FDA under an Emergency Use Authorization (EUA). This EUA will remain  in effect (meaning this test can be used) for the duration of the  Covid-19 declaration under Section 564(b)(1) of the Act, 21  U.S.C. section 360bbb-3(b)(1), unless the authorization is  terminated or revoked. Performed at [facility], [address], [place], [place] [address]   Blood culture (routine x 2)     Status: None   Collection Time: 04/21/20  7:50 PM   Specimen: BLOOD  Result Value Ref Range Status   Specimen  Description BLOOD SITE NOT SPECIFIED  Final   Special Requests   Final    BOTTLES DRAWN AEROBIC AND ANAEROBIC Blood Culture adequate volume   Culture   Final    NO GROWTH 5 DAYS Performed at [facility], [address], [place], [place] [address]    Report Status 04/26/2020 FINAL  Final  Blood culture (routine x 2)     Status: None   Collection Time: 04/21/20  9:10 PM   Specimen: BLOOD  Result Value Ref Range Status   Specimen Description BLOOD SITE NOT SPECIFIED  Final   Special Requests   Final    BOTTLES DRAWN AEROBIC AND ANAEROBIC Blood Culture results may not be optimal due to an inadequate volume of blood received in culture bottles   Culture   Final    NO GROWTH 5 DAYS Performed at [facility], [address] [place], [place]  [address]    Report Status 04/26/2020 FINAL  Final    Radiology Reports DG Abd 1 View  Result Date: 04/22/2020 CLINICAL DATA:  MRI clearance.  Altered mental status. EXAM: ABDOMEN - 1 VIEW COMPARISON:  None. FINDINGS: The bowel gas pattern is normal. No radio-opaque calculi or other significant radiographic abnormality are seen. There appear to be hazy airspace opacities involving the lungs bilaterally. No metallic foreign body identified. There are degenerative changes of both hips, right worse than left. There are degenerative changes of the lumbar spine. IMPRESSION: 1. No metallic foreign body identified. 2. Hazy airspace opacities involving the lungs bilaterally. 3. Nonobstructive bowel gas pattern. Electronically Signed   By: Katherine Mantle M.D.   On: 04/22/2020 00:23   CT Head Wo Contrast  Result Date: 04/21/2020 CLINICAL DATA:  79 year old female with intermittent dysarthria and left side weakness x1 week, progressive since 1400 hours on 04/20/2020. Hypoxia on initial presentation. EXAM: CT HEAD WITHOUT CONTRAST TECHNIQUE: Contiguous axial images were obtained from the base of the skull through the vertex without intravenous  contrast. COMPARISON:  None. FINDINGS: Brain: Small chronic infarct in the left cerebellum (series 3, image 8). No acute intracranial hemorrhage identified. No midline shift, mass effect, or evidence of intracranial mass lesion. No ventriculomegaly. Mild for age scattered bilateral white matter hypodensity. Small age indeterminate hypodense area in the posterior right insula on series 3, image 15. No other no acute or subacute cortically based infarct identified. No supratentorial cortical encephalomalacia identified. Deep gray matter nuclei and brainstem appear within normal limits. Vascular: Calcified atherosclerosis at the skull base. No suspicious intracranial vascular hyperdensity. Skull: No acute osseous abnormality identified. Sinuses/Orbits: Small fluid level in the left maxillary sinus. Bubbly opacity there as well as in the right maxillary and sphenoid sinuses. Bubbly opacity in the posterior left ethmoid. Tympanic cavities and mastoids appear clear. Other: No acute orbit or scalp soft tissue finding identified. IMPRESSION: 1. Small infarct at the posterior right insula, age indeterminate but suspicious for subacute ischemia in this setting. No associated hemorrhage or mass effect. 2. Elsewhere mild for age cerebral white matter changes, and a small chronic left cerebellar infarct. 3. Acute paranasal sinus inflammation. Electronically Signed   By: Odessa Fleming M.D.   On: 04/21/2020 22:56   MR ANGIO HEAD WO CONTRAST  Result Date: 04/22/2020 CLINICAL DATA:  Follow-up examination for acute stroke. EXAM: MRI HEAD WITHOUT CONTRAST MRA HEAD WITHOUT CONTRAST TECHNIQUE: Multiplanar, multiecho pulse sequences of the brain and surrounding structures were obtained without intravenous contrast. Angiographic images of the head were obtained using MRA technique without contrast. COMPARISON:  Prior CT from 04/21/2020. FINDINGS: MRI HEAD FINDINGS Brain: Examination degraded by motion artifact. Generalized age-related  cerebral atrophy. Mild chronic microvascular ischemic disease present within the periventricular and deep white matter both cerebral hemispheres. Small remote left cerebellar infarct noted. Patchy restricted diffusion seen involving the posterior right insula as well as the overlying right posterior frontal and parietal lobes, consistent with right MCA territory infarcts. No significant mass effect or associated hemorrhage. An embolic etiology is suspected given distribution. No other diffusion abnormality to suggest acute or subacute ischemia. Gray-white matter differentiation otherwise maintained. No other areas of encephalomalacia to suggest chronic cortical infarction on this motion degraded exam. No other definite foci of susceptibility artifact to suggest acute or chronic intracranial hemorrhage. No mass lesion, midline shift or mass effect. No hydrocephalus or extra-axial fluid collection. No made of a partially empty sella. Midline structures intact. Vascular: Major intracranial vascular  flow voids are maintained. Skull and upper cervical spine: Craniocervical junction within normal limits. Multilevel cervical spondylosis noted within the upper cervical spine without high-grade stenosis. Bone marrow signal intensity normal. No scalp soft tissue abnormality. Sinuses/Orbits: Globes and orbital soft tissues within normal limits. Scattered mucosal thickening noted throughout the ethmoidal air cells and maxillary sinuses. Superimposed air-fluid level noted within the left maxillary sinus. Trace bilateral mastoid effusions. Visualized nasopharynx within normal limits. Inner ear structures grossly normal. Other: None. MRA HEAD FINDINGS ANTERIOR CIRCULATION: Examination somewhat degraded by motion artifact. Visualized distal cervical segments of the internal carotid arteries are widely patent with antegrade flow. Petrous, cavernous, and supraclinoid ICAs demonstrate probable atheromatous irregularity without  appreciable hemodynamically significant stenosis. A1 segments patent bilaterally. Normal anterior communicating artery complex. Anterior cerebral arteries patent to their distal aspects without stenosis. No M1 stenosis or occlusion. Normal MCA bifurcations. Distal MCA branches well perfused and fairly symmetric. POSTERIOR CIRCULATION: Vertebral arteries patent to the vertebrobasilar junction without stenosis. Both PICAs patent proximally. Basilar patent to its distal aspect without stenosis. Superior cerebral arteries patent bilaterally. Right PCA supplied via a hypoplastic right P1 segment and prominent right posterior communicating artery. Fetal type origin left PCA. Both PCAs perfused to their distal aspects without proximal flow-limiting stenosis. No aneurysm. IMPRESSION: MRI HEAD IMPRESSION: 1. 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 An embolic etiology is suspected. No significant mass effect. 2. Underlying age-related cerebral atrophy with mild chronic small vessel ischemic disease. Small remote left cerebellar infarct. MRA HEAD IMPRESSION: 1. Technically limited exam due to motion artifact. 2. Negative intracranial MRA for large vessel occlusion. No hemodynamically significant or correctable stenosis. 3. Mild atheromatous irregularity about the carotid siphons without significant stenosis. Electronically Signed   By: Rise Mu M.D.   On: 04/22/2020 01:29   MR BRAIN WO CONTRAST  Result Date: 04/22/2020 CLINICAL DATA:  Follow-up examination for acute stroke. EXAM: MRI HEAD WITHOUT CONTRAST MRA HEAD WITHOUT CONTRAST TECHNIQUE: Multiplanar, multiecho pulse sequences of the brain and surrounding structures were obtained without intravenous contrast. Angiographic images of the head were obtained using MRA technique without contrast. COMPARISON:  Prior CT from 04/21/2020. FINDINGS:  MRI HEAD FINDINGS Brain: Examination degraded by motion artifact. Generalized age-related cerebral atrophy. Mild chronic microvascular ischemic disease present within the periventricular and deep white matter both cerebral hemispheres. Small remote left cerebellar infarct noted. Patchy restricted diffusion seen involving the posterior right insula as well as the overlying right posterior frontal and parietal lobes, consistent with right MCA territory infarcts. No significant mass effect or associated hemorrhage. An embolic etiology is suspected given distribution. No other diffusion abnormality to suggest acute or subacute ischemia. Gray-white matter differentiation otherwise maintained. No other areas of encephalomalacia to suggest chronic cortical infarction on this motion degraded exam. No other definite foci of susceptibility artifact to suggest acute or chronic intracranial hemorrhage. No mass lesion, midline shift or mass effect. No hydrocephalus or extra-axial fluid collection. No made of a partially empty sella. Midline structures intact. Vascular: Major intracranial vascular flow voids are maintained. Skull and upper cervical spine: Craniocervical junction within normal limits. Multilevel cervical spondylosis noted within the upper cervical spine without high-grade stenosis. Bone marrow signal intensity normal. No scalp soft tissue abnormality. Sinuses/Orbits: Globes and orbital soft tissues within normal limits. Scattered mucosal thickening noted throughout the ethmoidal air cells and maxillary sinuses. Superimposed air-fluid level noted within the left maxillary sinus. Trace bilateral mastoid effusions.  Visualized nasopharynx within normal limits. Inner ear structures grossly normal. Other: None. MRA HEAD FINDINGS ANTERIOR CIRCULATION: Examination somewhat degraded by motion artifact. Visualized distal cervical segments of the internal carotid arteries are widely patent with antegrade flow. Petrous,  cavernous, and supraclinoid ICAs demonstrate probable atheromatous irregularity without appreciable hemodynamically significant stenosis. A1 segments patent bilaterally. Normal anterior communicating artery complex. Anterior cerebral arteries patent to their distal aspects without stenosis. No M1 stenosis or occlusion. Normal MCA bifurcations. Distal MCA branches well perfused and fairly symmetric. POSTERIOR CIRCULATION: Vertebral arteries patent to the vertebrobasilar junction without stenosis. Both PICAs patent proximally. Basilar patent to its distal aspect without stenosis. Superior cerebral arteries patent bilaterally. Right PCA supplied via a hypoplastic right P1 segment and prominent right posterior communicating artery. Fetal type origin left PCA. Both PCAs perfused to their distal aspects without proximal flow-limiting stenosis. No aneurysm. IMPRESSION: MRI HEAD IMPRESSION: 1. 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 An embolic etiology is suspected. No significant mass effect. 2. Underlying age-related cerebral atrophy with mild chronic small vessel ischemic disease. Small remote left cerebellar infarct. MRA HEAD IMPRESSION: 1. Technically limited exam due to motion artifact. 2. Negative intracranial MRA for large vessel occlusion. No hemodynamically significant or correctable stenosis. 3. Mild atheromatous irregularity about the carotid siphons without significant stenosis. Electronically Signed   By: Rise Mu M.D.   On: 04/22/2020 01:29   DG Chest Portable 1 View  Result Date: 04/21/2020 CLINICAL DATA:  Hypoxia EXAM: PORTABLE CHEST 1 VIEW COMPARISON:  None. FINDINGS: The heart size is enlarged. There are hazy bilateral airspace opacities. There is no pneumothorax. No large pleural effusion. There are end-stage degenerative changes of both glenohumeral joints. IMPRESSION:  Cardiomegaly with hazy bilateral airspace opacities concerning for pulmonary edema or an atypical infectious process. Electronically Signed   By: Katherine Mantle M.D.   On: 04/21/2020 20:21   ECHOCARDIOGRAM COMPLETE  Result Date: 04/23/2020    ECHOCARDIOGRAM REPORT   Patient Name:   ARAIYAH CUMPTON Date of Exam: 04/23/2020 Medical Rec #:  [id number]     Height:       65.0 in Accession #:    [id number]    Weight:       183.4 lb Date of Birth:  03-Jan-1941      BSA:          1.907 m Patient Age:    79 years      BP:           151/94 mmHg Patient Gender: F             HR:           79 bpm. Exam Location:  Inpatient Procedure: 2D Echo, Cardiac Doppler and Color Doppler Indications:    Stroke 434.91 / I163.9  History:        Patient has no prior history of Echocardiogram examinations.                 Risk Factors:Hypertension and Dyslipidemia. COVID-19 Positive.  Sonographer:    Elmarie Shiley Dance Referring Phys: [id number] ARSHAD N KAKRAKANDY IMPRESSIONS  1. Left ventricular ejection fraction, by estimation, is 60 to 65%. The left ventricle has normal function. The left ventricle has no regional wall motion abnormalities. Left ventricular diastolic parameters are consistent with Grade I diastolic dysfunction (impaired relaxation).  2. Right ventricular systolic function is normal. The right ventricular size is normal. There is normal pulmonary  artery systolic pressure. The estimated right ventricular systolic pressure is 27.4 mmHg.  3. The mitral valve is normal in structure. Trivial mitral valve regurgitation. No evidence of mitral stenosis.  4. The aortic valve is normal in structure. There is moderate calcification of the aortic valve. There is moderate thickening of the aortic valve. Aortic valve regurgitation is trivial. Mild to moderate aortic valve sclerosis/calcification is present, without any evidence of aortic stenosis. Aortic valve mean gradient measures 8.5 mmHg. Aortic valve Vmax measures 2.13 m/s.  5. The  inferior vena cava is normal in size with greater than 50% respiratory variability, suggesting right atrial pressure of 3 mmHg. Conclusion(s)/Recommendation(s): No intracardiac source of embolism detected on this transthoracic study. A transesophageal echocardiogram is recommended to exclude cardiac source of embolism if clinically indicated. FINDINGS  Left Ventricle: Left ventricular ejection fraction, by estimation, is 60 to 65%. The left ventricle has normal function. The left ventricle has no regional wall motion abnormalities. The left ventricular internal cavity size was normal in size. There is  no left ventricular hypertrophy. Left ventricular diastolic parameters are consistent with Grade I diastolic dysfunction (impaired relaxation). Right Ventricle: The right ventricular size is normal. No increase in right ventricular wall thickness. Right ventricular systolic function is normal. There is normal pulmonary artery systolic pressure. The tricuspid regurgitant velocity is 2.47 m/s, and  with an assumed right atrial pressure of 3 mmHg, the estimated right ventricular systolic pressure is 27.4 mmHg. Left Atrium: Left atrial size was normal in size. Right Atrium: Right atrial size was normal in size. Pericardium: There is no evidence of pericardial effusion. Mitral Valve: The mitral valve is normal in structure. Trivial mitral valve regurgitation. No evidence of mitral valve stenosis. Tricuspid Valve: The tricuspid valve is normal in structure. Tricuspid valve regurgitation is mild . No evidence of tricuspid stenosis. Aortic Valve: The aortic valve is normal in structure. There is moderate calcification of the aortic valve. There is moderate thickening of the aortic valve. Aortic valve regurgitation is trivial. Mild to moderate aortic valve sclerosis/calcification is present, without any evidence of aortic stenosis. Aortic valve mean gradient measures 8.5 mmHg. Aortic valve peak gradient measures 18.2 mmHg.  Aortic valve area, by VTI measures 1.58 cm. Pulmonic Valve: The pulmonic valve was normal in structure. Pulmonic valve regurgitation is not visualized. No evidence of pulmonic stenosis. Aorta: The aortic root is normal in size and structure. Venous: The inferior vena cava is normal in size with greater than 50% respiratory variability, suggesting right atrial pressure of 3 mmHg. IAS/Shunts: No atrial level shunt detected by color flow Doppler.  LEFT VENTRICLE PLAX 2D LVIDd:         3.90 cm  Diastology LVIDs:         2.72 cm  LV e' medial:    4.35 cm/s LV PW:         0.92 cm  LV E/e' medial:  14.5 LV IVS:        1.15 cm  LV e' lateral:   6.42 cm/s LVOT diam:     2.00 cm  LV E/e' lateral: 9.8 LV SV:         63 LV SV Index:   33 LVOT Area:     3.14 cm  RIGHT VENTRICLE             IVC RV Basal diam:  2.65 cm     IVC diam: 1.81 cm RV S prime:     12.00 cm/s TAPSE (M-mode): 2.0 cm  LEFT ATRIUM             Index       RIGHT ATRIUM          Index LA diam:        3.20 cm 1.68 cm/m  RA Area:     9.10 cm LA Vol (A2C):   56.4 ml 29.58 ml/m RA Volume:   14.70 ml 7.71 ml/m LA Vol (A4C):   59.4 ml 31.15 ml/m LA Biplane Vol: 60.1 ml 31.52 ml/m  AORTIC VALVE AV Area (Vmax):    1.49 cm AV Area (Vmean):   1.39 cm AV Area (VTI):     1.58 cm AV Vmax:           213.50 cm/s AV Vmean:          135.000 cm/s AV VTI:            0.395 m AV Peak Grad:      18.2 mmHg AV Mean Grad:      8.5 mmHg LVOT Vmax:         101.40 cm/s LVOT Vmean:        59.550 cm/s LVOT VTI:          0.199 m LVOT/AV VTI ratio: 0.50  AORTA Ao Root diam: 2.80 cm Ao Asc diam:  3.40 cm MITRAL VALVE                TRICUSPID VALVE MV Area (PHT): 2.45 cm     TR Peak grad:   24.4 mmHg MV Decel Time: 310 msec     TR Vmax:        247.00 cm/s MV E velocity: 62.90 cm/s MV A velocity: 103.00 cm/s  SHUNTS MV E/A ratio:  0.61         Systemic VTI:  0.20 m                             Systemic Diam: 2.00 cm Donato Schultz MD Electronically signed by Donato Schultz MD Signature  Date/Time: 04/23/2020/2:48:24 PM    Final    VAS US CAROTID (at [facility] and [facility] only)  Result Date: 04/22/2020 Carotid Arterial Duplex Study Indications:       CVA and DVT positive. Risk Factors:      Hypertension. Other Factors:     COVID 19 positive. Comparison Study:  No prior studies. Performing Technologist: Chanda Busing RVT  Examination Guidelines: A complete evaluation includes B-mode imaging, spectral Doppler, color Doppler, and power Doppler as needed of all accessible portions of each vessel. Bilateral testing is considered an integral part of a complete examination. Limited examinations for reoccurring indications may be performed as noted.  Right Carotid Findings: +----------+--------+--------+--------+-----------------------+--------+           PSV cm/sEDV cm/sStenosisPlaque Description     Comments +----------+--------+--------+--------+-----------------------+--------+ CCA Prox  56      5               smooth and heterogenous         +----------+--------+--------+--------+-----------------------+--------+ CCA Distal49      11              smooth and heterogenous         +----------+--------+--------+--------+-----------------------+--------+ ICA Prox  48      14              smooth and heterogenous         +----------+--------+--------+--------+-----------------------+--------+ ICA Distal87  24                                     tortuous +----------+--------+--------+--------+-----------------------+--------+ ECA       59      6                                               +----------+--------+--------+--------+-----------------------+--------+ +----------+--------+-------+--------+-------------------+           PSV cm/sEDV cmsDescribeArm Pressure (mmHG) +----------+--------+-------+--------+-------------------+ [id number]                                         +----------+--------+-------+--------+-------------------+  +---------+--------+--+--------+--+---------+ VertebralPSV cm/s59EDV cm/s13Antegrade +---------+--------+--+--------+--+---------+  Left Carotid Findings: +----------+--------+--------+--------+-----------------------+--------+           PSV cm/sEDV cm/sStenosisPlaque Description     Comments +----------+--------+--------+--------+-----------------------+--------+ CCA Prox  97      19              smooth and heterogenous         +----------+--------+--------+--------+-----------------------+--------+ CCA Distal49      11              smooth and heterogenous         +----------+--------+--------+--------+-----------------------+--------+ ICA Prox  37      11              smooth and heterogenous         +----------+--------+--------+--------+-----------------------+--------+ ICA Distal59      22                                     tortuous +----------+--------+--------+--------+-----------------------+--------+ ECA       44      5                                               +----------+--------+--------+--------+-----------------------+--------+ +----------+--------+--------+--------+-------------------+           PSV cm/sEDV cm/sDescribeArm Pressure (mmHG) +----------+--------+--------+--------+-------------------+ [id number]                                          +----------+--------+--------+--------+-------------------+ +---------+--------+--+--------+--+---------+ VertebralPSV cm/s33EDV cm/s10Antegrade +---------+--------+--+--------+--+---------+   Summary: Right Carotid: Velocities in the right ICA are consistent with a 1-39% stenosis. Left Carotid: Velocities in the left ICA are consistent with a 1-39% stenosis. Vertebrals: Bilateral vertebral arteries demonstrate antegrade flow. *See table(s) above for measurements and observations.  Electronically signed by Delia Heady MD on 04/22/2020 at 1:17:39 PM.    Final    VAS [place] LOWER  EXTREMITY VENOUS (DVT)  Result Date: 04/22/2020  Lower Venous DVT Study Indications: Elevated Ddimer.  Risk Factors: COVID 19 positive. Comparison Study: No prior studies. Performing Technologist: Chanda Busing RVT  Examination Guidelines: A complete evaluation includes B-mode imaging, spectral Doppler, color Doppler, and power Doppler as needed of all accessible portions of each vessel. Bilateral testing is considered an integral part of a complete examination. Limited examinations  for reoccurring indications may be performed as noted. The reflux portion of the exam is performed with the patient in reverse Trendelenburg.  +---------+---------------+---------+-----------+----------+-----------------+ RIGHT    CompressibilityPhasicitySpontaneityPropertiesThrombus Aging    +---------+---------------+---------+-----------+----------+-----------------+ CFV      Full           Yes      Yes                                    +---------+---------------+---------+-----------+----------+-----------------+ SFJ      Full                                                           +---------+---------------+---------+-----------+----------+-----------------+ FV Prox  Full                                                           +---------+---------------+---------+-----------+----------+-----------------+ FV Mid   Partial        No       No                   Age Indeterminate +---------+---------------+---------+-----------+----------+-----------------+ FV DistalNone           No       No                   Age Indeterminate +---------+---------------+---------+-----------+----------+-----------------+ PFV      Full                                                           +---------+---------------+---------+-----------+----------+-----------------+ POP      None           No       No                   Age Indeterminate  +---------+---------------+---------+-----------+----------+-----------------+ PTV      None                                         Age Indeterminate +---------+---------------+---------+-----------+----------+-----------------+ PERO     None                                         Age Indeterminate +---------+---------------+---------+-----------+----------+-----------------+ Gastroc  None                                         Age Indeterminate +---------+---------------+---------+-----------+----------+-----------------+   +---------+---------------+---------+-----------+----------+--------------+ LEFT     CompressibilityPhasicitySpontaneityPropertiesThrombus Aging +---------+---------------+---------+-----------+----------+--------------+ CFV      Full           Yes      Yes                                 +---------+---------------+---------+-----------+----------+--------------+  SFJ      Full                                                        +---------+---------------+---------+-----------+----------+--------------+ FV Prox  Full                                                        +---------+---------------+---------+-----------+----------+--------------+ FV Mid   Full                                                        +---------+---------------+---------+-----------+----------+--------------+ FV DistalFull                                                        +---------+---------------+---------+-----------+----------+--------------+ PFV      Full                                                        +---------+---------------+---------+-----------+----------+--------------+ POP      None           No       No                   Acute          +---------+---------------+---------+-----------+----------+--------------+ PTV      None                                         Acute           +---------+---------------+---------+-----------+----------+--------------+ PERO     None                                         Acute          +---------+---------------+---------+-----------+----------+--------------+    Summary: RIGHT: - Findings consistent with age indeterminate deep vein thrombosis involving the right femoral vein, right popliteal vein, right posterior tibial veins, right peroneal veins, and right gastrocnemius veins. - No cystic structure found in the popliteal fossa.  LEFT: - Findings consistent with acute deep vein thrombosis involving the left popliteal vein, left posterior tibial veins, and left peroneal veins. - No cystic structure found in the popliteal fossa.  *See table(s) above for measurements and observations. Electronically signed by Gretta Beganodd Early MD on 04/22/2020 at 2:50:20 PM.    Final

## 2020-04-28 DIAGNOSIS — I10 Essential (primary) hypertension: Secondary | ICD-10-CM

## 2020-04-28 LAB — CBC
HCT: 40.3 % (ref 36.0–46.0)
Hemoglobin: 12.8 g/dL (ref 12.0–15.0)
MCH: 30.3 pg (ref 26.0–34.0)
MCHC: 31.8 g/dL (ref 30.0–36.0)
MCV: 95.3 fL (ref 80.0–100.0)
Platelets: 299 10*3/uL (ref 150–400)
RBC: 4.23 MIL/uL (ref 3.87–5.11)
RDW: 14.2 % (ref 11.5–15.5)
WBC: 22.6 10*3/uL — ABNORMAL HIGH (ref 4.0–10.5)
nRBC: 0 % (ref 0.0–0.2)

## 2020-04-28 LAB — BASIC METABOLIC PANEL
Anion gap: 14 (ref 5–15)
BUN: 32 mg/dL — ABNORMAL HIGH (ref 8–23)
CO2: 18 mmol/L — ABNORMAL LOW (ref 22–32)
Calcium: 8.1 mg/dL — ABNORMAL LOW (ref 8.9–10.3)
Chloride: 104 mmol/L (ref 98–111)
Creatinine, Ser: 1.13 mg/dL — ABNORMAL HIGH (ref 0.44–1.00)
GFR, Estimated: 50 mL/min — ABNORMAL LOW (ref >60)
Glucose, Bld: 155 mg/dL — ABNORMAL HIGH (ref 70–99)
Potassium: 5.4 mmol/L — ABNORMAL HIGH (ref 3.5–5.1)
Sodium: 136 mmol/L (ref 135–145)

## 2020-04-28 LAB — GLUCOSE, CAPILLARY
Glucose-Capillary: 162 mg/dL — ABNORMAL HIGH (ref 70–99)
Glucose-Capillary: 103 mg/dL — ABNORMAL HIGH (ref 70–99)
Glucose-Capillary: 105 mg/dL — ABNORMAL HIGH (ref 70–99)

## 2020-04-28 MED ORDER — SODIUM ZIRCONIUM CYCLOSILICATE 10 G PO PACK
10.0000 g | PACK | Freq: Three times a day (TID) | ORAL | Status: DC
  Administered 2020-04-28: 10 g via ORAL
  Filled 2020-04-28: qty 1

## 2020-04-28 MED ORDER — METOPROLOL TARTRATE 25 MG PO TABS
50.0000 mg | ORAL_TABLET | Freq: Two times a day (BID) | ORAL | Status: DC
Start: 2020-04-28 — End: 2020-08-22

## 2020-04-28 MED ORDER — AMLODIPINE BESYLATE 5 MG PO TABS
5.0000 mg | ORAL_TABLET | Freq: Every day | ORAL | Status: DC
Start: 2020-04-29 — End: 2020-08-14

## 2020-04-28 MED ORDER — PANTOPRAZOLE SODIUM 40 MG PO TBEC
40.0000 mg | DELAYED_RELEASE_TABLET | Freq: Every day | ORAL | Status: DC
Start: 2020-04-29 — End: 2020-10-24

## 2020-04-28 MED ORDER — PREDNISONE 10 MG PO TABS: ORAL_TABLET | ORAL | 0 refills | Status: DC

## 2020-04-28 MED ORDER — ATORVASTATIN CALCIUM 40 MG PO TABS: 40.0000 mg | ORAL_TABLET | Freq: Every day | ORAL | Status: AC

## 2020-04-28 MED ORDER — APIXABAN 5 MG PO TABS
10.0000 mg | ORAL_TABLET | Freq: Two times a day (BID) | ORAL | Status: DC
Start: 2020-04-28 — End: 2020-06-06

## 2020-04-28 MED ORDER — APIXABAN 5 MG PO TABS
5.0000 mg | ORAL_TABLET | Freq: Two times a day (BID) | ORAL | Status: DC
Start: 2020-04-30 — End: 2020-06-25

## 2020-04-28 NOTE — Progress Notes (Deleted)
[name] Age: 79 y.o. Sex: female Date of Birth: Oct 24, 1940 MRN: [id number]. Admitting Physician: Eduard Clos, MD IDP:[id number], Provider Not In  Admit Date: 04/21/2020 Discharge date: 04/28/2020  Recommendations for Outpatient Follow-up:  1. Follow up with PCP in 1-2 weeks 2. Please obtain CMP/CBC in one week 3. Repeat Chest Xray in 4-6 week 4. Titrate off oxygen as tolerated over the next few weeks.  Admitted From:  Home  Disposition: SNF   Home Health: No  Equipment/Devices: oxygen 1-2 L at rest-on 4L with ambulation  Discharge Condition: Stable  CODE STATUS: FULL CODE  Diet recommendation:  Diet Order            Diet - low sodium heart healthy           Diet regular Room service appropriate? Yes; Fluid consistency: Thin  Diet effective now                  Brief Narrative: Patient is a 79 y.o. female with PMHx of HTN-who presented with left-sided weakness and shortness of breath-was found to have acute CVA along with severe hypoxic respiratory failure due to COVID-19 pneumonitis.  See below for further details.  COVID-19 vaccinated status: Unvaccinated  Significant Events: 11/8>> presented to [facility] with left-sided weakness due to CVA and severe hypoxia due to COVID-19 pneumonia  Significant studies: 11/8>> chest x-ray: Hazy bilateral airspace opacities 11/8>> CT head: Small infarct in the posterior right insula. 11/9>> MRI brain: Acute ischemia in the right MCA territory 11/9>> MRI brain: No large vessel occlusion 11/9>> carotid Doppler: No significant stenosis 11/9>> lower extremity Doppler: Bilateral lower extremity DVT and multiple veins. 11/9>> A1c: 5.5 11/9>> LDL: 93 11/10>> Echo: EF 60-65%, grade 1 diastolic dysfunction, RV systolic function is normal.  [id number] medications: Steroids: 11/8>> Remdesivir: 11/8>>11/13 Actemra: 11/8 x 1  Antibiotics: None  Microbiology data: 11/8 >>blood culture: No  growth  Procedures: None  Consults: None  Brief Hospital Course: Acute Hypoxic Resp Failure due to Covid 19 Viral pneumonia: Had severe hypoxemia when she first presented to the hospital-remarkable improvement -down to around 2 L of oxygen at rest-occurred around 4 L of oxygen with ambulation. Although significantly better-continues to have some amount of exertional shortness of breath with ambulation-she is essentially asymptomatic at rest. Has completed a course of Remdesivir-was given 1 dose of Actemra-will be discharged on tapering prednisone. Please continue to assess and titrate down oxygen over the next few weeks.  COVID-19 Labs:  Recent Labs    04/26/20 0153 04/27/20 0400  DDIMER 17.76* 15.78*  CRP 3.4* 1.7*    Lab Results  Component Value Date   SARSCOV2NAA POSITIVE (A) 04/21/2020    Bilateral lower extremity DVT: Likely provoked by COVID-19-initially on IV heparin-but has been switched to Eliquis. Per patient-she has chronic swelling of the lower extremities and they are currently at baseline.  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  Left-sided weakness has improved-SLP evaluation completed on 11/10-has been started on a diet.   Appreciate stroke team MD evaluation-Per my conversation with Dr. Pearlean Brownie on 11/11-okay to remain on Eliquis-aspirin not needed.  Continue high intensity statin. Please ensure outpatient follow-up with neurology.  HTN: BP relatively better-continue Lopressor-and amlodipine-reassess at [facility] and optimize accordingly.  AKI on CKD stage IIIa: AKI likely hemodynamically mediated-improved with supportive care.  Avoid nephrotoxic agents.   Obesity: Estimated body mass index is 36.03 kg/m  as calculated from the following:   Height as of this encounter: 5\' 5"  (1.651 m).   Weight as of this encounter: 98.2 kg  Discharge Diagnoses:  Principal  Problem:   Acute CVA (cerebrovascular accident) (HCC) Active Problems:   Acute respiratory failure due to COVID-19 [facility])   ARF (acute renal failure) (HCC)   Essential hypertension   Discharge Instructions:    Person Under Monitoring Name: Beverly Howell  Location: [address] [facility] [place] [address]   Infection Prevention Recommendations for Individuals Confirmed to have, or Being Evaluated for, 2019 Novel Coronavirus (COVID-19) Infection Who Receive Care at Home  Individuals who are confirmed to have, or are being evaluated for, COVID-19 should follow the prevention steps below until a healthcare provider or local or state health department says they can return to normal activities.  Stay home except to get medical care You should restrict activities outside your home, except for getting medical care. Do not go to work, school, or public areas, and do not use public transportation or taxis.  Call ahead before visiting your doctor Before your medical appointment, call the healthcare provider and tell them that you have, or are being evaluated for, COVID-19 infection. This will help the healthcare provider's office take steps to keep other people from getting infected. Ask your healthcare provider to call the local or state health department.  Monitor your symptoms Seek prompt medical attention if your illness is worsening (e.g., difficulty breathing). Before going to your medical appointment, call the healthcare provider and tell them that you have, or are being evaluated for, COVID-19 infection. Ask your healthcare provider to call the local or state health department.  Wear a facemask You should wear a facemask that covers your nose and mouth when you are in the same room with other people and when you visit a healthcare provider. People who live with or visit you should also wear a facemask while they are in the same room with you.  Separate yourself from other  people in your home As much as possible, you should stay in a different room from other people in your home. Also, you should use a separate bathroom, if available.  Avoid sharing household items You should not share dishes, drinking glasses, cups, eating utensils, towels, bedding, or other items with other people in your home. After using these items, you should wash them thoroughly with soap and water.  Cover your coughs and sneezes Cover your mouth and nose with a tissue when you cough or sneeze, or you can cough or sneeze into your sleeve. Throw used tissues in a lined trash can, and immediately wash your hands with soap and water for at least 20 seconds or use an alcohol-based hand rub.  Wash your [employer] your hands often and thoroughly with soap and water for at least 20 seconds. You can use an alcohol-based hand sanitizer if soap and water are not available and if your hands are not visibly dirty. Avoid touching your eyes, nose, and mouth with unwashed hands.   Prevention Steps for Caregivers and Household Members of Individuals Confirmed to have, or Being Evaluated for, COVID-19 Infection Being Cared for in the Home  If you live with, or provide care at home for, a person confirmed to have, or being evaluated for, COVID-19 infection please follow these guidelines to prevent infection:  Follow healthcare provider's instructions Make sure that you understand and can help the patient follow any healthcare provider instructions for  all care.  Provide for the patient's basic needs You should help the patient with basic needs in the home and provide support for getting groceries, prescriptions, and other personal needs.  Monitor the patient's symptoms If they are getting sicker, call his or her medical provider and tell them that the patient has, or is being evaluated for, COVID-19 infection. This will help the healthcare provider's office take steps to keep other people from  getting infected. Ask the healthcare provider to call the local or state health department.  Limit the number of people who have contact with the patient  If possible, have only one caregiver for the patient.  Other household members should stay in another home or place of residence. If this is not possible, they should stay  in another room, or be separated from the patient as much as possible. Use a separate bathroom, if available.  Restrict visitors who do not have an essential need to be in the home.  Keep older adults, very young children, and other sick people away from the patient Keep older adults, very young children, and those who have compromised immune systems or chronic health conditions away from the patient. This includes people with chronic heart, lung, or kidney conditions, diabetes, and cancer.  Ensure good ventilation Make sure that shared spaces in the home have good air flow, such as from an air conditioner or an opened window, weather permitting.  Wash your hands often  Wash your hands often and thoroughly with soap and water for at least 20 seconds. You can use an alcohol based hand sanitizer if soap and water are not available and if your hands are not visibly dirty.  Avoid touching your eyes, nose, and mouth with unwashed hands.  Use disposable paper towels to dry your hands. If not available, use dedicated cloth towels and replace them when they become wet.  Wear a facemask and gloves  Wear a disposable facemask at all times in the room and gloves when you touch or have contact with the patient's blood, body fluids, and/or secretions or excretions, such as sweat, saliva, sputum, nasal mucus, vomit, urine, or feces.  Ensure the mask fits over your nose and mouth tightly, and do not touch it during use.  Throw out disposable facemasks and gloves after using them. Do not reuse.  Wash your hands immediately after removing your facemask and gloves.  If your  personal clothing becomes contaminated, carefully remove clothing and launder. Wash your hands after handling contaminated clothing.  Place all used disposable facemasks, gloves, and other waste in a lined container before disposing them with other household waste.  Remove gloves and wash your hands immediately after handling these items.  Do not share dishes, glasses, or other household items with the patient  Avoid sharing household items. You should not share dishes, drinking glasses, cups, eating utensils, towels, bedding, or other items with a patient who is confirmed to have, or being evaluated for, COVID-19 infection.  After the person uses these items, you should wash them thoroughly with soap and water.  Wash laundry thoroughly  Immediately remove and wash clothes or bedding that have blood, body fluids, and/or secretions or excretions, such as sweat, saliva, sputum, nasal mucus, vomit, urine, or feces, on them.  Wear gloves when handling laundry from the patient.  Read and follow directions on labels of laundry or clothing items and detergent. In general, wash and dry with the warmest temperatures recommended on the label.  Clean all  areas the individual has used often  Clean all touchable surfaces, such as counters, tabletops, doorknobs, bathroom fixtures, toilets, phones, keyboards, tablets, and bedside tables, every day. Also, clean any surfaces that may have blood, body fluids, and/or secretions or excretions on them.  Wear gloves when cleaning surfaces the patient has come in contact with.  Use a diluted bleach solution (e.g., dilute bleach with 1 part bleach and 10 parts water) or a household disinfectant with a label that says EPA-registered for coronaviruses. To make a bleach solution at home, add 1 tablespoon of bleach to 1 quart (4 cups) of water. For a larger supply, add  cup of bleach to 1 gallon (16 cups) of water.  Read labels of cleaning products and follow  recommendations provided on product labels. Labels contain instructions for safe and effective use of the cleaning product including precautions you should take when applying the product, such as wearing gloves or eye protection and making sure you have good ventilation during use of the product.  Remove gloves and wash hands immediately after cleaning.  Monitor yourself for signs and symptoms of illness Caregivers and household members are considered close contacts, should monitor their health, and will be asked to limit movement outside of the home to the extent possible. Follow the monitoring steps for close contacts listed on the symptom monitoring form.   ? If you have additional questions, contact your local health department or call the epidemiologist on call at [phone] (available 24/7). ? This guidance is subject to change. For the most up-to-date guidance from [facility], please refer to their website: [email or web address].hu    Activity:  As tolerated with Full fall precautions use walker/cane & assistance as needed   Discharge Instructions    Ambulatory referral to Neurology   Complete by: As directed    An appointment is requested in approximately: 4 weeks   Call MD for:  extreme fatigue   Complete by: As directed    Diet - low sodium heart healthy   Complete by: As directed    Discharge instructions   Complete by: As directed    Follow with Primary MD in 1-2 weeks  Please get a complete blood count and chemistry panel checked by your Primary MD at your next visit, and again as instructed by your Primary MD.  Get Medicines reviewed and adjusted: Please take all your medications with you for your next visit with your Primary MD  Laboratory/radiological data: Please request your Primary MD to go over all hospital tests and procedure/radiological results at the follow up, please ask your Primary MD to get all Hospital  records sent to his/her office.  In some cases, they will be blood work, cultures and biopsy results pending at the time of your discharge. Please request that your primary care M.D. follows up on these results.  Also Note the following: If you experience worsening of your admission symptoms, develop shortness of breath, life threatening emergency, suicidal or homicidal thoughts you must seek medical attention immediately by calling 911 or calling your MD immediately  if symptoms less severe.  You must read complete instructions/literature along with all the possible adverse reactions/side effects for all the Medicines you take and that have been prescribed to you. Take any new Medicines after you have completely understood and accpet all the possible adverse reactions/side effects.   Do not drive when taking Pain medications or sleeping medications (Benzodaizepines)  Do not take more than prescribed Pain, Sleep and Anxiety Medications. It is  not advisable to combine anxiety,sleep and pain medications without talking with your primary care practitioner  Special Instructions: If you have smoked or chewed Tobacco  in the last 2 yrs please stop smoking, stop any regular Alcohol  and or any Recreational drug use.  Wear Seat belts while driving.  Please note: You were cared for by a hospitalist during your hospital stay. Once you are discharged, your primary care physician will handle any further medical issues. Please note that NO REFILLS for any discharge medications will be authorized once you are discharged, as it is imperative that you return to your primary care physician (or establish a relationship with a primary care physician if you do not have one) for your post hospital discharge needs so that they can reassess your need for medications and monitor your lab values.   21 days of isolation from 04/21/2020   Increase activity slowly   Complete by: As directed      Allergies as of  04/28/2020   No Known Allergies     Medication List    STOP taking these medications   aspirin-acetaminophen-caffeine 250-250-65 MG tablet Commonly known as: EXCEDRIN MIGRAINE   diltiazem 240 MG 24 hr capsule Commonly known as: DILACOR XR   lisinopril 10 MG tablet Commonly known as: ZESTRIL     TAKE these medications   amLODipine 5 MG tablet Commonly known as: NORVASC Take 1 tablet (5 mg total) by mouth daily. Start taking on: April 29, 2020   apixaban 5 MG Tabs tablet Commonly known as: ELIQUIS Take 2 tablets (10 mg total) by mouth 2 (two) times daily for 4 doses.   apixaban 5 MG Tabs tablet Commonly known as: ELIQUIS Take 1 tablet (5 mg total) by mouth 2 (two) times daily. Start 11/17 evening. Start taking on: April 30, 2020   atorvastatin 40 MG tablet Commonly known as: LIPITOR Take 1 tablet (40 mg total) by mouth daily. Start taking on: April 29, 2020   CALCIUM 600-D PO Take 1 tablet by mouth daily.   cholecalciferol 25 MCG (1000 UNIT) tablet Commonly known as: VITAMIN D Take 1,000 Units by mouth daily.   Fish Oil Triple Strength 1400 MG Caps Take 1,400 mg by mouth daily.   metoprolol tartrate 25 MG tablet Commonly known as: LOPRESSOR Take 2 tablets (50 mg total) by mouth 2 (two) times daily. What changed:   medication strength  how much to take  additional instructions   pantoprazole 40 MG tablet Commonly known as: PROTONIX Take 1 tablet (40 mg total) by mouth daily. Start taking on: April 29, 2020   predniSONE 10 MG tablet Commonly known as: DELTASONE Take 40 mg daily for 1 day, 30 mg daily for 1 day, 20 mg daily for 1 days,10 mg daily for 1 day, then stop       Contact information for follow-up providers    [facility]. Schedule an appointment as soon as possible for a visit in 4 week(s).   Contact information: [address]     [address] [place] [address] [phone]            Contact information for after-discharge care    Destination    [facility] .   Service: Skilled Nursing Contact information: [address] [place] [address] [phone]                 No Known Allergies    Other Procedures/Studies: DG Abd 1 View  Result Date: 04/22/2020 CLINICAL DATA:  MRI clearance.  Altered mental status. EXAM: ABDOMEN - 1 VIEW COMPARISON:  None. FINDINGS: The bowel gas pattern is normal. No radio-opaque calculi or other significant radiographic abnormality are seen. There appear to be hazy airspace opacities involving the lungs bilaterally. No metallic foreign body identified. There are degenerative changes of both hips, right worse than left. There are degenerative changes of the lumbar spine. IMPRESSION: 1. No metallic foreign body identified. 2. Hazy airspace opacities involving the lungs bilaterally. 3. Nonobstructive bowel gas pattern. Electronically Signed   By: Katherine Mantle M.D.   On: 04/22/2020 00:23   CT Head Wo Contrast  Result Date: 04/21/2020 CLINICAL DATA:  79 year old female with intermittent dysarthria and left side weakness x1 week, progressive since 1400 hours on 04/20/2020. Hypoxia on initial presentation. EXAM: CT HEAD WITHOUT CONTRAST TECHNIQUE: Contiguous axial images were obtained from the base of the skull through the vertex without intravenous contrast. COMPARISON:  None. FINDINGS: Brain: Small chronic infarct in the left cerebellum (series 3, image 8). No acute intracranial hemorrhage identified. No midline shift, mass effect, or evidence of intracranial mass lesion. No ventriculomegaly. Mild for age scattered bilateral white matter hypodensity. Small age indeterminate hypodense area in the posterior right insula on series 3, image 15. No other no acute or subacute cortically based infarct identified. No supratentorial cortical encephalomalacia identified. Deep gray matter nuclei and brainstem appear  within normal limits. Vascular: Calcified atherosclerosis at the skull base. No suspicious intracranial vascular hyperdensity. Skull: No acute osseous abnormality identified. Sinuses/Orbits: Small fluid level in the left maxillary sinus. Bubbly opacity there as well as in the right maxillary and sphenoid sinuses. Bubbly opacity in the posterior left ethmoid. Tympanic cavities and mastoids appear clear. Other: No acute orbit or scalp soft tissue finding identified. IMPRESSION: 1. Small infarct at the posterior right insula, age indeterminate but suspicious for subacute ischemia in this setting. No associated hemorrhage or mass effect. 2. Elsewhere mild for age cerebral white matter changes, and a small chronic left cerebellar infarct. 3. Acute paranasal sinus inflammation. Electronically Signed   By: Odessa Fleming M.D.   On: 04/21/2020 22:56   MR ANGIO HEAD WO CONTRAST  Result Date: 04/22/2020 CLINICAL DATA:  Follow-up examination for acute stroke. EXAM: MRI HEAD WITHOUT CONTRAST MRA HEAD WITHOUT CONTRAST TECHNIQUE: Multiplanar, multiecho pulse sequences of the brain and surrounding structures were obtained without intravenous contrast. Angiographic images of the head were obtained using MRA technique without contrast. COMPARISON:  Prior CT from 04/21/2020. FINDINGS: MRI HEAD FINDINGS Brain: Examination degraded by motion artifact. Generalized age-related cerebral atrophy. Mild chronic microvascular ischemic disease present within the periventricular and deep white matter both cerebral hemispheres. Small remote left cerebellar infarct noted. Patchy restricted diffusion seen involving the posterior right insula as well as the overlying right posterior frontal and parietal lobes, consistent with right MCA territory infarcts. No significant mass effect or associated hemorrhage. An embolic etiology is suspected given distribution. No other diffusion abnormality to suggest acute or subacute ischemia. Gray-white matter  differentiation otherwise maintained. No other areas of encephalomalacia to suggest chronic cortical infarction on this motion degraded exam. No other definite foci of susceptibility artifact to suggest acute or chronic intracranial hemorrhage. No mass lesion, midline shift or mass effect. No hydrocephalus or extra-axial fluid collection. No made of a partially empty sella. Midline structures intact. Vascular: Major intracranial vascular flow voids are maintained. Skull and upper cervical spine: Craniocervical junction within normal limits. Multilevel cervical spondylosis noted within the  upper cervical spine without high-grade stenosis. Bone marrow signal intensity normal. No scalp soft tissue abnormality. Sinuses/Orbits: Globes and orbital soft tissues within normal limits. Scattered mucosal thickening noted throughout the ethmoidal air cells and maxillary sinuses. Superimposed air-fluid level noted within the left maxillary sinus. Trace bilateral mastoid effusions. Visualized nasopharynx within normal limits. Inner ear structures grossly normal. Other: None. MRA HEAD FINDINGS ANTERIOR CIRCULATION: Examination somewhat degraded by motion artifact. Visualized distal cervical segments of the internal carotid arteries are widely patent with antegrade flow. Petrous, cavernous, and supraclinoid ICAs demonstrate probable atheromatous irregularity without appreciable hemodynamically significant stenosis. A1 segments patent bilaterally. Normal anterior communicating artery complex. Anterior cerebral arteries patent to their distal aspects without stenosis. No M1 stenosis or occlusion. Normal MCA bifurcations. Distal MCA branches well perfused and fairly symmetric. POSTERIOR CIRCULATION: Vertebral arteries patent to the vertebrobasilar junction without stenosis. Both PICAs patent proximally. Basilar patent to its distal aspect without stenosis. Superior cerebral arteries patent bilaterally. Right PCA supplied via a  hypoplastic right P1 segment and prominent right posterior communicating artery. Fetal type origin left PCA. Both PCAs perfused to their distal aspects without proximal flow-limiting stenosis. No aneurysm. IMPRESSION: MRI HEAD IMPRESSION: 1. 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 An embolic etiology is suspected. No significant mass effect. 2. Underlying age-related cerebral atrophy with mild chronic small vessel ischemic disease. Small remote left cerebellar infarct. MRA HEAD IMPRESSION: 1. Technically limited exam due to motion artifact. 2. Negative intracranial MRA for large vessel occlusion. No hemodynamically significant or correctable stenosis. 3. Mild atheromatous irregularity about the carotid siphons without significant stenosis. Electronically Signed   By: Rise Mu M.D.   On: 04/22/2020 01:29   MR BRAIN WO CONTRAST  Result Date: 04/22/2020 CLINICAL DATA:  Follow-up examination for acute stroke. EXAM: MRI HEAD WITHOUT CONTRAST MRA HEAD WITHOUT CONTRAST TECHNIQUE: Multiplanar, multiecho pulse sequences of the brain and surrounding structures were obtained without intravenous contrast. Angiographic images of the head were obtained using MRA technique without contrast. COMPARISON:  Prior CT from 04/21/2020. FINDINGS: MRI HEAD FINDINGS Brain: Examination degraded by motion artifact. Generalized age-related cerebral atrophy. Mild chronic microvascular ischemic disease present within the periventricular and deep white matter both cerebral hemispheres. Small remote left cerebellar infarct noted. Patchy restricted diffusion seen involving the posterior right insula as well as the overlying right posterior frontal and parietal lobes, consistent with right MCA territory infarcts. No significant mass effect or associated hemorrhage. An embolic etiology is suspected given distribution.  No other diffusion abnormality to suggest acute or subacute ischemia. Gray-white matter differentiation otherwise maintained. No other areas of encephalomalacia to suggest chronic cortical infarction on this motion degraded exam. No other definite foci of susceptibility artifact to suggest acute or chronic intracranial hemorrhage. No mass lesion, midline shift or mass effect. No hydrocephalus or extra-axial fluid collection. No made of a partially empty sella. Midline structures intact. Vascular: Major intracranial vascular flow voids are maintained. Skull and upper cervical spine: Craniocervical junction within normal limits. Multilevel cervical spondylosis noted within the upper cervical spine without high-grade stenosis. Bone marrow signal intensity normal. No scalp soft tissue abnormality. Sinuses/Orbits: Globes and orbital soft tissues within normal limits. Scattered mucosal thickening noted throughout the ethmoidal air cells and maxillary sinuses. Superimposed air-fluid level noted within the left maxillary sinus. Trace bilateral mastoid effusions. Visualized nasopharynx within normal limits. Inner ear structures grossly normal. Other: None. MRA HEAD FINDINGS ANTERIOR CIRCULATION: Examination somewhat degraded  by motion artifact. Visualized distal cervical segments of the internal carotid arteries are widely patent with antegrade flow. Petrous, cavernous, and supraclinoid ICAs demonstrate probable atheromatous irregularity without appreciable hemodynamically significant stenosis. A1 segments patent bilaterally. Normal anterior communicating artery complex. Anterior cerebral arteries patent to their distal aspects without stenosis. No M1 stenosis or occlusion. Normal MCA bifurcations. Distal MCA branches well perfused and fairly symmetric. POSTERIOR CIRCULATION: Vertebral arteries patent to the vertebrobasilar junction without stenosis. Both PICAs patent proximally. Basilar patent to its distal aspect without  stenosis. Superior cerebral arteries patent bilaterally. Right PCA supplied via a hypoplastic right P1 segment and prominent right posterior communicating artery. Fetal type origin left PCA. Both PCAs perfused to their distal aspects without proximal flow-limiting stenosis. No aneurysm. IMPRESSION: MRI HEAD IMPRESSION: 1. 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 An embolic etiology is suspected. No significant mass effect. 2. Underlying age-related cerebral atrophy with mild chronic small vessel ischemic disease. Small remote left cerebellar infarct. MRA HEAD IMPRESSION: 1. Technically limited exam due to motion artifact. 2. Negative intracranial MRA for large vessel occlusion. No hemodynamically significant or correctable stenosis. 3. Mild atheromatous irregularity about the carotid siphons without significant stenosis. Electronically Signed   By: Rise Mu M.D.   On: 04/22/2020 01:29   DG Chest Portable 1 View  Result Date: 04/21/2020 CLINICAL DATA:  Hypoxia EXAM: PORTABLE CHEST 1 VIEW COMPARISON:  None. FINDINGS: The heart size is enlarged. There are hazy bilateral airspace opacities. There is no pneumothorax. No large pleural effusion. There are end-stage degenerative changes of both glenohumeral joints. IMPRESSION: Cardiomegaly with hazy bilateral airspace opacities concerning for pulmonary edema or an atypical infectious process. Electronically Signed   By: Katherine Mantle M.D.   On: 04/21/2020 20:21   ECHOCARDIOGRAM COMPLETE  Result Date: 04/23/2020    ECHOCARDIOGRAM REPORT   Patient Name:   Beverly Howell Date of Exam: 04/23/2020 Medical Rec #:  [id number]     Height:       65.0 in Accession #:    [id number]    Weight:       183.4 lb Date of Birth:  07-07-1940      BSA:          1.907 m Patient Age:    79 years      BP:           151/94 mmHg Patient Gender: F              HR:           79 bpm. Exam Location:  Inpatient Procedure: 2D Echo, Cardiac Doppler and Color Doppler Indications:    Stroke 434.91 / I163.9  History:        Patient has no prior history of Echocardiogram examinations.                 Risk Factors:Hypertension and Dyslipidemia. COVID-19 Positive.  Sonographer:    Elmarie Shiley Dance Referring Phys: [id number] ARSHAD N KAKRAKANDY IMPRESSIONS  1. Left ventricular ejection fraction, by estimation, is 60 to 65%. The left ventricle has normal function. The left ventricle has no regional wall motion abnormalities. Left ventricular diastolic parameters are consistent with Grade I diastolic dysfunction (impaired relaxation).  2. Right ventricular systolic function is normal. The right ventricular size is normal. There is normal pulmonary artery systolic pressure. The estimated right ventricular systolic pressure is 27.4 mmHg.  3. The mitral valve is normal in  structure. Trivial mitral valve regurgitation. No evidence of mitral stenosis.  4. The aortic valve is normal in structure. There is moderate calcification of the aortic valve. There is moderate thickening of the aortic valve. Aortic valve regurgitation is trivial. Mild to moderate aortic valve sclerosis/calcification is present, without any evidence of aortic stenosis. Aortic valve mean gradient measures 8.5 mmHg. Aortic valve Vmax measures 2.13 m/s.  5. The inferior vena cava is normal in size with greater than 50% respiratory variability, suggesting right atrial pressure of 3 mmHg. Conclusion(s)/Recommendation(s): No intracardiac source of embolism detected on this transthoracic study. A transesophageal echocardiogram is recommended to exclude cardiac source of embolism if clinically indicated. FINDINGS  Left Ventricle: Left ventricular ejection fraction, by estimation, is 60 to 65%. The left ventricle has normal function. The left ventricle has no regional wall motion abnormalities. The left ventricular internal cavity size  was normal in size. There is  no left ventricular hypertrophy. Left ventricular diastolic parameters are consistent with Grade I diastolic dysfunction (impaired relaxation). Right Ventricle: The right ventricular size is normal. No increase in right ventricular wall thickness. Right ventricular systolic function is normal. There is normal pulmonary artery systolic pressure. The tricuspid regurgitant velocity is 2.47 m/s, and  with an assumed right atrial pressure of 3 mmHg, the estimated right ventricular systolic pressure is 27.4 mmHg. Left Atrium: Left atrial size was normal in size. Right Atrium: Right atrial size was normal in size. Pericardium: There is no evidence of pericardial effusion. Mitral Valve: The mitral valve is normal in structure. Trivial mitral valve regurgitation. No evidence of mitral valve stenosis. Tricuspid Valve: The tricuspid valve is normal in structure. Tricuspid valve regurgitation is mild . No evidence of tricuspid stenosis. Aortic Valve: The aortic valve is normal in structure. There is moderate calcification of the aortic valve. There is moderate thickening of the aortic valve. Aortic valve regurgitation is trivial. Mild to moderate aortic valve sclerosis/calcification is present, without any evidence of aortic stenosis. Aortic valve mean gradient measures 8.5 mmHg. Aortic valve peak gradient measures 18.2 mmHg. Aortic valve area, by VTI measures 1.58 cm. Pulmonic Valve: The pulmonic valve was normal in structure. Pulmonic valve regurgitation is not visualized. No evidence of pulmonic stenosis. Aorta: The aortic root is normal in size and structure. Venous: The inferior vena cava is normal in size with greater than 50% respiratory variability, suggesting right atrial pressure of 3 mmHg. IAS/Shunts: No atrial level shunt detected by color flow Doppler.  LEFT VENTRICLE PLAX 2D LVIDd:         3.90 cm  Diastology LVIDs:         2.72 cm  LV e' medial:    4.35 cm/s LV PW:         0.92 cm   LV E/e' medial:  14.5 LV IVS:        1.15 cm  LV e' lateral:   6.42 cm/s LVOT diam:     2.00 cm  LV E/e' lateral: 9.8 LV SV:         63 LV SV Index:   33 LVOT Area:     3.14 cm  RIGHT VENTRICLE             IVC RV Basal diam:  2.65 cm     IVC diam: 1.81 cm RV S prime:     12.00 cm/s TAPSE (M-mode): 2.0 cm LEFT ATRIUM             Index  RIGHT ATRIUM          Index LA diam:        3.20 cm 1.68 cm/m  RA Area:     9.10 cm LA Vol (A2C):   56.4 ml 29.58 ml/m RA Volume:   14.70 ml 7.71 ml/m LA Vol (A4C):   59.4 ml 31.15 ml/m LA Biplane Vol: 60.1 ml 31.52 ml/m  AORTIC VALVE AV Area (Vmax):    1.49 cm AV Area (Vmean):   1.39 cm AV Area (VTI):     1.58 cm AV Vmax:           213.50 cm/s AV Vmean:          135.000 cm/s AV VTI:            0.395 m AV Peak Grad:      18.2 mmHg AV Mean Grad:      8.5 mmHg LVOT Vmax:         101.40 cm/s LVOT Vmean:        59.550 cm/s LVOT VTI:          0.199 m LVOT/AV VTI ratio: 0.50  AORTA Ao Root diam: 2.80 cm Ao Asc diam:  3.40 cm MITRAL VALVE                TRICUSPID VALVE MV Area (PHT): 2.45 cm     TR Peak grad:   24.4 mmHg MV Decel Time: 310 msec     TR Vmax:        247.00 cm/s MV E velocity: 62.90 cm/s MV A velocity: 103.00 cm/s  SHUNTS MV E/A ratio:  0.61         Systemic VTI:  0.20 m                             Systemic Diam: 2.00 cm Donato Schultz MD Electronically signed by Donato Schultz MD Signature Date/Time: 04/23/2020/2:48:24 PM    Final    VAS US CAROTID (at [facility] and [facility] only)  Result Date: 04/22/2020 Carotid Arterial Duplex Study Indications:       CVA and DVT positive. Risk Factors:      Hypertension. Other Factors:     COVID 19 positive. Comparison Study:  No prior studies. Performing Technologist: Chanda Busing RVT  Examination Guidelines: A complete evaluation includes B-mode imaging, spectral Doppler, color Doppler, and power Doppler as needed of all accessible portions of each vessel. Bilateral testing is considered an integral part of a complete examination.  Limited examinations for reoccurring indications may be performed as noted.  Right Carotid Findings: +----------+--------+--------+--------+-----------------------+--------+           PSV cm/sEDV cm/sStenosisPlaque Description     Comments +----------+--------+--------+--------+-----------------------+--------+ CCA Prox  56      5               smooth and heterogenous         +----------+--------+--------+--------+-----------------------+--------+ CCA Distal49      11              smooth and heterogenous         +----------+--------+--------+--------+-----------------------+--------+ ICA Prox  48      14              smooth and heterogenous         +----------+--------+--------+--------+-----------------------+--------+ ICA Distal87      24  tortuous +----------+--------+--------+--------+-----------------------+--------+ ECA       59      6                                               +----------+--------+--------+--------+-----------------------+--------+ +----------+--------+-------+--------+-------------------+           PSV cm/sEDV cmsDescribeArm Pressure (mmHG) +----------+--------+-------+--------+-------------------+ [id number]                                         +----------+--------+-------+--------+-------------------+ +---------+--------+--+--------+--+---------+ VertebralPSV cm/s59EDV cm/s13Antegrade +---------+--------+--+--------+--+---------+  Left Carotid Findings: +----------+--------+--------+--------+-----------------------+--------+           PSV cm/sEDV cm/sStenosisPlaque Description     Comments +----------+--------+--------+--------+-----------------------+--------+ CCA Prox  97      19              smooth and heterogenous         +----------+--------+--------+--------+-----------------------+--------+ CCA Distal49      11              smooth and heterogenous          +----------+--------+--------+--------+-----------------------+--------+ ICA Prox  37      11              smooth and heterogenous         +----------+--------+--------+--------+-----------------------+--------+ ICA Distal59      22                                     tortuous +----------+--------+--------+--------+-----------------------+--------+ ECA       44      5                                               +----------+--------+--------+--------+-----------------------+--------+ +----------+--------+--------+--------+-------------------+           PSV cm/sEDV cm/sDescribeArm Pressure (mmHG) +----------+--------+--------+--------+-------------------+ [id number]                                          +----------+--------+--------+--------+-------------------+ +---------+--------+--+--------+--+---------+ VertebralPSV cm/s33EDV cm/s10Antegrade +---------+--------+--+--------+--+---------+   Summary: Right Carotid: Velocities in the right ICA are consistent with a 1-39% stenosis. Left Carotid: Velocities in the left ICA are consistent with a 1-39% stenosis. Vertebrals: Bilateral vertebral arteries demonstrate antegrade flow. *See table(s) above for measurements and observations.  Electronically signed by Delia Heady MD on 04/22/2020 at 1:17:39 PM.    Final    VAS [place] LOWER EXTREMITY VENOUS (DVT)  Result Date: 04/22/2020  Lower Venous DVT Study Indications: Elevated Ddimer.  Risk Factors: COVID 19 positive. Comparison Study: No prior studies. Performing Technologist: Chanda Busing RVT  Examination Guidelines: A complete evaluation includes B-mode imaging, spectral Doppler, color Doppler, and power Doppler as needed of all accessible portions of each vessel. Bilateral testing is considered an integral part of a complete examination. Limited examinations for reoccurring indications may be performed as noted. The reflux portion of the exam is performed with the patient  in reverse Trendelenburg.  +---------+---------------+---------+-----------+----------+-----------------+ RIGHT    CompressibilityPhasicitySpontaneityPropertiesThrombus Aging    +---------+---------------+---------+-----------+----------+-----------------+ CFV  Full           Yes      Yes                                    +---------+---------------+---------+-----------+----------+-----------------+ SFJ      Full                                                           +---------+---------------+---------+-----------+----------+-----------------+ FV Prox  Full                                                           +---------+---------------+---------+-----------+----------+-----------------+ FV Mid   Partial        No       No                   Age Indeterminate +---------+---------------+---------+-----------+----------+-----------------+ FV DistalNone           No       No                   Age Indeterminate +---------+---------------+---------+-----------+----------+-----------------+ PFV      Full                                                           +---------+---------------+---------+-----------+----------+-----------------+ POP      None           No       No                   Age Indeterminate +---------+---------------+---------+-----------+----------+-----------------+ PTV      None                                         Age Indeterminate +---------+---------------+---------+-----------+----------+-----------------+ PERO     None                                         Age Indeterminate +---------+---------------+---------+-----------+----------+-----------------+ Gastroc  None                                         Age Indeterminate +---------+---------------+---------+-----------+----------+-----------------+   +---------+---------------+---------+-----------+----------+--------------+ LEFT      CompressibilityPhasicitySpontaneityPropertiesThrombus Aging +---------+---------------+---------+-----------+----------+--------------+ CFV      Full           Yes      Yes                                 +---------+---------------+---------+-----------+----------+--------------+ SFJ      Full                                                        +---------+---------------+---------+-----------+----------+--------------+  FV Prox  Full                                                        +---------+---------------+---------+-----------+----------+--------------+ FV Mid   Full                                                        +---------+---------------+---------+-----------+----------+--------------+ FV DistalFull                                                        +---------+---------------+---------+-----------+----------+--------------+ PFV      Full                                                        +---------+---------------+---------+-----------+----------+--------------+ POP      None           No       No                   Acute          +---------+---------------+---------+-----------+----------+--------------+ PTV      None                                         Acute          +---------+---------------+---------+-----------+----------+--------------+ PERO     None                                         Acute          +---------+---------------+---------+-----------+----------+--------------+    Summary: RIGHT: - Findings consistent with age indeterminate deep vein thrombosis involving the right femoral vein, right popliteal vein, right posterior tibial veins, right peroneal veins, and right gastrocnemius veins. - No cystic structure found in the popliteal fossa.  LEFT: - Findings consistent with acute deep vein thrombosis involving the left popliteal vein, left posterior tibial veins, and left peroneal veins. - No cystic  structure found in the popliteal fossa.  *See table(s) above for measurements and observations. Electronically signed by Gretta Began MD on 04/22/2020 at 2:50:20 PM.    Final      TODAY-DAY OF DISCHARGE:  Subjective:   Beverly Howell today has no headache,no chest abdominal pain,no new weakness tingling or numbness, feels much better wants to go home today.   Objective:   Blood pressure (!) 154/84, pulse 79, temperature 97.8 F (36.6 C), temperature source Oral, resp. rate (!) 26, height  (1.651 m), weight 98.2 kg, SpO2 95 %.  Intake/Output Summary (Last 24 hours) at 04/28/2020 1109 Last data filed at 04/28/2020 0837 Gross per 24 hour  Intake 660 ml  Output --  Net 660 ml   Filed Weights   04/21/20 1836 04/22/20 1621 04/27/20 0400  Weight: 79.8 kg 83.2 kg 98.2 kg    Exam: Awake Alert, Oriented *3, No new F.N deficits, Normal affect [place].AT,PERRAL Supple Neck,No JVD, No cervical lymphadenopathy appriciated.  Symmetrical Chest wall movement, Good air movement bilaterally, CTAB RRR,No Gallops,Rubs or new Murmurs, No Parasternal Heave +ve B.Sounds, Abd Soft, Non tender, No organomegaly appriciated, No rebound -guarding or rigidity. No Cyanosis, Clubbing or edema, No new Rash or bruise   PERTINENT RADIOLOGIC STUDIES: DG Abd 1 View  Result Date: 04/22/2020 CLINICAL DATA:  MRI clearance.  Altered mental status. EXAM: ABDOMEN - 1 VIEW COMPARISON:  None. FINDINGS: The bowel gas pattern is normal. No radio-opaque calculi or other significant radiographic abnormality are seen. There appear to be hazy airspace opacities involving the lungs bilaterally. No metallic foreign body identified. There are degenerative changes of both hips, right worse than left. There are degenerative changes of the lumbar spine. IMPRESSION: 1. No metallic foreign body identified. 2. Hazy airspace opacities involving the lungs bilaterally. 3. Nonobstructive bowel gas pattern. Electronically Signed   By:  Katherine Mantle M.D.   On: 04/22/2020 00:23   CT Head Wo Contrast  Result Date: 04/21/2020 CLINICAL DATA:  79 year old female with intermittent dysarthria and left side weakness x1 week, progressive since 1400 hours on 04/20/2020. Hypoxia on initial presentation. EXAM: CT HEAD WITHOUT CONTRAST TECHNIQUE: Contiguous axial images were obtained from the base of the skull through the vertex without intravenous contrast. COMPARISON:  None. FINDINGS: Brain: Small chronic infarct in the left cerebellum (series 3, image 8). No acute intracranial hemorrhage identified. No midline shift, mass effect, or evidence of intracranial mass lesion. No ventriculomegaly. Mild for age scattered bilateral white matter hypodensity. Small age indeterminate hypodense area in the posterior right insula on series 3, image 15. No other no acute or subacute cortically based infarct identified. No supratentorial cortical encephalomalacia identified. Deep gray matter nuclei and brainstem appear within normal limits. Vascular: Calcified atherosclerosis at the skull base. No suspicious intracranial vascular hyperdensity. Skull: No acute osseous abnormality identified. Sinuses/Orbits: Small fluid level in the left maxillary sinus. Bubbly opacity there as well as in the right maxillary and sphenoid sinuses. Bubbly opacity in the posterior left ethmoid. Tympanic cavities and mastoids appear clear. Other: No acute orbit or scalp soft tissue finding identified. IMPRESSION: 1. Small infarct at the posterior right insula, age indeterminate but suspicious for subacute ischemia in this setting. No associated hemorrhage or mass effect. 2. Elsewhere mild for age cerebral white matter changes, and a small chronic left cerebellar infarct. 3. Acute paranasal sinus inflammation. Electronically Signed   By: Odessa Fleming M.D.   On: 04/21/2020 22:56   MR ANGIO HEAD WO CONTRAST  Result Date: 04/22/2020 CLINICAL DATA:  Follow-up examination for acute stroke.  EXAM: MRI HEAD WITHOUT CONTRAST MRA HEAD WITHOUT CONTRAST TECHNIQUE: Multiplanar, multiecho pulse sequences of the brain and surrounding structures were obtained without intravenous contrast. Angiographic images of the head were obtained using MRA technique without contrast. COMPARISON:  Prior CT from 04/21/2020. FINDINGS: MRI HEAD FINDINGS Brain: Examination degraded by motion artifact. Generalized age-related cerebral atrophy. Mild chronic microvascular ischemic disease present within the periventricular and deep white matter both cerebral hemispheres. Small remote left cerebellar infarct noted. Patchy restricted diffusion seen involving the posterior right insula as well as the overlying right posterior frontal and parietal lobes, consistent with right MCA territory infarcts. No significant mass effect or associated  hemorrhage. An embolic etiology is suspected given distribution. No other diffusion abnormality to suggest acute or subacute ischemia. Gray-white matter differentiation otherwise maintained. No other areas of encephalomalacia to suggest chronic cortical infarction on this motion degraded exam. No other definite foci of susceptibility artifact to suggest acute or chronic intracranial hemorrhage. No mass lesion, midline shift or mass effect. No hydrocephalus or extra-axial fluid collection. No made of a partially empty sella. Midline structures intact. Vascular: Major intracranial vascular flow voids are maintained. Skull and upper cervical spine: Craniocervical junction within normal limits. Multilevel cervical spondylosis noted within the upper cervical spine without high-grade stenosis. Bone marrow signal intensity normal. No scalp soft tissue abnormality. Sinuses/Orbits: Globes and orbital soft tissues within normal limits. Scattered mucosal thickening noted throughout the ethmoidal air cells and maxillary sinuses. Superimposed air-fluid level noted within the left maxillary sinus. Trace bilateral  mastoid effusions. Visualized nasopharynx within normal limits. Inner ear structures grossly normal. Other: None. MRA HEAD FINDINGS ANTERIOR CIRCULATION: Examination somewhat degraded by motion artifact. Visualized distal cervical segments of the internal carotid arteries are widely patent with antegrade flow. Petrous, cavernous, and supraclinoid ICAs demonstrate probable atheromatous irregularity without appreciable hemodynamically significant stenosis. A1 segments patent bilaterally. Normal anterior communicating artery complex. Anterior cerebral arteries patent to their distal aspects without stenosis. No M1 stenosis or occlusion. Normal MCA bifurcations. Distal MCA branches well perfused and fairly symmetric. POSTERIOR CIRCULATION: Vertebral arteries patent to the vertebrobasilar junction without stenosis. Both PICAs patent proximally. Basilar patent to its distal aspect without stenosis. Superior cerebral arteries patent bilaterally. Right PCA supplied via a hypoplastic right P1 segment and prominent right posterior communicating artery. Fetal type origin left PCA. Both PCAs perfused to their distal aspects without proximal flow-limiting stenosis. No aneurysm. IMPRESSION: MRI HEAD IMPRESSION: 1. 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 An embolic etiology is suspected. No significant mass effect. 2. Underlying age-related cerebral atrophy with mild chronic small vessel ischemic disease. Small remote left cerebellar infarct. MRA HEAD IMPRESSION: 1. Technically limited exam due to motion artifact. 2. Negative intracranial MRA for large vessel occlusion. No hemodynamically significant or correctable stenosis. 3. Mild atheromatous irregularity about the carotid siphons without significant stenosis. Electronically Signed   By: Rise MuBenjamin  McClintock M.D.   On: 04/22/2020 01:29   MR BRAIN WO  CONTRAST  Result Date: 04/22/2020 CLINICAL DATA:  Follow-up examination for acute stroke. EXAM: MRI HEAD WITHOUT CONTRAST MRA HEAD WITHOUT CONTRAST TECHNIQUE: Multiplanar, multiecho pulse sequences of the brain and surrounding structures were obtained without intravenous contrast. Angiographic images of the head were obtained using MRA technique without contrast. COMPARISON:  Prior CT from 04/21/2020. FINDINGS: MRI HEAD FINDINGS Brain: Examination degraded by motion artifact. Generalized age-related cerebral atrophy. Mild chronic microvascular ischemic disease present within the periventricular and deep white matter both cerebral hemispheres. Small remote left cerebellar infarct noted. Patchy restricted diffusion seen involving the posterior right insula as well as the overlying right posterior frontal and parietal lobes, consistent with right MCA territory infarcts. No significant mass effect or associated hemorrhage. An embolic etiology is suspected given distribution. No other diffusion abnormality to suggest acute or subacute ischemia. Gray-white matter differentiation otherwise maintained. No other areas of encephalomalacia to suggest chronic cortical infarction on this motion degraded exam. No other definite foci of susceptibility artifact to suggest acute or chronic intracranial hemorrhage. No mass lesion, midline shift or mass effect. No hydrocephalus or extra-axial fluid collection. No made of a  partially empty sella. Midline structures intact. Vascular: Major intracranial vascular flow voids are maintained. Skull and upper cervical spine: Craniocervical junction within normal limits. Multilevel cervical spondylosis noted within the upper cervical spine without high-grade stenosis. Bone marrow signal intensity normal. No scalp soft tissue abnormality. Sinuses/Orbits: Globes and orbital soft tissues within normal limits. Scattered mucosal thickening noted throughout the ethmoidal air cells and maxillary  sinuses. Superimposed air-fluid level noted within the left maxillary sinus. Trace bilateral mastoid effusions. Visualized nasopharynx within normal limits. Inner ear structures grossly normal. Other: None. MRA HEAD FINDINGS ANTERIOR CIRCULATION: Examination somewhat degraded by motion artifact. Visualized distal cervical segments of the internal carotid arteries are widely patent with antegrade flow. Petrous, cavernous, and supraclinoid ICAs demonstrate probable atheromatous irregularity without appreciable hemodynamically significant stenosis. A1 segments patent bilaterally. Normal anterior communicating artery complex. Anterior cerebral arteries patent to their distal aspects without stenosis. No M1 stenosis or occlusion. Normal MCA bifurcations. Distal MCA branches well perfused and fairly symmetric. POSTERIOR CIRCULATION: Vertebral arteries patent to the vertebrobasilar junction without stenosis. Both PICAs patent proximally. Basilar patent to its distal aspect without stenosis. Superior cerebral arteries patent bilaterally. Right PCA supplied via a hypoplastic right P1 segment and prominent right posterior communicating artery. Fetal type origin left PCA. Both PCAs perfused to their distal aspects without proximal flow-limiting stenosis. No aneurysm. IMPRESSION: MRI HEAD IMPRESSION: 1. 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 An embolic etiology is suspected. No significant mass effect. 2. Underlying age-related cerebral atrophy with mild chronic small vessel ischemic disease. Small remote left cerebellar infarct. MRA HEAD IMPRESSION: 1. Technically limited exam due to motion artifact. 2. Negative intracranial MRA for large vessel occlusion. No hemodynamically significant or correctable stenosis. 3. Mild atheromatous irregularity about the carotid siphons without significant stenosis.  Electronically Signed   By: Rise Mu M.D.   On: 04/22/2020 01:29   DG Chest Portable 1 View  Result Date: 04/21/2020 CLINICAL DATA:  Hypoxia EXAM: PORTABLE CHEST 1 VIEW COMPARISON:  None. FINDINGS: The heart size is enlarged. There are hazy bilateral airspace opacities. There is no pneumothorax. No large pleural effusion. There are end-stage degenerative changes of both glenohumeral joints. IMPRESSION: Cardiomegaly with hazy bilateral airspace opacities concerning for pulmonary edema or an atypical infectious process. Electronically Signed   By: Katherine Mantle M.D.   On: 04/21/2020 20:21   ECHOCARDIOGRAM COMPLETE  Result Date: 04/23/2020    ECHOCARDIOGRAM REPORT   Patient Name:   Beverly Howell Date of Exam: 04/23/2020 Medical Rec #:  [id number]     Height:       65.0 in Accession #:    [id number]    Weight:       183.4 lb Date of Birth:  1941/02/02      BSA:          1.907 m Patient Age:    79 years      BP:           151/94 mmHg Patient Gender: F             HR:           79 bpm. Exam Location:  Inpatient Procedure: 2D Echo, Cardiac Doppler and Color Doppler Indications:    Stroke 434.91 / I163.9  History:        Patient has no prior history of Echocardiogram examinations.  Risk Factors:Hypertension and Dyslipidemia. COVID-19 Positive.  Sonographer:    Elmarie Shiley Dance Referring Phys: [id number] ARSHAD N KAKRAKANDY IMPRESSIONS  1. Left ventricular ejection fraction, by estimation, is 60 to 65%. The left ventricle has normal function. The left ventricle has no regional wall motion abnormalities. Left ventricular diastolic parameters are consistent with Grade I diastolic dysfunction (impaired relaxation).  2. Right ventricular systolic function is normal. The right ventricular size is normal. There is normal pulmonary artery systolic pressure. The estimated right ventricular systolic pressure is 27.4 mmHg.  3. The mitral valve is normal in structure. Trivial mitral valve regurgitation. No  evidence of mitral stenosis.  4. The aortic valve is normal in structure. There is moderate calcification of the aortic valve. There is moderate thickening of the aortic valve. Aortic valve regurgitation is trivial. Mild to moderate aortic valve sclerosis/calcification is present, without any evidence of aortic stenosis. Aortic valve mean gradient measures 8.5 mmHg. Aortic valve Vmax measures 2.13 m/s.  5. The inferior vena cava is normal in size with greater than 50% respiratory variability, suggesting right atrial pressure of 3 mmHg. Conclusion(s)/Recommendation(s): No intracardiac source of embolism detected on this transthoracic study. A transesophageal echocardiogram is recommended to exclude cardiac source of embolism if clinically indicated. FINDINGS  Left Ventricle: Left ventricular ejection fraction, by estimation, is 60 to 65%. The left ventricle has normal function. The left ventricle has no regional wall motion abnormalities. The left ventricular internal cavity size was normal in size. There is  no left ventricular hypertrophy. Left ventricular diastolic parameters are consistent with Grade I diastolic dysfunction (impaired relaxation). Right Ventricle: The right ventricular size is normal. No increase in right ventricular wall thickness. Right ventricular systolic function is normal. There is normal pulmonary artery systolic pressure. The tricuspid regurgitant velocity is 2.47 m/s, and  with an assumed right atrial pressure of 3 mmHg, the estimated right ventricular systolic pressure is 27.4 mmHg. Left Atrium: Left atrial size was normal in size. Right Atrium: Right atrial size was normal in size. Pericardium: There is no evidence of pericardial effusion. Mitral Valve: The mitral valve is normal in structure. Trivial mitral valve regurgitation. No evidence of mitral valve stenosis. Tricuspid Valve: The tricuspid valve is normal in structure. Tricuspid valve regurgitation is mild . No evidence of  tricuspid stenosis. Aortic Valve: The aortic valve is normal in structure. There is moderate calcification of the aortic valve. There is moderate thickening of the aortic valve. Aortic valve regurgitation is trivial. Mild to moderate aortic valve sclerosis/calcification is present, without any evidence of aortic stenosis. Aortic valve mean gradient measures 8.5 mmHg. Aortic valve peak gradient measures 18.2 mmHg. Aortic valve area, by VTI measures 1.58 cm. Pulmonic Valve: The pulmonic valve was normal in structure. Pulmonic valve regurgitation is not visualized. No evidence of pulmonic stenosis. Aorta: The aortic root is normal in size and structure. Venous: The inferior vena cava is normal in size with greater than 50% respiratory variability, suggesting right atrial pressure of 3 mmHg. IAS/Shunts: No atrial level shunt detected by color flow Doppler.  LEFT VENTRICLE PLAX 2D LVIDd:         3.90 cm  Diastology LVIDs:         2.72 cm  LV e' medial:    4.35 cm/s LV PW:         0.92 cm  LV E/e' medial:  14.5 LV IVS:        1.15 cm  LV e' lateral:   6.42 cm/s LVOT diam:  2.00 cm  LV E/e' lateral: 9.8 LV SV:         63 LV SV Index:   33 LVOT Area:     3.14 cm  RIGHT VENTRICLE             IVC RV Basal diam:  2.65 cm     IVC diam: 1.81 cm RV S prime:     12.00 cm/s TAPSE (M-mode): 2.0 cm LEFT ATRIUM             Index       RIGHT ATRIUM          Index LA diam:        3.20 cm 1.68 cm/m  RA Area:     9.10 cm LA Vol (A2C):   56.4 ml 29.58 ml/m RA Volume:   14.70 ml 7.71 ml/m LA Vol (A4C):   59.4 ml 31.15 ml/m LA Biplane Vol: 60.1 ml 31.52 ml/m  AORTIC VALVE AV Area (Vmax):    1.49 cm AV Area (Vmean):   1.39 cm AV Area (VTI):     1.58 cm AV Vmax:           213.50 cm/s AV Vmean:          135.000 cm/s AV VTI:            0.395 m AV Peak Grad:      18.2 mmHg AV Mean Grad:      8.5 mmHg LVOT Vmax:         101.40 cm/s LVOT Vmean:        59.550 cm/s LVOT VTI:          0.199 m LVOT/AV VTI ratio: 0.50  AORTA Ao Root diam:  2.80 cm Ao Asc diam:  3.40 cm MITRAL VALVE                TRICUSPID VALVE MV Area (PHT): 2.45 cm     TR Peak grad:   24.4 mmHg MV Decel Time: 310 msec     TR Vmax:        247.00 cm/s MV E velocity: 62.90 cm/s MV A velocity: 103.00 cm/s  SHUNTS MV E/A ratio:  0.61         Systemic VTI:  0.20 m                             Systemic Diam: 2.00 cm Donato Schultz MD Electronically signed by Donato Schultz MD Signature Date/Time: 04/23/2020/2:48:24 PM    Final    VAS US CAROTID (at [facility] and [facility] only)  Result Date: 04/22/2020 Carotid Arterial Duplex Study Indications:       CVA and DVT positive. Risk Factors:      Hypertension. Other Factors:     COVID 19 positive. Comparison Study:  No prior studies. Performing Technologist: Chanda Busing RVT  Examination Guidelines: A complete evaluation includes B-mode imaging, spectral Doppler, color Doppler, and power Doppler as needed of all accessible portions of each vessel. Bilateral testing is considered an integral part of a complete examination. Limited examinations for reoccurring indications may be performed as noted.  Right Carotid Findings: +----------+--------+--------+--------+-----------------------+--------+           PSV cm/sEDV cm/sStenosisPlaque Description     Comments +----------+--------+--------+--------+-----------------------+--------+ CCA Prox  56      5               smooth and heterogenous         +----------+--------+--------+--------+-----------------------+--------+  CCA Distal49      11              smooth and heterogenous         +----------+--------+--------+--------+-----------------------+--------+ ICA Prox  48      14              smooth and heterogenous         +----------+--------+--------+--------+-----------------------+--------+ ICA Distal87      24                                     tortuous +----------+--------+--------+--------+-----------------------+--------+ ECA       59      6                                                +----------+--------+--------+--------+-----------------------+--------+ +----------+--------+-------+--------+-------------------+           PSV cm/sEDV cmsDescribeArm Pressure (mmHG) +----------+--------+-------+--------+-------------------+ [id number]                                         +----------+--------+-------+--------+-------------------+ +---------+--------+--+--------+--+---------+ VertebralPSV cm/s59EDV cm/s13Antegrade +---------+--------+--+--------+--+---------+  Left Carotid Findings: +----------+--------+--------+--------+-----------------------+--------+           PSV cm/sEDV cm/sStenosisPlaque Description     Comments +----------+--------+--------+--------+-----------------------+--------+ CCA Prox  97      19              smooth and heterogenous         +----------+--------+--------+--------+-----------------------+--------+ CCA Distal49      11              smooth and heterogenous         +----------+--------+--------+--------+-----------------------+--------+ ICA Prox  37      11              smooth and heterogenous         +----------+--------+--------+--------+-----------------------+--------+ ICA Distal59      22                                     tortuous +----------+--------+--------+--------+-----------------------+--------+ ECA       44      5                                               +----------+--------+--------+--------+-----------------------+--------+ +----------+--------+--------+--------+-------------------+           PSV cm/sEDV cm/sDescribeArm Pressure (mmHG) +----------+--------+--------+--------+-------------------+ [id number]                                          +----------+--------+--------+--------+-------------------+ +---------+--------+--+--------+--+---------+ VertebralPSV cm/s33EDV cm/s10Antegrade +---------+--------+--+--------+--+---------+    Summary: Right Carotid: Velocities in the right ICA are consistent with a 1-39% stenosis. Left Carotid: Velocities in the left ICA are consistent with a 1-39% stenosis. Vertebrals: Bilateral vertebral arteries demonstrate antegrade flow. *See table(s) above for measurements and observations.  Electronically signed by Delia Heady MD on 04/22/2020 at 1:17:39 PM.    Final  VAS [place] LOWER EXTREMITY VENOUS (DVT)  Result Date: 04/22/2020  Lower Venous DVT Study Indications: Elevated Ddimer.  Risk Factors: COVID 19 positive. Comparison Study: No prior studies. Performing Technologist: Chanda Busing RVT  Examination Guidelines: A complete evaluation includes B-mode imaging, spectral Doppler, color Doppler, and power Doppler as needed of all accessible portions of each vessel. Bilateral testing is considered an integral part of a complete examination. Limited examinations for reoccurring indications may be performed as noted. The reflux portion of the exam is performed with the patient in reverse Trendelenburg.  +---------+---------------+---------+-----------+----------+-----------------+ RIGHT    CompressibilityPhasicitySpontaneityPropertiesThrombus Aging    +---------+---------------+---------+-----------+----------+-----------------+ CFV      Full           Yes      Yes                                    +---------+---------------+---------+-----------+----------+-----------------+ SFJ      Full                                                           +---------+---------------+---------+-----------+----------+-----------------+ FV Prox  Full                                                           +---------+---------------+---------+-----------+----------+-----------------+ FV Mid   Partial        No       No                   Age Indeterminate +---------+---------------+---------+-----------+----------+-----------------+ FV DistalNone           No       No                    Age Indeterminate +---------+---------------+---------+-----------+----------+-----------------+ PFV      Full                                                           +---------+---------------+---------+-----------+----------+-----------------+ POP      None           No       No                   Age Indeterminate +---------+---------------+---------+-----------+----------+-----------------+ PTV      None                                         Age Indeterminate +---------+---------------+---------+-----------+----------+-----------------+ PERO     None                                         Age Indeterminate +---------+---------------+---------+-----------+----------+-----------------+ Gastroc  None  Age Indeterminate +---------+---------------+---------+-----------+----------+-----------------+   +---------+---------------+---------+-----------+----------+--------------+ LEFT     CompressibilityPhasicitySpontaneityPropertiesThrombus Aging +---------+---------------+---------+-----------+----------+--------------+ CFV      Full           Yes      Yes                                 +---------+---------------+---------+-----------+----------+--------------+ SFJ      Full                                                        +---------+---------------+---------+-----------+----------+--------------+ FV Prox  Full                                                        +---------+---------------+---------+-----------+----------+--------------+ FV Mid   Full                                                        +---------+---------------+---------+-----------+----------+--------------+ FV DistalFull                                                        +---------+---------------+---------+-----------+----------+--------------+ PFV      Full                                                         +---------+---------------+---------+-----------+----------+--------------+ POP      None           No       No                   Acute          +---------+---------------+---------+-----------+----------+--------------+ PTV      None                                         Acute          +---------+---------------+---------+-----------+----------+--------------+ PERO     None                                         Acute          +---------+---------------+---------+-----------+----------+--------------+    Summary: RIGHT: - Findings consistent with age indeterminate deep vein thrombosis involving the right femoral vein, right popliteal vein, right posterior tibial veins, right peroneal veins, and right gastrocnemius veins. - No cystic structure found in the popliteal fossa.  LEFT: - Findings consistent with acute deep vein thrombosis involving the  left popliteal vein, left posterior tibial veins, and left peroneal veins. - No cystic structure found in the popliteal fossa.  *See table(s) above for measurements and observations. Electronically signed by Gretta Began MD on 04/22/2020 at 2:50:20 PM.    Final      PERTINENT LAB RESULTS: CBC: Recent Labs    04/27/20 0713 04/28/20 0604  WBC 23.6* 22.6*  HGB 12.5 12.8  HCT 38.9 40.3  PLT 302 299   CMET CMP     Component Value Date/Time   NA 136 04/28/2020 0231   K 5.4 (H) 04/28/2020 0231   CL 104 04/28/2020 0231   CO2 18 (L) 04/28/2020 0231   GLUCOSE 155 (H) 04/28/2020 0231   BUN 32 (H) 04/28/2020 0231   CREATININE 1.13 (H) 04/28/2020 0231   CALCIUM 8.1 (L) 04/28/2020 0231   PROT 5.4 (L) 04/27/2020 0400   ALBUMIN 2.5 (L) 04/27/2020 0400   AST 25 04/27/2020 0400   ALT 18 04/27/2020 0400   ALKPHOS 77 04/27/2020 0400   BILITOT 1.2 04/27/2020 0400   GFRNONAA 50 (L) 04/28/2020 0231   GFRAA 44 (L) 03/26/2017 0047    GFR Estimated Creatinine Clearance: 47.6 mL/min (A) (by C-G formula based on SCr of 1.13 mg/dL  (H)). No results for input(s): LIPASE, AMYLASE in the last 72 hours. No results for input(s): CKTOTAL, CKMB, CKMBINDEX, TROPONINI in the last 72 hours. Invalid input(s): POCBNP Recent Labs    04/26/20 0153 04/27/20 0400  DDIMER 17.76* 15.78*   No results for input(s): HGBA1C in the last 72 hours. No results for input(s): CHOL, HDL, LDLCALC, TRIG, CHOLHDL, LDLDIRECT in the last 72 hours. No results for input(s): TSH, T4TOTAL, T3FREE, THYROIDAB in the last 72 hours.  Invalid input(s): FREET3 No results for input(s): VITAMINB12, FOLATE, FERRITIN, TIBC, IRON, RETICCTPCT in the last 72 hours. Coags: No results for input(s): INR in the last 72 hours.  Invalid input(s): PT Microbiology: Recent Results (from the past 240 hour(s))  Respiratory Panel by RT PCR (Flu A&B, Covid) - Nasopharyngeal Swab     Status: Abnormal   Collection Time: 04/21/20  6:41 PM   Specimen: Nasopharyngeal Swab  Result Value Ref Range Status   SARS Coronavirus 2 by RT PCR POSITIVE (A) NEGATIVE Final    Comment: RESULT CALLED TO, READ BACK BY AND VERIFIED WITH: J FERRAIOLO 04/22/20 AT 0016 SK (NOTE) SARS-CoV-2 target nucleic acids are DETECTED.  SARS-CoV-2 RNA is generally detectable in upper respiratory specimens  during the acute phase of infection. Positive results are indicative of the presence of the identified virus, but do not rule out bacterial infection or co-infection with other pathogens not detected by the test. Clinical correlation with patient history and other diagnostic information is necessary to determine patient infection status. The expected result is Negative.  Fact Sheet for Patients:  https://www.moore.com/  Fact Sheet for Healthcare Providers: https://www.young.biz/  This test is not yet approved or cleared by the [place] FDA and  has been authorized for detection and/or diagnosis of SARS-CoV-2 by FDA under an Emergency Use Authorization  (EUA).  This EUA will remain in effect (meaning this test can be use d) for the duration of  the COVID-19 declaration under Section 564(b)(1) of the Act, 21 U.S.C. section 360bbb-3(b)(1), unless the authorization is terminated or revoked sooner.      Influenza A by PCR NEGATIVE NEGATIVE Final   Influenza B by PCR NEGATIVE NEGATIVE Final    Comment: (NOTE) The Xpert Xpress SARS-CoV-2/FLU/RSV assay is intended as  an aid in  the diagnosis of influenza from Nasopharyngeal swab specimens and  should not be used as a sole basis for treatment. Nasal washings and  aspirates are unacceptable for Xpert Xpress SARS-CoV-2/FLU/RSV  testing.  Fact Sheet for Patients: https://www.moore.com/  Fact Sheet for Healthcare Providers: https://www.young.biz/  This test is not yet approved or cleared by the [place] FDA and  has been authorized for detection and/or diagnosis of SARS-CoV-2 by  FDA under an Emergency Use Authorization (EUA). This EUA will remain  in effect (meaning this test can be used) for the duration of the  Covid-19 declaration under Section 564(b)(1) of the Act, 21  U.[place] section 360bbb-3(b)(1), unless the authorization is  terminated or revoked. Performed at [facility], [address] [place], [place] [address]   Blood culture (routine x 2)     Status: None   Collection Time: 04/21/20  7:50 PM   Specimen: BLOOD  Result Value Ref Range Status   Specimen Description BLOOD SITE NOT SPECIFIED  Final   Special Requests   Final    BOTTLES DRAWN AEROBIC AND ANAEROBIC Blood Culture adequate volume   Culture   Final    NO GROWTH 5 DAYS Performed at [facility], [address], [place], [place] [address]    Report Status 04/26/2020 FINAL  Final  Blood culture (routine x 2)     Status: None   Collection Time: 04/21/20  9:10 PM   Specimen: BLOOD  Result Value Ref Range Status   Specimen Description BLOOD SITE NOT SPECIFIED   Final   Special Requests   Final    BOTTLES DRAWN AEROBIC AND ANAEROBIC Blood Culture results may not be optimal due to an inadequate volume of blood received in culture bottles   Culture   Final    NO GROWTH 5 DAYS Performed at [facility], [address] [place], [place] [address]    Report Status 04/26/2020 FINAL  Final    FURTHER DISCHARGE INSTRUCTIONS:  Get Medicines reviewed and adjusted: Please take all your medications with you for your next visit with your Primary MD  Laboratory/radiological data: Please request your Primary MD to go over all hospital tests and procedure/radiological results at the follow up, please ask your Primary MD to get all Hospital records sent to his/her office.  In some cases, they will be blood work, cultures and biopsy results pending at the time of your discharge. Please request that your primary care M.D. goes through all the records of your hospital data and follows up on these results.  Also Note the following: If you experience worsening of your admission symptoms, develop shortness of breath, life threatening emergency, suicidal or homicidal thoughts you must seek medical attention immediately by calling 911 or calling your MD immediately  if symptoms less severe.  You must read complete instructions/literature along with all the possible adverse reactions/side effects for all the Medicines you take and that have been prescribed to you. Take any new Medicines after you have completely understood and accpet all the possible adverse reactions/side effects.   Do not drive when taking Pain medications or sleeping medications (Benzodaizepines)  Do not take more than prescribed Pain, Sleep and Anxiety Medications. It is not advisable to combine anxiety,sleep and pain medications without talking with your primary care practitioner  Special Instructions: If you have smoked or chewed Tobacco  in the last 2 yrs please stop smoking, stop any  regular Alcohol  and  or any Recreational drug use.  Wear Seat belts while driving.  Please note: You were cared for by a hospitalist during your hospital stay. Once you are discharged, your primary care physician will handle any further medical issues. Please note that NO REFILLS for any discharge medications will be authorized once you are discharged, as it is imperative that you return to your primary care physician (or establish a relationship with a primary care physician if you do not have one) for your post hospital discharge needs so that they can reassess your need for medications and monitor your lab values.  Total Time spent coordinating discharge including counseling, education and face to face time equals 35 minutes.  SignedJeoffrey Massed 04/28/2020 11:09 AM

## 2020-04-28 NOTE — Discharge Summary (Signed)
[name] Age: 79 y.o. Sex: female Date of Birth: Oct 24, 1940 MRN: [id number]. Admitting Physician: Eduard Clos, MD IDP:[id number], Provider Not In  Admit Date: 04/21/2020 Discharge date: 04/28/2020  Recommendations for Outpatient Follow-up:  1. Follow up with PCP in 1-2 weeks 2. Please obtain CMP/CBC in one week 3. Repeat Chest Xray in 4-6 week 4. Titrate off oxygen as tolerated over the next few weeks.  Admitted From:  Home  Disposition: SNF   Home Health: No  Equipment/Devices: oxygen 1-2 L at rest-on 4L with ambulation  Discharge Condition: Stable  CODE STATUS: FULL CODE  Diet recommendation:  Diet Order            Diet - low sodium heart healthy           Diet regular Room service appropriate? Yes; Fluid consistency: Thin  Diet effective now                  Brief Narrative: Patient is a 79 y.o. female with PMHx of HTN-who presented with left-sided weakness and shortness of breath-was found to have acute CVA along with severe hypoxic respiratory failure due to COVID-19 pneumonitis.  See below for further details.  COVID-19 vaccinated status: Unvaccinated  Significant Events: 11/8>> presented to [facility] with left-sided weakness due to CVA and severe hypoxia due to COVID-19 pneumonia  Significant studies: 11/8>> chest x-ray: Hazy bilateral airspace opacities 11/8>> CT head: Small infarct in the posterior right insula. 11/9>> MRI brain: Acute ischemia in the right MCA territory 11/9>> MRI brain: No large vessel occlusion 11/9>> carotid Doppler: No significant stenosis 11/9>> lower extremity Doppler: Bilateral lower extremity DVT and multiple veins. 11/9>> A1c: 5.5 11/9>> LDL: 93 11/10>> Echo: EF 60-65%, grade 1 diastolic dysfunction, RV systolic function is normal.  [id number] medications: Steroids: 11/8>> Remdesivir: 11/8>>11/13 Actemra: 11/8 x 1  Antibiotics: None  Microbiology data: 11/8 >>blood culture: No  growth  Procedures: None  Consults: None  Brief Hospital Course: Acute Hypoxic Resp Failure due to Covid 19 Viral pneumonia: Had severe hypoxemia when she first presented to the hospital-remarkable improvement -down to around 2 L of oxygen at rest-occurred around 4 L of oxygen with ambulation. Although significantly better-continues to have some amount of exertional shortness of breath with ambulation-she is essentially asymptomatic at rest. Has completed a course of Remdesivir-was given 1 dose of Actemra-will be discharged on tapering prednisone. Please continue to assess and titrate down oxygen over the next few weeks.  COVID-19 Labs:  Recent Labs    04/26/20 0153 04/27/20 0400  DDIMER 17.76* 15.78*  CRP 3.4* 1.7*    Lab Results  Component Value Date   SARSCOV2NAA POSITIVE (A) 04/21/2020    Bilateral lower extremity DVT: Likely provoked by COVID-19-initially on IV heparin-but has been switched to Eliquis. Per patient-she has chronic swelling of the lower extremities and they are currently at baseline.  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  Left-sided weakness has improved-SLP evaluation completed on 11/10-has been started on a diet.   Appreciate stroke team MD evaluation-Per my conversation with Dr. Pearlean Brownie on 11/11-okay to remain on Eliquis-aspirin not needed.  Continue high intensity statin. Please ensure outpatient follow-up with neurology.  HTN: BP relatively better-continue Lopressor-and amlodipine-reassess at [facility] and optimize accordingly.  AKI on CKD stage IIIa: AKI likely hemodynamically mediated-improved with supportive care.  Avoid nephrotoxic agents.   Obesity: Estimated body mass index is 36.03 kg/m  as calculated from the following:   Height as of this encounter: 5\' 5"  (1.651 m).   Weight as of this encounter: 98.2 kg  Discharge Diagnoses:  Principal  Problem:   Acute CVA (cerebrovascular accident) (HCC) Active Problems:   Acute respiratory failure due to COVID-19 [facility])   ARF (acute renal failure) (HCC)   Essential hypertension   Discharge Instructions:    Person Under Monitoring Name: Beverly Howell  Location: [address] [facility] [place] [address]   Infection Prevention Recommendations for Individuals Confirmed to have, or Being Evaluated for, 2019 Novel Coronavirus (COVID-19) Infection Who Receive Care at Home  Individuals who are confirmed to have, or are being evaluated for, COVID-19 should follow the prevention steps below until a healthcare provider or local or state health department says they can return to normal activities.  Stay home except to get medical care You should restrict activities outside your home, except for getting medical care. Do not go to work, school, or public areas, and do not use public transportation or taxis.  Call ahead before visiting your doctor Before your medical appointment, call the healthcare provider and tell them that you have, or are being evaluated for, COVID-19 infection. This will help the healthcare provider's office take steps to keep other people from getting infected. Ask your healthcare provider to call the local or state health department.  Monitor your symptoms Seek prompt medical attention if your illness is worsening (e.g., difficulty breathing). Before going to your medical appointment, call the healthcare provider and tell them that you have, or are being evaluated for, COVID-19 infection. Ask your healthcare provider to call the local or state health department.  Wear a facemask You should wear a facemask that covers your nose and mouth when you are in the same room with other people and when you visit a healthcare provider. People who live with or visit you should also wear a facemask while they are in the same room with you.  Separate yourself from other  people in your home As much as possible, you should stay in a different room from other people in your home. Also, you should use a separate bathroom, if available.  Avoid sharing household items You should not share dishes, drinking glasses, cups, eating utensils, towels, bedding, or other items with other people in your home. After using these items, you should wash them thoroughly with soap and water.  Cover your coughs and sneezes Cover your mouth and nose with a tissue when you cough or sneeze, or you can cough or sneeze into your sleeve. Throw used tissues in a lined trash can, and immediately wash your hands with soap and water for at least 20 seconds or use an alcohol-based hand rub.  Wash your [employer] your hands often and thoroughly with soap and water for at least 20 seconds. You can use an alcohol-based hand sanitizer if soap and water are not available and if your hands are not visibly dirty. Avoid touching your eyes, nose, and mouth with unwashed hands.   Prevention Steps for Caregivers and Household Members of Individuals Confirmed to have, or Being Evaluated for, COVID-19 Infection Being Cared for in the Home  If you live with, or provide care at home for, a person confirmed to have, or being evaluated for, COVID-19 infection please follow these guidelines to prevent infection:  Follow healthcare provider's instructions Make sure that you understand and can help the patient follow any healthcare provider instructions for  all care.  Provide for the patient's basic needs You should help the patient with basic needs in the home and provide support for getting groceries, prescriptions, and other personal needs.  Monitor the patient's symptoms If they are getting sicker, call his or her medical provider and tell them that the patient has, or is being evaluated for, COVID-19 infection. This will help the healthcare provider's office take steps to keep other people from  getting infected. Ask the healthcare provider to call the local or state health department.  Limit the number of people who have contact with the patient  If possible, have only one caregiver for the patient.  Other household members should stay in another home or place of residence. If this is not possible, they should stay  in another room, or be separated from the patient as much as possible. Use a separate bathroom, if available.  Restrict visitors who do not have an essential need to be in the home.  Keep older adults, very young children, and other sick people away from the patient Keep older adults, very young children, and those who have compromised immune systems or chronic health conditions away from the patient. This includes people with chronic heart, lung, or kidney conditions, diabetes, and cancer.  Ensure good ventilation Make sure that shared spaces in the home have good air flow, such as from an air conditioner or an opened window, weather permitting.  Wash your hands often  Wash your hands often and thoroughly with soap and water for at least 20 seconds. You can use an alcohol based hand sanitizer if soap and water are not available and if your hands are not visibly dirty.  Avoid touching your eyes, nose, and mouth with unwashed hands.  Use disposable paper towels to dry your hands. If not available, use dedicated cloth towels and replace them when they become wet.  Wear a facemask and gloves  Wear a disposable facemask at all times in the room and gloves when you touch or have contact with the patient's blood, body fluids, and/or secretions or excretions, such as sweat, saliva, sputum, nasal mucus, vomit, urine, or feces.  Ensure the mask fits over your nose and mouth tightly, and do not touch it during use.  Throw out disposable facemasks and gloves after using them. Do not reuse.  Wash your hands immediately after removing your facemask and gloves.  If your  personal clothing becomes contaminated, carefully remove clothing and launder. Wash your hands after handling contaminated clothing.  Place all used disposable facemasks, gloves, and other waste in a lined container before disposing them with other household waste.  Remove gloves and wash your hands immediately after handling these items.  Do not share dishes, glasses, or other household items with the patient  Avoid sharing household items. You should not share dishes, drinking glasses, cups, eating utensils, towels, bedding, or other items with a patient who is confirmed to have, or being evaluated for, COVID-19 infection.  After the person uses these items, you should wash them thoroughly with soap and water.  Wash laundry thoroughly  Immediately remove and wash clothes or bedding that have blood, body fluids, and/or secretions or excretions, such as sweat, saliva, sputum, nasal mucus, vomit, urine, or feces, on them.  Wear gloves when handling laundry from the patient.  Read and follow directions on labels of laundry or clothing items and detergent. In general, wash and dry with the warmest temperatures recommended on the label.  Clean all  areas the individual has used often  Clean all touchable surfaces, such as counters, tabletops, doorknobs, bathroom fixtures, toilets, phones, keyboards, tablets, and bedside tables, every day. Also, clean any surfaces that may have blood, body fluids, and/or secretions or excretions on them.  Wear gloves when cleaning surfaces the patient has come in contact with.  Use a diluted bleach solution (e.g., dilute bleach with 1 part bleach and 10 parts water) or a household disinfectant with a label that says EPA-registered for coronaviruses. To make a bleach solution at home, add 1 tablespoon of bleach to 1 quart (4 cups) of water. For a larger supply, add  cup of bleach to 1 gallon (16 cups) of water.  Read labels of cleaning products and follow  recommendations provided on product labels. Labels contain instructions for safe and effective use of the cleaning product including precautions you should take when applying the product, such as wearing gloves or eye protection and making sure you have good ventilation during use of the product.  Remove gloves and wash hands immediately after cleaning.  Monitor yourself for signs and symptoms of illness Caregivers and household members are considered close contacts, should monitor their health, and will be asked to limit movement outside of the home to the extent possible. Follow the monitoring steps for close contacts listed on the symptom monitoring form.   ? If you have additional questions, contact your local health department or call the epidemiologist on call at [phone] (available 24/7). ? This guidance is subject to change. For the most up-to-date guidance from [facility], please refer to their website: [email or web address].hu    Activity:  As tolerated with Full fall precautions use walker/cane & assistance as needed   Discharge Instructions    Ambulatory referral to Neurology   Complete by: As directed    An appointment is requested in approximately: 4 weeks   Call MD for:  extreme fatigue   Complete by: As directed    Diet - low sodium heart healthy   Complete by: As directed    Discharge instructions   Complete by: As directed    Follow with Primary MD in 1-2 weeks  Please get a complete blood count and chemistry panel checked by your Primary MD at your next visit, and again as instructed by your Primary MD.  Get Medicines reviewed and adjusted: Please take all your medications with you for your next visit with your Primary MD  Laboratory/radiological data: Please request your Primary MD to go over all hospital tests and procedure/radiological results at the follow up, please ask your Primary MD to get all Hospital  records sent to his/her office.  In some cases, they will be blood work, cultures and biopsy results pending at the time of your discharge. Please request that your primary care M.D. follows up on these results.  Also Note the following: If you experience worsening of your admission symptoms, develop shortness of breath, life threatening emergency, suicidal or homicidal thoughts you must seek medical attention immediately by calling 911 or calling your MD immediately  if symptoms less severe.  You must read complete instructions/literature along with all the possible adverse reactions/side effects for all the Medicines you take and that have been prescribed to you. Take any new Medicines after you have completely understood and accpet all the possible adverse reactions/side effects.   Do not drive when taking Pain medications or sleeping medications (Benzodaizepines)  Do not take more than prescribed Pain, Sleep and Anxiety Medications. It is  not advisable to combine anxiety,sleep and pain medications without talking with your primary care practitioner  Special Instructions: If you have smoked or chewed Tobacco  in the last 2 yrs please stop smoking, stop any regular Alcohol  and or any Recreational drug use.  Wear Seat belts while driving.  Please note: You were cared for by a hospitalist during your hospital stay. Once you are discharged, your primary care physician will handle any further medical issues. Please note that NO REFILLS for any discharge medications will be authorized once you are discharged, as it is imperative that you return to your primary care physician (or establish a relationship with a primary care physician if you do not have one) for your post hospital discharge needs so that they can reassess your need for medications and monitor your lab values.   21 days of isolation from 04/21/2020   Increase activity slowly   Complete by: As directed      Allergies as of  04/28/2020   No Known Allergies     Medication List    STOP taking these medications   aspirin-acetaminophen-caffeine 250-250-65 MG tablet Commonly known as: EXCEDRIN MIGRAINE   diltiazem 240 MG 24 hr capsule Commonly known as: DILACOR XR   lisinopril 10 MG tablet Commonly known as: ZESTRIL     TAKE these medications   amLODipine 5 MG tablet Commonly known as: NORVASC Take 1 tablet (5 mg total) by mouth daily. Start taking on: April 29, 2020   apixaban 5 MG Tabs tablet Commonly known as: ELIQUIS Take 2 tablets (10 mg total) by mouth 2 (two) times daily for 4 doses.   apixaban 5 MG Tabs tablet Commonly known as: ELIQUIS Take 1 tablet (5 mg total) by mouth 2 (two) times daily. Start 11/17 evening. Start taking on: April 30, 2020   atorvastatin 40 MG tablet Commonly known as: LIPITOR Take 1 tablet (40 mg total) by mouth daily. Start taking on: April 29, 2020   CALCIUM 600-D PO Take 1 tablet by mouth daily.   cholecalciferol 25 MCG (1000 UNIT) tablet Commonly known as: VITAMIN D Take 1,000 Units by mouth daily.   Fish Oil Triple Strength 1400 MG Caps Take 1,400 mg by mouth daily.   metoprolol tartrate 25 MG tablet Commonly known as: LOPRESSOR Take 2 tablets (50 mg total) by mouth 2 (two) times daily. What changed:   medication strength  how much to take  additional instructions   pantoprazole 40 MG tablet Commonly known as: PROTONIX Take 1 tablet (40 mg total) by mouth daily. Start taking on: April 29, 2020   predniSONE 10 MG tablet Commonly known as: DELTASONE Take 40 mg daily for 1 day, 30 mg daily for 1 day, 20 mg daily for 1 days,10 mg daily for 1 day, then stop       Contact information for follow-up providers    [facility]. Schedule an appointment as soon as possible for a visit in 4 week(s).   Contact information: [address]     [address] [place] [address] [phone]            Contact information for after-discharge care    Destination    [facility] .   Service: Skilled Nursing Contact information: [address] [place] [address] [phone]                 No Known Allergies    Other Procedures/Studies: DG Abd 1 View  Result Date: 04/22/2020 CLINICAL DATA:  MRI clearance.  Altered mental status. EXAM: ABDOMEN - 1 VIEW COMPARISON:  None. FINDINGS: The bowel gas pattern is normal. No radio-opaque calculi or other significant radiographic abnormality are seen. There appear to be hazy airspace opacities involving the lungs bilaterally. No metallic foreign body identified. There are degenerative changes of both hips, right worse than left. There are degenerative changes of the lumbar spine. IMPRESSION: 1. No metallic foreign body identified. 2. Hazy airspace opacities involving the lungs bilaterally. 3. Nonobstructive bowel gas pattern. Electronically Signed   By: Katherine Mantle M.D.   On: 04/22/2020 00:23   CT Head Wo Contrast  Result Date: 04/21/2020 CLINICAL DATA:  79 year old female with intermittent dysarthria and left side weakness x1 week, progressive since 1400 hours on 04/20/2020. Hypoxia on initial presentation. EXAM: CT HEAD WITHOUT CONTRAST TECHNIQUE: Contiguous axial images were obtained from the base of the skull through the vertex without intravenous contrast. COMPARISON:  None. FINDINGS: Brain: Small chronic infarct in the left cerebellum (series 3, image 8). No acute intracranial hemorrhage identified. No midline shift, mass effect, or evidence of intracranial mass lesion. No ventriculomegaly. Mild for age scattered bilateral white matter hypodensity. Small age indeterminate hypodense area in the posterior right insula on series 3, image 15. No other no acute or subacute cortically based infarct identified. No supratentorial cortical encephalomalacia identified. Deep gray matter nuclei and brainstem appear  within normal limits. Vascular: Calcified atherosclerosis at the skull base. No suspicious intracranial vascular hyperdensity. Skull: No acute osseous abnormality identified. Sinuses/Orbits: Small fluid level in the left maxillary sinus. Bubbly opacity there as well as in the right maxillary and sphenoid sinuses. Bubbly opacity in the posterior left ethmoid. Tympanic cavities and mastoids appear clear. Other: No acute orbit or scalp soft tissue finding identified. IMPRESSION: 1. Small infarct at the posterior right insula, age indeterminate but suspicious for subacute ischemia in this setting. No associated hemorrhage or mass effect. 2. Elsewhere mild for age cerebral white matter changes, and a small chronic left cerebellar infarct. 3. Acute paranasal sinus inflammation. Electronically Signed   By: Odessa Fleming M.D.   On: 04/21/2020 22:56   MR ANGIO HEAD WO CONTRAST  Result Date: 04/22/2020 CLINICAL DATA:  Follow-up examination for acute stroke. EXAM: MRI HEAD WITHOUT CONTRAST MRA HEAD WITHOUT CONTRAST TECHNIQUE: Multiplanar, multiecho pulse sequences of the brain and surrounding structures were obtained without intravenous contrast. Angiographic images of the head were obtained using MRA technique without contrast. COMPARISON:  Prior CT from 04/21/2020. FINDINGS: MRI HEAD FINDINGS Brain: Examination degraded by motion artifact. Generalized age-related cerebral atrophy. Mild chronic microvascular ischemic disease present within the periventricular and deep white matter both cerebral hemispheres. Small remote left cerebellar infarct noted. Patchy restricted diffusion seen involving the posterior right insula as well as the overlying right posterior frontal and parietal lobes, consistent with right MCA territory infarcts. No significant mass effect or associated hemorrhage. An embolic etiology is suspected given distribution. No other diffusion abnormality to suggest acute or subacute ischemia. Gray-white matter  differentiation otherwise maintained. No other areas of encephalomalacia to suggest chronic cortical infarction on this motion degraded exam. No other definite foci of susceptibility artifact to suggest acute or chronic intracranial hemorrhage. No mass lesion, midline shift or mass effect. No hydrocephalus or extra-axial fluid collection. No made of a partially empty sella. Midline structures intact. Vascular: Major intracranial vascular flow voids are maintained. Skull and upper cervical spine: Craniocervical junction within normal limits. Multilevel cervical spondylosis noted within the  upper cervical spine without high-grade stenosis. Bone marrow signal intensity normal. No scalp soft tissue abnormality. Sinuses/Orbits: Globes and orbital soft tissues within normal limits. Scattered mucosal thickening noted throughout the ethmoidal air cells and maxillary sinuses. Superimposed air-fluid level noted within the left maxillary sinus. Trace bilateral mastoid effusions. Visualized nasopharynx within normal limits. Inner ear structures grossly normal. Other: None. MRA HEAD FINDINGS ANTERIOR CIRCULATION: Examination somewhat degraded by motion artifact. Visualized distal cervical segments of the internal carotid arteries are widely patent with antegrade flow. Petrous, cavernous, and supraclinoid ICAs demonstrate probable atheromatous irregularity without appreciable hemodynamically significant stenosis. A1 segments patent bilaterally. Normal anterior communicating artery complex. Anterior cerebral arteries patent to their distal aspects without stenosis. No M1 stenosis or occlusion. Normal MCA bifurcations. Distal MCA branches well perfused and fairly symmetric. POSTERIOR CIRCULATION: Vertebral arteries patent to the vertebrobasilar junction without stenosis. Both PICAs patent proximally. Basilar patent to its distal aspect without stenosis. Superior cerebral arteries patent bilaterally. Right PCA supplied via a  hypoplastic right P1 segment and prominent right posterior communicating artery. Fetal type origin left PCA. Both PCAs perfused to their distal aspects without proximal flow-limiting stenosis. No aneurysm. IMPRESSION: MRI HEAD IMPRESSION: 1. 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 An embolic etiology is suspected. No significant mass effect. 2. Underlying age-related cerebral atrophy with mild chronic small vessel ischemic disease. Small remote left cerebellar infarct. MRA HEAD IMPRESSION: 1. Technically limited exam due to motion artifact. 2. Negative intracranial MRA for large vessel occlusion. No hemodynamically significant or correctable stenosis. 3. Mild atheromatous irregularity about the carotid siphons without significant stenosis. Electronically Signed   By: Rise Mu M.D.   On: 04/22/2020 01:29   MR BRAIN WO CONTRAST  Result Date: 04/22/2020 CLINICAL DATA:  Follow-up examination for acute stroke. EXAM: MRI HEAD WITHOUT CONTRAST MRA HEAD WITHOUT CONTRAST TECHNIQUE: Multiplanar, multiecho pulse sequences of the brain and surrounding structures were obtained without intravenous contrast. Angiographic images of the head were obtained using MRA technique without contrast. COMPARISON:  Prior CT from 04/21/2020. FINDINGS: MRI HEAD FINDINGS Brain: Examination degraded by motion artifact. Generalized age-related cerebral atrophy. Mild chronic microvascular ischemic disease present within the periventricular and deep white matter both cerebral hemispheres. Small remote left cerebellar infarct noted. Patchy restricted diffusion seen involving the posterior right insula as well as the overlying right posterior frontal and parietal lobes, consistent with right MCA territory infarcts. No significant mass effect or associated hemorrhage. An embolic etiology is suspected given distribution.  No other diffusion abnormality to suggest acute or subacute ischemia. Gray-white matter differentiation otherwise maintained. No other areas of encephalomalacia to suggest chronic cortical infarction on this motion degraded exam. No other definite foci of susceptibility artifact to suggest acute or chronic intracranial hemorrhage. No mass lesion, midline shift or mass effect. No hydrocephalus or extra-axial fluid collection. No made of a partially empty sella. Midline structures intact. Vascular: Major intracranial vascular flow voids are maintained. Skull and upper cervical spine: Craniocervical junction within normal limits. Multilevel cervical spondylosis noted within the upper cervical spine without high-grade stenosis. Bone marrow signal intensity normal. No scalp soft tissue abnormality. Sinuses/Orbits: Globes and orbital soft tissues within normal limits. Scattered mucosal thickening noted throughout the ethmoidal air cells and maxillary sinuses. Superimposed air-fluid level noted within the left maxillary sinus. Trace bilateral mastoid effusions. Visualized nasopharynx within normal limits. Inner ear structures grossly normal. Other: None. MRA HEAD FINDINGS ANTERIOR CIRCULATION: Examination somewhat degraded  by motion artifact. Visualized distal cervical segments of the internal carotid arteries are widely patent with antegrade flow. Petrous, cavernous, and supraclinoid ICAs demonstrate probable atheromatous irregularity without appreciable hemodynamically significant stenosis. A1 segments patent bilaterally. Normal anterior communicating artery complex. Anterior cerebral arteries patent to their distal aspects without stenosis. No M1 stenosis or occlusion. Normal MCA bifurcations. Distal MCA branches well perfused and fairly symmetric. POSTERIOR CIRCULATION: Vertebral arteries patent to the vertebrobasilar junction without stenosis. Both PICAs patent proximally. Basilar patent to its distal aspect without  stenosis. Superior cerebral arteries patent bilaterally. Right PCA supplied via a hypoplastic right P1 segment and prominent right posterior communicating artery. Fetal type origin left PCA. Both PCAs perfused to their distal aspects without proximal flow-limiting stenosis. No aneurysm. IMPRESSION: MRI HEAD IMPRESSION: 1. 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 An embolic etiology is suspected. No significant mass effect. 2. Underlying age-related cerebral atrophy with mild chronic small vessel ischemic disease. Small remote left cerebellar infarct. MRA HEAD IMPRESSION: 1. Technically limited exam due to motion artifact. 2. Negative intracranial MRA for large vessel occlusion. No hemodynamically significant or correctable stenosis. 3. Mild atheromatous irregularity about the carotid siphons without significant stenosis. Electronically Signed   By: Rise Mu M.D.   On: 04/22/2020 01:29   DG Chest Portable 1 View  Result Date: 04/21/2020 CLINICAL DATA:  Hypoxia EXAM: PORTABLE CHEST 1 VIEW COMPARISON:  None. FINDINGS: The heart size is enlarged. There are hazy bilateral airspace opacities. There is no pneumothorax. No large pleural effusion. There are end-stage degenerative changes of both glenohumeral joints. IMPRESSION: Cardiomegaly with hazy bilateral airspace opacities concerning for pulmonary edema or an atypical infectious process. Electronically Signed   By: Katherine Mantle M.D.   On: 04/21/2020 20:21   ECHOCARDIOGRAM COMPLETE  Result Date: 04/23/2020    ECHOCARDIOGRAM REPORT   Patient Name:   Beverly Howell Date of Exam: 04/23/2020 Medical Rec #:  [id number]     Height:       65.0 in Accession #:    [id number]    Weight:       183.4 lb Date of Birth:  [birth year]      BSA:          1.907 m Patient Age:    79 years      BP:           151/94 mmHg Patient Gender: F              HR:           79 bpm. Exam Location:  Inpatient Procedure: 2D Echo, Cardiac Doppler and Color Doppler Indications:    Stroke 434.91 / I163.9  History:        Patient has no prior history of Echocardiogram examinations.                 Risk Factors:Hypertension and Dyslipidemia. COVID-19 Positive.  Sonographer:    Elmarie Shiley Dance Referring Phys: [id number] ARSHAD N KAKRAKANDY IMPRESSIONS  1. Left ventricular ejection fraction, by estimation, is 60 to 65%. The left ventricle has normal function. The left ventricle has no regional wall motion abnormalities. Left ventricular diastolic parameters are consistent with Grade I diastolic dysfunction (impaired relaxation).  2. Right ventricular systolic function is normal. The right ventricular size is normal. There is normal pulmonary artery systolic pressure. The estimated right ventricular systolic pressure is 27.4 mmHg.  3. The mitral valve is normal in  structure. Trivial mitral valve regurgitation. No evidence of mitral stenosis.  4. The aortic valve is normal in structure. There is moderate calcification of the aortic valve. There is moderate thickening of the aortic valve. Aortic valve regurgitation is trivial. Mild to moderate aortic valve sclerosis/calcification is present, without any evidence of aortic stenosis. Aortic valve mean gradient measures 8.5 mmHg. Aortic valve Vmax measures 2.13 m/s.  5. The inferior vena cava is normal in size with greater than 50% respiratory variability, suggesting right atrial pressure of 3 mmHg. Conclusion(s)/Recommendation(s): No intracardiac source of embolism detected on this transthoracic study. A transesophageal echocardiogram is recommended to exclude cardiac source of embolism if clinically indicated. FINDINGS  Left Ventricle: Left ventricular ejection fraction, by estimation, is 60 to 65%. The left ventricle has normal function. The left ventricle has no regional wall motion abnormalities. The left ventricular internal cavity size  was normal in size. There is  no left ventricular hypertrophy. Left ventricular diastolic parameters are consistent with Grade I diastolic dysfunction (impaired relaxation). Right Ventricle: The right ventricular size is normal. No increase in right ventricular wall thickness. Right ventricular systolic function is normal. There is normal pulmonary artery systolic pressure. The tricuspid regurgitant velocity is 2.47 m/s, and  with an assumed right atrial pressure of 3 mmHg, the estimated right ventricular systolic pressure is 27.4 mmHg. Left Atrium: Left atrial size was normal in size. Right Atrium: Right atrial size was normal in size. Pericardium: There is no evidence of pericardial effusion. Mitral Valve: The mitral valve is normal in structure. Trivial mitral valve regurgitation. No evidence of mitral valve stenosis. Tricuspid Valve: The tricuspid valve is normal in structure. Tricuspid valve regurgitation is mild . No evidence of tricuspid stenosis. Aortic Valve: The aortic valve is normal in structure. There is moderate calcification of the aortic valve. There is moderate thickening of the aortic valve. Aortic valve regurgitation is trivial. Mild to moderate aortic valve sclerosis/calcification is present, without any evidence of aortic stenosis. Aortic valve mean gradient measures 8.5 mmHg. Aortic valve peak gradient measures 18.2 mmHg. Aortic valve area, by VTI measures 1.58 cm. Pulmonic Valve: The pulmonic valve was normal in structure. Pulmonic valve regurgitation is not visualized. No evidence of pulmonic stenosis. Aorta: The aortic root is normal in size and structure. Venous: The inferior vena cava is normal in size with greater than 50% respiratory variability, suggesting right atrial pressure of 3 mmHg. IAS/Shunts: No atrial level shunt detected by color flow Doppler.  LEFT VENTRICLE PLAX 2D LVIDd:         3.90 cm  Diastology LVIDs:         2.72 cm  LV e' medial:    4.35 cm/s LV PW:         0.92 cm   LV E/e' medial:  14.5 LV IVS:        1.15 cm  LV e' lateral:   6.42 cm/s LVOT diam:     2.00 cm  LV E/e' lateral: 9.8 LV SV:         63 LV SV Index:   33 LVOT Area:     3.14 cm  RIGHT VENTRICLE             IVC RV Basal diam:  2.65 cm     IVC diam: 1.81 cm RV S prime:     12.00 cm/s TAPSE (M-mode): 2.0 cm LEFT ATRIUM             Index  RIGHT ATRIUM          Index LA diam:        3.20 cm 1.68 cm/m  RA Area:     9.10 cm LA Vol (A2C):   56.4 ml 29.58 ml/m RA Volume:   14.70 ml 7.71 ml/m LA Vol (A4C):   59.4 ml 31.15 ml/m LA Biplane Vol: 60.1 ml 31.52 ml/m  AORTIC VALVE AV Area (Vmax):    1.49 cm AV Area (Vmean):   1.39 cm AV Area (VTI):     1.58 cm AV Vmax:           213.50 cm/s AV Vmean:          135.000 cm/s AV VTI:            0.395 m AV Peak Grad:      18.2 mmHg AV Mean Grad:      8.5 mmHg LVOT Vmax:         101.40 cm/s LVOT Vmean:        59.550 cm/s LVOT VTI:          0.199 m LVOT/AV VTI ratio: 0.50  AORTA Ao Root diam: 2.80 cm Ao Asc diam:  3.40 cm MITRAL VALVE                TRICUSPID VALVE MV Area (PHT): 2.45 cm     TR Peak grad:   24.4 mmHg MV Decel Time: 310 msec     TR Vmax:        247.00 cm/s MV E velocity: 62.90 cm/s MV A velocity: 103.00 cm/s  SHUNTS MV E/A ratio:  0.61         Systemic VTI:  0.20 m                             Systemic Diam: 2.00 cm Donato Schultz MD Electronically signed by Donato Schultz MD Signature Date/Time: 04/23/2020/2:48:24 PM    Final    VAS US CAROTID (at [facility] and [facility] only)  Result Date: 04/22/2020 Carotid Arterial Duplex Study Indications:       CVA and DVT positive. Risk Factors:      Hypertension. Other Factors:     COVID 19 positive. Comparison Study:  No prior studies. Performing Technologist: Chanda Busing RVT  Examination Guidelines: A complete evaluation includes B-mode imaging, spectral Doppler, color Doppler, and power Doppler as needed of all accessible portions of each vessel. Bilateral testing is considered an integral part of a complete examination.  Limited examinations for reoccurring indications may be performed as noted.  Right Carotid Findings: +----------+--------+--------+--------+-----------------------+--------+           PSV cm/sEDV cm/sStenosisPlaque Description     Comments +----------+--------+--------+--------+-----------------------+--------+ CCA Prox  56      5               smooth and heterogenous         +----------+--------+--------+--------+-----------------------+--------+ CCA Distal49      11              smooth and heterogenous         +----------+--------+--------+--------+-----------------------+--------+ ICA Prox  48      14              smooth and heterogenous         +----------+--------+--------+--------+-----------------------+--------+ ICA Distal87      24  tortuous +----------+--------+--------+--------+-----------------------+--------+ ECA       59      6                                               +----------+--------+--------+--------+-----------------------+--------+ +----------+--------+-------+--------+-------------------+           PSV cm/sEDV cmsDescribeArm Pressure (mmHG) +----------+--------+-------+--------+-------------------+ [id number]                                         +----------+--------+-------+--------+-------------------+ +---------+--------+--+--------+--+---------+ VertebralPSV cm/s59EDV cm/s13Antegrade +---------+--------+--+--------+--+---------+  Left Carotid Findings: +----------+--------+--------+--------+-----------------------+--------+           PSV cm/sEDV cm/sStenosisPlaque Description     Comments +----------+--------+--------+--------+-----------------------+--------+ CCA Prox  97      19              smooth and heterogenous         +----------+--------+--------+--------+-----------------------+--------+ CCA Distal49      11              smooth and heterogenous          +----------+--------+--------+--------+-----------------------+--------+ ICA Prox  37      11              smooth and heterogenous         +----------+--------+--------+--------+-----------------------+--------+ ICA Distal59      22                                     tortuous +----------+--------+--------+--------+-----------------------+--------+ ECA       44      5                                               +----------+--------+--------+--------+-----------------------+--------+ +----------+--------+--------+--------+-------------------+           PSV cm/sEDV cm/sDescribeArm Pressure (mmHG) +----------+--------+--------+--------+-------------------+ [id number]                                          +----------+--------+--------+--------+-------------------+ +---------+--------+--+--------+--+---------+ VertebralPSV cm/s33EDV cm/s10Antegrade +---------+--------+--+--------+--+---------+   Summary: Right Carotid: Velocities in the right ICA are consistent with a 1-39% stenosis. Left Carotid: Velocities in the left ICA are consistent with a 1-39% stenosis. Vertebrals: Bilateral vertebral arteries demonstrate antegrade flow. *See table(s) above for measurements and observations.  Electronically signed by Delia Heady MD on 04/22/2020 at 1:17:39 PM.    Final    VAS [place] LOWER EXTREMITY VENOUS (DVT)  Result Date: 04/22/2020  Lower Venous DVT Study Indications: Elevated Ddimer.  Risk Factors: COVID 19 positive. Comparison Study: No prior studies. Performing Technologist: Chanda Busing RVT  Examination Guidelines: A complete evaluation includes B-mode imaging, spectral Doppler, color Doppler, and power Doppler as needed of all accessible portions of each vessel. Bilateral testing is considered an integral part of a complete examination. Limited examinations for reoccurring indications may be performed as noted. The reflux portion of the exam is performed with the patient  in reverse Trendelenburg.  +---------+---------------+---------+-----------+----------+-----------------+ RIGHT    CompressibilityPhasicitySpontaneityPropertiesThrombus Aging    +---------+---------------+---------+-----------+----------+-----------------+ CFV  Full           Yes      Yes                                    +---------+---------------+---------+-----------+----------+-----------------+ SFJ      Full                                                           +---------+---------------+---------+-----------+----------+-----------------+ FV Prox  Full                                                           +---------+---------------+---------+-----------+----------+-----------------+ FV Mid   Partial        No       No                   Age Indeterminate +---------+---------------+---------+-----------+----------+-----------------+ FV DistalNone           No       No                   Age Indeterminate +---------+---------------+---------+-----------+----------+-----------------+ PFV      Full                                                           +---------+---------------+---------+-----------+----------+-----------------+ POP      None           No       No                   Age Indeterminate +---------+---------------+---------+-----------+----------+-----------------+ PTV      None                                         Age Indeterminate +---------+---------------+---------+-----------+----------+-----------------+ PERO     None                                         Age Indeterminate +---------+---------------+---------+-----------+----------+-----------------+ Gastroc  None                                         Age Indeterminate +---------+---------------+---------+-----------+----------+-----------------+   +---------+---------------+---------+-----------+----------+--------------+ LEFT      CompressibilityPhasicitySpontaneityPropertiesThrombus Aging +---------+---------------+---------+-----------+----------+--------------+ CFV      Full           Yes      Yes                                 +---------+---------------+---------+-----------+----------+--------------+ SFJ      Full                                                        +---------+---------------+---------+-----------+----------+--------------+  FV Prox  Full                                                        +---------+---------------+---------+-----------+----------+--------------+ FV Mid   Full                                                        +---------+---------------+---------+-----------+----------+--------------+ FV DistalFull                                                        +---------+---------------+---------+-----------+----------+--------------+ PFV      Full                                                        +---------+---------------+---------+-----------+----------+--------------+ POP      None           No       No                   Acute          +---------+---------------+---------+-----------+----------+--------------+ PTV      None                                         Acute          +---------+---------------+---------+-----------+----------+--------------+ PERO     None                                         Acute          +---------+---------------+---------+-----------+----------+--------------+    Summary: RIGHT: - Findings consistent with age indeterminate deep vein thrombosis involving the right femoral vein, right popliteal vein, right posterior tibial veins, right peroneal veins, and right gastrocnemius veins. - No cystic structure found in the popliteal fossa.  LEFT: - Findings consistent with acute deep vein thrombosis involving the left popliteal vein, left posterior tibial veins, and left peroneal veins. - No cystic  structure found in the popliteal fossa.  *See table(s) above for measurements and observations. Electronically signed by Gretta Began MD on 04/22/2020 at 2:50:20 PM.    Final      TODAY-DAY OF DISCHARGE:  Subjective:   Beverly Howell today has no headache,no chest abdominal pain,no new weakness tingling or numbness, feels much better wants to go home today.   Objective:   Blood pressure (!) 154/84, pulse 79, temperature 97.8 F (36.6 C), temperature source Oral, resp. rate (!) 26, height 5\' 5"  (1.651 m), weight 98.2 kg, SpO2 95 %.  Intake/Output Summary (Last 24 hours) at 04/28/2020 1114 Last data filed at 04/28/2020 0837 Gross per 24 hour  Intake 660 ml  Output --  Net 660 ml   Filed Weights   04/21/20 1836 04/22/20 1621 04/27/20 0400  Weight: 79.8 kg 83.2 kg 98.2 kg    Exam: Awake Alert, Oriented *3, No new F.N deficits, Normal affect Smiley.AT,PERRAL Supple Neck,No JVD, No cervical lymphadenopathy appriciated.  Symmetrical Chest wall movement, Good air movement bilaterally, CTAB RRR,No Gallops,Rubs or new Murmurs, No Parasternal Heave +ve B.Sounds, Abd Soft, Non tender, No organomegaly appriciated, No rebound -guarding or rigidity. No Cyanosis, Clubbing or edema, No new Rash or bruise   PERTINENT RADIOLOGIC STUDIES: DG Abd 1 View  Result Date: 04/22/2020 CLINICAL DATA:  MRI clearance.  Altered mental status. EXAM: ABDOMEN - 1 VIEW COMPARISON:  None. FINDINGS: The bowel gas pattern is normal. No radio-opaque calculi or other significant radiographic abnormality are seen. There appear to be hazy airspace opacities involving the lungs bilaterally. No metallic foreign body identified. There are degenerative changes of both hips, right worse than left. There are degenerative changes of the lumbar spine. IMPRESSION: 1. No metallic foreign body identified. 2. Hazy airspace opacities involving the lungs bilaterally. 3. Nonobstructive bowel gas pattern. Electronically Signed   By:  Katherine Mantle M.D.   On: 04/22/2020 00:23   CT Head Wo Contrast  Result Date: 04/21/2020 CLINICAL DATA:  79 year old female with intermittent dysarthria and left side weakness x1 week, progressive since 1400 hours on 04/20/2020. Hypoxia on initial presentation. EXAM: CT HEAD WITHOUT CONTRAST TECHNIQUE: Contiguous axial images were obtained from the base of the skull through the vertex without intravenous contrast. COMPARISON:  None. FINDINGS: Brain: Small chronic infarct in the left cerebellum (series 3, image 8). No acute intracranial hemorrhage identified. No midline shift, mass effect, or evidence of intracranial mass lesion. No ventriculomegaly. Mild for age scattered bilateral white matter hypodensity. Small age indeterminate hypodense area in the posterior right insula on series 3, image 15. No other no acute or subacute cortically based infarct identified. No supratentorial cortical encephalomalacia identified. Deep gray matter nuclei and brainstem appear within normal limits. Vascular: Calcified atherosclerosis at the skull base. No suspicious intracranial vascular hyperdensity. Skull: No acute osseous abnormality identified. Sinuses/Orbits: Small fluid level in the left maxillary sinus. Bubbly opacity there as well as in the right maxillary and sphenoid sinuses. Bubbly opacity in the posterior left ethmoid. Tympanic cavities and mastoids appear clear. Other: No acute orbit or scalp soft tissue finding identified. IMPRESSION: 1. Small infarct at the posterior right insula, age indeterminate but suspicious for subacute ischemia in this setting. No associated hemorrhage or mass effect. 2. Elsewhere mild for age cerebral white matter changes, and a small chronic left cerebellar infarct. 3. Acute paranasal sinus inflammation. Electronically Signed   By: Odessa Fleming M.D.   On: 04/21/2020 22:56   MR ANGIO HEAD WO CONTRAST  Result Date: 04/22/2020 CLINICAL DATA:  Follow-up examination for acute stroke.  EXAM: MRI HEAD WITHOUT CONTRAST MRA HEAD WITHOUT CONTRAST TECHNIQUE: Multiplanar, multiecho pulse sequences of the brain and surrounding structures were obtained without intravenous contrast. Angiographic images of the head were obtained using MRA technique without contrast. COMPARISON:  Prior CT from 04/21/2020. FINDINGS: MRI HEAD FINDINGS Brain: Examination degraded by motion artifact. Generalized age-related cerebral atrophy. Mild chronic microvascular ischemic disease present within the periventricular and deep white matter both cerebral hemispheres. Small remote left cerebellar infarct noted. Patchy restricted diffusion seen involving the posterior right insula as well as the overlying right posterior frontal and parietal lobes, consistent with right MCA territory infarcts. No significant mass effect or associated  hemorrhage. An embolic etiology is suspected given distribution. No other diffusion abnormality to suggest acute or subacute ischemia. Gray-white matter differentiation otherwise maintained. No other areas of encephalomalacia to suggest chronic cortical infarction on this motion degraded exam. No other definite foci of susceptibility artifact to suggest acute or chronic intracranial hemorrhage. No mass lesion, midline shift or mass effect. No hydrocephalus or extra-axial fluid collection. No made of a partially empty sella. Midline structures intact. Vascular: Major intracranial vascular flow voids are maintained. Skull and upper cervical spine: Craniocervical junction within normal limits. Multilevel cervical spondylosis noted within the upper cervical spine without high-grade stenosis. Bone marrow signal intensity normal. No scalp soft tissue abnormality. Sinuses/Orbits: Globes and orbital soft tissues within normal limits. Scattered mucosal thickening noted throughout the ethmoidal air cells and maxillary sinuses. Superimposed air-fluid level noted within the left maxillary sinus. Trace bilateral  mastoid effusions. Visualized nasopharynx within normal limits. Inner ear structures grossly normal. Other: None. MRA HEAD FINDINGS ANTERIOR CIRCULATION: Examination somewhat degraded by motion artifact. Visualized distal cervical segments of the internal carotid arteries are widely patent with antegrade flow. Petrous, cavernous, and supraclinoid ICAs demonstrate probable atheromatous irregularity without appreciable hemodynamically significant stenosis. A1 segments patent bilaterally. Normal anterior communicating artery complex. Anterior cerebral arteries patent to their distal aspects without stenosis. No M1 stenosis or occlusion. Normal MCA bifurcations. Distal MCA branches well perfused and fairly symmetric. POSTERIOR CIRCULATION: Vertebral arteries patent to the vertebrobasilar junction without stenosis. Both PICAs patent proximally. Basilar patent to its distal aspect without stenosis. Superior cerebral arteries patent bilaterally. Right PCA supplied via a hypoplastic right P1 segment and prominent right posterior communicating artery. Fetal type origin left PCA. Both PCAs perfused to their distal aspects without proximal flow-limiting stenosis. No aneurysm. IMPRESSION: MRI HEAD IMPRESSION: 1. 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 An embolic etiology is suspected. No significant mass effect. 2. Underlying age-related cerebral atrophy with mild chronic small vessel ischemic disease. Small remote left cerebellar infarct. MRA HEAD IMPRESSION: 1. Technically limited exam due to motion artifact. 2. Negative intracranial MRA for large vessel occlusion. No hemodynamically significant or correctable stenosis. 3. Mild atheromatous irregularity about the carotid siphons without significant stenosis. Electronically Signed   By: Rise MuBenjamin  McClintock M.D.   On: 04/22/2020 01:29   MR BRAIN WO  CONTRAST  Result Date: 04/22/2020 CLINICAL DATA:  Follow-up examination for acute stroke. EXAM: MRI HEAD WITHOUT CONTRAST MRA HEAD WITHOUT CONTRAST TECHNIQUE: Multiplanar, multiecho pulse sequences of the brain and surrounding structures were obtained without intravenous contrast. Angiographic images of the head were obtained using MRA technique without contrast. COMPARISON:  Prior CT from 04/21/2020. FINDINGS: MRI HEAD FINDINGS Brain: Examination degraded by motion artifact. Generalized age-related cerebral atrophy. Mild chronic microvascular ischemic disease present within the periventricular and deep white matter both cerebral hemispheres. Small remote left cerebellar infarct noted. Patchy restricted diffusion seen involving the posterior right insula as well as the overlying right posterior frontal and parietal lobes, consistent with right MCA territory infarcts. No significant mass effect or associated hemorrhage. An embolic etiology is suspected given distribution. No other diffusion abnormality to suggest acute or subacute ischemia. Gray-white matter differentiation otherwise maintained. No other areas of encephalomalacia to suggest chronic cortical infarction on this motion degraded exam. No other definite foci of susceptibility artifact to suggest acute or chronic intracranial hemorrhage. No mass lesion, midline shift or mass effect. No hydrocephalus or extra-axial fluid collection. No made of a  partially empty sella. Midline structures intact. Vascular: Major intracranial vascular flow voids are maintained. Skull and upper cervical spine: Craniocervical junction within normal limits. Multilevel cervical spondylosis noted within the upper cervical spine without high-grade stenosis. Bone marrow signal intensity normal. No scalp soft tissue abnormality. Sinuses/Orbits: Globes and orbital soft tissues within normal limits. Scattered mucosal thickening noted throughout the ethmoidal air cells and maxillary  sinuses. Superimposed air-fluid level noted within the left maxillary sinus. Trace bilateral mastoid effusions. Visualized nasopharynx within normal limits. Inner ear structures grossly normal. Other: None. MRA HEAD FINDINGS ANTERIOR CIRCULATION: Examination somewhat degraded by motion artifact. Visualized distal cervical segments of the internal carotid arteries are widely patent with antegrade flow. Petrous, cavernous, and supraclinoid ICAs demonstrate probable atheromatous irregularity without appreciable hemodynamically significant stenosis. A1 segments patent bilaterally. Normal anterior communicating artery complex. Anterior cerebral arteries patent to their distal aspects without stenosis. No M1 stenosis or occlusion. Normal MCA bifurcations. Distal MCA branches well perfused and fairly symmetric. POSTERIOR CIRCULATION: Vertebral arteries patent to the vertebrobasilar junction without stenosis. Both PICAs patent proximally. Basilar patent to its distal aspect without stenosis. Superior cerebral arteries patent bilaterally. Right PCA supplied via a hypoplastic right P1 segment and prominent right posterior communicating artery. Fetal type origin left PCA. Both PCAs perfused to their distal aspects without proximal flow-limiting stenosis. No aneurysm. IMPRESSION: MRI HEAD IMPRESSION: 1. 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 An embolic etiology is suspected. No significant mass effect. 2. Underlying age-related cerebral atrophy with mild chronic small vessel ischemic disease. Small remote left cerebellar infarct. MRA HEAD IMPRESSION: 1. Technically limited exam due to motion artifact. 2. Negative intracranial MRA for large vessel occlusion. No hemodynamically significant or correctable stenosis. 3. Mild atheromatous irregularity about the carotid siphons without significant stenosis.  Electronically Signed   By: Rise Mu M.D.   On: 04/22/2020 01:29   DG Chest Portable 1 View  Result Date: 04/21/2020 CLINICAL DATA:  Hypoxia EXAM: PORTABLE CHEST 1 VIEW COMPARISON:  None. FINDINGS: The heart size is enlarged. There are hazy bilateral airspace opacities. There is no pneumothorax. No large pleural effusion. There are end-stage degenerative changes of both glenohumeral joints. IMPRESSION: Cardiomegaly with hazy bilateral airspace opacities concerning for pulmonary edema or an atypical infectious process. Electronically Signed   By: Katherine Mantle M.D.   On: 04/21/2020 20:21   ECHOCARDIOGRAM COMPLETE  Result Date: 04/23/2020    ECHOCARDIOGRAM REPORT   Patient Name:   Beverly Howell Date of Exam: 04/23/2020 Medical Rec #:  [id number]     Height:       65.0 in Accession #:    [id number]    Weight:       183.4 lb Date of Birth:  29-May-1941      BSA:          1.907 m Patient Age:    79 years      BP:           151/94 mmHg Patient Gender: F             HR:           79 bpm. Exam Location:  Inpatient Procedure: 2D Echo, Cardiac Doppler and Color Doppler Indications:    Stroke 434.91 / I163.9  History:        Patient has no prior history of Echocardiogram examinations.  Risk Factors:Hypertension and Dyslipidemia. COVID-19 Positive.  Sonographer:    Elmarie Shiley Dance Referring Phys: [id number] ARSHAD N KAKRAKANDY IMPRESSIONS  1. Left ventricular ejection fraction, by estimation, is 60 to 65%. The left ventricle has normal function. The left ventricle has no regional wall motion abnormalities. Left ventricular diastolic parameters are consistent with Grade I diastolic dysfunction (impaired relaxation).  2. Right ventricular systolic function is normal. The right ventricular size is normal. There is normal pulmonary artery systolic pressure. The estimated right ventricular systolic pressure is 27.4 mmHg.  3. The mitral valve is normal in structure. Trivial mitral valve regurgitation. No  evidence of mitral stenosis.  4. The aortic valve is normal in structure. There is moderate calcification of the aortic valve. There is moderate thickening of the aortic valve. Aortic valve regurgitation is trivial. Mild to moderate aortic valve sclerosis/calcification is present, without any evidence of aortic stenosis. Aortic valve mean gradient measures 8.5 mmHg. Aortic valve Vmax measures 2.13 m/s.  5. The inferior vena cava is normal in size with greater than 50% respiratory variability, suggesting right atrial pressure of 3 mmHg. Conclusion(s)/Recommendation(s): No intracardiac source of embolism detected on this transthoracic study. A transesophageal echocardiogram is recommended to exclude cardiac source of embolism if clinically indicated. FINDINGS  Left Ventricle: Left ventricular ejection fraction, by estimation, is 60 to 65%. The left ventricle has normal function. The left ventricle has no regional wall motion abnormalities. The left ventricular internal cavity size was normal in size. There is  no left ventricular hypertrophy. Left ventricular diastolic parameters are consistent with Grade I diastolic dysfunction (impaired relaxation). Right Ventricle: The right ventricular size is normal. No increase in right ventricular wall thickness. Right ventricular systolic function is normal. There is normal pulmonary artery systolic pressure. The tricuspid regurgitant velocity is 2.47 m/s, and  with an assumed right atrial pressure of 3 mmHg, the estimated right ventricular systolic pressure is 27.4 mmHg. Left Atrium: Left atrial size was normal in size. Right Atrium: Right atrial size was normal in size. Pericardium: There is no evidence of pericardial effusion. Mitral Valve: The mitral valve is normal in structure. Trivial mitral valve regurgitation. No evidence of mitral valve stenosis. Tricuspid Valve: The tricuspid valve is normal in structure. Tricuspid valve regurgitation is mild . No evidence of  tricuspid stenosis. Aortic Valve: The aortic valve is normal in structure. There is moderate calcification of the aortic valve. There is moderate thickening of the aortic valve. Aortic valve regurgitation is trivial. Mild to moderate aortic valve sclerosis/calcification is present, without any evidence of aortic stenosis. Aortic valve mean gradient measures 8.5 mmHg. Aortic valve peak gradient measures 18.2 mmHg. Aortic valve area, by VTI measures 1.58 cm. Pulmonic Valve: The pulmonic valve was normal in structure. Pulmonic valve regurgitation is not visualized. No evidence of pulmonic stenosis. Aorta: The aortic root is normal in size and structure. Venous: The inferior vena cava is normal in size with greater than 50% respiratory variability, suggesting right atrial pressure of 3 mmHg. IAS/Shunts: No atrial level shunt detected by color flow Doppler.  LEFT VENTRICLE PLAX 2D LVIDd:         3.90 cm  Diastology LVIDs:         2.72 cm  LV e' medial:    4.35 cm/s LV PW:         0.92 cm  LV E/e' medial:  14.5 LV IVS:        1.15 cm  LV e' lateral:   6.42 cm/s LVOT diam:  2.00 cm  LV E/e' lateral: 9.8 LV SV:         63 LV SV Index:   33 LVOT Area:     3.14 cm  RIGHT VENTRICLE             IVC RV Basal diam:  2.65 cm     IVC diam: 1.81 cm RV S prime:     12.00 cm/s TAPSE (M-mode): 2.0 cm LEFT ATRIUM             Index       RIGHT ATRIUM          Index LA diam:        3.20 cm 1.68 cm/m  RA Area:     9.10 cm LA Vol (A2C):   56.4 ml 29.58 ml/m RA Volume:   14.70 ml 7.71 ml/m LA Vol (A4C):   59.4 ml 31.15 ml/m LA Biplane Vol: 60.1 ml 31.52 ml/m  AORTIC VALVE AV Area (Vmax):    1.49 cm AV Area (Vmean):   1.39 cm AV Area (VTI):     1.58 cm AV Vmax:           213.50 cm/s AV Vmean:          135.000 cm/s AV VTI:            0.395 m AV Peak Grad:      18.2 mmHg AV Mean Grad:      8.5 mmHg LVOT Vmax:         101.40 cm/s LVOT Vmean:        59.550 cm/s LVOT VTI:          0.199 m LVOT/AV VTI ratio: 0.50  AORTA Ao Root diam:  2.80 cm Ao Asc diam:  3.40 cm MITRAL VALVE                TRICUSPID VALVE MV Area (PHT): 2.45 cm     TR Peak grad:   24.4 mmHg MV Decel Time: 310 msec     TR Vmax:        247.00 cm/s MV E velocity: 62.90 cm/s MV A velocity: 103.00 cm/s  SHUNTS MV E/A ratio:  0.61         Systemic VTI:  0.20 m                             Systemic Diam: 2.00 cm Donato Schultz MD Electronically signed by Donato Schultz MD Signature Date/Time: 04/23/2020/2:48:24 PM    Final    VAS US CAROTID (at [facility] and [facility] only)  Result Date: 04/22/2020 Carotid Arterial Duplex Study Indications:       CVA and DVT positive. Risk Factors:      Hypertension. Other Factors:     COVID 19 positive. Comparison Study:  No prior studies. Performing Technologist: Chanda Busing RVT  Examination Guidelines: A complete evaluation includes B-mode imaging, spectral Doppler, color Doppler, and power Doppler as needed of all accessible portions of each vessel. Bilateral testing is considered an integral part of a complete examination. Limited examinations for reoccurring indications may be performed as noted.  Right Carotid Findings: +----------+--------+--------+--------+-----------------------+--------+           PSV cm/sEDV cm/sStenosisPlaque Description     Comments +----------+--------+--------+--------+-----------------------+--------+ CCA Prox  56      5               smooth and heterogenous         +----------+--------+--------+--------+-----------------------+--------+  CCA Distal49      11              smooth and heterogenous         +----------+--------+--------+--------+-----------------------+--------+ ICA Prox  48      14              smooth and heterogenous         +----------+--------+--------+--------+-----------------------+--------+ ICA Distal87      24                                     tortuous +----------+--------+--------+--------+-----------------------+--------+ ECA       59      6                                                +----------+--------+--------+--------+-----------------------+--------+ +----------+--------+-------+--------+-------------------+           PSV cm/sEDV cmsDescribeArm Pressure (mmHG) +----------+--------+-------+--------+-------------------+ [id number]                                         +----------+--------+-------+--------+-------------------+ +---------+--------+--+--------+--+---------+ VertebralPSV cm/s59EDV cm/s13Antegrade +---------+--------+--+--------+--+---------+  Left Carotid Findings: +----------+--------+--------+--------+-----------------------+--------+           PSV cm/sEDV cm/sStenosisPlaque Description     Comments +----------+--------+--------+--------+-----------------------+--------+ CCA Prox  97      19              smooth and heterogenous         +----------+--------+--------+--------+-----------------------+--------+ CCA Distal49      11              smooth and heterogenous         +----------+--------+--------+--------+-----------------------+--------+ ICA Prox  37      11              smooth and heterogenous         +----------+--------+--------+--------+-----------------------+--------+ ICA Distal59      22                                     tortuous +----------+--------+--------+--------+-----------------------+--------+ ECA       44      5                                               +----------+--------+--------+--------+-----------------------+--------+ +----------+--------+--------+--------+-------------------+           PSV cm/sEDV cm/sDescribeArm Pressure (mmHG) +----------+--------+--------+--------+-------------------+ [id number]                                          +----------+--------+--------+--------+-------------------+ +---------+--------+--+--------+--+---------+ VertebralPSV cm/s33EDV cm/s10Antegrade +---------+--------+--+--------+--+---------+    Summary: Right Carotid: Velocities in the right ICA are consistent with a 1-39% stenosis. Left Carotid: Velocities in the left ICA are consistent with a 1-39% stenosis. Vertebrals: Bilateral vertebral arteries demonstrate antegrade flow. *See table(s) above for measurements and observations.  Electronically signed by Delia Heady MD on 04/22/2020 at 1:17:39 PM.    Final  VAS [place] LOWER EXTREMITY VENOUS (DVT)  Result Date: 04/22/2020  Lower Venous DVT Study Indications: Elevated Ddimer.  Risk Factors: COVID 19 positive. Comparison Study: No prior studies. Performing Technologist: Chanda Busing RVT  Examination Guidelines: A complete evaluation includes B-mode imaging, spectral Doppler, color Doppler, and power Doppler as needed of all accessible portions of each vessel. Bilateral testing is considered an integral part of a complete examination. Limited examinations for reoccurring indications may be performed as noted. The reflux portion of the exam is performed with the patient in reverse Trendelenburg.  +---------+---------------+---------+-----------+----------+-----------------+ RIGHT    CompressibilityPhasicitySpontaneityPropertiesThrombus Aging    +---------+---------------+---------+-----------+----------+-----------------+ CFV      Full           Yes      Yes                                    +---------+---------------+---------+-----------+----------+-----------------+ SFJ      Full                                                           +---------+---------------+---------+-----------+----------+-----------------+ FV Prox  Full                                                           +---------+---------------+---------+-----------+----------+-----------------+ FV Mid   Partial        No       No                   Age Indeterminate +---------+---------------+---------+-----------+----------+-----------------+ FV DistalNone           No       No                    Age Indeterminate +---------+---------------+---------+-----------+----------+-----------------+ PFV      Full                                                           +---------+---------------+---------+-----------+----------+-----------------+ POP      None           No       No                   Age Indeterminate +---------+---------------+---------+-----------+----------+-----------------+ PTV      None                                         Age Indeterminate +---------+---------------+---------+-----------+----------+-----------------+ PERO     None                                         Age Indeterminate +---------+---------------+---------+-----------+----------+-----------------+ Gastroc  None  Age Indeterminate +---------+---------------+---------+-----------+----------+-----------------+   +---------+---------------+---------+-----------+----------+--------------+ LEFT     CompressibilityPhasicitySpontaneityPropertiesThrombus Aging +---------+---------------+---------+-----------+----------+--------------+ CFV      Full           Yes      Yes                                 +---------+---------------+---------+-----------+----------+--------------+ SFJ      Full                                                        +---------+---------------+---------+-----------+----------+--------------+ FV Prox  Full                                                        +---------+---------------+---------+-----------+----------+--------------+ FV Mid   Full                                                        +---------+---------------+---------+-----------+----------+--------------+ FV DistalFull                                                        +---------+---------------+---------+-----------+----------+--------------+ PFV      Full                                                         +---------+---------------+---------+-----------+----------+--------------+ POP      None           No       No                   Acute          +---------+---------------+---------+-----------+----------+--------------+ PTV      None                                         Acute          +---------+---------------+---------+-----------+----------+--------------+ PERO     None                                         Acute          +---------+---------------+---------+-----------+----------+--------------+    Summary: RIGHT: - Findings consistent with age indeterminate deep vein thrombosis involving the right femoral vein, right popliteal vein, right posterior tibial veins, right peroneal veins, and right gastrocnemius veins. - No cystic structure found in the popliteal fossa.  LEFT: - Findings consistent with acute deep vein thrombosis involving the  left popliteal vein, left posterior tibial veins, and left peroneal veins. - No cystic structure found in the popliteal fossa.  *See table(s) above for measurements and observations. Electronically signed by Gretta Began MD on 04/22/2020 at 2:50:20 PM.    Final      PERTINENT LAB RESULTS: CBC: Recent Labs    04/27/20 0713 04/28/20 0604  WBC 23.6* 22.6*  HGB 12.5 12.8  HCT 38.9 40.3  PLT 302 299   CMET CMP     Component Value Date/Time   NA 136 04/28/2020 0231   K 5.4 (H) 04/28/2020 0231   CL 104 04/28/2020 0231   CO2 18 (L) 04/28/2020 0231   GLUCOSE 155 (H) 04/28/2020 0231   BUN 32 (H) 04/28/2020 0231   CREATININE 1.13 (H) 04/28/2020 0231   CALCIUM 8.1 (L) 04/28/2020 0231   PROT 5.4 (L) 04/27/2020 0400   ALBUMIN 2.5 (L) 04/27/2020 0400   AST 25 04/27/2020 0400   ALT 18 04/27/2020 0400   ALKPHOS 77 04/27/2020 0400   BILITOT 1.2 04/27/2020 0400   GFRNONAA 50 (L) 04/28/2020 0231   GFRAA 44 (L) 03/26/2017 0047    GFR Estimated Creatinine Clearance: 47.6 mL/min (A) (by C-G formula based on SCr of 1.13 mg/dL  (H)). No results for input(s): LIPASE, AMYLASE in the last 72 hours. No results for input(s): CKTOTAL, CKMB, CKMBINDEX, TROPONINI in the last 72 hours. Invalid input(s): POCBNP Recent Labs    04/26/20 0153 04/27/20 0400  DDIMER 17.76* 15.78*   No results for input(s): HGBA1C in the last 72 hours. No results for input(s): CHOL, HDL, LDLCALC, TRIG, CHOLHDL, LDLDIRECT in the last 72 hours. No results for input(s): TSH, T4TOTAL, T3FREE, THYROIDAB in the last 72 hours.  Invalid input(s): FREET3 No results for input(s): VITAMINB12, FOLATE, FERRITIN, TIBC, IRON, RETICCTPCT in the last 72 hours. Coags: No results for input(s): INR in the last 72 hours.  Invalid input(s): PT Microbiology: Recent Results (from the past 240 hour(s))  Respiratory Panel by RT PCR (Flu A&B, Covid) - Nasopharyngeal Swab     Status: Abnormal   Collection Time: 04/21/20  6:41 PM   Specimen: Nasopharyngeal Swab  Result Value Ref Range Status   SARS Coronavirus 2 by RT PCR POSITIVE (A) NEGATIVE Final    Comment: RESULT CALLED TO, READ BACK BY AND VERIFIED WITH: J FERRAIOLO 04/22/20 AT 0016 SK (NOTE) SARS-CoV-2 target nucleic acids are DETECTED.  SARS-CoV-2 RNA is generally detectable in upper respiratory specimens  during the acute phase of infection. Positive results are indicative of the presence of the identified virus, but do not rule out bacterial infection or co-infection with other pathogens not detected by the test. Clinical correlation with patient history and other diagnostic information is necessary to determine patient infection status. The expected result is Negative.  Fact Sheet for Patients:  https://www.moore.com/  Fact Sheet for Healthcare Providers: https://www.young.biz/  This test is not yet approved or cleared by the [place] FDA and  has been authorized for detection and/or diagnosis of SARS-CoV-2 by FDA under an Emergency Use Authorization  (EUA).  This EUA will remain in effect (meaning this test can be use d) for the duration of  the COVID-19 declaration under Section 564(b)(1) of the Act, 21 U.S.C. section 360bbb-3(b)(1), unless the authorization is terminated or revoked sooner.      Influenza A by PCR NEGATIVE NEGATIVE Final   Influenza B by PCR NEGATIVE NEGATIVE Final    Comment: (NOTE) The Xpert Xpress SARS-CoV-2/FLU/RSV assay is intended as  an aid in  the diagnosis of influenza from Nasopharyngeal swab specimens and  should not be used as a sole basis for treatment. Nasal washings and  aspirates are unacceptable for Xpert Xpress SARS-CoV-2/FLU/RSV  testing.  Fact Sheet for Patients: https://www.moore.com/  Fact Sheet for Healthcare Providers: https://www.young.biz/  This test is not yet approved or cleared by the [place] FDA and  has been authorized for detection and/or diagnosis of SARS-CoV-2 by  FDA under an Emergency Use Authorization (EUA). This EUA will remain  in effect (meaning this test can be used) for the duration of the  Covid-19 declaration under Section 564(b)(1) of the Act, 21  U.S.C. section 360bbb-3(b)(1), unless the authorization is  terminated or revoked. Performed at [facility], [address] [place], [place] [address]   Blood culture (routine x 2)     Status: None   Collection Time: 04/21/20  7:50 PM   Specimen: BLOOD  Result Value Ref Range Status   Specimen Description BLOOD SITE NOT SPECIFIED  Final   Special Requests   Final    BOTTLES DRAWN AEROBIC AND ANAEROBIC Blood Culture adequate volume   Culture   Final    NO GROWTH 5 DAYS Performed at [facility], [address], [place], [place] [address]    Report Status 04/26/2020 FINAL  Final  Blood culture (routine x 2)     Status: None   Collection Time: 04/21/20  9:10 PM   Specimen: BLOOD  Result Value Ref Range Status   Specimen Description BLOOD SITE NOT SPECIFIED   Final   Special Requests   Final    BOTTLES DRAWN AEROBIC AND ANAEROBIC Blood Culture results may not be optimal due to an inadequate volume of blood received in culture bottles   Culture   Final    NO GROWTH 5 DAYS Performed at [facility], [address] [place], [place] [address]    Report Status 04/26/2020 FINAL  Final    FURTHER DISCHARGE INSTRUCTIONS:  Get Medicines reviewed and adjusted: Please take all your medications with you for your next visit with your Primary MD  Laboratory/radiological data: Please request your Primary MD to go over all hospital tests and procedure/radiological results at the follow up, please ask your Primary MD to get all Hospital records sent to his/her office.  In some cases, they will be blood work, cultures and biopsy results pending at the time of your discharge. Please request that your primary care M.D. goes through all the records of your hospital data and follows up on these results.  Also Note the following: If you experience worsening of your admission symptoms, develop shortness of breath, life threatening emergency, suicidal or homicidal thoughts you must seek medical attention immediately by calling 911 or calling your MD immediately  if symptoms less severe.  You must read complete instructions/literature along with all the possible adverse reactions/side effects for all the Medicines you take and that have been prescribed to you. Take any new Medicines after you have completely understood and accpet all the possible adverse reactions/side effects.   Do not drive when taking Pain medications or sleeping medications (Benzodaizepines)  Do not take more than prescribed Pain, Sleep and Anxiety Medications. It is not advisable to combine anxiety,sleep and pain medications without talking with your primary care practitioner  Special Instructions: If you have smoked or chewed Tobacco  in the last 2 yrs please stop smoking, stop any  regular Alcohol  and  or any Recreational drug use.  Wear Seat belts while driving.  Please note: You were cared for by a hospitalist during your hospital stay. Once you are discharged, your primary care physician will handle any further medical issues. Please note that NO REFILLS for any discharge medications will be authorized once you are discharged, as it is imperative that you return to your primary care physician (or establish a relationship with a primary care physician if you do not have one) for your post hospital discharge needs so that they can reassess your need for medications and monitor your lab values.  Total Time spent coordinating discharge including counseling, education and face to face time equals 35 minutes.  SignedJeoffrey Massed 04/28/2020 11:14 AM

## 2020-04-28 NOTE — TOC Progression Note (Signed)
Transition of Care [facility]) - Progression Note    Patient Details  Name: Beverly Howell MRN: [id number] Date of Birth: Sep 03, 1940  Transition of Care [facility]) CM/SW Contact  Mearl Latin, LCSW Phone Number: 04/28/2020, 10:59 AM  Clinical Narrative:    Beverly Howell has received insurance approval for patient to discharge today. CSW updated patient's daughter.   Expected Discharge Plan: Skilled Nursing Facility Barriers to Discharge: Barriers Resolved  Expected Discharge Plan and Services Expected Discharge Plan: Skilled Nursing Facility In-house Referral: Clinical Social Work Discharge Planning Services: CM Consult   Living arrangements for the past 2 months: Apartment                                       Social Determinants of Health (SDOH) Interventions    Readmission Risk Interventions No flowsheet data found.

## 2020-04-28 NOTE — TOC Transition Note (Signed)
Transition of Care [facility]) - CM/SW Discharge Note   Patient Details  Name: Norma Montemurro MRN: [id number] Date of Birth: January 18, 1941  Transition of Care [facility]) CM/SW Contact:  Mearl Latin, LCSW Phone Number: 04/28/2020, 11:17 AM   Clinical Narrative:    Patient will DC to: [facility] Anticipated DC date: 04/28/20 Family notified: [name], daughter Transport by: Hermina Barters   Per MD patient ready for DC to [facility]. RN to call report prior to discharge [phone] Room 806B). RN, patient, patient's family, and facility notified of DC. Discharge Summary and FL2 sent to facility. DC packet on chart. Ambulance transport requested for patient.   CSW will sign off for now as social work intervention is no longer needed. Please consult [place] again if new needs arise.      Final next level of care: Skilled Nursing Facility Barriers to Discharge: Barriers Resolved   Patient Goals and CMS Choice Patient states their goals for this hospitalization and ongoing recovery are:: Rehab CMS Medicare.gov Compare Post Acute Care list provided to:: Patient Represenative (must comment) (Daughter) Choice offered to / list presented to : Adult Children, Patient  Discharge Placement   Existing PASRR number confirmed : 04/28/20          Patient chooses bed at: [facility] Patient to be transferred to facility by: [facility] Name of family member notified: Daughter Patient and family notified of of transfer: 04/28/20  Discharge Plan and Services In-house Referral: Clinical Social Work Discharge Planning Services: CM Consult                                 Social Determinants of Health (SDOH) Interventions     Readmission Risk Interventions No flowsheet data found.

## 2020-04-28 NOTE — Progress Notes (Addendum)
Report called to [facility], @ [phone], spoke  to Nurse [facility]

## 2020-04-28 NOTE — Progress Notes (Signed)
Physical Therapy Treatment [name] MRN: [id number] DOB: 20-Jul-1940 Today's Date: 04/28/2020    History of Present Illness Pt is 79 yo female with PMH of HTN.  She presented with L sided weakness and SOB. She was found to have acute R MCA ischemic CVA , bil LE DVT, and severe hypoxic resp failure due to COVID 19 PNA.    PT Comments    Patient resting on 2L with sats 91%. Patient mobilizing better (less physical assist and fewer rest breaks needed), however continues to desaturate into 70s and HR up to 130s. She does not become anxious and slowly (over 6 minutes with increasing O2 to 6L) recovers. She has great difficulty trying to slow her breathing or trying to use pursed lip breathing.   2L rest 91% HR 102 2L walk 78% HR 122 seated rest 4L up to only 83% after 4 minutes;  incr to 6L rest 90%;  6L walk 80% HR 138 bpm seated rest 6L up to 88% after 1 min 4L rest 89% HR 110;  End of session remains on 4L sats 89-91%      Follow Up Recommendations  SNF (pt progressing well and will not be off airborne prec in time to go to CIR)     Equipment Recommendations  Rolling walker with 5" wheels;3in1 (PT) (further assessment next venue)    Recommendations for Other Services       Precautions / Restrictions Precautions Precautions: Fall Precaution Comments: watch O2 and HR    Mobility  Bed Mobility               General bed mobility comments: In recliner upon arrival  Transfers Overall transfer level: Needs assistance Equipment used: Rolling walker (2 wheeled) Transfers: Sit to/from Stand Sit to Stand: Supervision            Ambulation/Gait Ambulation/Gait assistance: Min guard Gait Distance (Feet): 18 Feet (toileted, 18) Assistive device: Rolling walker (2 wheeled) Gait Pattern/deviations: Step-to pattern;Decreased stride length;Shuffle;Trunk flexed Gait velocity: decreased   General Gait Details: Able to ambulate to bathroom and  back with seated rest and increasing O2 from 2>4>6L for recovery x 6 minutes   Stairs             Wheelchair Mobility    Modified Rankin (Stroke Patients Only)       Balance Overall balance assessment: Needs assistance Sitting-balance support: No upper extremity supported Sitting balance-Leahy Scale: Good     Standing balance support: No upper extremity supported Standing balance-Leahy Scale: Fair Standing balance comment: Able to stand without UE support on RW; uses support for dynamic balance                            Cognition Arousal/Alertness: Awake/alert Behavior During Therapy: WFL for tasks assessed/performed                     Orientation Level:  (NT)     Following Commands: Follows one step commands consistently;Follows multi-step commands with increased time   Awareness: Emergent Problem Solving: Requires verbal cues General Comments: has difficulty slowing her breathing with PLB (difficulty sequencing and then tends to breath-hold)      Exercises General Exercises - Lower Extremity Ankle Circles/Pumps: AROM;Both;10 reps Other Exercises Other Exercises: educated and practiced PLB prior to standing/ambulating; pt has great difficulty despite modeling and cuing    General Comments  Pertinent Vitals/Pain Pain Assessment: No/denies pain    Home Living                      Prior Function            PT Goals (current goals can now be found in the care plan section) Acute Rehab PT Goals Patient Stated Goal: get stronger Time For Goal Achievement: 05/07/20 Potential to Achieve Goals: Good Progress towards PT goals: Progressing toward goals    Frequency    Min 4X/week      PT Plan Discharge plan needs to be updated    Co-evaluation              AM-PAC PT "6 Clicks" Mobility   Outcome Measure  Help needed turning from your back to your side while in a flat bed without using bedrails?: A  Lot Help needed moving from lying on your back to sitting on the side of a flat bed without using bedrails?: A Lot Help needed moving to and from a bed to a chair (including a wheelchair)?: A Little Help needed standing up from a chair using your arms (e.g., wheelchair or bedside chair)?: A Little Help needed to walk in hospital room?: A Little Help needed climbing 3-5 steps with a railing? : A Lot 6 Click Score: 15    End of Session Equipment Utilized During Treatment: Gait belt;Oxygen Activity Tolerance: Patient limited by fatigue (elevated HR ) Patient left: with chair alarm set;in chair;with call bell/phone within reach Nurse Communication: Mobility status;Other (comment) (HR) PT Visit Diagnosis: Unsteadiness on feet (R26.81);Muscle weakness (generalized) (M62.81);Hemiplegia and hemiparesis Hemiplegia - Right/Left: Left Hemiplegia - dominant/non-dominant: Non-dominant Hemiplegia - caused by: Cerebral infarction     Time: 2482-5003 PT Time Calculation (min) (ACUTE ONLY): 37 min  Charges:  $Gait Training: 8-22 mins $Self Care/Home Management: 8-22                      Jerolyn Center, PT Pager [phone]    Zena Amos 04/28/2020, 10:56 AM

## 2020-05-19 ENCOUNTER — Emergency Department (HOSPITAL_COMMUNITY): Payer: Medicare (Managed Care)

## 2020-05-19 ENCOUNTER — Inpatient Hospital Stay (HOSPITAL_COMMUNITY)
Admission: EM | Admit: 2020-05-19 | Discharge: 2020-06-06 | DRG: 177 | Disposition: A | Discharge status: Home Health | Payer: Medicare (Managed Care) | Source: Skilled Nursing Facility | Attending: Family Medicine | Admitting: Family Medicine

## 2020-05-19 ENCOUNTER — Emergency Department (HOSPITAL_COMMUNITY)
Admission: EM | Admit: 2020-05-19 | Discharge: 2020-05-19 | Disposition: A | Discharge status: Home / Self Care | Payer: Self-pay | Source: Home / Self Care

## 2020-05-19 DIAGNOSIS — U099 Post covid-19 condition, unspecified: Secondary | ICD-10-CM | POA: Diagnosis present

## 2020-05-19 DIAGNOSIS — R0602 Shortness of breath: Secondary | ICD-10-CM

## 2020-05-19 DIAGNOSIS — R5381 Other malaise: Secondary | ICD-10-CM | POA: Diagnosis not present

## 2020-05-19 DIAGNOSIS — R042 Hemoptysis: Secondary | ICD-10-CM | POA: Diagnosis present

## 2020-05-19 DIAGNOSIS — J9383 Other pneumothorax: Secondary | ICD-10-CM | POA: Diagnosis present

## 2020-05-19 DIAGNOSIS — D6869 Other thrombophilia: Secondary | ICD-10-CM | POA: Diagnosis present

## 2020-05-19 DIAGNOSIS — E785 Hyperlipidemia, unspecified: Secondary | ICD-10-CM | POA: Diagnosis present

## 2020-05-19 DIAGNOSIS — I10 Essential (primary) hypertension: Secondary | ICD-10-CM | POA: Diagnosis present

## 2020-05-19 DIAGNOSIS — J850 Gangrene and necrosis of lung: Principal | ICD-10-CM | POA: Diagnosis present

## 2020-05-19 DIAGNOSIS — Z9689 Presence of other specified functional implants: Secondary | ICD-10-CM

## 2020-05-19 DIAGNOSIS — Z8616 Personal history of COVID-19: Secondary | ICD-10-CM

## 2020-05-19 DIAGNOSIS — Z8673 Personal history of transient ischemic attack (TIA), and cerebral infarction without residual deficits: Secondary | ICD-10-CM

## 2020-05-19 DIAGNOSIS — J9621 Acute and chronic respiratory failure with hypoxia: Secondary | ICD-10-CM | POA: Diagnosis present

## 2020-05-19 DIAGNOSIS — R0902 Hypoxemia: Secondary | ICD-10-CM

## 2020-05-19 DIAGNOSIS — J948 Other specified pleural conditions: Secondary | ICD-10-CM

## 2020-05-19 DIAGNOSIS — Z823 Family history of stroke: Secondary | ICD-10-CM

## 2020-05-19 DIAGNOSIS — J90 Pleural effusion, not elsewhere classified: Secondary | ICD-10-CM | POA: Diagnosis not present

## 2020-05-19 DIAGNOSIS — Z87891 Personal history of nicotine dependence: Secondary | ICD-10-CM

## 2020-05-19 DIAGNOSIS — D638 Anemia in other chronic diseases classified elsewhere: Secondary | ICD-10-CM | POA: Diagnosis present

## 2020-05-19 DIAGNOSIS — J9811 Atelectasis: Secondary | ICD-10-CM | POA: Diagnosis present

## 2020-05-19 DIAGNOSIS — K920 Hematemesis: Secondary | ICD-10-CM | POA: Diagnosis present

## 2020-05-19 DIAGNOSIS — E876 Hypokalemia: Secondary | ICD-10-CM | POA: Diagnosis not present

## 2020-05-19 DIAGNOSIS — Z79899 Other long term (current) drug therapy: Secondary | ICD-10-CM

## 2020-05-19 DIAGNOSIS — Z86718 Personal history of other venous thrombosis and embolism: Secondary | ICD-10-CM

## 2020-05-19 DIAGNOSIS — Z7901 Long term (current) use of anticoagulants: Secondary | ICD-10-CM

## 2020-05-19 DIAGNOSIS — R0600 Dyspnea, unspecified: Secondary | ICD-10-CM

## 2020-05-19 DIAGNOSIS — T797XXA Traumatic subcutaneous emphysema, initial encounter: Secondary | ICD-10-CM | POA: Diagnosis not present

## 2020-05-19 DIAGNOSIS — Z23 Encounter for immunization: Secondary | ICD-10-CM

## 2020-05-19 DIAGNOSIS — D6859 Other primary thrombophilia: Secondary | ICD-10-CM | POA: Diagnosis present

## 2020-05-19 DIAGNOSIS — Z09 Encounter for follow-up examination after completed treatment for conditions other than malignant neoplasm: Secondary | ICD-10-CM

## 2020-05-19 DIAGNOSIS — E7800 Pure hypercholesterolemia, unspecified: Secondary | ICD-10-CM | POA: Diagnosis present

## 2020-05-19 DIAGNOSIS — I454 Nonspecific intraventricular block: Secondary | ICD-10-CM | POA: Diagnosis present

## 2020-05-19 DIAGNOSIS — J9382 Other air leak: Secondary | ICD-10-CM | POA: Diagnosis not present

## 2020-05-19 DIAGNOSIS — J939 Pneumothorax, unspecified: Secondary | ICD-10-CM

## 2020-05-19 DIAGNOSIS — R000 Tachycardia, unspecified: Secondary | ICD-10-CM | POA: Diagnosis not present

## 2020-05-19 DIAGNOSIS — I69354 Hemiplegia and hemiparesis following cerebral infarction affecting left non-dominant side: Secondary | ICD-10-CM

## 2020-05-19 LAB — CBC WITH DIFFERENTIAL/PLATELET
Abs Immature Granulocytes: 0.16 10*3/uL — ABNORMAL HIGH (ref 0.00–0.07)
Basophils Absolute: 0 10*3/uL (ref 0.0–0.1)
Basophils Relative: 0 %
Eosinophils Absolute: 0.1 10*3/uL (ref 0.0–0.5)
Eosinophils Relative: 0 %
HCT: 40.2 % (ref 36.0–46.0)
Hemoglobin: 12.3 g/dL (ref 12.0–15.0)
Immature Granulocytes: 1 %
Lymphocytes Relative: 12 %
Lymphs Abs: 2.2 10*3/uL (ref 0.7–4.0)
MCH: 31 pg (ref 26.0–34.0)
MCHC: 30.6 g/dL (ref 30.0–36.0)
MCV: 101.3 fL — ABNORMAL HIGH (ref 80.0–100.0)
Monocytes Absolute: 1.8 10*3/uL — ABNORMAL HIGH (ref 0.1–1.0)
Monocytes Relative: 10 %
Neutro Abs: 14.3 10*3/uL — ABNORMAL HIGH (ref 1.7–7.7)
Neutrophils Relative %: 77 %
Platelets: 681 10*3/uL — ABNORMAL HIGH (ref 150–400)
RBC: 3.97 MIL/uL (ref 3.87–5.11)
RDW: 16.7 % — ABNORMAL HIGH (ref 11.5–15.5)
WBC: 18.5 10*3/uL — ABNORMAL HIGH (ref 4.0–10.5)
nRBC: 0 % (ref 0.0–0.2)

## 2020-05-19 LAB — COMPREHENSIVE METABOLIC PANEL
ALT: 29 U/L (ref 0–44)
AST: 26 U/L (ref 15–41)
Albumin: 2.5 g/dL — ABNORMAL LOW (ref 3.5–5.0)
Alkaline Phosphatase: 82 U/L (ref 38–126)
Anion gap: 12 (ref 5–15)
BUN: 13 mg/dL (ref 8–23)
CO2: 27 mmol/L (ref 22–32)
Calcium: 9.2 mg/dL (ref 8.9–10.3)
Chloride: 104 mmol/L (ref 98–111)
Creatinine, Ser: 0.86 mg/dL (ref 0.44–1.00)
GFR, Estimated: >60 mL/min (ref >60)
Glucose, Bld: 99 mg/dL (ref 70–99)
Potassium: 3.7 mmol/L (ref 3.5–5.1)
Sodium: 143 mmol/L (ref 135–145)
Total Bilirubin: 0.7 mg/dL (ref 0.3–1.2)
Total Protein: 6.7 g/dL (ref 6.5–8.1)

## 2020-05-19 LAB — LACTIC ACID, PLASMA
Lactic Acid, Venous: 2 mmol/L (ref 0.5–1.9)
Lactic Acid, Venous: 1.6 mmol/L (ref 0.5–1.9)

## 2020-05-19 LAB — TROPONIN I (HIGH SENSITIVITY): Troponin I (High Sensitivity): 12 ng/L (ref <18)

## 2020-05-19 MED ORDER — IOHEXOL 350 MG/ML SOLN
75.0000 mL | Freq: Once | INTRAVENOUS | Status: AC | PRN
  Administered 2020-05-20: 75 mL via INTRAVENOUS

## 2020-05-19 NOTE — ED Provider Notes (Signed)
[facility] EMERGENCY DEPARTMENT Provider Note   CSN: [id number] Arrival date & time: 05/19/20  1845     History Chief Complaint  Patient presents with  . Shortness of Breath  . Hematemesis    Beverly Howell is a 79 y.o. female.  The history is provided by the patient, the EMS personnel and medical records. No language interpreter was used.  Shortness of Breath Severity:  Severe Onset quality:  Sudden Timing:  Constant Progression:  Unchanged Chronicity:  New Context: URI (recent covid and DVT)   Relieved by:  Oxygen Worsened by:  Nothing Ineffective treatments:  None tried Associated symptoms: chest pain, cough and hemoptysis   Associated symptoms: no abdominal pain, no diaphoresis, no fever, no headaches, no neck pain, no vomiting and no wheezing   Risk factors: hx of PE/DVT        Past Medical History:  Diagnosis Date  . High cholesterol   . Hypertension     Patient Active Problem List   Diagnosis Date Noted  . Acute CVA (cerebrovascular accident) (HCC) 04/22/2020  . Acute respiratory failure due to COVID-19 (HCC) 04/22/2020  . ARF (acute renal failure) (HCC) 04/22/2020  . Essential hypertension 04/22/2020  . Acute respiratory failure (HCC) 04/21/2020    Past Surgical History:  Procedure Laterality Date  . HAND SURGERY       OB History   No obstetric history on file.     Family History  Problem Relation Age of Onset  . Stroke Mother     Social History   Tobacco Use  . Smoking status: Former Games developer  . Smokeless tobacco: Never Used  Substance Use Topics  . Alcohol use: No  . Drug use: No    Home Medications Prior to Admission medications   Medication Sig Start Date End Date Taking? Authorizing Provider  amLODipine (NORVASC) 5 MG tablet Take 1 tablet (5 mg total) by mouth daily. 04/29/20   Ghimire, Werner Lean, MD  apixaban (ELIQUIS) 5 MG TABS tablet Take 2 tablets (10 mg total) by mouth 2 (two) times daily for 4 doses.  04/28/20 04/30/20  Ghimire, Werner Lean, MD  apixaban (ELIQUIS) 5 MG TABS tablet Take 1 tablet (5 mg total) by mouth 2 (two) times daily. Start 11/17 evening. 04/30/20   Ghimire, Werner Lean, MD  atorvastatin (LIPITOR) 40 MG tablet Take 1 tablet (40 mg total) by mouth daily. 04/29/20   Ghimire, Werner Lean, MD  Calcium Carb-Cholecalciferol (CALCIUM 600-D PO) Take 1 tablet by mouth daily.    [provider]  cholecalciferol (VITAMIN D) 25 MCG (1000 UNIT) tablet Take 1,000 Units by mouth daily.     [provider]  metoprolol tartrate (LOPRESSOR) 25 MG tablet Take 2 tablets (50 mg total) by mouth 2 (two) times daily. 04/28/20   Ghimire, Werner Lean, MD  Omega-3 Fatty Acids (FISH OIL TRIPLE STRENGTH) 1400 MG CAPS Take 1,400 mg by mouth daily.    [provider]  pantoprazole (PROTONIX) 40 MG tablet Take 1 tablet (40 mg total) by mouth daily. 04/29/20   Ghimire, Werner Lean, MD  predniSONE (DELTASONE) 10 MG tablet Take 40 mg daily for 1 day, 30 mg daily for 1 day, 20 mg daily for 1 days,10 mg daily for 1 day, then stop 04/28/20   Maretta Bees, MD    Allergies    Patient has no known allergies.  Review of Systems   Review of Systems  Constitutional: Positive for fatigue. Negative for chills, diaphoresis and  fever.  HENT: Negative for congestion.   Eyes: Negative for visual disturbance.  Respiratory: Positive for cough, hemoptysis, chest tightness and shortness of breath. Negative for wheezing and stridor.   Cardiovascular: Positive for chest pain. Negative for palpitations.  Gastrointestinal: Negative for abdominal pain, constipation, diarrhea, nausea and vomiting.  Genitourinary: Negative for dysuria, flank pain and frequency.  Musculoskeletal: Negative for back pain, neck pain and neck stiffness.  Neurological: Negative for dizziness, speech difficulty, weakness, light-headedness and headaches.  Psychiatric/Behavioral: Negative for agitation and confusion.  All other  systems reviewed and are negative.   Physical Exam Updated Vital Signs BP (!) 134/112   Pulse 73   Temp 98.9 F (37.2 C) (Oral)   Resp (!) 28   SpO2 98%   Physical Exam Vitals and nursing note reviewed.  Constitutional:      General: She is not in acute distress.    Appearance: She is well-developed. She is not ill-appearing, toxic-appearing or diaphoretic.  HENT:     Head: Normocephalic and atraumatic.     Right Ear: External ear normal.     Left Ear: External ear normal.     Nose: Nose normal.     Mouth/Throat:     Pharynx: No oropharyngeal exudate.  Eyes:     Conjunctiva/sclera: Conjunctivae normal.     Pupils: Pupils are equal, round, and reactive to light.  Cardiovascular:     Rate and Rhythm: Regular rhythm. Tachycardia present.  Pulmonary:     Effort: Tachypnea present. No respiratory distress.     Breath sounds: No stridor. Examination of the right-upper field reveals decreased breath sounds. Examination of the right-middle field reveals decreased breath sounds. Examination of the right-lower field reveals decreased breath sounds. Decreased breath sounds and rhonchi present. No wheezing.  Chest:     Chest wall: No tenderness.  Abdominal:     General: There is no distension.     Tenderness: There is no abdominal tenderness. There is no rebound.  Musculoskeletal:     Cervical back: Normal range of motion and neck supple.     Right lower leg: No tenderness.     Left lower leg: No tenderness.  Skin:    General: Skin is warm.     Capillary Refill: Capillary refill takes less than 2 seconds.     Coloration: Skin is not pale.     Findings: No erythema or rash.  Neurological:     General: No focal deficit present.     Mental Status: She is alert and oriented to person, place, and time.     Motor: No abnormal muscle tone.     Coordination: Coordination normal.     Deep Tendon Reflexes: Reflexes are normal and symmetric.  Psychiatric:        Mood and Affect: Mood  normal.     ED Results / Procedures / Treatments   Labs (all labs ordered are listed, but only abnormal results are displayed) Labs Reviewed  CBC WITH DIFFERENTIAL/PLATELET - Abnormal; Notable for the following components:      Result Value   WBC 18.5 (*)    MCV 101.3 (*)    RDW 16.7 (*)    Platelets 681 (*)    Neutro Abs 14.3 (*)    Monocytes Absolute 1.8 (*)    Abs Immature Granulocytes 0.16 (*)    All other components within normal limits  COMPREHENSIVE METABOLIC PANEL - Abnormal; Notable for the following components:   Albumin 2.5 (*)    All  other components within normal limits  LACTIC ACID, PLASMA - Abnormal; Notable for the following components:   Lactic Acid, Venous 2.0 (*)    All other components within normal limits  LACTIC ACID, PLASMA  TROPONIN I (HIGH SENSITIVITY)  TROPONIN I (HIGH SENSITIVITY)    EKG EKG Interpretation  Date/Time:  Monday May 19 2020 18:56:08 EST Ventricular Rate:  71 PR Interval:    QRS Duration: 138 QT Interval:  415 QTC Calculation: 451 R Axis:   63 Text Interpretation: Sinus rhythm Atrial premature complex Right bundle branch block When compared to prior, similar apperance. No STEMI Confirmed by Theda Belfast ([id number]) on 05/19/2020 7:02:06 PM   Radiology DG Chest Portable 1 View  Result Date: 05/19/2020 CLINICAL DATA:  Sudden onset short of breath with hemoptysis EXAM: PORTABLE CHEST 1 VIEW COMPARISON:  04/21/2020 FINDINGS: Interval hyper lucent right thorax with meniscus levels at the right base concerning for moderate to large hydropneumothorax. Patchy consolidation and ground-glass density in the left lower lung without significant change. Stable cardiomediastinal silhouette with aortic atherosclerosis. IMPRESSION: 1. Increased hyperlucency of the right thorax with meniscus levels at the right base concerning for moderate to large hydropneumothorax. CT chest recommended for further evaluation. 2. Stable patchy consolidation and  ground-glass density in the left lower lung. Critical Value/emergent results were called by telephone at the time of interpretation on 05/19/2020 at 7:39 pm to provider [facility] , who verbally acknowledged these results. Electronically Signed   By: Jasmine Pang M.D.   On: 05/19/2020 19:39    Procedures Procedures (including critical care time)  EMERGENCY DEPARTMENT  US GUIDANCE EXAM Emergency Ultrasound:  US Guidance for Needle Guidance  INDICATIONS: Difficult vascular access Linear probe used in real-time to visualize location of needle entry through skin.   PERFORMED BY: Myself IMAGES ARCHIVED?: Yes LIMITATIONS: scar tuissue VIEWS USED: Transverse INTERPRETATION: Needle visualized within vein, Right arm and Needle gauge 18  Medications Ordered in ED Medications  iohexol (OMNIPAQUE) 350 MG/ML injection 75 mL ( Intravenous Canceled Entry 05/19/20 2226)    ED Course  I have reviewed the triage vital signs and the nursing notes.  Pertinent labs & imaging results that were available during my care of the patient were reviewed by me and considered in my medical decision making (see chart for details).    MDM Rules/Calculators/A&P                          Beverly Howell is a 79 y.o. female with a past medical history significant for prior stroke, hypertension, recent diagnosis of COVID-19 20 days ago and DVT on Eliquis who presents with sudden onset shortness of breath, hemoptysis, chest tightness, and respiratory difficulties.  Patient reports that she normally takes 2 L but had oxygen saturations around 80% on this this evening.  She had sudden onset shortness of breath during a coughing fit and started coughing up blood.  She reports her pain and shortness breath is feeling much better now that she is on a nonrebreather.  Her oxygen saturations in the 90s on arrival.  Patient is a difficult historian and does not know what she was in rehab for.  She denies any abdominal pain,  back pain, or leg pains at this time.  On arrival, she is not tachycardic but is tachypneic.  She is maintaining oxygen saturations now she is on a nonrebreather at 15 L.  She is afebrile.  On exam, right side is diminished and  x-ray from the facility recently shows either effusion or blebs on the right side.  Chest is nontender and back is nontender.  Good pulses in upper extremities.  Lower extremities have some edema but otherwise had intact sensation and pulses.  Clinically I am concerned about either a large effusion versus pneumothorax versus a large pulmonary embolism given the known DVTs in the patient's legs per the packet arriving with the patient.  We will get a portable chest and a CT PE study now that her vital signs appear more stable on nonrebreather.  Anticipate admission for the increased oxygen requirement when work-up is completed.  We will hold on repeat Covid test as she was positive less than 1 month ago.  Radiology called to report a hydropneumothorax.  She has been weaned down to 4 L nasal cannula and is breathing better.  She is still maintaining oxygen saturations in the 90s.  Given her stable vital signs at this time, we will hold on placing any type of chest tube and get a CT scan which radiology recommended.  Anticipate following up on CT results to further guide management at this time.  9:27 PM I had to place an ultrasound-guided IV as the IV team could not obtain access on the patient.  10:42 PM CT team does reports that the patient's IV stopped working.  We will need to place another ultrasound-guided IV so that she can get her CT PE study to further delineate her lung abnormalities tonight.  A second ultrasound-guided IV was placed by me for the images.  Care transferred to oncoming team awaiting for results of CT scan to help determine disposition.  Anticipate admission for hemoptysis, chest pain, shortness of breath with increased oxygen requirement  tonight   Final Clinical Impression(s) / ED Diagnoses Final diagnoses:  Hemoptysis  Shortness of breath  Hypoxia  Dyspnea, unspecified type    Clinical Impression: 1. Hemoptysis   2. Shortness of breath   3. Hypoxia   4. Dyspnea, unspecified type     Disposition: Admit  This note was prepared with assistance of Dragon voice recognition software. Occasional wrong-word or sound-a-like substitutions may have occurred due to the inherent limitations of voice recognition software.      Merle Whitehorn, Canary Brimhristopher J, MD 05/19/20 [phone]

## 2020-05-19 NOTE — ED Triage Notes (Signed)
Per ems pt coming from [facility] with c/o SOB that started  about 1hour ago. Pt also coughing up blood without clots.  Facility not wanting to give report on the patient. Pt had chest xray done on 11/29 for a follow up of a pleural effusion. Per ems pt is absent on the R side lung. Pt denies any hx of PE's but has hx of DVT. Pt is on blood thinners for afib. Pt has orders for [place] of left arm for DVT concerns. Pt alert and oriented x4.

## 2020-05-19 NOTE — ED Triage Notes (Signed)
Patient bibems from [facility]. Pt c/o sudden onset of SOB 1hour ago. Pt also states she was coughing up blood. Ems states it was bright red with no clots. Facility not whiling to give report on patient. Ems had to gather all of her info from her paper work. 11/29 patient had chest xray for a follow up of her pleural effusion and being covid positive. Unsure if patient is covid positive now. Patient alert and oriented x4.

## 2020-05-19 NOTE — ED Notes (Signed)
This RN unable to get access on patient, as well as IV team. Provider made aware. Unable to get blood work at this time.

## 2020-05-19 NOTE — ED Provider Notes (Signed)
6:43 PM Nursing just reported to me that the patient being evaluated emergency department is not this patient, Ms. Bennett. Please disregard the EKG that is in Ms. Bennett's chart as this was not an ECG of mrs. Bennett.     Waneta Fitting, Canary Brim, MD 05/19/20 [phone]

## 2020-05-19 NOTE — ED Notes (Signed)
Date and time results received: 05/19/20 10:20 PM   (use smartphrase ".now" to insert current time)  Test: lactic acid  Critical Value: 2.0  Name of Provider Notified:tegeler MD   Orders Received? Or Actions Taken?:

## 2020-05-20 ENCOUNTER — Inpatient Hospital Stay (HOSPITAL_COMMUNITY): Payer: Medicare (Managed Care)

## 2020-05-20 ENCOUNTER — Encounter (HOSPITAL_COMMUNITY): Payer: Self-pay | Admitting: Internal Medicine

## 2020-05-20 DIAGNOSIS — K920 Hematemesis: Secondary | ICD-10-CM | POA: Diagnosis present

## 2020-05-20 DIAGNOSIS — J9621 Acute and chronic respiratory failure with hypoxia: Secondary | ICD-10-CM | POA: Diagnosis present

## 2020-05-20 DIAGNOSIS — Z8673 Personal history of transient ischemic attack (TIA), and cerebral infarction without residual deficits: Secondary | ICD-10-CM | POA: Diagnosis not present

## 2020-05-20 DIAGNOSIS — E876 Hypokalemia: Secondary | ICD-10-CM | POA: Diagnosis not present

## 2020-05-20 DIAGNOSIS — J9811 Atelectasis: Secondary | ICD-10-CM | POA: Diagnosis present

## 2020-05-20 DIAGNOSIS — I69354 Hemiplegia and hemiparesis following cerebral infarction affecting left non-dominant side: Secondary | ICD-10-CM | POA: Diagnosis not present

## 2020-05-20 DIAGNOSIS — D6869 Other thrombophilia: Secondary | ICD-10-CM | POA: Diagnosis present

## 2020-05-20 DIAGNOSIS — Z8616 Personal history of COVID-19: Secondary | ICD-10-CM

## 2020-05-20 DIAGNOSIS — I10 Essential (primary) hypertension: Secondary | ICD-10-CM | POA: Diagnosis present

## 2020-05-20 DIAGNOSIS — J948 Other specified pleural conditions: Secondary | ICD-10-CM | POA: Diagnosis present

## 2020-05-20 DIAGNOSIS — Z823 Family history of stroke: Secondary | ICD-10-CM | POA: Diagnosis not present

## 2020-05-20 DIAGNOSIS — R0602 Shortness of breath: Secondary | ICD-10-CM | POA: Diagnosis present

## 2020-05-20 DIAGNOSIS — Z86718 Personal history of other venous thrombosis and embolism: Secondary | ICD-10-CM | POA: Diagnosis not present

## 2020-05-20 DIAGNOSIS — I454 Nonspecific intraventricular block: Secondary | ICD-10-CM | POA: Diagnosis present

## 2020-05-20 DIAGNOSIS — J850 Gangrene and necrosis of lung: Secondary | ICD-10-CM | POA: Diagnosis present

## 2020-05-20 DIAGNOSIS — T797XXA Traumatic subcutaneous emphysema, initial encounter: Secondary | ICD-10-CM | POA: Diagnosis not present

## 2020-05-20 DIAGNOSIS — J9382 Other air leak: Secondary | ICD-10-CM | POA: Diagnosis not present

## 2020-05-20 DIAGNOSIS — Z9689 Presence of other specified functional implants: Secondary | ICD-10-CM | POA: Diagnosis not present

## 2020-05-20 DIAGNOSIS — J90 Pleural effusion, not elsewhere classified: Secondary | ICD-10-CM | POA: Diagnosis not present

## 2020-05-20 DIAGNOSIS — Z87891 Personal history of nicotine dependence: Secondary | ICD-10-CM | POA: Diagnosis not present

## 2020-05-20 DIAGNOSIS — R042 Hemoptysis: Secondary | ICD-10-CM | POA: Diagnosis present

## 2020-05-20 DIAGNOSIS — Z79899 Other long term (current) drug therapy: Secondary | ICD-10-CM | POA: Diagnosis not present

## 2020-05-20 DIAGNOSIS — Z7901 Long term (current) use of anticoagulants: Secondary | ICD-10-CM | POA: Diagnosis not present

## 2020-05-20 DIAGNOSIS — E7800 Pure hypercholesterolemia, unspecified: Secondary | ICD-10-CM | POA: Diagnosis present

## 2020-05-20 DIAGNOSIS — Z23 Encounter for immunization: Secondary | ICD-10-CM | POA: Diagnosis present

## 2020-05-20 DIAGNOSIS — J9383 Other pneumothorax: Secondary | ICD-10-CM | POA: Diagnosis present

## 2020-05-20 DIAGNOSIS — D6859 Other primary thrombophilia: Secondary | ICD-10-CM | POA: Diagnosis present

## 2020-05-20 LAB — APTT: aPTT: 39 seconds — ABNORMAL HIGH (ref 24–36)

## 2020-05-20 LAB — TROPONIN I (HIGH SENSITIVITY): Troponin I (High Sensitivity): 12 ng/L (ref <18)

## 2020-05-20 MED ORDER — ACETAMINOPHEN 650 MG RE SUPP: 650.0000 mg | Freq: Four times a day (QID) | RECTAL | Status: DC | PRN

## 2020-05-20 MED ORDER — SODIUM CHLORIDE 0.9% FLUSH: 10.0000 mL | INTRAVENOUS | Status: DC | PRN

## 2020-05-20 MED ORDER — FENTANYL CITRATE (PF) 100 MCG/2ML IJ SOLN
INTRAMUSCULAR | Status: AC | PRN
Start: 2020-05-20 — End: 2020-05-20
  Administered 2020-05-20 (×3): 50 ug via INTRAVENOUS

## 2020-05-20 MED ORDER — SODIUM CHLORIDE 0.9 % IV SOLN
500.0000 mg | Freq: Every day | INTRAVENOUS | Status: DC
  Administered 2020-05-20 (×2): 500 mg via INTRAVENOUS
  Filled 2020-05-20 (×3): qty 500

## 2020-05-20 MED ORDER — VANCOMYCIN HCL IN DEXTROSE 1-5 GM/200ML-% IV SOLN
1000.0000 mg | Freq: Once | INTRAVENOUS | Status: AC
  Administered 2020-05-20: 1000 mg via INTRAVENOUS
  Filled 2020-05-20: qty 200

## 2020-05-20 MED ORDER — VANCOMYCIN HCL IN DEXTROSE 1-5 GM/200ML-% IV SOLN
1000.0000 mg | Freq: Two times a day (BID) | INTRAVENOUS | Status: DC
  Administered 2020-05-20 – 2020-05-22 (×4): 1000 mg via INTRAVENOUS
  Filled 2020-05-20 (×4): qty 200

## 2020-05-20 MED ORDER — MIDAZOLAM HCL 2 MG/2ML IJ SOLN
INTRAMUSCULAR | Status: AC
  Filled 2020-05-20: qty 4

## 2020-05-20 MED ORDER — METOPROLOL TARTRATE 50 MG PO TABS
50.0000 mg | ORAL_TABLET | Freq: Two times a day (BID) | ORAL | Status: DC
  Administered 2020-05-20 – 2020-06-06 (×34): 50 mg via ORAL
  Filled 2020-05-20 (×12): qty 1
  Filled 2020-05-20: qty 2
  Filled 2020-05-20 (×8): qty 1
  Filled 2020-05-20: qty 2
  Filled 2020-05-20 (×13): qty 1

## 2020-05-20 MED ORDER — SODIUM CHLORIDE 0.9 % IV SOLN
2.0000 g | Freq: Three times a day (TID) | INTRAVENOUS | Status: DC
  Administered 2020-05-20 – 2020-05-23 (×10): 2 g via INTRAVENOUS
  Filled 2020-05-20 (×10): qty 2

## 2020-05-20 MED ORDER — SODIUM CHLORIDE 0.9 % IV SOLN
2.0000 g | Freq: Once | INTRAVENOUS | Status: AC
  Administered 2020-05-20: 2 g via INTRAVENOUS
  Filled 2020-05-20: qty 2

## 2020-05-20 MED ORDER — FENTANYL CITRATE (PF) 100 MCG/2ML IJ SOLN
INTRAMUSCULAR | Status: AC
  Filled 2020-05-20: qty 2

## 2020-05-20 MED ORDER — HEPARIN (PORCINE) 25000 UT/250ML-% IV SOLN
900.0000 [IU]/h | INTRAVENOUS | Status: DC
  Administered 2020-05-20: 900 [IU]/h via INTRAVENOUS
  Filled 2020-05-20: qty 250

## 2020-05-20 MED ORDER — ACETAMINOPHEN 325 MG PO TABS
650.0000 mg | ORAL_TABLET | Freq: Four times a day (QID) | ORAL | Status: DC | PRN
  Administered 2020-05-21 – 2020-05-22 (×2): 650 mg via ORAL
  Filled 2020-05-20 (×2): qty 2

## 2020-05-20 MED ORDER — HEPARIN (PORCINE) 25000 UT/250ML-% IV SOLN
1000.0000 [IU]/h | INTRAVENOUS | Status: DC
  Administered 2020-05-20: 900 [IU]/h via INTRAVENOUS
  Administered 2020-05-22: 1050 [IU]/h via INTRAVENOUS
  Administered 2020-05-23: 1000 [IU]/h via INTRAVENOUS
  Filled 2020-05-20 (×3): qty 250

## 2020-05-20 MED ORDER — MIDAZOLAM HCL 2 MG/2ML IJ SOLN
INTRAMUSCULAR | Status: AC | PRN
  Administered 2020-05-20 (×3): 1 mg via INTRAVENOUS

## 2020-05-20 MED ORDER — IPRATROPIUM-ALBUTEROL 0.5-2.5 (3) MG/3ML IN SOLN
3.0000 mL | Freq: Four times a day (QID) | RESPIRATORY_TRACT | Status: DC
  Administered 2020-05-21 – 2020-05-22 (×7): 3 mL via RESPIRATORY_TRACT
  Filled 2020-05-20 (×8): qty 3

## 2020-05-20 MED ORDER — ATORVASTATIN CALCIUM 40 MG PO TABS
40.0000 mg | ORAL_TABLET | Freq: Every day | ORAL | Status: DC
  Administered 2020-05-20 – 2020-06-06 (×18): 40 mg via ORAL
  Filled 2020-05-20 (×17): qty 1

## 2020-05-20 MED ORDER — VANCOMYCIN HCL 500 MG/100ML IV SOLN
500.0000 mg | Freq: Two times a day (BID) | INTRAVENOUS | Status: DC
  Administered 2020-05-20: 500 mg via INTRAVENOUS
  Filled 2020-05-20 (×2): qty 100

## 2020-05-20 MED ORDER — AMLODIPINE BESYLATE 5 MG PO TABS
5.0000 mg | ORAL_TABLET | Freq: Every day | ORAL | Status: DC
  Administered 2020-05-20 – 2020-06-06 (×18): 5 mg via ORAL
  Filled 2020-05-20 (×18): qty 1

## 2020-05-20 MED ORDER — SODIUM CHLORIDE 0.9% FLUSH
10.0000 mL | Freq: Two times a day (BID) | INTRAVENOUS | Status: DC
  Administered 2020-05-20: 20 mL
  Administered 2020-05-22 – 2020-06-01 (×13): 10 mL

## 2020-05-20 MED ORDER — MOMETASONE FURO-FORMOTEROL FUM 100-5 MCG/ACT IN AERO
2.0000 | INHALATION_SPRAY | Freq: Two times a day (BID) | RESPIRATORY_TRACT | Status: DC
  Administered 2020-05-21 – 2020-06-06 (×31): 2 via RESPIRATORY_TRACT
  Filled 2020-05-20: qty 8.8

## 2020-05-20 NOTE — Sedation Documentation (Signed)
Pt arrived to CT for procedure this evening. Pt is alert and oriented x4, npo, consent signed, heparin gtt off since 1010 am

## 2020-05-20 NOTE — Progress Notes (Addendum)
ANTICOAGULATION CONSULT NOTE   Pharmacy Consult for Heparin  Indication: Recent DVT, Recent CVA   No Known Allergies  Vital Signs: Temp: 97.9 F (36.6 C) (12/07 1039) Temp Source: Oral (12/07 1039) BP: 146/84 (12/07 1500) Pulse Rate: 55 (12/07 1500)   Weight: 98 kg Height:  65 inches Heparin Dosing Weight: 79.3 kg  Labs: Recent Labs    05/19/20 2129 05/19/20 2309 05/20/20 1237  HGB 12.3  --   --   HCT 40.2  --   --   PLT 681*  --   --   APTT  --   --  39*  CREATININE 0.86  --   --   TROPONINIHS 12 12  --     Assessment: 79 yr old female with shortness of breath, recent COVID, recent DVT on apixaban PTA; apixaban on hold for IR procedure and pharmacy was consulted to dose IV heparin. Heparin infusion was started >12 hours since last apixaban dose. Given recent apixaban exposure, will monitor anticoagulation using aPTT until aPTT and heparin levels correlate.  Heparin was on hold today for IR procedure, but IR procedure was rescheduled to 12/8. Per Alwyn Ren, IR PA, okay to restart heparin this evening and hold heparin infusion at 0600 tomorrow AM (12/8) for IR procedure.  Goal of Therapy:  Heparin level 0.3-0.5 units/ml, recent CVA APTT 66-84 secs Monitor platelets by anticoagulation protocol: Yes   Plan:  Restart heparin infusion at previous rate (900 units/hr) Check aPTT, heparin in 8 hrs Hold heparin infusion at 0600 tomorrow for IR procedure Monitor daily aPTT, heparin level, CBC  Monitor for signs/symptoms of bleeding F/U transition back to apixaban when able  Vicki Mallet, PharmD, BCPS, [facility] Clinical Pharmacist

## 2020-05-20 NOTE — Sedation Documentation (Signed)
Report given to Mark, RN.

## 2020-05-20 NOTE — Procedures (Signed)
Interventional Radiology Procedure Note  Procedure:  1) Right apical pigtail thoracostomy tube placement 2) Right basal pigtail thoracostomy tube placement  Findings: Please refer to procedural dictation for full description. 14 Fr pigtail thoracostomy tubes placed.  Purulent, red fluid aspirated.  Sample sent for culture.  Complications: None immediate  Estimated Blood Loss: <5 ml  Recommendations: Keep both chest tubes to -20 mmHg wall suction. Expect pneumothorax to resolve when positioned upright. Follow up cultures. IR will continue to follow.   Marliss Coots, MD Pager: [phone]

## 2020-05-20 NOTE — Progress Notes (Signed)
[address]       [place] [address]             [phone]        [facility] Medical Record #[id number] Date of Birth: 10-20-1940  Referring: No ref. provider found Primary Care: System, Provider Not In Primary Cardiologist:No primary care provider on file.  Chief Complaint:    Chief Complaint  Patient presents with  . Shortness of Breath  . Hematemesis    History of Present Illness:     Patient recently d/c from [facility] to follow up with Primary care after being in hospital for covid infection and left hemispheric stroke. She had lower extremity dvt and is on eliquis. Comes back with sob and minor hemoptysis. Chest xray shows right  Hydropneumothorax, confirmed on ct with some adhesions/loculations    Current Activity/ Functional Status: Patient is not independent with mobility/ambulation, transfers, ADL's, IADL's.   Zubrod Score: At the time of surgery this patient's most appropriate activity status/level should be described as: []     0    Normal activity, no symptoms []     1    Restricted in physical strenuous activity but ambulatory, able to do out light work []     2    Ambulatory and capable of self care, unable to do work activities, up and about                 more than 50%  Of the time                            [x]     3    Only limited self care, in bed greater than 50% of waking hours []     4    Completely disabled, no self care, confined to bed or chair []     5    Moribund  Past Medical History:  Diagnosis Date  . High cholesterol   . Hypertension     Past Surgical History:  Procedure Laterality Date  . HAND SURGERY      Social History   Tobacco Use  Smoking Status Former Smoker  Smokeless Tobacco Never Used    Social History   Substance and Sexual Activity  Alcohol Use No     No Known Allergies  Current Facility-Administered Medications  Medication Dose Route Frequency Provider Last Rate Last Admin  .  acetaminophen (TYLENOL) tablet 650 mg  650 mg Oral Q6H PRN , MD       Or  . acetaminophen (TYLENOL) suppository 650 mg  650 mg Rectal Q6H PRN , MD      . amLODipine (NORVASC) tablet 5 mg  5 mg Oral Daily , MD      . atorvastatin (LIPITOR) tablet 40 mg  40 mg Oral Daily , MD      . azithromycin (ZITHROMAX) 500 mg in sodium chloride 0.9 % 250 mL IVPB  500 mg Intravenous QHS , MD   Stopped at 05/20/20 0505  . ceFEPIme (MAXIPIME) 2 g in sodium chloride 0.9 % 100 mL IVPB  2 g Intravenous Q8H Eduard Clos, MD      . heparin ADULT infusion 100 units/mL (25000 units/251mL sodium chloride 0.45%)  900 Units/hr Intravenous Continuous Eduard Clos, RPH 9 mL/hr at 05/20/20 0400 900 Units/hr at 05/20/20 0400  . metoprolol tartrate (LOPRESSOR)  tablet 50 mg  50 mg Oral BID Eduard ClosKakrakandy, Arshad N, MD      . sodium chloride flush (NS) 0.9 % injection 10-40 mL  10-40 mL Intracatheter Q12H Eduard ClosKakrakandy, Arshad N, MD      . sodium chloride flush (NS) 0.9 % injection 10-40 mL  10-40 mL Intracatheter PRN Eduard ClosKakrakandy, Arshad N, MD      . vancomycin (VANCOREADY) IVPB 500 mg/100 mL  500 mg Intravenous Q12H Stevphen RochesterLedford, James L, [facility] CampusRPH       Current Outpatient Medications  Medication Sig Dispense Refill  . amLODipine (NORVASC) 5 MG tablet Take 1 tablet (5 mg total) by mouth daily.    Marland Kitchen. apixaban (ELIQUIS) 5 MG TABS tablet Take 2 tablets (10 mg total) by mouth 2 (two) times daily for 4 doses.    Marland Kitchen. apixaban (ELIQUIS) 5 MG TABS tablet Take 1 tablet (5 mg total) by mouth 2 (two) times daily. Start 11/17 evening. 60 tablet   . atorvastatin (LIPITOR) 40 MG tablet Take 1 tablet (40 mg total) by mouth daily.    . Calcium Carb-Cholecalciferol (CALCIUM 600-D PO) Take 1 tablet by mouth daily.    . cholecalciferol (VITAMIN D) 25 MCG (1000 UNIT) tablet Take 1,000 Units by mouth daily.     . metoprolol tartrate (LOPRESSOR) 25 MG tablet  Take 2 tablets (50 mg total) by mouth 2 (two) times daily.    . Omega-3 Fatty Acids (FISH OIL TRIPLE STRENGTH) 1400 MG CAPS Take 1,400 mg by mouth daily.    . pantoprazole (PROTONIX) 40 MG tablet Take 1 tablet (40 mg total) by mouth daily.    . predniSONE (DELTASONE) 10 MG tablet Take 40 mg daily for 1 day, 30 mg daily for 1 day, 20 mg daily for 1 days,10 mg daily for 1 day, then stop 10 tablet 0      Family History  Problem Relation Age of Onset  . Stroke Mother      Review of Systems:  Pertinent items are noted in HPI.     Cardiac Review of Systems: Y or  [    ]= no  Chest Pain [ y   ]  Resting SOB [  y ] Exertional SOB  [ y ]  Orthopnea [ y ]   Pedal Edema [n   ]    Palpitations [ n ] Syncope  [ n ]   Presyncope [n   ]  General Review of Systems: [Y] = yes [  ]=no Constitional: recent weight change [  ]; anorexia [  ]; fatigue [  ]; nausea [  ]; night sweats [  ]; fever [  ]; or chills [  ]                                                               Dental: Last Dentist visit:   Eye : blurred vision [  ]; diplopia [   ]; vision changes [  ];  Amaurosis fugax[  ]; Resp: cough [  ];  wheezing[  ];  hemoptysis[y  ]; shortness of breath[ y ]; paroxysmal nocturnal dyspnea[ y ]; dyspnea on exertion[ y ]; or orthopnea[  ];  GI:  gallstones[  ], vomiting[  ];  dysphagia[  ]; melena[  ];  hematochezia [  ];  heartburn[  ];   Hx of  Colonoscopy[  ]; GU: kidney stones [  ]; hematuria[  ];   dysuria [  ];  nocturia[  ];  history of     obstruction [  ]; urinary frequency [  ]             Skin: rash, swelling[  ];, hair loss[  ];  peripheral edema[  ];  or itching[  ]; Musculosketetal: myalgias[  ];  joint swelling[  ];  joint erythema[  ];  joint pain[  ];  back pain[  ];  Heme/Lymph: bruising[  ];  bleeding[  ];  anemia[  ];  Neuro: TIA[  ];  headaches[  ];  stroke[  ];  vertigo[  ];  seizures[  ];   paresthesias[  ];  difficulty walking[  ];  Psych:depression[  ]; anxiety[  ];  Endocrine:  diabetes[  ];  thyroid dysfunction[  ];              Physical Exam: BP (!) 158/84   Pulse 73   Temp 98.7 F (37.1 C) (Oral)   Resp (!) 25   SpO2 99%    General appearance: alert, cooperative, appears older than stated age, fatigued and no distress Head: Normocephalic, without obvious abnormality, atraumatic Neck: no adenopathy, no carotid bruit, no JVD, supple, symmetrical, trachea midline and thyroid not enlarged, symmetric, no tenderness/mass/nodules Lymph nodes: Cervical, supraclavicular, and axillary nodes normal. Resp: diminished breath sounds RLL, RML and RUL Back: symmetric, no curvature. ROM normal. No CVA tenderness. Cardio: regular rate and rhythm, S1, S2 normal, no murmur, click, rub or gallop GI: soft, non-tender; bowel sounds normal; no masses,  no organomegaly Extremities: extremities normal, atraumatic, no cyanosis or edema Neurologic: Grossly normal but with mild right side weakness  Diagnostic Studies & Laboratory data:     Recent Radiology Findings:   CT Angio Chest PE W and/or Wo Contrast  Result Date: 05/20/2020 CLINICAL DATA:  PE suspected. Recent COVID diagnosis with new hypoxemia and hemoptysis. EXAM: CT ANGIOGRAPHY CHEST WITH CONTRAST TECHNIQUE: Multidetector CT imaging of the chest was performed using the standard protocol during bolus administration of intravenous contrast. Multiplanar CT image reconstructions and MIPs were obtained to evaluate the vascular anatomy. CONTRAST:  75 mL of Omnipaque 350 COMPARISON:  None. FINDINGS: Cardiovascular: Contrast injection is sufficient to demonstrate satisfactory opacification of the pulmonary arteries to the segmental level. There is no pulmonary embolus or evidence of right heart strain. The size of the main pulmonary artery is normal. Mild cardiomegaly. The course and caliber of the aorta are normal. There is mild atherosclerotic calcification. Opacification decreased due to pulmonary arterial phase contrast bolus  timing. Mediastinum/Nodes: -- No mediastinal lymphadenopathy. -- No hilar lymphadenopathy. -- No axillary lymphadenopathy. -- No supraclavicular lymphadenopathy. -- Normal thyroid gland where visualized. -  Unremarkable esophagus. Lungs/Pleura: There is a large, loculated, right-sided hydropneumothorax. There is complete collapse of the right lower lobe. There are hypoattenuating areas throughout the collapsed right lower lobe consistent with a necrotizing pneumonia. There are findings suspicious for a bronchopleural fistula arising from the right lower lobe (axial series 8 image 96). There is complete collapse of the right middle lobe. There is near complete collapse of the right upper lobe. Extensive consolidation and atelectasis is noted throughout the right middle and right upper lobes. There is shift of the mediastinum to the left. Hazy, coarse airspace opacities are noted involving the left lung field without evidence for left-sided pneumothorax or  significant pleural effusion. Upper Abdomen: Contrast bolus timing is not optimized for evaluation of the abdominal organs. The visualized portions of the organs of the upper abdomen are normal. Musculoskeletal: No chest wall abnormality. No bony spinal canal stenosis. Review of the MIP images confirms the above findings. IMPRESSION: 1. No acute pulmonary embolism. 2. Large right-sided, loculated, hydropneumothorax. 3. 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 4. Complete collapse of the right middle lobe and partial collapse of the right upper lobe. There are nonenhancing components within the right middle lobe and right upper lobe concerning for multifocal pneumonia. 5. Scattered coarse airspace opacities are noted throughout the left lung field consistent with the patient's history of recent COVID-19 pneumonia. Aortic Atherosclerosis (ICD10-I70.0).  Electronically Signed   By: Katherine Mantle M.D.   On: 05/20/2020 00:22   DG Chest Portable 1 View  Result Date: 05/19/2020 CLINICAL DATA:  Sudden onset short of breath with hemoptysis EXAM: PORTABLE CHEST 1 VIEW COMPARISON:  04/21/2020 FINDINGS: Interval hyper lucent right thorax with meniscus levels at the right base concerning for moderate to large hydropneumothorax. Patchy consolidation and ground-glass density in the left lower lung without significant change. Stable cardiomediastinal silhouette with aortic atherosclerosis. IMPRESSION: 1. Increased hyperlucency of the right thorax with meniscus levels at the right base concerning for moderate to large hydropneumothorax. CT chest recommended for further evaluation. 2. Stable patchy consolidation and ground-glass density in the left lower lung. Critical Value/emergent results were called by telephone at the time of interpretation on 05/19/2020 at 7:39 pm to provider [facility] , who verbally acknowledged these results. Electronically Signed   By: Jasmine Pang M.D.   On: 05/19/2020 19:39     I have independently reviewed the above radiologic studies and discussed with the patient   Recent Lab Findings: Lab Results  Component Value Date   WBC 18.5 (H) 05/19/2020   HGB 12.3 05/19/2020   HCT 40.2 05/19/2020   PLT 681 (H) 05/19/2020   GLUCOSE 99 05/19/2020   CHOL 158 04/22/2020   TRIG 135 04/22/2020   HDL 38 (L) 04/22/2020   LDLCALC 93 04/22/2020   ALT 29 05/19/2020   AST 26 05/19/2020   NA 143 05/19/2020   K 3.7 05/19/2020   CL 104 05/19/2020   CREATININE 0.86 05/19/2020   BUN 13 05/19/2020   CO2 27 05/19/2020   INR 1.5 (H) 04/21/2020   HGBA1C 5.5 04/22/2020      Assessment / Plan:   1# Large right-sided, loculated, hydropneumothorax.- patient has been on Eliquis now held and on heparin - with loculations recommend ct/us guided ct tube placement  Right- culture any fluid - orders placed   [name] MD       [address] [place] [place] [address] Office [phone]     05/20/2020 6:54 AM     Patient ID: Beverly Howell, female   DOB: Dec 20, 1940, 79 y.o.   MRN: [id number]

## 2020-05-20 NOTE — Consult Note (Signed)
Chief Complaint: Patient was seen in consultation today for  Chief Complaint  Patient presents with  . Shortness of Breath  . Hematemesis    Referring Physician(s): Dr. Tyrone Sage   Supervising Physician: Dr. Elby Showers  Patient Status: [facility] - ED  History of Present Illness: Beverly Howell is a 79 y.o. 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 She was discharged to [facility] (SNF).   Beverly Howell was brought to the [facility] ED 05/19/20 with severe shortness of breath with associated chest pain, cough and hemoptysis. Imaging was obtained.  CT Angio Chest 05/20/20 IMPRESSION: 1. No acute pulmonary embolism. 2. Large right-sided, loculated, hydropneumothorax. 3. 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 4. Complete collapse of the right middle lobe and partial collapse of the right upper lobe. There are nonenhancing components within the right middle lobe and right upper lobe concerning for multifocal pneumonia. 5. Scattered coarse airspace opacities are noted throughout the left lung field consistent with the patient's history of recent COVID-19 Pneumonia.  Dr. Tyrone Sage with Cardiothoracic surgery has seen the patient and a request has been made for Interventional Radiology to evaluate this patient for an image-guided right chest tube placement. This case has been reviewed and procedure approved by Dr. Elby Showers.   Past Medical History:  Diagnosis Date  . High cholesterol   . Hypertension     Past Surgical History:  Procedure Laterality Date  . HAND SURGERY      Allergies: Patient has no known allergies.  Medications: Prior to Admission medications   Medication Sig Start Date End Date Taking? Authorizing Provider  amLODipine (NORVASC) 5 MG  tablet Take 1 tablet (5 mg total) by mouth daily. 04/29/20   Ghimire, Werner Lean, MD  apixaban (ELIQUIS) 5 MG TABS tablet Take 2 tablets (10 mg total) by mouth 2 (two) times daily for 4 doses. 04/28/20 04/30/20  Ghimire, Werner Lean, MD  apixaban (ELIQUIS) 5 MG TABS tablet Take 1 tablet (5 mg total) by mouth 2 (two) times daily. Start 11/17 evening. 04/30/20   Ghimire, Werner Lean, MD  atorvastatin (LIPITOR) 40 MG tablet Take 1 tablet (40 mg total) by mouth daily. 04/29/20   Ghimire, Werner Lean, MD  Calcium Carb-Cholecalciferol (CALCIUM 600-D PO) Take 1 tablet by mouth daily.    [provider]  cholecalciferol (VITAMIN D) 25 MCG (1000 UNIT) tablet Take 1,000 Units by mouth daily.     [provider]  metoprolol tartrate (LOPRESSOR) 25 MG tablet Take 2 tablets (50 mg total) by mouth 2 (two) times daily. 04/28/20   Ghimire, Werner Lean, MD  Omega-3 Fatty Acids (FISH OIL TRIPLE STRENGTH) 1400 MG CAPS Take 1,400 mg by mouth daily.    [provider]  pantoprazole (PROTONIX) 40 MG tablet Take 1 tablet (40 mg total) by mouth daily. 04/29/20   Ghimire, Werner Lean, MD  predniSONE (DELTASONE) 10 MG tablet Take 40 mg daily for 1 day, 30 mg daily for 1 day, 20 mg daily for 1 days,10 mg daily for 1 day, then stop 04/28/20   Maretta Bees, MD     Family History  Problem Relation Age of Onset  . Stroke Mother     Social History   Socioeconomic History  . Marital status: Single    Spouse name: Not on file  . Number of children: Not  on file  . Years of education: Not on file  . Highest education level: Not on file  Occupational History  . Not on file  Tobacco Use  . Smoking status: Former Games developer  . Smokeless tobacco: Never Used  Substance and Sexual Activity  . Alcohol use: No  . Drug use: No  . Sexual activity: Not on file  Other Topics Concern  . Not on file  Social History Narrative  . Not on file   Social Determinants of Health   Financial Resource Strain:   .  Difficulty of Paying Living Expenses: Not on file  Food Insecurity:   . Worried About Programme researcher, broadcasting/film/video in the Last Year: Not on file  . Ran Out of Food in the Last Year: Not on file  Transportation Needs:   . Lack of Transportation (Medical): Not on file  . Lack of Transportation (Non-Medical): Not on file  Physical Activity:   . Days of Exercise per Week: Not on file  . Minutes of Exercise per Session: Not on file  Stress:   . Feeling of Stress : Not on file  Social Connections:   . Frequency of Communication with Friends and Family: Not on file  . Frequency of Social Gatherings with Friends and Family: Not on file  . Attends Religious Services: Not on file  . Active Member of Clubs or Organizations: Not on file  . Attends Banker Meetings: Not on file  . Marital Status: Not on file    Review of Systems: A 12 point ROS discussed and pertinent positives are indicated in the HPI above.  All other systems are negative.  Review of Systems  Constitutional: Positive for fatigue. Negative for appetite change.  Respiratory: Positive for cough and shortness of breath.   Cardiovascular: Positive for leg swelling. Negative for chest pain.  Gastrointestinal: Negative for abdominal pain, diarrhea, nausea and vomiting.  Musculoskeletal: Negative for back pain.  Neurological: Negative for headaches.    Vital Signs: BP (!) 151/79   Pulse (!) 52   Temp 98.7 F (37.1 C) (Oral)   Resp 18   SpO2 99%   Physical Exam Constitutional:      General: She is not in acute distress.    Appearance: She is not ill-appearing.  HENT:     Mouth/Throat:     Mouth: Mucous membranes are moist.     Pharynx: Oropharynx is clear.     Comments: Poor dental hygiene; missing teeth Cardiovascular:     Rate and Rhythm: Bradycardia present. Rhythm irregular.     Pulses: Normal pulses.     Heart sounds: Normal heart sounds.  Pulmonary:     Effort: Pulmonary effort is normal.     Comments:  Left lung sounds clear. Right lung sounds absent/markedly diminished.  Abdominal:     General: Bowel sounds are normal.     Palpations: Abdomen is soft.  Musculoskeletal:     Right lower leg: Edema present.     Left lower leg: Edema present.     Comments: +3 bilateral lower extremity edema  Skin:    General: Skin is warm and dry.  Neurological:     Mental Status: She is alert and oriented to person, place, and time.     Imaging: DG Abd 1 View  Result Date: 04/22/2020 CLINICAL DATA:  MRI clearance.  Altered mental status. EXAM: ABDOMEN - 1 VIEW COMPARISON:  None. FINDINGS: The bowel gas pattern is normal. No radio-opaque calculi or  other significant radiographic abnormality are seen. There appear to be hazy airspace opacities involving the lungs bilaterally. No metallic foreign body identified. There are degenerative changes of both hips, right worse than left. There are degenerative changes of the lumbar spine. IMPRESSION: 1. No metallic foreign body identified. 2. Hazy airspace opacities involving the lungs bilaterally. 3. Nonobstructive bowel gas pattern. Electronically Signed   By: Katherine Mantle M.D.   On: 04/22/2020 00:23   CT Head Wo Contrast  Result Date: 04/21/2020 CLINICAL DATA:  79 year old female with intermittent dysarthria and left side weakness x1 week, progressive since 1400 hours on 04/20/2020. Hypoxia on initial presentation. EXAM: CT HEAD WITHOUT CONTRAST TECHNIQUE: Contiguous axial images were obtained from the base of the skull through the vertex without intravenous contrast. COMPARISON:  None. FINDINGS: Brain: Small chronic infarct in the left cerebellum (series 3, image 8). No acute intracranial hemorrhage identified. No midline shift, mass effect, or evidence of intracranial mass lesion. No ventriculomegaly. Mild for age scattered bilateral white matter hypodensity. Small age indeterminate hypodense area in the posterior right insula on series 3, image 15. No other no  acute or subacute cortically based infarct identified. No supratentorial cortical encephalomalacia identified. Deep gray matter nuclei and brainstem appear within normal limits. Vascular: Calcified atherosclerosis at the skull base. No suspicious intracranial vascular hyperdensity. Skull: No acute osseous abnormality identified. Sinuses/Orbits: Small fluid level in the left maxillary sinus. Bubbly opacity there as well as in the right maxillary and sphenoid sinuses. Bubbly opacity in the posterior left ethmoid. Tympanic cavities and mastoids appear clear. Other: No acute orbit or scalp soft tissue finding identified. IMPRESSION: 1. Small infarct at the posterior right insula, age indeterminate but suspicious for subacute ischemia in this setting. No associated hemorrhage or mass effect. 2. Elsewhere mild for age cerebral white matter changes, and a small chronic left cerebellar infarct. 3. Acute paranasal sinus inflammation. Electronically Signed   By: Odessa Fleming M.D.   On: 04/21/2020 22:56   CT Angio Chest PE W and/or Wo Contrast  Result Date: 05/20/2020 CLINICAL DATA:  PE suspected. Recent COVID diagnosis with new hypoxemia and hemoptysis. EXAM: CT ANGIOGRAPHY CHEST WITH CONTRAST TECHNIQUE: Multidetector CT imaging of the chest was performed using the standard protocol during bolus administration of intravenous contrast. Multiplanar CT image reconstructions and MIPs were obtained to evaluate the vascular anatomy. CONTRAST:  75 mL of Omnipaque 350 COMPARISON:  None. FINDINGS: Cardiovascular: Contrast injection is sufficient to demonstrate satisfactory opacification of the pulmonary arteries to the segmental level. There is no pulmonary embolus or evidence of right heart strain. The size of the main pulmonary artery is normal. Mild cardiomegaly. The course and caliber of the aorta are normal. There is mild atherosclerotic calcification. Opacification decreased due to pulmonary arterial phase contrast bolus timing.  Mediastinum/Nodes: -- No mediastinal lymphadenopathy. -- No hilar lymphadenopathy. -- No axillary lymphadenopathy. -- No supraclavicular lymphadenopathy. -- Normal thyroid gland where visualized. -  Unremarkable esophagus. Lungs/Pleura: There is a large, loculated, right-sided hydropneumothorax. There is complete collapse of the right lower lobe. There are hypoattenuating areas throughout the collapsed right lower lobe consistent with a necrotizing pneumonia. There are findings suspicious for a bronchopleural fistula arising from the right lower lobe (axial series 8 image 96). There is complete collapse of the right middle lobe. There is near complete collapse of the right upper lobe. Extensive consolidation and atelectasis is noted throughout the right middle and right upper lobes. There is shift of the mediastinum to the left. Hazy, coarse  airspace opacities are noted involving the left lung field without evidence for left-sided pneumothorax or significant pleural effusion. Upper Abdomen: Contrast bolus timing is not optimized for evaluation of the abdominal organs. The visualized portions of the organs of the upper abdomen are normal. Musculoskeletal: No chest wall abnormality. No bony spinal canal stenosis. Review of the MIP images confirms the above findings. IMPRESSION: 1. No acute pulmonary embolism. 2. Large right-sided, loculated, hydropneumothorax. 3. 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 4. Complete collapse of the right middle lobe and partial collapse of the right upper lobe. There are nonenhancing components within the right middle lobe and right upper lobe concerning for multifocal pneumonia. 5. Scattered coarse airspace opacities are noted throughout the left lung field consistent with the patient's history of recent COVID-19 pneumonia. Aortic Atherosclerosis (ICD10-I70.0). Electronically  Signed   By: Katherine Mantle M.D.   On: 05/20/2020 00:22   MR ANGIO HEAD WO CONTRAST  Result Date: 04/22/2020 CLINICAL DATA:  Follow-up examination for acute stroke. EXAM: MRI HEAD WITHOUT CONTRAST MRA HEAD WITHOUT CONTRAST TECHNIQUE: Multiplanar, multiecho pulse sequences of the brain and surrounding structures were obtained without intravenous contrast. Angiographic images of the head were obtained using MRA technique without contrast. COMPARISON:  Prior CT from 04/21/2020. FINDINGS: MRI HEAD FINDINGS Brain: Examination degraded by motion artifact. Generalized age-related cerebral atrophy. Mild chronic microvascular ischemic disease present within the periventricular and deep white matter both cerebral hemispheres. Small remote left cerebellar infarct noted. Patchy restricted diffusion seen involving the posterior right insula as well as the overlying right posterior frontal and parietal lobes, consistent with right MCA territory infarcts. No significant mass effect or associated hemorrhage. An embolic etiology is suspected given distribution. No other diffusion abnormality to suggest acute or subacute ischemia. Gray-white matter differentiation otherwise maintained. No other areas of encephalomalacia to suggest chronic cortical infarction on this motion degraded exam. No other definite foci of susceptibility artifact to suggest acute or chronic intracranial hemorrhage. No mass lesion, midline shift or mass effect. No hydrocephalus or extra-axial fluid collection. No made of a partially empty sella. Midline structures intact. Vascular: Major intracranial vascular flow voids are maintained. Skull and upper cervical spine: Craniocervical junction within normal limits. Multilevel cervical spondylosis noted within the upper cervical spine without high-grade stenosis. Bone marrow signal intensity normal. No scalp soft tissue abnormality. Sinuses/Orbits: Globes and orbital soft tissues within normal limits.  Scattered mucosal thickening noted throughout the ethmoidal air cells and maxillary sinuses. Superimposed air-fluid level noted within the left maxillary sinus. Trace bilateral mastoid effusions. Visualized nasopharynx within normal limits. Inner ear structures grossly normal. Other: None. MRA HEAD FINDINGS ANTERIOR CIRCULATION: Examination somewhat degraded by motion artifact. Visualized distal cervical segments of the internal carotid arteries are widely patent with antegrade flow. Petrous, cavernous, and supraclinoid ICAs demonstrate probable atheromatous irregularity without appreciable hemodynamically significant stenosis. A1 segments patent bilaterally. Normal anterior communicating artery complex. Anterior cerebral arteries patent to their distal aspects without stenosis. No M1 stenosis or occlusion. Normal MCA bifurcations. Distal MCA branches well perfused and fairly symmetric. POSTERIOR CIRCULATION: Vertebral arteries patent to the vertebrobasilar junction without stenosis. Both PICAs patent proximally. Basilar patent to its distal aspect without stenosis. Superior cerebral arteries patent bilaterally. Right PCA supplied via a hypoplastic right P1 segment and prominent right posterior communicating artery. Fetal type origin left PCA. Both PCAs perfused to their distal aspects without proximal flow-limiting stenosis. No aneurysm. IMPRESSION: MRI HEAD  IMPRESSION: 1. 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 An embolic etiology is suspected. No significant mass effect. 2. Underlying age-related cerebral atrophy with mild chronic small vessel ischemic disease. Small remote left cerebellar infarct. MRA HEAD IMPRESSION: 1. Technically limited exam due to motion artifact. 2. Negative intracranial MRA for large vessel occlusion. No hemodynamically significant or correctable stenosis. 3. Mild  atheromatous irregularity about the carotid siphons without significant stenosis. Electronically Signed   By: Rise Mu M.D.   On: 04/22/2020 01:29   MR BRAIN WO CONTRAST  Result Date: 04/22/2020 CLINICAL DATA:  Follow-up examination for acute stroke. EXAM: MRI HEAD WITHOUT CONTRAST MRA HEAD WITHOUT CONTRAST TECHNIQUE: Multiplanar, multiecho pulse sequences of the brain and surrounding structures were obtained without intravenous contrast. Angiographic images of the head were obtained using MRA technique without contrast. COMPARISON:  Prior CT from 04/21/2020. FINDINGS: MRI HEAD FINDINGS Brain: Examination degraded by motion artifact. Generalized age-related cerebral atrophy. Mild chronic microvascular ischemic disease present within the periventricular and deep white matter both cerebral hemispheres. Small remote left cerebellar infarct noted. Patchy restricted diffusion seen involving the posterior right insula as well as the overlying right posterior frontal and parietal lobes, consistent with right MCA territory infarcts. No significant mass effect or associated hemorrhage. An embolic etiology is suspected given distribution. No other diffusion abnormality to suggest acute or subacute ischemia. Gray-white matter differentiation otherwise maintained. No other areas of encephalomalacia to suggest chronic cortical infarction on this motion degraded exam. No other definite foci of susceptibility artifact to suggest acute or chronic intracranial hemorrhage. No mass lesion, midline shift or mass effect. No hydrocephalus or extra-axial fluid collection. No made of a partially empty sella. Midline structures intact. Vascular: Major intracranial vascular flow voids are maintained. Skull and upper cervical spine: Craniocervical junction within normal limits. Multilevel cervical spondylosis noted within the upper cervical spine without high-grade stenosis. Bone marrow signal intensity normal. No scalp soft  tissue abnormality. Sinuses/Orbits: Globes and orbital soft tissues within normal limits. Scattered mucosal thickening noted throughout the ethmoidal air cells and maxillary sinuses. Superimposed air-fluid level noted within the left maxillary sinus. Trace bilateral mastoid effusions. Visualized nasopharynx within normal limits. Inner ear structures grossly normal. Other: None. MRA HEAD FINDINGS ANTERIOR CIRCULATION: Examination somewhat degraded by motion artifact. Visualized distal cervical segments of the internal carotid arteries are widely patent with antegrade flow. Petrous, cavernous, and supraclinoid ICAs demonstrate probable atheromatous irregularity without appreciable hemodynamically significant stenosis. A1 segments patent bilaterally. Normal anterior communicating artery complex. Anterior cerebral arteries patent to their distal aspects without stenosis. No M1 stenosis or occlusion. Normal MCA bifurcations. Distal MCA branches well perfused and fairly symmetric. POSTERIOR CIRCULATION: Vertebral arteries patent to the vertebrobasilar junction without stenosis. Both PICAs patent proximally. Basilar patent to its distal aspect without stenosis. Superior cerebral arteries patent bilaterally. Right PCA supplied via a hypoplastic right P1 segment and prominent right posterior communicating artery. Fetal type origin left PCA. Both PCAs perfused to their distal aspects without proximal flow-limiting stenosis. No aneurysm. IMPRESSION: MRI HEAD IMPRESSION: 1. 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 An embolic etiology is suspected. No significant mass effect. 2. Underlying age-related cerebral atrophy with mild chronic small vessel ischemic disease. Small remote left cerebellar infarct. MRA HEAD IMPRESSION: 1. Technically limited exam due to motion artifact. 2. Negative intracranial MRA for large  vessel occlusion. No hemodynamically  significant or correctable stenosis. 3. Mild atheromatous irregularity about the carotid siphons without significant stenosis. Electronically Signed   By: Rise MuBenjamin  McClintock M.D.   On: 04/22/2020 01:29   DG Chest Portable 1 View  Result Date: 05/19/2020 CLINICAL DATA:  Sudden onset short of breath with hemoptysis EXAM: PORTABLE CHEST 1 VIEW COMPARISON:  04/21/2020 FINDINGS: Interval hyper lucent right thorax with meniscus levels at the right base concerning for moderate to large hydropneumothorax. Patchy consolidation and ground-glass density in the left lower lung without significant change. Stable cardiomediastinal silhouette with aortic atherosclerosis. IMPRESSION: 1. Increased hyperlucency of the right thorax with meniscus levels at the right base concerning for moderate to large hydropneumothorax. CT chest recommended for further evaluation. 2. Stable patchy consolidation and ground-glass density in the left lower lung. Critical Value/emergent results were called by telephone at the time of interpretation on 05/19/2020 at 7:39 pm to provider [facility]ER , who verbally acknowledged these results. Electronically Signed   By: Jasmine PangKim  Fujinaga M.D.   On: 05/19/2020 19:39   DG Chest Portable 1 View  Result Date: 04/21/2020 CLINICAL DATA:  Hypoxia EXAM: PORTABLE CHEST 1 VIEW COMPARISON:  None. FINDINGS: The heart size is enlarged. There are hazy bilateral airspace opacities. There is no pneumothorax. No large pleural effusion. There are end-stage degenerative changes of both glenohumeral joints. IMPRESSION: Cardiomegaly with hazy bilateral airspace opacities concerning for pulmonary edema or an atypical infectious process. Electronically Signed   By: Katherine Mantlehristopher  Green M.D.   On: 04/21/2020 20:21   ECHOCARDIOGRAM COMPLETE  Result Date: 04/23/2020    ECHOCARDIOGRAM REPORT   Patient Name:   Beverly FractionYVONNE Howell Date of Exam: 04/23/2020 Medical Rec #:  [id number]      Height:       65.0 in Accession #:    [id number]    Weight:       183.4 lb Date of Birth:  03/16/1941      BSA:          1.907 m Patient Age:    79 years      BP:           151/94 mmHg Patient Gender: F             HR:           79 bpm. Exam Location:  Inpatient Procedure: 2D Echo, Cardiac Doppler and Color Doppler Indications:    Stroke 434.91 / I163.9  History:        Patient has no prior history of Echocardiogram examinations.                 Risk Factors:Hypertension and Dyslipidemia. COVID-19 Positive.  Sonographer:    Elmarie Shileyiffany Dance Referring Phys: [id number] ARSHAD N KAKRAKANDY IMPRESSIONS  1. Left ventricular ejection Howell, by estimation, is 60 to 65%. The left ventricle has normal function. The left ventricle has no regional wall motion abnormalities. Left ventricular diastolic parameters are consistent with Grade I diastolic dysfunction (impaired relaxation).  2. Right ventricular systolic function is normal. The right ventricular size is normal. There is normal pulmonary artery systolic pressure. The estimated right ventricular systolic pressure is 27.4 mmHg.  3. The mitral valve is normal in structure. Trivial mitral valve regurgitation. No evidence of mitral stenosis.  4. The aortic valve is normal in structure. There is moderate calcification of the aortic valve. There is moderate thickening of the aortic valve. Aortic valve regurgitation is trivial. Mild to moderate aortic valve sclerosis/calcification is present, without any evidence of aortic stenosis.  Aortic valve mean gradient measures 8.5 mmHg. Aortic valve Vmax measures 2.13 m/s.  5. The inferior vena cava is normal in size with greater than 50% respiratory variability, suggesting right atrial pressure of 3 mmHg. Conclusion(s)/Recommendation(s): No intracardiac source of embolism detected on this transthoracic study. A transesophageal echocardiogram is recommended to exclude cardiac source of embolism if clinically indicated. FINDINGS  Left  Ventricle: Left ventricular ejection Howell, by estimation, is 60 to 65%. The left ventricle has normal function. The left ventricle has no regional wall motion abnormalities. The left ventricular internal cavity size was normal in size. There is  no left ventricular hypertrophy. Left ventricular diastolic parameters are consistent with Grade I diastolic dysfunction (impaired relaxation). Right Ventricle: The right ventricular size is normal. No increase in right ventricular wall thickness. Right ventricular systolic function is normal. There is normal pulmonary artery systolic pressure. The tricuspid regurgitant velocity is 2.47 m/s, and  with an assumed right atrial pressure of 3 mmHg, the estimated right ventricular systolic pressure is 27.4 mmHg. Left Atrium: Left atrial size was normal in size. Right Atrium: Right atrial size was normal in size. Pericardium: There is no evidence of pericardial effusion. Mitral Valve: The mitral valve is normal in structure. Trivial mitral valve regurgitation. No evidence of mitral valve stenosis. Tricuspid Valve: The tricuspid valve is normal in structure. Tricuspid valve regurgitation is mild . No evidence of tricuspid stenosis. Aortic Valve: The aortic valve is normal in structure. There is moderate calcification of the aortic valve. There is moderate thickening of the aortic valve. Aortic valve regurgitation is trivial. Mild to moderate aortic valve sclerosis/calcification is present, without any evidence of aortic stenosis. Aortic valve mean gradient measures 8.5 mmHg. Aortic valve peak gradient measures 18.2 mmHg. Aortic valve area, by VTI measures 1.58 cm. Pulmonic Valve: The pulmonic valve was normal in structure. Pulmonic valve regurgitation is not visualized. No evidence of pulmonic stenosis. Aorta: The aortic root is normal in size and structure. Venous: The inferior vena cava is normal in size with greater than 50% respiratory variability, suggesting right atrial  pressure of 3 mmHg. IAS/Shunts: No atrial level shunt detected by color flow Doppler.  LEFT VENTRICLE PLAX 2D LVIDd:         3.90 cm  Diastology LVIDs:         2.72 cm  LV e' medial:    4.35 cm/s LV PW:         0.92 cm  LV E/e' medial:  14.5 LV IVS:        1.15 cm  LV e' lateral:   6.42 cm/s LVOT diam:     2.00 cm  LV E/e' lateral: 9.8 LV SV:         63 LV SV Index:   33 LVOT Area:     3.14 cm  RIGHT VENTRICLE             IVC RV Basal diam:  2.65 cm     IVC diam: 1.81 cm RV S prime:     12.00 cm/s TAPSE (M-mode): 2.0 cm LEFT ATRIUM             Index       RIGHT ATRIUM          Index LA diam:        3.20 cm 1.68 cm/m  RA Area:     9.10 cm LA Vol (A2C):   56.4 ml 29.58 ml/m RA Volume:   14.70 ml 7.71 ml/m LA Vol (  A4C):   59.4 ml 31.15 ml/m LA Biplane Vol: 60.1 ml 31.52 ml/m  AORTIC VALVE AV Area (Vmax):    1.49 cm AV Area (Vmean):   1.39 cm AV Area (VTI):     1.58 cm AV Vmax:           213.50 cm/s AV Vmean:          135.000 cm/s AV VTI:            0.395 m AV Peak Grad:      18.2 mmHg AV Mean Grad:      8.5 mmHg LVOT Vmax:         101.40 cm/s LVOT Vmean:        59.550 cm/s LVOT VTI:          0.199 m LVOT/AV VTI ratio: 0.50  AORTA Ao Root diam: 2.80 cm Ao Asc diam:  3.40 cm MITRAL VALVE                TRICUSPID VALVE MV Area (PHT): 2.45 cm     TR Peak grad:   24.4 mmHg MV Decel Time: 310 msec     TR Vmax:        247.00 cm/s MV E velocity: 62.90 cm/s MV A velocity: 103.00 cm/s  SHUNTS MV E/A ratio:  0.61         Systemic VTI:  0.20 m                             Systemic Diam: 2.00 cm Donato Schultz MD Electronically signed by Donato Schultz MD Signature Date/Time: 04/23/2020/2:48:24 PM    Final    VAS US CAROTID (at [facility] and [facility] only)  Result Date: 04/22/2020 Carotid Arterial Duplex Study Indications:       CVA and DVT positive. Risk Factors:      Hypertension. Other Factors:     COVID 19 positive. Comparison Study:  No prior studies. Performing Technologist: Chanda Busing RVT  Examination Guidelines: A complete  evaluation includes B-mode imaging, spectral Doppler, color Doppler, and power Doppler as needed of all accessible portions of each vessel. Bilateral testing is considered an integral part of a complete examination. Limited examinations for reoccurring indications may be performed as noted.  Right Carotid Findings: +----------+--------+--------+--------+-----------------------+--------+           PSV cm/sEDV cm/sStenosisPlaque Description     Comments +----------+--------+--------+--------+-----------------------+--------+ CCA Prox  56      5               smooth and heterogenous         +----------+--------+--------+--------+-----------------------+--------+ CCA Distal49      11              smooth and heterogenous         +----------+--------+--------+--------+-----------------------+--------+ ICA Prox  48      14              smooth and heterogenous         +----------+--------+--------+--------+-----------------------+--------+ ICA Distal87      24                                     tortuous +----------+--------+--------+--------+-----------------------+--------+ ECA       59      6                                               +----------+--------+--------+--------+-----------------------+--------+ +----------+--------+-------+--------+-------------------+  PSV cm/sEDV cmsDescribeArm Pressure (mmHG) +----------+--------+-------+--------+-------------------+ [id number]                                         +----------+--------+-------+--------+-------------------+ +---------+--------+--+--------+--+---------+ VertebralPSV cm/s59EDV cm/s13Antegrade +---------+--------+--+--------+--+---------+  Left Carotid Findings: +----------+--------+--------+--------+-----------------------+--------+           PSV cm/sEDV cm/sStenosisPlaque Description     Comments +----------+--------+--------+--------+-----------------------+--------+ CCA  Prox  97      19              smooth and heterogenous         +----------+--------+--------+--------+-----------------------+--------+ CCA Distal49      11              smooth and heterogenous         +----------+--------+--------+--------+-----------------------+--------+ ICA Prox  37      11              smooth and heterogenous         +----------+--------+--------+--------+-----------------------+--------+ ICA Distal59      22                                     tortuous +----------+--------+--------+--------+-----------------------+--------+ ECA       44      5                                               +----------+--------+--------+--------+-----------------------+--------+ +----------+--------+--------+--------+-------------------+           PSV cm/sEDV cm/sDescribeArm Pressure (mmHG) +----------+--------+--------+--------+-------------------+ [id number]                                          +----------+--------+--------+--------+-------------------+ +---------+--------+--+--------+--+---------+ VertebralPSV cm/s33EDV cm/s10Antegrade +---------+--------+--+--------+--+---------+   Summary: Right Carotid: Velocities in the right ICA are consistent with a 1-39% stenosis. Left Carotid: Velocities in the left ICA are consistent with a 1-39% stenosis. Vertebrals: Bilateral vertebral arteries demonstrate antegrade flow. *See table(s) above for measurements and observations.  Electronically signed by Delia Heady MD on 04/22/2020 at 1:17:39 PM.    Final    VAS [place] LOWER EXTREMITY VENOUS (DVT)  Result Date: 04/22/2020  Lower Venous DVT Study Indications: Elevated Ddimer.  Risk Factors: COVID 19 positive. Comparison Study: No prior studies. Performing Technologist: Chanda Busing RVT  Examination Guidelines: A complete evaluation includes B-mode imaging, spectral Doppler, color Doppler, and power Doppler as needed of all accessible portions of each vessel.  Bilateral testing is considered an integral part of a complete examination. Limited examinations for reoccurring indications may be performed as noted. The reflux portion of the exam is performed with the patient in reverse Trendelenburg.  +---------+---------------+---------+-----------+----------+-----------------+ RIGHT    CompressibilityPhasicitySpontaneityPropertiesThrombus Aging    +---------+---------------+---------+-----------+----------+-----------------+ CFV      Full           Yes      Yes                                    +---------+---------------+---------+-----------+----------+-----------------+ SFJ      Full                                                           +---------+---------------+---------+-----------+----------+-----------------+  FV Prox  Full                                                           +---------+---------------+---------+-----------+----------+-----------------+ FV Mid   Partial        No       No                   Age Indeterminate +---------+---------------+---------+-----------+----------+-----------------+ FV DistalNone           No       No                   Age Indeterminate +---------+---------------+---------+-----------+----------+-----------------+ PFV      Full                                                           +---------+---------------+---------+-----------+----------+-----------------+ POP      None           No       No                   Age Indeterminate +---------+---------------+---------+-----------+----------+-----------------+ PTV      None                                         Age Indeterminate +---------+---------------+---------+-----------+----------+-----------------+ PERO     None                                         Age Indeterminate +---------+---------------+---------+-----------+----------+-----------------+ Gastroc  None                                          Age Indeterminate +---------+---------------+---------+-----------+----------+-----------------+   +---------+---------------+---------+-----------+----------+--------------+ LEFT     CompressibilityPhasicitySpontaneityPropertiesThrombus Aging +---------+---------------+---------+-----------+----------+--------------+ CFV      Full           Yes      Yes                                 +---------+---------------+---------+-----------+----------+--------------+ SFJ      Full                                                        +---------+---------------+---------+-----------+----------+--------------+ FV Prox  Full                                                        +---------+---------------+---------+-----------+----------+--------------+ FV Mid   Full                                                        +---------+---------------+---------+-----------+----------+--------------+  FV DistalFull                                                        +---------+---------------+---------+-----------+----------+--------------+ PFV      Full                                                        +---------+---------------+---------+-----------+----------+--------------+ POP      None           No       No                   Acute          +---------+---------------+---------+-----------+----------+--------------+ PTV      None                                         Acute          +---------+---------------+---------+-----------+----------+--------------+ PERO     None                                         Acute          +---------+---------------+---------+-----------+----------+--------------+    Summary: RIGHT: - Findings consistent with age indeterminate deep vein thrombosis involving the right femoral vein, right popliteal vein, right posterior tibial veins, right peroneal veins, and right gastrocnemius veins. - No cystic  structure found in the popliteal fossa.  LEFT: - Findings consistent with acute deep vein thrombosis involving the left popliteal vein, left posterior tibial veins, and left peroneal veins. - No cystic structure found in the popliteal fossa.  *See table(s) above for measurements and observations. Electronically signed by Gretta Began MD on 04/22/2020 at 2:50:20 PM.    Final     Labs:  CBC: Recent Labs    04/26/20 0153 04/27/20 0713 04/28/20 0604 05/19/20 2129  WBC 15.4* 23.6* 22.6* 18.5*  HGB 12.1 12.5 12.8 12.3  HCT 36.9 38.9 40.3 40.2  PLT 243 302 299 681*    COAGS: Recent Labs    04/21/20 1950  INR 1.5*  APTT 27    BMP: Recent Labs    04/26/20 0153 04/27/20 0400 04/28/20 0231 05/19/20 2129  NA 139 138 136 143  K 3.8 5.1 5.4* 3.7  CL 104 104 104 104  CO2 25 21* 18* 27  GLUCOSE 150* 145* 155* 99  BUN 32* 36* 32* 13  CALCIUM 8.4* 8.4* 8.1* 9.2  CREATININE 1.31* 1.29* 1.13* 0.86  GFRNONAA 42* 42* 50* >60    LIVER FUNCTION TESTS: Recent Labs    04/25/20 0052 04/26/20 0153 04/27/20 0400 05/19/20 2129  BILITOT 1.4* 1.1 1.2 0.7  AST ALT ALKPHOS 75 68 77 82  PROT 6.3* 6.2* 5.4* 6.7  ALBUMIN 2.8* 2.7* 2.5* 2.5*    TUMOR MARKERS: No results for input(s): AFPTM, CEA, CA199, CHROMGRNA in the last 8760 hours.  Assessment and Plan:  Right hydropneumothorax;  history of Covid-19 pneumonia: Beverly Howell, 79 year old female, is scheduled to be seen today at the [facility] Interventional Radiology department for image-guided right chest tube placement(s). This procedure was discussed with the patient at the bedside and with the patient's daughter via telephone.   Risks and benefits discussed with the patient including bleeding, infection, damage to adjacent structures and sepsis.  All of the patient's questions were answered, patient is agreeable to proceed.  The patient has been NPO. Labs and vitals have been reviewed. The patient has been  on a heparin infusion and this was discontinued at 1010. The patient is not on any contact or airborne precautions.   Consent signed and in the IR control room.   Thank you for this interesting consult.  I greatly enjoyed meeting Beverly Howell and look forward to participating in their care.  A copy of this report was sent to the requesting provider on this date.  Electronically Signed: Alwyn Ren, AGACNP-BC [phone] 05/20/2020, 10:15 AM   I spent a total of 20 Minutes    in face to face in clinical consultation, greater than 50% of which was counseling/coordinating care for image-guided right chest tube placement(s).

## 2020-05-20 NOTE — Progress Notes (Signed)
ANTICOAGULATION and ANTI-BIOTIC CONSULT NOTE   Pharmacy Consult for Heparin  Indication: Recent DVT, Recent CVA   Pharmacy Consult for Vancomycin Indication: HCAP  No Known Allergies  Vital Signs: Temp: 98.9 F (37.2 C) (12/06 1859) Temp Source: Oral (12/06 1859) BP: 132/67 (12/07 0230) Pulse Rate: 67 (12/07 0230)  Labs: Recent Labs    05/19/20 2129 05/19/20 2309  HGB 12.3  --   HCT 40.2  --   PLT 681*  --   CREATININE 0.86  --   TROPONINIHS 12 12     Assessment: 79 y/o F with shortness of breath, recent COVID, recent DVT on Apixaban PTA, will be holding apixaban and starting heparin for now. It has been >12 hours since last apixaban dose, will start heparin now. CBC/renal function good.   CT with multi-focal PNA, ?necrotizing PNA.   Goal of Therapy:  Heparin level 0.3-0.5 units/ml, recent CVA APTT 66-84 secs Monitor platelets by anticoagulation protocol: Yes   Plan:  Start heparin drip at 900 units/hr 1100 HL/aPTT Daily CBC/HL/aPTT Monitor for bleeding  Vancomycin 1000 mg IV x 1, then 500 mg IV q12h Cefepime per MD Trend WBC, temp, renal function  F/U infectious work-up Drug levels as indicated  Abran Duke, PharmD, BCPS Clinical Pharmacist Phone: [phone]

## 2020-05-20 NOTE — Progress Notes (Signed)
Per Dr. Elby Showers, IR is still planning to place right chest tubes this evening/tonight. Patient seen at the bedside. She has not eaten and the heparin has not been restarted. I spoke with Fransico Michael, RN and he is aware of the IR plan. Please keep patient NPO and do no restart heparin. IR will call if there are any changes to this plan.   Please call IR with any questions.  Alwyn Ren, [place] [phone] 05/20/2020, 5:45 PM

## 2020-05-20 NOTE — ED Provider Notes (Signed)
I assumed care of this patient.  Please see previous provider note for further details of Hx, PE.  Briefly patient is a 79 y.o. female who presented with sudden onset shortness of breath just prior to arrival.  Of note patient had Covid earlier last month and developed DVTs currently on Eliquis.  Chest x-ray notable for likely right Hydropneumothorax. Current plan is to obtain a CT scan to rule out PEs and characterize the hydropneumothorax.  Ultimate dispo is admission.  Patient ultrasound-guided IV infiltrated, which I replaced. CT scan revealed loculated hydropneumothorax on the right with a collapsed lung and evidence concerning for necrotizing pneumonia.  Patient started on empiric antibiotics.  Consult to cardiothoracic surgery placed at 0115a.  Spoke with Dr. Tyrone Sage from cardiothoracic surgery who will evaluate patient in the morning.  Since patient is on Eliquis he is deferring chest tube for now.  Patient will be admitted to hospitalist service for further work-up and management.   Ultrasound ED Peripheral IV (Provider)  Date/Time: 05/20/2020 1:19 AM Performed by: Nira Conn, MD Authorized by: Nira Conn, MD   Procedure details:    Indications: multiple failed IV attempts     Skin Prep: chlorhexidine gluconate     Location:  Right AC   Angiocath:  20 G   Bedside Ultrasound Guided: Yes     Images: not archived     Patient tolerated procedure without complications: Yes     Dressing applied: Yes   .Critical Care Performed by: Nira Conn, MD Authorized by: Nira Conn, MD    CRITICAL CARE Performed by: Amadeo Garnet Cleo Santucci Total critical care time: 40 minutes Critical care time was exclusive of separately billable procedures and treating other patients. Critical care was necessary to treat or prevent imminent or life-threatening deterioration. Critical care was time spent personally by me on the following activities:  development of treatment plan with patient and/or surrogate as well as nursing, discussions with consultants, evaluation of patient's response to treatment, examination of patient, obtaining history from patient or surrogate, ordering and performing treatments and interventions, ordering and review of laboratory studies, ordering and review of radiographic studies, pulse oximetry and re-evaluation of patient's condition.         Nira Conn, MD 05/20/20 [phone]

## 2020-05-20 NOTE — H&P (Signed)
History and Physical    Beverly Howell ACZ:[id number] DOB: 12/17/1940 DOA: 05/19/2020  PCP: System, Provider Not In  Patient coming from: [facility].  Chief Complaint: Shortness of breath hemoptysis.  HPI: Beverly Howell is a 79 y.o. 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  At this point patient was transferred to the ER.  Patient states hemoptysis only slightly blood-tinged.  ED Course: In the ER x-rays showed right-sided hydropneumothorax and a CT angiogram was done which shows hydropneumothorax and necrotizing pneumonia on the right side and on the left side with features concerning for previous no pneumonia from Covid.  On-call cardiothoracic surgeon Dr. Lowella Fairy was consulted.  Requested admission and may change apixaban to Eliquis.  Patient was started on empiric antibiotics after blood cultures obtained.  Labs are significant for leukocytosis.  High sensitive troponins were negative.  Review of Systems: As per HPI, rest all negative.   Past Medical History:  Diagnosis Date  . High cholesterol   . Hypertension     Past Surgical History:  Procedure Laterality Date  . HAND SURGERY       reports that she has quit smoking. She has never used smokeless tobacco. She reports that she does not drink alcohol and does not use drugs.  No Known Allergies  Family History  Problem Relation Age of Onset  . Stroke Mother     Prior to Admission medications   Medication Sig Start Date End Date Taking? Authorizing Provider  amLODipine (NORVASC) 5 MG tablet Take 1 tablet (5 mg total) by mouth daily. 04/29/20   Ghimire, Werner Lean, MD  apixaban (ELIQUIS) 5 MG TABS tablet Take 2 tablets (10 mg total) by mouth 2 (two) times daily for 4 doses. 04/28/20 04/30/20  Ghimire, Werner Lean, MD  apixaban (ELIQUIS) 5 MG TABS tablet Take 1 tablet (5 mg total) by mouth 2 (two) times daily. Start 11/17 evening. 04/30/20   Ghimire, Werner Lean, MD  atorvastatin (LIPITOR) 40 MG tablet Take 1 tablet (40 mg total) by mouth daily. 04/29/20   Ghimire, Werner Lean, MD  Calcium Carb-Cholecalciferol (CALCIUM 600-D PO) Take 1 tablet by mouth daily.    [provider]  cholecalciferol (VITAMIN D) 25 MCG (1000 UNIT) tablet Take 1,000 Units by mouth daily.     [provider]  metoprolol tartrate (LOPRESSOR) 25 MG tablet Take 2 tablets (50 mg total) by mouth 2 (two) times daily. 04/28/20   Ghimire, Werner Lean, MD  Omega-3 Fatty Acids (FISH OIL TRIPLE STRENGTH) 1400 MG CAPS Take 1,400 mg by mouth daily.    [provider]  pantoprazole (PROTONIX) 40 MG tablet Take 1 tablet (40 mg total) by mouth daily. 04/29/20   Ghimire, Werner Lean, MD  predniSONE (DELTASONE) 10 MG tablet Take 40 mg daily for 1 day, 30 mg daily for 1 day, 20 mg daily for 1 days,10 mg daily for 1 day, then stop 04/28/20   Maretta Bees, MD    Physical Exam: Constitutional: Moderately built and nourished. Vitals:   05/20/20 0030 05/20/20 0045 05/20/20 0130 05/20/20 0200  BP: (!) 122/95 130/61 (!) 141/61 129/68  Pulse: (!) 37 62 66  73  Resp: (!) 27 17 (!) 23 (!) 22  Temp:      TempSrc:      SpO2: 100% 100% 99% 98%   Eyes: Anicteric no pallor. ENMT: No discharge from the ears eyes nose or mouth. Neck: No mass felt.  No neck rigidity. Respiratory: No rhonchi or crepitations. Cardiovascular: S1-S2 heard. Abdomen: Soft nontender bowel sounds present. Musculoskeletal: No edema. Skin: No rash. Neurologic: Alert awake oriented to time place and person.  Moves all extremities. Psychiatric: Appears normal.  Normal affect.   Labs on Admission: I have  personally reviewed following labs and imaging studies  CBC: Recent Labs  Lab 05/19/20 2129  WBC 18.5*  NEUTROABS 14.3*  HGB 12.3  HCT 40.2  MCV 101.3*  PLT 681*   Basic Metabolic Panel: Recent Labs  Lab 05/19/20 2129  NA 143  K 3.7  CL 104  CO2 27  GLUCOSE 99  BUN 13  CREATININE 0.86  CALCIUM 9.2   GFR: CrCl cannot be calculated (Unknown ideal weight.). Liver Function Tests: Recent Labs  Lab 05/19/20 2129  AST 26  ALT 29  ALKPHOS 82  BILITOT 0.7  PROT 6.7  ALBUMIN 2.5*   No results for input(s): LIPASE, AMYLASE in the last 168 hours. No results for input(s): AMMONIA in the last 168 hours. Coagulation Profile: No results for input(s): INR, PROTIME in the last 168 hours. Cardiac Enzymes: No results for input(s): CKTOTAL, CKMB, CKMBINDEX, TROPONINI in the last 168 hours. BNP (last 3 results) No results for input(s): PROBNP in the last 8760 hours. HbA1C: No results for input(s): HGBA1C in the last 72 hours. CBG: No results for input(s): GLUCAP in the last 168 hours. Lipid Profile: No results for input(s): CHOL, HDL, LDLCALC, TRIG, CHOLHDL, LDLDIRECT in the last 72 hours. Thyroid Function Tests: No results for input(s): TSH, T4TOTAL, FREET4, T3FREE, THYROIDAB in the last 72 hours. Anemia Panel: No results for input(s): VITAMINB12, FOLATE, FERRITIN, TIBC, IRON, RETICCTPCT in the last 72 hours. Urine analysis:    Component Value Date/Time   COLORURINE AMBER (A) 04/22/2020 1205   APPEARANCEUR HAZY (A) 04/22/2020 1205   LABSPEC 1.026 04/22/2020 1205   PHURINE 5.0 04/22/2020 1205   GLUCOSEU NEGATIVE 04/22/2020 1205   HGBUR NEGATIVE 04/22/2020 1205   BILIRUBINUR NEGATIVE 04/22/2020 1205   KETONESUR 5 (A) 04/22/2020 1205   PROTEINUR 30 (A) 04/22/2020 1205   UROBILINOGEN 0.2 02/03/2014 2029   NITRITE NEGATIVE 04/22/2020 1205   LEUKOCYTESUR NEGATIVE 04/22/2020 1205   Sepsis Labs: @LABRCNTIP (procalcitonin:4,lacticidven:4) )No results found for this or any  previous visit (from the past 240 hour(s)).   Radiological Exams on Admission: CT Angio Chest PE W and/or Wo Contrast  Result Date: 05/20/2020 CLINICAL DATA:  PE suspected. Recent COVID diagnosis with new hypoxemia and hemoptysis. EXAM: CT ANGIOGRAPHY CHEST WITH CONTRAST TECHNIQUE: Multidetector CT imaging of the chest was performed using the standard protocol during bolus administration of intravenous contrast. Multiplanar CT image reconstructions and MIPs were obtained to evaluate the vascular anatomy. CONTRAST:  75 mL of Omnipaque 350 COMPARISON:  None. FINDINGS: Cardiovascular: Contrast injection is sufficient to demonstrate satisfactory opacification of the pulmonary arteries to the segmental level. There is no pulmonary embolus or evidence of right heart strain. The size of the main pulmonary artery is normal. Mild cardiomegaly. The course and caliber of the aorta are normal. There is mild atherosclerotic calcification. Opacification decreased due to pulmonary arterial phase contrast bolus timing. Mediastinum/Nodes: -- No mediastinal lymphadenopathy. -- No hilar lymphadenopathy. -- No  axillary lymphadenopathy. -- No supraclavicular lymphadenopathy. -- Normal thyroid gland where visualized. -  Unremarkable esophagus. Lungs/Pleura: There is a large, loculated, right-sided hydropneumothorax. There is complete collapse of the right lower lobe. There are hypoattenuating areas throughout the collapsed right lower lobe consistent with a necrotizing pneumonia. There are findings suspicious for a bronchopleural fistula arising from the right lower lobe (axial series 8 image 96). There is complete collapse of the right middle lobe. There is near complete collapse of the right upper lobe. Extensive consolidation and atelectasis is noted throughout the right middle and right upper lobes. There is shift of the mediastinum to the left. Hazy, coarse airspace opacities are noted involving the left lung field without  evidence for left-sided pneumothorax or significant pleural effusion. Upper Abdomen: Contrast bolus timing is not optimized for evaluation of the abdominal organs. The visualized portions of the organs of the upper abdomen are normal. Musculoskeletal: No chest wall abnormality. No bony spinal canal stenosis. Review of the MIP images confirms the above findings. IMPRESSION: 1. No acute pulmonary embolism. 2. Large right-sided, loculated, hydropneumothorax. 3. 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 4. Complete collapse of the right middle lobe and partial collapse of the right upper lobe. There are nonenhancing components within the right middle lobe and right upper lobe concerning for multifocal pneumonia. 5. Scattered coarse airspace opacities are noted throughout the left lung field consistent with the patient's history of recent COVID-19 pneumonia. Aortic Atherosclerosis (ICD10-I70.0). Electronically Signed   By: Katherine Mantlehristopher  Green M.D.   On: 05/20/2020 00:22   DG Chest Portable 1 View  Result Date: 05/19/2020 CLINICAL DATA:  Sudden onset short of breath with hemoptysis EXAM: PORTABLE CHEST 1 VIEW COMPARISON:  04/21/2020 FINDINGS: Interval hyper lucent right thorax with meniscus levels at the right base concerning for moderate to large hydropneumothorax. Patchy consolidation and ground-glass density in the left lower lung without significant change. Stable cardiomediastinal silhouette with aortic atherosclerosis. IMPRESSION: 1. Increased hyperlucency of the right thorax with meniscus levels at the right base concerning for moderate to large hydropneumothorax. CT chest recommended for further evaluation. 2. Stable patchy consolidation and ground-glass density in the left lower lung. Critical Value/emergent results were called by telephone at the time of interpretation on 05/19/2020 at 7:39 pm to  [facility] , who verbally acknowledged these results. Electronically Signed   By: Jasmine PangKim  Fujinaga M.D.   On: 05/19/2020 19:39    EKG: Independently reviewed.  Normal sinus rhythm.  RBBB.  Assessment/Plan Principal Problem:   Necrotizing pneumonia (HCC) Active Problems:   Essential hypertension   History of CVA in adulthood   History of DVT of lower extremity    1. Right-sided hydropneumothorax with necrotizing pneumonia for which patient is on empiric antibiotics.  On-call cardiothoracic surgeon Dr. Tyrone SageGerhardt has been notified.  They will be seeing patient in consult.  Requested changing apixaban to heparin.  We will closely monitor in stepdown.  Follow sputum cultures. 2. History of recent CVA and DVT was on apixaban which we will convert to heparin for now in anticipation of possible procedure.  Closely monitor for any worsening hemoptysis.  Patient is also on statins for CVA. 3. Hypertension on beta-blockers and amlodipine. 4. Recent Covid infection discharged 3 weeks ago was on steroids.  Given that patient has hydropneumothorax with necrotizing pneumonia will need close monitoring for any further worsening in inpatient status.   DVT prophylaxis: Heparin infusion.  Code Status: Full code. Family Communication: We will need to discuss with daughter. Disposition Plan: To be determined. Consults called: Cardiothoracic surgery. Admission status: Inpatient.   Eduard Clos MD [facility] Pager [phone].  If 7PM-7AM, please contact night-coverage [email or web address] Password [email or web address]  05/20/2020, 2:27 AM

## 2020-05-20 NOTE — Progress Notes (Signed)
Pharmacy Antibiotic Note  Beverly Howell is a 79 y.o. female admitted on 05/19/2020 with concern for PNA. Pharmacy has been consulted for Vancomycin dosing along with Cefepime + Azithromycin per MD.   Plan: - Adjust Vancomycin to 1g IV every 12 hours - Continue Cefepime and Azithromycin per MD - Will continue to follow renal function, culture results, LOT, and antibiotic de-escalation plans   Height: 5\' 5"  (165.1 cm) Weight: 98 kg (216 lb) IBW/kg (Calculated) : 57  Temp (24hrs), Avg:98.5 F (36.9 C), Min:97.9 F (36.6 C), Max:98.9 F (37.2 C)  Recent Labs  Lab 05/19/20 2116 05/19/20 2129 05/19/20 2309  WBC  --  18.5*  --   CREATININE  --  0.86  --   LATICACIDVEN 2.0*  --  1.6    Estimated Creatinine Clearance: 62.5 mL/min (by C-G formula based on SCr of 0.86 mg/dL).    No Known Allergies  Antimicrobials this admission: Vanc 12/7 >> Cefepime 12/7 >> Azithro 12/7 >>  Microbiology results: 12/7 BCx >> 12/7 RCx >>  Thank you for allowing pharmacy to be a part of this patient's care.  14/7, PharmD, BCPS Clinical Pharmacist Clinical phone for 05/20/2020: [phone] 05/20/2020 2:04 PM   **Pharmacist phone directory can now be found on [email or web address] (PW [email or web address]).  Listed under [facility].

## 2020-05-20 NOTE — Progress Notes (Signed)
ANTICOAGULATION CONSULT NOTE   Pharmacy Consult for Heparin  Indication: Recent DVT, Recent CVA   No Known Allergies  Vital Signs: Temp: 97.9 F (36.6 C) (12/07 1039) Temp Source: Oral (12/07 1039) BP: 138/90 (12/07 1950) Pulse Rate: 83 (12/07 1950)   Weight: 98 kg Height:  65 inches Heparin Dosing Weight: 79.3 kg  Labs: Recent Labs    05/19/20 2129 05/19/20 2309 05/20/20 1237  HGB 12.3  --   --   HCT 40.2  --   --   PLT 681*  --   --   APTT  --   --  39*  CREATININE 0.86  --   --   TROPONINIHS 12 12  --     Assessment: 79 yr old female with shortness of breath, recent COVID, recent DVT on apixaban PTA; apixaban on hold for IR procedure and pharmacy was consulted to dose IV heparin. Of note, patient required placement of two chest tubes this evening for which IV heparin was held. Now s/p IR. Per IR, okay to resume IV heparin now.    Goal of Therapy:  Heparin level 0.3-0.5 units/ml, recent CVA APTT 66-84 secs Monitor platelets by anticoagulation protocol: Yes   Plan:  Restart heparin infusion at previous rate (900 units/hr) Check aPTT, heparin level in 8 hrs Monitor daily aPTT, heparin level, CBC  Monitor for signs/symptoms of bleeding F/U transition back to apixaban when able  Vinnie Level, PharmD., BCPS, BCCCP Clinical Pharmacist Please refer to [facility] for unit-specific pharmacist

## 2020-05-20 NOTE — Progress Notes (Signed)
PROGRESS NOTE                                                                             PROGRESS NOTE                                                                                                                                                                                                             Patient Demographics:    Beverly Howell, is a 79 y.o. female, DOB - 02/07/1941, ZOX:[id number]  Outpatient Primary MD for the patient is System, Provider Not In    LOS - 0  Admit date - 05/19/2020    Chief Complaint  Patient presents with  . Shortness of Breath  . Hematemesis       Brief Narrative    Is a no charge note as patient was seen, and admitted earlier today by Dr. Toniann Fail, chart, imaging and labs were reviewed.  HPI: Beverly Howell is a 79 y.o. 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  At this point patient was transferred to the ER.  Patient states hemoptysis only slightly blood-tinged.  ED Course: In the ER x-rays showed right-sided hydropneumothorax and a CT angiogram was done which shows hydropneumothorax and necrotizing pneumonia on the right side and on the left side with features concerning for previous no pneumonia from Covid.  On-call cardiothoracic surgeon Dr. Lowella Fairy was consulted.  Requested admission and may change apixaban to Eliquis.  Patient was started on empiric antibiotics after blood cultures obtained.  Labs are significant for leukocytosis.  High sensitive troponins were negative.    Subjective:    Beverly Howell today for generalized weakness, fatigue,  and some shortness of breath denies any  nausea, vomiting.  Further hemoptysis since admission.   Assessment  & Plan :    Principal Problem:   Necrotizing pneumonia (HCC) Active Problems:   Essential hypertension   History of CVA in adulthood   History of DVT of lower extremity  Right-sided hydropneumothorax with necrotizing pneumonia -Continue with broad-spectrum IV antibiotics, follow on blood culture and sputum cultures. . -cardiothoracic surgeon Dr. Tyrone Sage consult greatly appreciated , recommendation for CT/ultrasound-guided CT tube placement, and culture of the fluids . -IR consulted, plan for apical and basal pigtail chest tubes in the setting of large hydropneumothorax. -Follow-up cultures and adjust antibiotic as needed.  History of recent CVA and DVT -During recent admission in November, she was on apixaban, she is currently on heparin GTT for anticipated procedures. -   Closely monitor for any worsening hemoptysis.  -  Patient is also on statins for CVA.  Hypertension on beta-blockers and amlodipine.  Recent Covid infection discharged 3 weeks ago was on steroids.  She is currently off isolation.     SpO2: 99 % O2 Flow Rate (L/min): 3 L/min  Recent Labs  Lab 05/19/20 2116 05/19/20 2129 05/19/20 2309  WBC  --  18.5*  --   PLT  --  681*  --   AST  --  26  --   ALT  --  29  --   ALKPHOS  --  82  --   BILITOT  --  0.7  --   ALBUMIN  --  2.5*  --   LATICACIDVEN 2.0*  --  1.6       ABG     Component Value Date/Time   TCO2 27 04/21/2020 2117         Condition - Extremely Guarded  Family Communication  :  None at bedsid  Code Status :  Full  Consults  :  CT surgery, IR  Procedures  :  none  PUD Prophylaxis : protonix  Disposition Plan  :    Status is: Inpatient  Remains inpatient appropriate because:IV treatments appropriate due to intensity of illness or inability to take PO   Dispo: The patient is from: Home              Anticipated  d/c is to: Home              Anticipated d/c date is: > 3 days              Patient currently is not medically stable to d/c.      DVT Prophylaxis  :  Heparin GTT  Lab Results  Component Value Date   PLT 681 (H) 05/19/2020    Diet :  Diet Order            Diet NPO time specified Except for: Sips with Meds  Diet effective now                  Inpatient Medications  Scheduled Meds: . amLODipine  5 mg Oral Daily  . atorvastatin  40 mg Oral Daily  . ipratropium-albuterol  3 mL Nebulization QID  . metoprolol tartrate  50 mg Oral BID  . mometasone-formoterol  2 puff Inhalation BID  . sodium chloride flush  10-40 mL Intracatheter Q12H   Continuous Infusions: . azithromycin Stopped (05/20/20 0505)  . ceFEPime (MAXIPIME) IV Stopped (05/20/20 1010)  . heparin Stopped (05/20/20 1010)  . vancomycin     PRN Meds:.acetaminophen **OR** acetaminophen, sodium chloride flush  Antibiotics  :  Anti-infectives (From admission, onward)   Start     Dose/Rate Route Frequency Ordered Stop   05/20/20 2200  vancomycin (VANCOCIN) IVPB 1000 mg/200 mL premix        1,000 mg 200 mL/hr over 60 Minutes Intravenous Every 12 hours 05/20/20 1401     05/20/20 1000  ceFEPIme (MAXIPIME) 2 g in sodium chloride 0.9 % 100 mL IVPB        2 g 200 mL/hr over 30 Minutes Intravenous Every 8 hours 05/20/20 0226 05/25/20 0959   05/20/20 1000  vancomycin (VANCOREADY) IVPB 500 mg/100 mL  Status:  Discontinued        500 mg 100 mL/hr over 60 Minutes Intravenous Every 12 hours 05/20/20 0240 05/20/20 1401   05/20/20 0230  azithromycin (ZITHROMAX) 500 mg in sodium chloride 0.9 % 250 mL IVPB        500 mg 250 mL/hr over 60 Minutes Intravenous Daily at bedtime 05/20/20 0226 05/24/20 2159   05/20/20 0100  vancomycin (VANCOCIN) IVPB 1000 mg/200 mL premix        1,000 mg 200 mL/hr over 60 Minutes Intravenous  Once 05/20/20 0046 05/20/20 0402   05/20/20 0045  ceFEPIme (MAXIPIME) 2 g in sodium chloride 0.9 % 100  mL IVPB        2 g 200 mL/hr over 30 Minutes Intravenous  Once 05/20/20 0030 05/20/20 0221       Time Spent in minutes  : THIS IS A NO CHARGE NOTE   Huey Bienenstock M.D on 05/20/2020 at 2:58 PM  To page go to [email or web address]   [facility] -  Office  [phone]      Objective:   Vitals:   05/20/20 1039 05/20/20 1315 05/20/20 1330 05/20/20 1415  BP: (!) 142/77 (!) 145/75 (!) 159/90 140/69  Pulse: 70 64 75 (!) 57  Resp: (!) 23 16 (!) 25 16  Temp: 97.9 F (36.6 C)     TempSrc: Oral     SpO2: 99% 99% 96% 99%  Weight:   98 kg   Height:   5\' 5"  (1.651 m)     Wt Readings from Last 3 Encounters:  05/20/20 98 kg  04/27/20 98.2 kg  03/26/17 86.2 kg     Intake/Output Summary (Last 24 hours) at 05/20/2020 1458 Last data filed at 05/20/2020 1338 Gross per 24 hour  Intake 450 ml  Output --  Net 450 ml     Physical Exam  Awake Alert, frail,and deconditioned Symmetrical Chest wall movement, diminished air entry in the right lung RRR,No Gallops,Rubs or new Murmurs, No Parasternal Heave +ve B.Sounds, Abd Soft, No tenderness, , No rebound - guarding or rigidity. No Cyanosis, Clubbing , has trace edema, No new Rash or bruise      Data Review:    CBC Recent Labs  Lab 05/19/20 2129  WBC 18.5*  HGB 12.3  HCT 40.2  PLT 681*  MCV 101.3*  MCH 31.0  MCHC 30.6  RDW 16.7*  LYMPHSABS 2.2  MONOABS 1.8*  EOSABS 0.1  BASOSABS 0.0    Recent Labs  Lab 05/19/20 2116 05/19/20 2129 05/19/20 2309  NA  --  143  --   K  --  3.7  --   CL  --  104  --   CO2  --  27  --   GLUCOSE  --  99  --   BUN  --  13  --   CREATININE  --  0.86  --   CALCIUM  --  9.2  --   AST  --  26  --   ALT  --  29  --   ALKPHOS  --  82  --   BILITOT  --  0.7  --   ALBUMIN  --  2.5*  --   LATICACIDVEN 2.0*  --  1.6    ------------------------------------------------------------------------------------------------------------------ No results for input(s): CHOL, HDL, LDLCALC, TRIG,  CHOLHDL, LDLDIRECT in the last 72 hours.  Lab Results  Component Value Date   HGBA1C 5.5 04/22/2020   ------------------------------------------------------------------------------------------------------------------ No results for input(s): TSH, T4TOTAL, T3FREE, THYROIDAB in the last 72 hours.  Invalid input(s): FREET3  Cardiac Enzymes No results for input(s): CKMB, TROPONINI, MYOGLOBIN in the last 168 hours.  Invalid input(s): CK ------------------------------------------------------------------------------------------------------------------    Component Value Date/Time   BNP 103.4 (H) 04/25/2020 1610    Micro Results No results found for this or any previous visit (from the past 240 hour(s)).  Radiology Reports DG Abd 1 View  Result Date: 04/22/2020 CLINICAL DATA:  MRI clearance.  Altered mental status. EXAM: ABDOMEN - 1 VIEW COMPARISON:  None. FINDINGS: The bowel gas pattern is normal. No radio-opaque calculi or other significant radiographic abnormality are seen. There appear to be hazy airspace opacities involving the lungs bilaterally. No metallic foreign body identified. There are degenerative changes of both hips, right worse than left. There are degenerative changes of the lumbar spine. IMPRESSION: 1. No metallic foreign body identified. 2. Hazy airspace opacities involving the lungs bilaterally. 3. Nonobstructive bowel gas pattern. Electronically Signed   By: Katherine Mantle M.D.   On: 04/22/2020 00:23   CT Head Wo Contrast  Result Date: 04/21/2020 CLINICAL DATA:  79 year old female with intermittent dysarthria and left side weakness x1 week, progressive since 1400 hours on 04/20/2020. Hypoxia on initial presentation. EXAM: CT HEAD WITHOUT CONTRAST TECHNIQUE: Contiguous axial images were obtained from the base of the skull through the vertex without intravenous contrast. COMPARISON:  None. FINDINGS: Brain: Small chronic infarct in the left cerebellum (series 3, image 8).  No acute intracranial hemorrhage identified. No midline shift, mass effect, or evidence of intracranial mass lesion. No ventriculomegaly. Mild for age scattered bilateral white matter hypodensity. Small age indeterminate hypodense area in the posterior right insula on series 3, image 15. No other no acute or subacute cortically based infarct identified. No supratentorial cortical encephalomalacia identified. Deep gray matter nuclei and brainstem appear within normal limits. Vascular: Calcified atherosclerosis at the skull base. No suspicious intracranial vascular hyperdensity. Skull: No acute osseous abnormality identified. Sinuses/Orbits: Small fluid level in the left maxillary sinus. Bubbly opacity there as well as in the right maxillary and sphenoid sinuses. Bubbly opacity in the posterior left ethmoid. Tympanic cavities and mastoids appear clear. Other: No acute orbit or scalp soft tissue finding identified. IMPRESSION: 1. Small infarct at the posterior right insula, age indeterminate but suspicious for subacute ischemia in this setting. No associated hemorrhage or mass effect. 2. Elsewhere mild for age cerebral white matter changes, and a small chronic left cerebellar infarct. 3. Acute paranasal sinus inflammation. Electronically Signed   By: Odessa Fleming M.D.   On: 04/21/2020 22:56   CT Angio Chest PE W and/or Wo Contrast  Result Date: 05/20/2020 CLINICAL DATA:  PE suspected. Recent COVID diagnosis with new hypoxemia and hemoptysis. EXAM: CT ANGIOGRAPHY CHEST WITH CONTRAST TECHNIQUE: Multidetector CT imaging of the chest was performed using the standard protocol during bolus administration of intravenous contrast. Multiplanar CT image reconstructions and MIPs were obtained to evaluate the  vascular anatomy. CONTRAST:  75 mL of Omnipaque 350 COMPARISON:  None. FINDINGS: Cardiovascular: Contrast injection is sufficient to demonstrate satisfactory opacification of the pulmonary arteries to the segmental level.  There is no pulmonary embolus or evidence of right heart strain. The size of the main pulmonary artery is normal. Mild cardiomegaly. The course and caliber of the aorta are normal. There is mild atherosclerotic calcification. Opacification decreased due to pulmonary arterial phase contrast bolus timing. Mediastinum/Nodes: -- No mediastinal lymphadenopathy. -- No hilar lymphadenopathy. -- No axillary lymphadenopathy. -- No supraclavicular lymphadenopathy. -- Normal thyroid gland where visualized. -  Unremarkable esophagus. Lungs/Pleura: There is a large, loculated, right-sided hydropneumothorax. There is complete collapse of the right lower lobe. There are hypoattenuating areas throughout the collapsed right lower lobe consistent with a necrotizing pneumonia. There are findings suspicious for a bronchopleural fistula arising from the right lower lobe (axial series 8 image 96). There is complete collapse of the right middle lobe. There is near complete collapse of the right upper lobe. Extensive consolidation and atelectasis is noted throughout the right middle and right upper lobes. There is shift of the mediastinum to the left. Hazy, coarse airspace opacities are noted involving the left lung field without evidence for left-sided pneumothorax or significant pleural effusion. Upper Abdomen: Contrast bolus timing is not optimized for evaluation of the abdominal organs. The visualized portions of the organs of the upper abdomen are normal. Musculoskeletal: No chest wall abnormality. No bony spinal canal stenosis. Review of the MIP images confirms the above findings. IMPRESSION: 1. No acute pulmonary embolism. 2. Large right-sided, loculated, hydropneumothorax. 3. 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 4. Complete collapse of the right middle lobe and partial collapse of the right upper lobe. There are  nonenhancing components within the right middle lobe and right upper lobe concerning for multifocal pneumonia. 5. Scattered coarse airspace opacities are noted throughout the left lung field consistent with the patient's history of recent COVID-19 pneumonia. Aortic Atherosclerosis (ICD10-I70.0). Electronically Signed   By: Katherine Mantle M.D.   On: 05/20/2020 00:22   MR ANGIO HEAD WO CONTRAST  Result Date: 04/22/2020 CLINICAL DATA:  Follow-up examination for acute stroke. EXAM: MRI HEAD WITHOUT CONTRAST MRA HEAD WITHOUT CONTRAST TECHNIQUE: Multiplanar, multiecho pulse sequences of the brain and surrounding structures were obtained without intravenous contrast. Angiographic images of the head were obtained using MRA technique without contrast. COMPARISON:  Prior CT from 04/21/2020. FINDINGS: MRI HEAD FINDINGS Brain: Examination degraded by motion artifact. Generalized age-related cerebral atrophy. Mild chronic microvascular ischemic disease present within the periventricular and deep white matter both cerebral hemispheres. Small remote left cerebellar infarct noted. Patchy restricted diffusion seen involving the posterior right insula as well as the overlying right posterior frontal and parietal lobes, consistent with right MCA territory infarcts. No significant mass effect or associated hemorrhage. An embolic etiology is suspected given distribution. No other diffusion abnormality to suggest acute or subacute ischemia. Gray-white matter differentiation otherwise maintained. No other areas of encephalomalacia to suggest chronic cortical infarction on this motion degraded exam. No other definite foci of susceptibility artifact to suggest acute or chronic intracranial hemorrhage. No mass lesion, midline shift or mass effect. No hydrocephalus or extra-axial fluid collection. No made of a partially empty sella. Midline structures intact. Vascular: Major intracranial vascular flow voids are maintained. Skull and  upper cervical spine: Craniocervical junction within normal limits. Multilevel cervical spondylosis noted within the upper cervical  spine without high-grade stenosis. Bone marrow signal intensity normal. No scalp soft tissue abnormality. Sinuses/Orbits: Globes and orbital soft tissues within normal limits. Scattered mucosal thickening noted throughout the ethmoidal air cells and maxillary sinuses. Superimposed air-fluid level noted within the left maxillary sinus. Trace bilateral mastoid effusions. Visualized nasopharynx within normal limits. Inner ear structures grossly normal. Other: None. MRA HEAD FINDINGS ANTERIOR CIRCULATION: Examination somewhat degraded by motion artifact. Visualized distal cervical segments of the internal carotid arteries are widely patent with antegrade flow. Petrous, cavernous, and supraclinoid ICAs demonstrate probable atheromatous irregularity without appreciable hemodynamically significant stenosis. A1 segments patent bilaterally. Normal anterior communicating artery complex. Anterior cerebral arteries patent to their distal aspects without stenosis. No M1 stenosis or occlusion. Normal MCA bifurcations. Distal MCA branches well perfused and fairly symmetric. POSTERIOR CIRCULATION: Vertebral arteries patent to the vertebrobasilar junction without stenosis. Both PICAs patent proximally. Basilar patent to its distal aspect without stenosis. Superior cerebral arteries patent bilaterally. Right PCA supplied via a hypoplastic right P1 segment and prominent right posterior communicating artery. Fetal type origin left PCA. Both PCAs perfused to their distal aspects without proximal flow-limiting stenosis. No aneurysm. IMPRESSION: MRI HEAD IMPRESSION: 1. 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 An embolic etiology is suspected. No significant mass effect. 2. Underlying  age-related cerebral atrophy with mild chronic small vessel ischemic disease. Small remote left cerebellar infarct. MRA HEAD IMPRESSION: 1. Technically limited exam due to motion artifact. 2. Negative intracranial MRA for large vessel occlusion. No hemodynamically significant or correctable stenosis. 3. Mild atheromatous irregularity about the carotid siphons without significant stenosis. Electronically Signed   By: Rise MuBenjamin  McClintock M.D.   On: 04/22/2020 01:29   MR BRAIN WO CONTRAST  Result Date: 04/22/2020 CLINICAL DATA:  Follow-up examination for acute stroke. EXAM: MRI HEAD WITHOUT CONTRAST MRA HEAD WITHOUT CONTRAST TECHNIQUE: Multiplanar, multiecho pulse sequences of the brain and surrounding structures were obtained without intravenous contrast. Angiographic images of the head were obtained using MRA technique without contrast. COMPARISON:  Prior CT from 04/21/2020. FINDINGS: MRI HEAD FINDINGS Brain: Examination degraded by motion artifact. Generalized age-related cerebral atrophy. Mild chronic microvascular ischemic disease present within the periventricular and deep white matter both cerebral hemispheres. Small remote left cerebellar infarct noted. Patchy restricted diffusion seen involving the posterior right insula as well as the overlying right posterior frontal and parietal lobes, consistent with right MCA territory infarcts. No significant mass effect or associated hemorrhage. An embolic etiology is suspected given distribution. No other diffusion abnormality to suggest acute or subacute ischemia. Gray-white matter differentiation otherwise maintained. No other areas of encephalomalacia to suggest chronic cortical infarction on this motion degraded exam. No other definite foci of susceptibility artifact to suggest acute or chronic intracranial hemorrhage. No mass lesion, midline shift or mass effect. No hydrocephalus or extra-axial fluid collection. No made of a partially empty sella. Midline  structures intact. Vascular: Major intracranial vascular flow voids are maintained. Skull and upper cervical spine: Craniocervical junction within normal limits. Multilevel cervical spondylosis noted within the upper cervical spine without high-grade stenosis. Bone marrow signal intensity normal. No scalp soft tissue abnormality. Sinuses/Orbits: Globes and orbital soft tissues within normal limits. Scattered mucosal thickening noted throughout the ethmoidal air cells and maxillary sinuses. Superimposed air-fluid level noted within the left maxillary sinus. Trace bilateral mastoid effusions. Visualized nasopharynx within normal limits. Inner ear structures grossly normal. Other: None. MRA HEAD FINDINGS ANTERIOR CIRCULATION: Examination somewhat degraded by motion  artifact. Visualized distal cervical segments of the internal carotid arteries are widely patent with antegrade flow. Petrous, cavernous, and supraclinoid ICAs demonstrate probable atheromatous irregularity without appreciable hemodynamically significant stenosis. A1 segments patent bilaterally. Normal anterior communicating artery complex. Anterior cerebral arteries patent to their distal aspects without stenosis. No M1 stenosis or occlusion. Normal MCA bifurcations. Distal MCA branches well perfused and fairly symmetric. POSTERIOR CIRCULATION: Vertebral arteries patent to the vertebrobasilar junction without stenosis. Both PICAs patent proximally. Basilar patent to its distal aspect without stenosis. Superior cerebral arteries patent bilaterally. Right PCA supplied via a hypoplastic right P1 segment and prominent right posterior communicating artery. Fetal type origin left PCA. Both PCAs perfused to their distal aspects without proximal flow-limiting stenosis. No aneurysm. IMPRESSION: MRI HEAD IMPRESSION: 1. Patchy small volume acute ischemic nonhemorrhagic right MCA territory infarcts involving the posterior right insula and overlying right frontoparietal  region. An embolic etiology is suspected. No significant mass effect. 2. Underlying age-related cerebral atrophy with mild chronic small vessel ischemic disease. Small remote left cerebellar infarct. MRA HEAD IMPRESSION: 1. Technically limited exam due to motion artifact. 2. Negative intracranial MRA for large vessel occlusion. No hemodynamically significant or correctable stenosis. 3. Mild atheromatous irregularity about the carotid siphons without significant stenosis. Electronically Signed   By: Rise Mu M.D.   On: 04/22/2020 01:29   DG Chest Portable 1 View  Result Date: 05/19/2020 CLINICAL DATA:  Sudden onset short of breath with hemoptysis EXAM: PORTABLE CHEST 1 VIEW COMPARISON:  04/21/2020 FINDINGS: Interval hyper lucent right thorax with meniscus levels at the right base concerning for moderate to large hydropneumothorax. Patchy consolidation and ground-glass density in the left lower lung without significant change. Stable cardiomediastinal silhouette with aortic atherosclerosis. IMPRESSION: 1. Increased hyperlucency of the right thorax with meniscus levels at the right base concerning for moderate to large hydropneumothorax. CT chest recommended for further evaluation. 2. Stable patchy consolidation and ground-glass density in the left lower lung. Critical Value/emergent results were called by telephone at the time of interpretation on 05/19/2020 at 7:39 pm to provider [facility] , who verbally acknowledged these results. Electronically Signed   By: Jasmine Pang M.D.   On: 05/19/2020 19:39   DG Chest Portable 1 View  Result Date: 04/21/2020 CLINICAL DATA:  Hypoxia EXAM: PORTABLE CHEST 1 VIEW COMPARISON:  None. FINDINGS: The heart size is enlarged. There are hazy bilateral airspace opacities. There is no pneumothorax. No large pleural effusion. There are end-stage degenerative changes of both glenohumeral joints. IMPRESSION: Cardiomegaly with hazy bilateral airspace opacities  concerning for pulmonary edema or an atypical infectious process. Electronically Signed   By: Katherine Mantle M.D.   On: 04/21/2020 20:21   ECHOCARDIOGRAM COMPLETE  Result Date: 04/23/2020    ECHOCARDIOGRAM REPORT   Patient Name:   MICHAL STRZELECKI Date of Exam: 04/23/2020 Medical Rec #:  [id number]     Height:       65.0 in Accession #:    [id number]    Weight:       183.4 lb Date of Birth:  [birth year]      BSA:          1.907 m Patient Age:    79 years      BP:           151/94 mmHg Patient Gender: F             HR:           79 bpm. Exam Location:  Inpatient Procedure: 2D  Echo, Cardiac Doppler and Color Doppler Indications:    Stroke 434.91 / I163.9  History:        Patient has no prior history of Echocardiogram examinations.                 Risk Factors:Hypertension and Dyslipidemia. COVID-19 Positive.  Sonographer:    Elmarie Shiley Dance Referring Phys: [id number] ARSHAD N KAKRAKANDY IMPRESSIONS  1. Left ventricular ejection Howell, by estimation, is 60 to 65%. The left ventricle has normal function. The left ventricle has no regional wall motion abnormalities. Left ventricular diastolic parameters are consistent with Grade I diastolic dysfunction (impaired relaxation).  2. Right ventricular systolic function is normal. The right ventricular size is normal. There is normal pulmonary artery systolic pressure. The estimated right ventricular systolic pressure is 27.4 mmHg.  3. The mitral valve is normal in structure. Trivial mitral valve regurgitation. No evidence of mitral stenosis.  4. The aortic valve is normal in structure. There is moderate calcification of the aortic valve. There is moderate thickening of the aortic valve. Aortic valve regurgitation is trivial. Mild to moderate aortic valve sclerosis/calcification is present, without any evidence of aortic stenosis. Aortic valve mean gradient measures 8.5 mmHg. Aortic valve Vmax measures 2.13 m/s.  5. The inferior vena cava is normal in size with greater than 50%  respiratory variability, suggesting right atrial pressure of 3 mmHg. Conclusion(s)/Recommendation(s): No intracardiac source of embolism detected on this transthoracic study. A transesophageal echocardiogram is recommended to exclude cardiac source of embolism if clinically indicated. FINDINGS  Left Ventricle: Left ventricular ejection Howell, by estimation, is 60 to 65%. The left ventricle has normal function. The left ventricle has no regional wall motion abnormalities. The left ventricular internal cavity size was normal in size. There is  no left ventricular hypertrophy. Left ventricular diastolic parameters are consistent with Grade I diastolic dysfunction (impaired relaxation). Right Ventricle: The right ventricular size is normal. No increase in right ventricular wall thickness. Right ventricular systolic function is normal. There is normal pulmonary artery systolic pressure. The tricuspid regurgitant velocity is 2.47 m/s, and  with an assumed right atrial pressure of 3 mmHg, the estimated right ventricular systolic pressure is 27.4 mmHg. Left Atrium: Left atrial size was normal in size. Right Atrium: Right atrial size was normal in size. Pericardium: There is no evidence of pericardial effusion. Mitral Valve: The mitral valve is normal in structure. Trivial mitral valve regurgitation. No evidence of mitral valve stenosis. Tricuspid Valve: The tricuspid valve is normal in structure. Tricuspid valve regurgitation is mild . No evidence of tricuspid stenosis. Aortic Valve: The aortic valve is normal in structure. There is moderate calcification of the aortic valve. There is moderate thickening of the aortic valve. Aortic valve regurgitation is trivial. Mild to moderate aortic valve sclerosis/calcification is present, without any evidence of aortic stenosis. Aortic valve mean gradient measures 8.5 mmHg. Aortic valve peak gradient measures 18.2 mmHg. Aortic valve area, by VTI measures 1.58 cm. Pulmonic Valve:  The pulmonic valve was normal in structure. Pulmonic valve regurgitation is not visualized. No evidence of pulmonic stenosis. Aorta: The aortic root is normal in size and structure. Venous: The inferior vena cava is normal in size with greater than 50% respiratory variability, suggesting right atrial pressure of 3 mmHg. IAS/Shunts: No atrial level shunt detected by color flow Doppler.  LEFT VENTRICLE PLAX 2D LVIDd:         3.90 cm  Diastology LVIDs:         2.72 cm  LV  e' medial:    4.35 cm/s LV PW:         0.92 cm  LV E/e' medial:  14.5 LV IVS:        1.15 cm  LV e' lateral:   6.42 cm/s LVOT diam:     2.00 cm  LV E/e' lateral: 9.8 LV SV:         63 LV SV Index:   33 LVOT Area:     3.14 cm  RIGHT VENTRICLE             IVC RV Basal diam:  2.65 cm     IVC diam: 1.81 cm RV S prime:     12.00 cm/s TAPSE (M-mode): 2.0 cm LEFT ATRIUM             Index       RIGHT ATRIUM          Index LA diam:        3.20 cm 1.68 cm/m  RA Area:     9.10 cm LA Vol (A2C):   56.4 ml 29.58 ml/m RA Volume:   14.70 ml 7.71 ml/m LA Vol (A4C):   59.4 ml 31.15 ml/m LA Biplane Vol: 60.1 ml 31.52 ml/m  AORTIC VALVE AV Area (Vmax):    1.49 cm AV Area (Vmean):   1.39 cm AV Area (VTI):     1.58 cm AV Vmax:           213.50 cm/s AV Vmean:          135.000 cm/s AV VTI:            0.395 m AV Peak Grad:      18.2 mmHg AV Mean Grad:      8.5 mmHg LVOT Vmax:         101.40 cm/s LVOT Vmean:        59.550 cm/s LVOT VTI:          0.199 m LVOT/AV VTI ratio: 0.50  AORTA Ao Root diam: 2.80 cm Ao Asc diam:  3.40 cm MITRAL VALVE                TRICUSPID VALVE MV Area (PHT): 2.45 cm     TR Peak grad:   24.4 mmHg MV Decel Time: 310 msec     TR Vmax:        247.00 cm/s MV E velocity: 62.90 cm/s MV A velocity: 103.00 cm/s  SHUNTS MV E/A ratio:  0.61         Systemic VTI:  0.20 m                             Systemic Diam: 2.00 cm Donato Schultz MD Electronically signed by Donato Schultz MD Signature Date/Time: 04/23/2020/2:48:24 PM    Final    VAS US CAROTID (at  [facility] and [facility] only)  Result Date: 04/22/2020 Carotid Arterial Duplex Study Indications:       CVA and DVT positive. Risk Factors:      Hypertension. Other Factors:     COVID 19 positive. Comparison Study:  No prior studies. Performing Technologist: Chanda Busing RVT  Examination Guidelines: A complete evaluation includes B-mode imaging, spectral Doppler, color Doppler, and power Doppler as needed of all accessible portions of each vessel. Bilateral testing is considered an integral part of a complete examination. Limited examinations for reoccurring indications may be performed as noted.  Right Carotid Findings: +----------+--------+--------+--------+-----------------------+--------+  PSV cm/sEDV cm/sStenosisPlaque Description     Comments +----------+--------+--------+--------+-----------------------+--------+ CCA Prox  56      5               smooth and heterogenous         +----------+--------+--------+--------+-----------------------+--------+ CCA Distal49      11              smooth and heterogenous         +----------+--------+--------+--------+-----------------------+--------+ ICA Prox  48      14              smooth and heterogenous         +----------+--------+--------+--------+-----------------------+--------+ ICA Distal87      24                                     tortuous +----------+--------+--------+--------+-----------------------+--------+ ECA       59      6                                               +----------+--------+--------+--------+-----------------------+--------+ +----------+--------+-------+--------+-------------------+           PSV cm/sEDV cmsDescribeArm Pressure (mmHG) +----------+--------+-------+--------+-------------------+ [id number]                                         +----------+--------+-------+--------+-------------------+ +---------+--------+--+--------+--+---------+ VertebralPSV cm/s59EDV  cm/s13Antegrade +---------+--------+--+--------+--+---------+  Left Carotid Findings: +----------+--------+--------+--------+-----------------------+--------+           PSV cm/sEDV cm/sStenosisPlaque Description     Comments +----------+--------+--------+--------+-----------------------+--------+ CCA Prox  97      19              smooth and heterogenous         +----------+--------+--------+--------+-----------------------+--------+ CCA Distal49      11              smooth and heterogenous         +----------+--------+--------+--------+-----------------------+--------+ ICA Prox  37      11              smooth and heterogenous         +----------+--------+--------+--------+-----------------------+--------+ ICA Distal59      22                                     tortuous +----------+--------+--------+--------+-----------------------+--------+ ECA       44      5                                               +----------+--------+--------+--------+-----------------------+--------+ +----------+--------+--------+--------+-------------------+           PSV cm/sEDV cm/sDescribeArm Pressure (mmHG) +----------+--------+--------+--------+-------------------+ [id number]                                          +----------+--------+--------+--------+-------------------+ +---------+--------+--+--------+--+---------+ VertebralPSV cm/s33EDV cm/s10Antegrade +---------+--------+--+--------+--+---------+   Summary: Right Carotid: Velocities in the right ICA are consistent with a  1-39% stenosis. Left Carotid: Velocities in the left ICA are consistent with a 1-39% stenosis. Vertebrals: Bilateral vertebral arteries demonstrate antegrade flow. *See table(s) above for measurements and observations.  Electronically signed by Delia Heady MD on 04/22/2020 at 1:17:39 PM.    Final    VAS [place] LOWER EXTREMITY VENOUS (DVT)  Result Date: 04/22/2020  Lower Venous DVT Study  Indications: Elevated Ddimer.  Risk Factors: COVID 19 positive. Comparison Study: No prior studies. Performing Technologist: Chanda Busing RVT  Examination Guidelines: A complete evaluation includes B-mode imaging, spectral Doppler, color Doppler, and power Doppler as needed of all accessible portions of each vessel. Bilateral testing is considered an integral part of a complete examination. Limited examinations for reoccurring indications may be performed as noted. The reflux portion of the exam is performed with the patient in reverse Trendelenburg.  +---------+---------------+---------+-----------+----------+-----------------+ RIGHT    CompressibilityPhasicitySpontaneityPropertiesThrombus Aging    +---------+---------------+---------+-----------+----------+-----------------+ CFV      Full           Yes      Yes                                    +---------+---------------+---------+-----------+----------+-----------------+ SFJ      Full                                                           +---------+---------------+---------+-----------+----------+-----------------+ FV Prox  Full                                                           +---------+---------------+---------+-----------+----------+-----------------+ FV Mid   Partial        No       No                   Age Indeterminate +---------+---------------+---------+-----------+----------+-----------------+ FV DistalNone           No       No                   Age Indeterminate +---------+---------------+---------+-----------+----------+-----------------+ PFV      Full                                                           +---------+---------------+---------+-----------+----------+-----------------+ POP      None           No       No                   Age Indeterminate +---------+---------------+---------+-----------+----------+-----------------+ PTV      None                                          Age Indeterminate +---------+---------------+---------+-----------+----------+-----------------+ PERO     None  Age Indeterminate +---------+---------------+---------+-----------+----------+-----------------+ Gastroc  None                                         Age Indeterminate +---------+---------------+---------+-----------+----------+-----------------+   +---------+---------------+---------+-----------+----------+--------------+ LEFT     CompressibilityPhasicitySpontaneityPropertiesThrombus Aging +---------+---------------+---------+-----------+----------+--------------+ CFV      Full           Yes      Yes                                 +---------+---------------+---------+-----------+----------+--------------+ SFJ      Full                                                        +---------+---------------+---------+-----------+----------+--------------+ FV Prox  Full                                                        +---------+---------------+---------+-----------+----------+--------------+ FV Mid   Full                                                        +---------+---------------+---------+-----------+----------+--------------+ FV DistalFull                                                        +---------+---------------+---------+-----------+----------+--------------+ PFV      Full                                                        +---------+---------------+---------+-----------+----------+--------------+ POP      None           No       No                   Acute          +---------+---------------+---------+-----------+----------+--------------+ PTV      None                                         Acute          +---------+---------------+---------+-----------+----------+--------------+ PERO     None                                         Acute           +---------+---------------+---------+-----------+----------+--------------+    Summary: RIGHT: - Findings consistent  with age indeterminate deep vein thrombosis involving the right femoral vein, right popliteal vein, right posterior tibial veins, right peroneal veins, and right gastrocnemius veins. - No cystic structure found in the popliteal fossa.  LEFT: - Findings consistent with acute deep vein thrombosis involving the left popliteal vein, left posterior tibial veins, and left peroneal veins. - No cystic structure found in the popliteal fossa.  *See table(s) above for measurements and observations. Electronically signed by Gretta Began MD on 04/22/2020 at 2:50:20 PM.    Final

## 2020-05-20 NOTE — ED Notes (Signed)
Heparin gtt paused at this time for IR procedure.

## 2020-05-20 NOTE — Progress Notes (Signed)
IR procedure rescheduled for 05/21/20 due to emergent procedure in IR today. Patient ok to eat but please keep patient NPO after midnight. IR will send for the patient tomorrow as time permits. Please call IR with any questions.  Alwyn Ren, [place] [phone] 05/20/2020, 5:02 PM

## 2020-05-21 ENCOUNTER — Inpatient Hospital Stay (HOSPITAL_COMMUNITY): Payer: Medicare (Managed Care)

## 2020-05-21 DIAGNOSIS — J948 Other specified pleural conditions: Secondary | ICD-10-CM

## 2020-05-21 MED ORDER — ORAL CARE MOUTH RINSE
15.0000 mL | Freq: Two times a day (BID) | OROMUCOSAL | Status: DC
  Administered 2020-05-21 – 2020-06-06 (×26): 15 mL via OROMUCOSAL

## 2020-05-21 MED ORDER — OXYCODONE-ACETAMINOPHEN 5-325 MG PO TABS
1.0000 | ORAL_TABLET | ORAL | Status: DC | PRN
  Administered 2020-05-24: 1 via ORAL
  Filled 2020-05-21 (×3): qty 1

## 2020-05-21 NOTE — Progress Notes (Addendum)
PROGRESS NOTE        [name] Age: 79 y.o. Sex: female Date of Birth: [birth year] Admit Date: 05/19/2020 Admitting Physician Eduard Clos, MD ZOX:[id number], Provider Not In  Brief Narrative: Patient is a 79 y.o. female with history of recent severe COVID-19 infection-along with CVA/lower extremity DVT-presenting to the hospital with shortness of breath-found to have necrotizing right-sided pneumonia with loculated hydropneumothorax.  See below for further details.  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  Multifocal pneumonia.  Antimicrobial therapy: Vancomycin: 12/6>> Cefepime: 12/6>> Zithromax: 12/6>> 12/8  Microbiology data: 12/7>> blood cultures: No growth 12/7>> right pleural fluid culture: Pending  Procedures : 12/7>> CT-guided chest tube placement x2 by interventional radiology  Consults: CT surgery  DVT Prophylaxis : IV heparin   Subjective: Feels better-lying comfortably in bed without any major issues.   Assessment/Plan: Right-sided necrotizing pneumonia with loculated pleural effusion-probable empyema: Symptomatically improved post CT-guided chest tube placement-cardiothoracic surgery following.  Continue vancomycin/cefepime-await further culture data.  Stop Zithromax.  This could be a sequelae of recent severe COVID-19 infection.  History of recent right MCA CVA with left-sided weakness: Thought to be provoked due  to COVID-19 hypercoagulable state-minimal left-sided weakness persists.  Resume PT/OT eval while inpatient-maintain on IV heparin.  We will plan to resume oral anticoagulation when thought not to require any further surgical procedures.   Recent history of bilateral lower extremity DVT: Thought to be provoked due to COVID-19 hypercoagulable state-currently on IV heparin with plans to resume oral anticoagulation when she does not require any further surgical procedures.  HTN: BP controlled-continue metoprolol  Recent COVID-19 infection with severe hypoxemia: Does not require any further isolation as > 3 weeks out from her initial presentation.   Diet: Diet Order            Diet Heart Room service appropriate? Yes; Fluid consistency: Thin  Diet effective now                  Code Status: Full code   Family Communication: Called daughter [phone]) unable to leave voicemail-as mailbox full-on 12/8  Disposition Plan: Status is: Inpatient  Remains inpatient appropriate because:Inpatient level of care appropriate due to severity of illness   Dispo: The patient is from: Home              Anticipated d/c is to: TBD              Anticipated d/c date is: > 3 days              Patient currently is not medically stable to d/c.   Barriers to Discharge: Necrotizing pneumonia with complicated pleural effusion-chest tube in place-on IV antibiotics.  Antimicrobial agents: Anti-infectives (From admission, onward)   Start     Dose/Rate Route Frequency Ordered Stop   05/20/20 2200  vancomycin (VANCOCIN) IVPB 1000 mg/200 mL premix        1,000 mg 200 mL/hr over 60 Minutes Intravenous Every 12 hours 05/20/20 1401  05/20/20 1000  ceFEPIme (MAXIPIME) 2 g in sodium chloride 0.9 % 100 mL IVPB        2 g 200 mL/hr over 30 Minutes Intravenous Every 8 hours 05/20/20 0226 05/25/20 0959   05/20/20 1000  vancomycin (VANCOREADY) IVPB 500 mg/100 mL  Status:  Discontinued        500  mg 100 mL/hr over 60 Minutes Intravenous Every 12 hours 05/20/20 0240 05/20/20 1401   05/20/20 0230  azithromycin (ZITHROMAX) 500 mg in sodium chloride 0.9 % 250 mL IVPB        500 mg 250 mL/hr over 60 Minutes Intravenous Daily at bedtime 05/20/20 0226 05/24/20 2159   05/20/20 0100  vancomycin (VANCOCIN) IVPB 1000 mg/200 mL premix        1,000 mg 200 mL/hr over 60 Minutes Intravenous  Once 05/20/20 0046 05/20/20 0402   05/20/20 0045  ceFEPIme (MAXIPIME) 2 g in sodium chloride 0.9 % 100 mL IVPB        2 g 200 mL/hr over 30 Minutes Intravenous  Once 05/20/20 0030 05/20/20 0221       Time spent: 25-minutes-Greater than 50% of this time was spent in counseling, explanation of diagnosis, planning of further management, and coordination of care.  MEDICATIONS: Scheduled Meds: . amLODipine  5 mg Oral Daily  . atorvastatin  40 mg Oral Daily  . ipratropium-albuterol  3 mL Nebulization QID  . mouth rinse  15 mL Mouth Rinse BID  . metoprolol tartrate  50 mg Oral BID  . mometasone-formoterol  2 puff Inhalation BID  . sodium chloride flush  10-40 mL Intracatheter Q12H   Continuous Infusions: . azithromycin Stopped (05/20/20 2259)  . ceFEPime (MAXIPIME) IV 2 g (05/21/20 0937)  . heparin 900 Units/hr (05/20/20 2027)  . vancomycin 1,000 mg (05/21/20 1118)   PRN Meds:.acetaminophen **OR** acetaminophen, oxyCODONE-acetaminophen, sodium chloride flush   PHYSICAL EXAM: Vital signs: Vitals:   05/21/20 0813 05/21/20 0856 05/21/20 0934 05/21/20 1237  BP: 128/66     Pulse: 60 73 82   Resp: 16 16    Temp: 97.6 F (36.4 C)     TempSrc: Oral     SpO2: 99% 100%  100%  Weight:      Height:       Filed Weights   05/20/20 1330 05/21/20 0028  Weight: 98 kg 81.5 kg   Body mass index is 29.9 kg/m.   Gen Exam:Alert awake-not in any distress HEENT:atraumatic, normocephalic Chest: B/L clear to auscultation anteriorly CVS:S1S2 regular Abdomen:soft non tender, non distended Extremities:no  edema Neurology: Non focal Skin: no rash  I have personally reviewed following labs and imaging studies  LABORATORY DATA: CBC: Recent Labs  Lab 05/19/20 2129  WBC 18.5*  NEUTROABS 14.3*  HGB 12.3  HCT 40.2  MCV 101.3*  PLT 681*    Basic Metabolic Panel: Recent Labs  Lab 05/19/20 2129  NA 143  K 3.7  CL 104  CO2 27  GLUCOSE 99  BUN 13  CREATININE 0.86  CALCIUM 9.2    GFR: Estimated Creatinine Clearance: 56.9 mL/min (by C-G formula based on SCr of 0.86 mg/dL).  Liver Function Tests: Recent Labs  Lab 05/19/20 2129  AST 26  ALT 29  ALKPHOS 82  BILITOT 0.7  PROT 6.7  ALBUMIN 2.5*   No results for input(s): LIPASE, AMYLASE in the last 168 hours. No results for input(s): AMMONIA in the last 168 hours.  Coagulation Profile: No results for input(s): INR, PROTIME in the last 168  hours.  Cardiac Enzymes: No results for input(s): CKTOTAL, CKMB, CKMBINDEX, TROPONINI in the last 168 hours.  BNP (last 3 results) No results for input(s): PROBNP in the last 8760 hours.  Lipid Profile: No results for input(s): CHOL, HDL, LDLCALC, TRIG, CHOLHDL, LDLDIRECT in the last 72 hours.  Thyroid Function Tests: No results for input(s): TSH, T4TOTAL, FREET4, T3FREE, THYROIDAB in the last 72 hours.  Anemia Panel: No results for input(s): VITAMINB12, FOLATE, FERRITIN, TIBC, IRON, RETICCTPCT in the last 72 hours.  Urine analysis:    Component Value Date/Time   COLORURINE AMBER (A) 04/22/2020 1205   APPEARANCEUR HAZY (A) 04/22/2020 1205   LABSPEC 1.026 04/22/2020 1205   PHURINE 5.0 04/22/2020 1205   GLUCOSEU NEGATIVE 04/22/2020 1205   HGBUR NEGATIVE 04/22/2020 1205   BILIRUBINUR NEGATIVE 04/22/2020 1205   KETONESUR 5 (A) 04/22/2020 1205   PROTEINUR 30 (A) 04/22/2020 1205   UROBILINOGEN 0.2 02/03/2014 2029   NITRITE NEGATIVE 04/22/2020 1205   LEUKOCYTESUR NEGATIVE 04/22/2020 1205    Sepsis Labs: Lactic Acid, Venous    Component Value Date/Time   LATICACIDVEN 1.6  05/19/2020 2309    MICROBIOLOGY: Recent Results (from the past 240 hour(s))  Blood culture (routine x 2)     Status: None (Preliminary result)   Collection Time: 05/20/20  1:14 AM   Specimen: BLOOD  Result Value Ref Range Status   Specimen Description BLOOD SITE NOT SPECIFIED  Final   Special Requests   Final    BOTTLES DRAWN AEROBIC AND ANAEROBIC Blood Culture adequate volume   Culture   Final    NO GROWTH 1 DAY Performed at [facility], [address] [place], [place] [address]    Report Status PENDING  Incomplete  Blood culture (routine x 2)     Status: None (Preliminary result)   Collection Time: 05/20/20  3:55 AM   Specimen: BLOOD  Result Value Ref Range Status   Specimen Description BLOOD A-LINE  Final   Special Requests   Final    BOTTLES DRAWN AEROBIC AND ANAEROBIC Blood Culture adequate volume   Culture   Final    NO GROWTH 1 DAY Performed at [facility], [address] [place], [place] [address]    Report Status PENDING  Incomplete  Aerobic/Anaerobic Culture (surgical/deep wound)     Status: None (Preliminary result)   Collection Time: 05/20/20  8:57 PM   Specimen: Abscess  Result Value Ref Range Status   Specimen Description ABSCESS RIGHT PLEURAL  Final   Special Requests SYRINGE  Final   Gram Stain   Final    ABUNDANT WBC PRESENT, PREDOMINANTLY PMN FEW GRAM POSITIVE COCCI IN PAIRS IN CLUSTERS    Culture   Final    CULTURE REINCUBATED FOR BETTER GROWTH Performed at [facility], [address] [place], [place] [address]    Report Status PENDING  Incomplete    RADIOLOGY STUDIES/RESULTS: CT Angio Chest PE W and/or Wo Contrast  Result Date: 05/20/2020 CLINICAL DATA:  PE suspected. Recent COVID diagnosis with new hypoxemia and hemoptysis. EXAM: CT ANGIOGRAPHY CHEST WITH CONTRAST TECHNIQUE: Multidetector CT imaging of the chest was performed using the standard protocol during bolus administration of intravenous contrast. Multiplanar CT  image reconstructions and MIPs were obtained to evaluate the vascular anatomy. CONTRAST:  75 mL of Omnipaque 350 COMPARISON:  None. FINDINGS: Cardiovascular: Contrast injection is sufficient to demonstrate satisfactory opacification of the pulmonary arteries to the segmental level. There is no pulmonary embolus or  evidence of right heart strain. The size of the main pulmonary artery is normal. Mild cardiomegaly. The course and caliber of the aorta are normal. There is mild atherosclerotic calcification. Opacification decreased due to pulmonary arterial phase contrast bolus timing. Mediastinum/Nodes: -- No mediastinal lymphadenopathy. -- No hilar lymphadenopathy. -- No axillary lymphadenopathy. -- No supraclavicular lymphadenopathy. -- Normal thyroid gland where visualized. -  Unremarkable esophagus. Lungs/Pleura: There is a large, loculated, right-sided hydropneumothorax. There is complete collapse of the right lower lobe. There are hypoattenuating areas throughout the collapsed right lower lobe consistent with a necrotizing pneumonia. There are findings suspicious for a bronchopleural fistula arising from the right lower lobe (axial series 8 image 96). There is complete collapse of the right middle lobe. There is near complete collapse of the right upper lobe. Extensive consolidation and atelectasis is noted throughout the right middle and right upper lobes. There is shift of the mediastinum to the left. Hazy, coarse airspace opacities are noted involving the left lung field without evidence for left-sided pneumothorax or significant pleural effusion. Upper Abdomen: Contrast bolus timing is not optimized for evaluation of the abdominal organs. The visualized portions of the organs of the upper abdomen are normal. Musculoskeletal: No chest wall abnormality. No bony spinal canal stenosis. Review of the MIP images confirms the above findings. IMPRESSION: 1. No acute pulmonary embolism. 2. Large right-sided, loculated,  hydropneumothorax. 3. 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 4. Complete collapse of the right middle lobe and partial collapse of the right upper lobe. There are nonenhancing components within the right middle lobe and right upper lobe concerning for multifocal pneumonia. 5. Scattered coarse airspace opacities are noted throughout the left lung field consistent with the patient's history of recent COVID-19 pneumonia. Aortic Atherosclerosis (ICD10-I70.0). Electronically Signed   By: Katherine Mantle M.D.   On: 05/20/2020 00:22   DG Chest Port 1 View  Result Date: 05/21/2020 CLINICAL DATA:  Chest tube in place. EXAM: PORTABLE CHEST 1 VIEW COMPARISON:  May 19, 2020. FINDINGS: Stable cardiomediastinal silhouette. Interval placement of 2 right-sided chest tubes. Right pneumothorax is significantly smaller compared to prior exam. Some degree of pleural effusion is also noted. Mild subcutaneous emphysema is seen over right lateral chest wall. Bilateral lung opacities are noted which may represent multifocal pneumonia. Bony thorax is unremarkable. IMPRESSION: Interval placement of 2 right-sided chest tubes. Right pneumothorax is significantly smaller compared to prior exam. Some degree of pleural effusion is also noted. Bilateral lung opacities are noted which may represent multifocal pneumonia. Electronically Signed   By: Lupita Raider M.D.   On: 05/21/2020 08:41   DG Chest Portable 1 View  Result Date: 05/19/2020 CLINICAL DATA:  Sudden onset short of breath with hemoptysis EXAM: PORTABLE CHEST 1 VIEW COMPARISON:  04/21/2020 FINDINGS: Interval hyper lucent right thorax with meniscus levels at the right base concerning for moderate to large hydropneumothorax. Patchy consolidation and ground-glass density in the left lower lung without significant change. Stable cardiomediastinal silhouette with  aortic atherosclerosis. IMPRESSION: 1. Increased hyperlucency of the right thorax with meniscus levels at the right base concerning for moderate to large hydropneumothorax. CT chest recommended for further evaluation. 2. Stable patchy consolidation and ground-glass density in the left lower lung. Critical Value/emergent results were called by telephone at the time of interpretation on 05/19/2020 at 7:39 pm to provider [facility] , who verbally acknowledged these results. Electronically Signed   By: Selena Batten  Jake Samples M.D.   On: 05/19/2020 19:39   CT IMAGE GUIDED DRAINAGE BY PERCUTANEOUS CATHETER  Result Date: 05/21/2020 INDICATION: 79 year old female with history of recent COVID pneumonia presenting with large right hydropneumothorax. EXAM: CT IMAGE GUIDED DRAINAGE BY PERCUTANEOUS CATHETER COMPARISON:  Chest CT from 03/03/2020 MEDICATIONS: The patient is currently admitted to the hospital and receiving intravenous antibiotics. The antibiotics were administered within an appropriate time frame prior to the initiation of the procedure. ANESTHESIA/SEDATION: Moderate (conscious) sedation was employed during this procedure. A total of Versed 3 mg and Fentanyl 150 mcg was administered intravenously. Moderate Sedation Time: 37 minutes. The patient's level of consciousness and vital signs were monitored continuously by radiology nursing throughout the procedure under my direct supervision. CONTRAST:  None COMPLICATIONS: None immediate. PROCEDURE: Informed written consent was obtained from the patient after a discussion of the risks, benefits and alternatives to treatment. The patient was placed supine on the CT gantry and a pre procedural CT was performed re-demonstrating the large right hydropneumothorax. The procedure was planned. A timeout was performed prior to the initiation of the procedure. The anterior, right superior thorax was prepped and draped in the usual sterile fashion. The overlying soft tissues were  anesthetized with 1% lidocaine with epinephrine. An 18 gauge trocar needle was advanced into the pleural space and a short Amplatz super stiff wire was coiled within the collection. Appropriate positioning was confirmed with a limited CT scan. The tract was serially dilated allowing placement of a 14 French all-purpose drainage catheter. Appropriate positioning was confirmed with a limited postprocedural CT scan. A total of approximately 100 ml of thick, purulence and sanguinous fluid was aspirated. Samples were sent to the lab for fluid analysis. The tube was connected to a pleurovac and sutured in place. A dressing was placed. Next, the patient was placed in a partial left lateral decubitus position and an additional limited range CT of the chest was performed demonstrated persistent fluid collection in the basal pleura. Additional thoracostomy tube placement was planned. The right mid axillary line was prepped and draped in the usual sterile fashion. The overlying soft tissues were anesthetized with 1% lidocaine with epinephrine. An 18 gauge trocar needle was advanced into the pleural space and a short Amplatz super stiff wire was coiled within the collection. Appropriate positioning was confirmed with a limited CT scan. The tract was serially dilated allowing placement of a 14 French all-purpose drainage catheter. Appropriate positioning was confirmed with a limited postprocedural CT scan. The patient tolerated the procedure well without immediate post procedural complication. IMPRESSION: Successful CT guided placement of a 79 French all purpose drain catheter into the right lung apex as well as additional 14 [place] all-purpose drain catheter into the posterolateral and inferior right pleural space with aspiration of thick, purulence thin sanguinous fluid. Samples were sent to the laboratory as requested by the ordering clinical team. Marliss Coots, MD [facility]  Radiology Electronically Signed   By: Marliss Coots MD   On: 05/21/2020 08:12     LOS: 1 day   Jeoffrey Massed, MD  [facility]    To contact the attending provider between 7A-7P or the covering provider during after hours 7P-7A, please log into the web site [email or web address] and access using universal [place] password for that web site. If you do not have the password, please call the hospital operator.  05/21/2020, 2:27 PM

## 2020-05-21 NOTE — Progress Notes (Signed)
ANTICOAGULATION CONSULT NOTE   Pharmacy Consult for Heparin  Indication: Recent DVT, Recent CVA   No Known Allergies  Vital Signs: Temp: 97.6 F (36.4 C) (12/08 0813) Temp Source: Oral (12/08 0813) BP: 128/66 (12/08 0813) Pulse Rate: 82 (12/08 0934)   Weight: 98 kg Height:  65 inches Heparin Dosing Weight: 79.3 kg  Labs: Recent Labs    05/19/20 2129 05/19/20 2309 05/20/20 1237  HGB 12.3  --   --   HCT 40.2  --   --   PLT 681*  --   --   APTT  --   --  39*  CREATININE 0.86  --   --   TROPONINIHS 12 12  --     Assessment: 79 yr old female with shortness of breath, recent COVID, recent DVT on apixaban PTA; apixaban on hold for IR procedure and pharmacy was consulted to dose IV heparin. Of note, patient required placement of two chest tubes on 12/7 and heparin was resumed -lab collected was attempted x3 per phlebotomy. No further attempts until 12/9  Goal of Therapy:  Heparin level 0.3-0.5 units/ml, recent CVA APTT 66-84 secs Monitor platelets by anticoagulation protocol: Yes   Plan:  -Continue heparin at 900 units/hr  -aPTT, heparin level and CBC in am  Harland German, PharmD Clinical Pharmacist **Pharmacist phone directory can now be found on [email or web address] ([email or web address]).  Listed under [facility].

## 2020-05-21 NOTE — Progress Notes (Addendum)
      [address]       [name] [address]             [phone]           Subjective: Patient states breathing is better than yesterday  Objective: Vital signs in last 24 hours: Temp:  [97.6 F (36.4 C)-97.9 F (36.6 C)] 97.8 F (36.6 C) (12/08 0431) Pulse Rate:  [43-83] 61 (12/08 0431) Cardiac Rhythm: Normal sinus rhythm (12/08 0729) Resp:  [14-27] 17 (12/08 0431) BP: (115-159)/(62-102) 150/82 (12/08 0431) SpO2:  [94 %-100 %] 97 % (12/08 0431) Weight:  [81.5 kg-98 kg] 81.5 kg (12/08 0028)      Intake/Output from previous day: 12/07 0701 - 12/08 0700 In: 200 [IV Piggyback:200] Out: 1230 [Urine:700; Chest Tube:530]   Physical Exam:  Cardiovascular: RRR Pulmonary: Clear on the left and slightly diminished right lateral breath sounds Wounds: Dressing is clean and dry.   Chest Tubes:to suction. Pigtail #2 has air leak.  Lab Results: JSH:[id number] Labs    05/19/20 2129  WBC 18.5*  HGB 12.3  HCT 40.2  PLT 681*   BMET:  Recent Labs    05/19/20 2129  NA 143  K 3.7  CL 104  CO2 27  GLUCOSE 99  BUN 13  CREATININE 0.86  CALCIUM 9.2    PT/INR: No results for input(s): LABPROT, INR in the last 72 hours. ABG:  INR: Will add last result for INR, ABG once components are confirmed Will add last 4 CBG results once components are confirmed  Assessment/Plan:  1. CV - SR with HR in the 70's 2.  Pulmonary - Chest tubes with 530 cc (total for both). Chest tubes to suction, no air leak from pigtail #1 but an air leak in pigtail #2. CXR this am appears to show right lateral pneumothorax. Chest tubes to remain for now. Check CXR in am.   Beverly Howell [facility] 05/21/2020,8:12 AM [phone]  Patient is not candidate for major surgical procedures - manage with chest tubes   Chest tube #2 was kinked , when fixed has small air leak from one tube  I have seen and examined Beverly Howell and agree with the above assessment  and plan.  [name] MD [facility] [phone] Office [phone] 05/21/2020 9:04 AM

## 2020-05-22 ENCOUNTER — Inpatient Hospital Stay (HOSPITAL_COMMUNITY): Payer: Medicare (Managed Care)

## 2020-05-22 LAB — CBC
HCT: 35.5 % — ABNORMAL LOW (ref 36.0–46.0)
Hemoglobin: 10.9 g/dL — ABNORMAL LOW (ref 12.0–15.0)
MCH: 30.4 pg (ref 26.0–34.0)
MCHC: 30.7 g/dL (ref 30.0–36.0)
MCV: 99.2 fL (ref 80.0–100.0)
Platelets: 262 10*3/uL (ref 150–400)
RBC: 3.58 MIL/uL — ABNORMAL LOW (ref 3.87–5.11)
RDW: 16.3 % — ABNORMAL HIGH (ref 11.5–15.5)
WBC: 8.6 10*3/uL (ref 4.0–10.5)
nRBC: 0 % (ref 0.0–0.2)

## 2020-05-22 LAB — APTT
aPTT: 37 seconds — ABNORMAL HIGH (ref 24–36)
aPTT: 47 seconds — ABNORMAL HIGH (ref 24–36)
aPTT: 77 seconds — ABNORMAL HIGH (ref 24–36)

## 2020-05-22 LAB — COMPREHENSIVE METABOLIC PANEL
ALT: 17 U/L (ref 0–44)
AST: 16 U/L (ref 15–41)
Albumin: 1.9 g/dL — ABNORMAL LOW (ref 3.5–5.0)
Alkaline Phosphatase: 59 U/L (ref 38–126)
Anion gap: 9 (ref 5–15)
BUN: 6 mg/dL — ABNORMAL LOW (ref 8–23)
CO2: 26 mmol/L (ref 22–32)
Calcium: 8.2 mg/dL — ABNORMAL LOW (ref 8.9–10.3)
Chloride: 105 mmol/L (ref 98–111)
Creatinine, Ser: 0.8 mg/dL (ref 0.44–1.00)
GFR, Estimated: >60 mL/min (ref >60)
Glucose, Bld: 90 mg/dL (ref 70–99)
Potassium: 3.5 mmol/L (ref 3.5–5.1)
Sodium: 140 mmol/L (ref 135–145)
Total Bilirubin: 0.7 mg/dL (ref 0.3–1.2)
Total Protein: 5.6 g/dL — ABNORMAL LOW (ref 6.5–8.1)

## 2020-05-22 LAB — VANCOMYCIN, TROUGH: Vancomycin Tr: 21 ug/mL (ref 15–20)

## 2020-05-22 LAB — HEPARIN LEVEL (UNFRACTIONATED): Heparin Unfractionated: 0.76 IU/mL — ABNORMAL HIGH (ref 0.30–0.70)

## 2020-05-22 MED ORDER — VANCOMYCIN HCL 750 MG/150ML IV SOLN: 750.0000 mg | Freq: Two times a day (BID) | INTRAVENOUS | Status: DC

## 2020-05-22 MED ORDER — INFLUENZA VAC A&B SA ADJ QUAD 0.5 ML IM PRSY
0.5000 mL | PREFILLED_SYRINGE | INTRAMUSCULAR | Status: DC
  Filled 2020-05-22: qty 0.5

## 2020-05-22 MED ORDER — VANCOMYCIN HCL 750 MG/150ML IV SOLN
750.0000 mg | Freq: Two times a day (BID) | INTRAVENOUS | Status: DC
  Administered 2020-05-23: 750 mg via INTRAVENOUS
  Filled 2020-05-22 (×2): qty 150

## 2020-05-22 MED ORDER — PNEUMOCOCCAL VAC POLYVALENT 25 MCG/0.5ML IJ INJ
0.5000 mL | INJECTION | INTRAMUSCULAR | Status: AC
  Administered 2020-05-27: 09:00:00 0.5 mL via INTRAMUSCULAR
  Filled 2020-05-22: qty 0.5

## 2020-05-22 NOTE — Progress Notes (Signed)
05/22/20 1615  PT Evaluation Information  Last PT Received On 05/22/20  Assistance Needed +1  History of Present Illness Pt is a 79 y/o female admitted from SN[facility] secondary to SOB and cough with hemoptysis. X-rays showed right-sided hydropneumothorax and a CT angiogram was done which shows hydropneumothorax and necrotizing pneumonia on the right side and on the left side with features concerning for previous pneumonia from Covid. Pt is s/p chest tube placement. Pt with recent admission for CVA and COVID. PMH includes HTN and DVT.  Precautions  Precautions Fall  Precaution Comments Chest tube  Restrictions  Weight Bearing Restrictions No  Home Living  Family/patient expects to be discharged to: Skilled nursing facility  Additional Comments Per pt, she was at home, per notes, she was at [facility].  Prior Function  Level of Independence Needs assistance  Gait / Transfers Assistance Needed Per notes, pt working with PT on ambulating with RW  ADL's / Homemaking Assistance Needed Pt requiring assist from staff.  Communication  Communication HOH  Pain Assessment  Pain Assessment 0-10  Pain Score 8  Pain Location Chest tube site  Pain Descriptors / Indicators Sharp;Moaning;Grimacing  Pain Intervention(s) Limited activity within patient's tolerance;Monitored during session;Repositioned  Cognition  Arousal/Alertness Awake/alert  Behavior During Therapy WFL for tasks assessed/performed  Overall Cognitive Status Impaired/Different from baseline  Area of Impairment Following commands;Safety/judgement;Problem solving;Awareness;Memory  Memory Decreased short-term memory  Following Commands Follows one step commands consistently;Follows multi-step commands with increased time  Safety/Judgement Decreased awareness of deficits  Awareness Emergent  Problem Solving Slow processing;Requires verbal cues  General Comments Slow processing. reports she was at home prior to hospitalization, however, was actually  at [facility]. Conflicting information noted throughout.  Upper Extremity Assessment  Upper Extremity Assessment Defer to OT evaluation  Lower Extremity Assessment  Lower Extremity Assessment Generalized weakness;LLE deficits/detail  LLE Deficits / Details hx of L sided weakness from CVA.  Cervical / Trunk Assessment  Cervical / Trunk Assessment Other exceptions  Cervical / Trunk Exceptions chest tube  Bed Mobility  Overal bed mobility Needs Assistance  Bed Mobility Rolling  Rolling Min guard  General bed mobility comments Pt with increased pain at chest tube side, so refusing OOB mobility. Was agreeable to clean up following BM. Min guard for safety and line management to roll from side to side for clean up. Pt with increased pain and grimacing throughout. Cues for pursed lip breathing.  Transfers  General transfer comment pt refusing secondary to pain.  PT - End of Session  Activity Tolerance Patient limited by pain  Patient left in bed;with call bell/phone within reach;with nursing/sitter in room  Nurse Communication Mobility status  PT Assessment  PT Recommendation/Assessment Patient needs continued PT services  PT Visit Diagnosis Muscle weakness (generalized) (M62.81);Unsteadiness on feet (R26.81)  PT Problem List Decreased strength;Decreased balance;Decreased activity tolerance;Decreased mobility;Decreased cognition;Decreased knowledge of use of DME;Decreased safety awareness;Decreased knowledge of precautions;Pain;Cardiopulmonary status limiting activity  PT Plan  PT Frequency (ACUTE ONLY) Min 2X/week  PT Treatment/Interventions (ACUTE ONLY) DME instruction;Therapeutic activities;Gait training;Patient/family education;Therapeutic exercise;Stair training;Balance training;Functional mobility training;Neuromuscular re-education;Cognitive remediation  AM-PAC PT "6 Clicks" Mobility Outcome Measure (Version 2)  Help needed turning from your back to your side while in a flat bed without using  bedrails? 3  Help needed moving from lying on your back to sitting on the side of a flat bed without using bedrails? 3  Help needed moving to and from a bed to a chair (including a wheelchair)? 2  Help  needed standing up from a chair using your arms (e.g., wheelchair or bedside chair)? 2  Help needed to walk in hospital room? 2  Help needed climbing 3-5 steps with a railing?  1  6 Click Score 13  Consider Recommendation of Discharge To: CIR/SNF/LTACH  PT Recommendation  Follow Up Recommendations SNF;Supervision/Assistance - 24 hour  PT equipment Wheelchair (measurements PT);Wheelchair cushion (measurements PT)  Individuals Consulted  Consulted and Agree with Results and Recommendations Patient  Acute Rehab PT Goals  Patient Stated Goal get stronger  PT Goal Formulation With patient  Time For Goal Achievement 06/05/20  Potential to Achieve Goals Fair  PT Time Calculation  PT Start Time (ACUTE ONLY) 1514  PT Stop Time (ACUTE ONLY) 1529  PT Time Calculation (min) (ACUTE ONLY) 15 min  PT General Charges  $$ ACUTE PT VISIT 1 Visit  PT Evaluation  $PT Eval Moderate Complexity 1 Mod   Pt admitted secondary to problem above with deficits above. Pt with poor tolerance for mobility secondary to pain and chest tube site. Was only agreeable for rolling for clean up following BM this session. Pt also with cognitive deficits, as she reported conflicting information throughout. Per notes, pt from SNF, however, pt reports she was at home prior to admission. Recommend return to SNF at d/c for continued rehab. Will continue to follow acutely.   Farley Ly, PT, DPT  Acute Rehabilitation Services  Pager: [phone] Office: [phone]

## 2020-05-22 NOTE — Progress Notes (Signed)
Referring Physician(s): Myer Peer)  Supervising Physician: Oley Balm  Patient Status:  [facility] - In-pt  Chief Complaint: None  Subjective:  History of recent COVID-19 pneumonia complicated by development of right hydropneumothorax s/p right chest tube placement x2 in IR 05/20/2020. Patient awake and alert laying in bed undergoing a breathing treatment. No complaints. Right chest tube sites c/d/i. Sating 94% with breathing treatment.  CXR this AM: 1. Two right chest tubes in stable position. Stable right pneumothorax and small right pleural effusion. Stable right chest wall subcutaneous emphysema. 2. Persistent bibasilar atelectasis/infiltrates.   Allergies: Patient has no known allergies.  Medications: Prior to Admission medications   Medication Sig Start Date End Date Taking? Authorizing Provider  acetaminophen (TYLENOL) 500 MG tablet Take 1,000 mg by mouth in the morning, at noon, and at bedtime.   Yes [provider]  albuterol (VENTOLIN HFA) 108 (90 Base) MCG/ACT inhaler Inhale 2 puffs into the lungs every 8 (eight) hours as needed for wheezing.  05/01/20  Yes [provider]  amLODipine (NORVASC) 5 MG tablet Take 1 tablet (5 mg total) by mouth daily. 04/29/20  Yes Ghimire, Werner Lean, MD  apixaban (ELIQUIS) 5 MG TABS tablet Take 1 tablet (5 mg total) by mouth 2 (two) times daily. Start 11/17 evening. 04/30/20  Yes Ghimire, Werner Lean, MD  atenolol (TENORMIN) 50 MG tablet Take 50 mg by mouth at bedtime. 05/13/20  Yes [provider]  atorvastatin (LIPITOR) 40 MG tablet Take 1 tablet (40 mg total) by mouth daily. 04/29/20  Yes Ghimire, Werner Lean, MD  Calcium Carb-Cholecalciferol (CALCIUM 600-D PO) Take 1 tablet by mouth in the morning and at bedtime.    Yes [provider]  cholecalciferol (VITAMIN D) 25 MCG (1000 UNIT) tablet Take 1,000 Units by mouth daily.    Yes [provider]  dexamethasone (DECADRON) 6 MG  tablet Take 6 mg by mouth at bedtime. 05/12/20  Yes [provider]  furosemide (LASIX) 10 MG/ML injection Inject 10 mg into the muscle daily. 05/13/20  Yes [provider]  guaiFENesin (MUCINEX) 600 MG 12 hr tablet Take 1,200 mg by mouth 2 (two) times daily.   Yes [provider]  ipratropium-albuterol (DUONEB) 0.5-2.5 (3) MG/3ML SOLN Take 3 mLs by nebulization 2 (two) times daily as needed. 05/12/20  Yes [provider]  lidocaine (LMX) 4 % cream Apply 1 application topically 2 (two) times daily.   Yes [provider]  Omega-3 Fatty Acids (FISH OIL TRIPLE STRENGTH) 1400 MG CAPS Take 1,400 mg by mouth daily.   Yes [provider]  pantoprazole (PROTONIX) 40 MG tablet Take 1 tablet (40 mg total) by mouth daily. 04/29/20  Yes Ghimire, Werner Lean, MD  polyethylene glycol (MIRALAX / GLYCOLAX) 17 g packet Take 17 g by mouth every other day.   Yes [provider]  sennosides-docusate sodium (SENOKOT-S) 8.6-50 MG tablet Take 2 tablets by mouth daily.   Yes [provider]  simethicone (MYLICON) 125 MG chewable tablet Chew 125 mg by mouth in the morning and at bedtime.   Yes [provider]  apixaban (ELIQUIS) 5 MG TABS tablet Take 2 tablets (10 mg total) by mouth 2 (two) times daily for 4 doses. Patient not taking: Reported on 05/20/2020 04/28/20 05/20/20  Maretta Bees, MD  metoprolol tartrate (LOPRESSOR) 25 MG tablet Take 2 tablets (50 mg total) by mouth 2 (two) times daily. Patient not taking: Reported on 05/20/2020 04/28/20   Jeoffrey Massed  M, MD     Vital Signs: BP (!) 113/99 (BP Location: Right Arm)   Pulse 88   Temp 97.8 F (36.6 C) (Oral)   Resp 20   Ht 5\' 5"  (1.651 m)   Wt 179 lb 10.8 oz (81.5 kg)   SpO2 100%   BMI 29.90 kg/m   Physical Exam Vitals and nursing note reviewed.  Constitutional:      General: She is not in acute distress. Pulmonary:     Effort: Pulmonary effort is normal. No  respiratory distress.     Comments: Undergoing breathing treatment. Sating 94%. Right chest tube sites without erythema, drainage, or active bleeding. Right chest tube #1 with approximately 1010 cc bloody fluid in pleure-vac; tube to suction with (+) air leak. Right chest tube #2 with approximately 360 cc bloody fluid in pleure-vac; tube to suction with (-) air leak. Skin:    General: Skin is warm and dry.  Neurological:     Mental Status: She is alert and oriented to person, place, and time.     Imaging: CT Angio Chest PE W and/or Wo Contrast  Result Date: 05/20/2020 CLINICAL DATA:  PE suspected. Recent COVID diagnosis with new hypoxemia and hemoptysis. EXAM: CT ANGIOGRAPHY CHEST WITH CONTRAST TECHNIQUE: Multidetector CT imaging of the chest was performed using the standard protocol during bolus administration of intravenous contrast. Multiplanar CT image reconstructions and MIPs were obtained to evaluate the vascular anatomy. CONTRAST:  75 mL of Omnipaque 350 COMPARISON:  None. FINDINGS: Cardiovascular: Contrast injection is sufficient to demonstrate satisfactory opacification of the pulmonary arteries to the segmental level. There is no pulmonary embolus or evidence of right heart strain. The size of the main pulmonary artery is normal. Mild cardiomegaly. The course and caliber of the aorta are normal. There is mild atherosclerotic calcification. Opacification decreased due to pulmonary arterial phase contrast bolus timing. Mediastinum/Nodes: -- No mediastinal lymphadenopathy. -- No hilar lymphadenopathy. -- No axillary lymphadenopathy. -- No supraclavicular lymphadenopathy. -- Normal thyroid gland where visualized. -  Unremarkable esophagus. Lungs/Pleura: There is a large, loculated, right-sided hydropneumothorax. There is complete collapse of the right lower lobe. There are hypoattenuating areas throughout the collapsed right lower lobe consistent with a necrotizing pneumonia. 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 There is complete collapse of the right middle lobe. There is near complete collapse of the right upper lobe. Extensive consolidation and atelectasis is noted throughout the right middle and right upper lobes. There is shift of the mediastinum to the left. Hazy, coarse airspace opacities are noted involving the left lung field without evidence for left-sided pneumothorax or significant pleural effusion. Upper Abdomen: Contrast bolus timing is not optimized for evaluation of the abdominal organs. The visualized portions of the organs of the upper abdomen are normal. Musculoskeletal: No chest wall abnormality. No bony spinal canal stenosis. Review of the MIP images confirms the above findings. IMPRESSION: 1. No acute pulmonary embolism. 2. Large right-sided, loculated, hydropneumothorax. 3. 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 4. Complete collapse of the right middle lobe and partial collapse of the right upper lobe. There are nonenhancing components within the right middle lobe and right upper lobe concerning for multifocal pneumonia. 5. Scattered coarse airspace opacities are noted throughout the left lung field consistent with the patient's history of recent COVID-19 pneumonia. Aortic Atherosclerosis (ICD10-I70.0). Electronically Signed  By: Katherine Mantle M.D.   On: 05/20/2020 00:22   DG Chest Port 1 View  Result Date: 05/22/2020 CLINICAL DATA:  Chest tube. EXAM: PORTABLE CHEST 1 VIEW COMPARISON:  05/21/2020. FINDINGS: Two right chest tubes in stable position. Stable right pneumothorax and small right pleural effusion. Persistent bibasilar atelectasis/infiltrates. Heart size normal. Degenerative change thoracic spine. Stable right chest wall subcutaneous emphysema. IMPRESSION: 1. Two right  chest tubes in stable position. Stable right pneumothorax and small right pleural effusion. Stable right chest wall subcutaneous emphysema. 2. Persistent bibasilar atelectasis/infiltrates. Electronically Signed   By: Maisie Fus  Register   On: 05/22/2020 06:16   DG Chest Port 1 View  Result Date: 05/21/2020 CLINICAL DATA:  Chest tube in place. EXAM: PORTABLE CHEST 1 VIEW COMPARISON:  May 19, 2020. FINDINGS: Stable cardiomediastinal silhouette. Interval placement of 2 right-sided chest tubes. Right pneumothorax is significantly smaller compared to prior exam. Some degree of pleural effusion is also noted. Mild subcutaneous emphysema is seen over right lateral chest wall. Bilateral lung opacities are noted which may represent multifocal pneumonia. Bony thorax is unremarkable. IMPRESSION: Interval placement of 2 right-sided chest tubes. Right pneumothorax is significantly smaller compared to prior exam. Some degree of pleural effusion is also noted. Bilateral lung opacities are noted which may represent multifocal pneumonia. Electronically Signed   By: Lupita Raider M.D.   On: 05/21/2020 08:41   DG Chest Portable 1 View  Result Date: 05/19/2020 CLINICAL DATA:  Sudden onset short of breath with hemoptysis EXAM: PORTABLE CHEST 1 VIEW COMPARISON:  04/21/2020 FINDINGS: Interval hyper lucent right thorax with meniscus levels at the right base concerning for moderate to large hydropneumothorax. Patchy consolidation and ground-glass density in the left lower lung without significant change. Stable cardiomediastinal silhouette with aortic atherosclerosis. IMPRESSION: 1. Increased hyperlucency of the right thorax with meniscus levels at the right base concerning for moderate to large hydropneumothorax. CT chest recommended for further evaluation. 2. Stable patchy consolidation and ground-glass density in the left lower lung. Critical Value/emergent results were called by telephone at the time of interpretation on  05/19/2020 at 7:39 pm to provider [facility] , who verbally acknowledged these results. Electronically Signed   By: Jasmine Pang M.D.   On: 05/19/2020 19:39   CT IMAGE GUIDED DRAINAGE BY PERCUTANEOUS CATHETER  Result Date: 05/21/2020 INDICATION: 79 year old female with history of recent COVID pneumonia presenting with large right hydropneumothorax. EXAM: CT IMAGE GUIDED DRAINAGE BY PERCUTANEOUS CATHETER COMPARISON:  Chest CT from 03/03/2020 MEDICATIONS: The patient is currently admitted to the hospital and receiving intravenous antibiotics. The antibiotics were administered within an appropriate time frame prior to the initiation of the procedure. ANESTHESIA/SEDATION: Moderate (conscious) sedation was employed during this procedure. A total of Versed 3 mg and Fentanyl 150 mcg was administered intravenously. Moderate Sedation Time: 37 minutes. The patient's level of consciousness and vital signs were monitored continuously by radiology nursing throughout the procedure under my direct supervision. CONTRAST:  None COMPLICATIONS: None immediate. PROCEDURE: Informed written consent was obtained from the patient after a discussion of the risks, benefits and alternatives to treatment. The patient was placed supine on the CT gantry and a pre procedural CT was performed re-demonstrating the large right hydropneumothorax. The procedure was planned. A timeout was performed prior to the initiation of the procedure. The anterior, right superior thorax was prepped and draped in the usual sterile fashion. The overlying soft tissues were anesthetized with 1% lidocaine with epinephrine. An 18 gauge trocar needle was advanced into the pleural  space and a short Amplatz super stiff wire was coiled within the collection. Appropriate positioning was confirmed with a limited CT scan. The tract was serially dilated allowing placement of a 14 French all-purpose drainage catheter. Appropriate positioning was confirmed with a  limited postprocedural CT scan. A total of approximately 100 ml of thick, purulence and sanguinous fluid was aspirated. Samples were sent to the lab for fluid analysis. The tube was connected to a pleurovac and sutured in place. A dressing was placed. Next, the patient was placed in a partial left lateral decubitus position and an additional limited range CT of the chest was performed demonstrated persistent fluid collection in the basal pleura. Additional thoracostomy tube placement was planned. The right mid axillary line was prepped and draped in the usual sterile fashion. The overlying soft tissues were anesthetized with 1% lidocaine with epinephrine. An 18 gauge trocar needle was advanced into the pleural space and a short Amplatz super stiff wire was coiled within the collection. Appropriate positioning was confirmed with a limited CT scan. The tract was serially dilated allowing placement of a 14 French all-purpose drainage catheter. Appropriate positioning was confirmed with a limited postprocedural CT scan. The patient tolerated the procedure well without immediate post procedural complication. IMPRESSION: Successful CT guided placement of a 38 French all purpose drain catheter into the right lung apex as well as additional 14 [place] all-purpose drain catheter into the posterolateral and inferior right pleural space with aspiration of thick, purulence thin sanguinous fluid. Samples were sent to the laboratory as requested by the ordering clinical team. Marliss Coots, MD [facility] Electronically Signed   By: Marliss Coots MD   On: 05/21/2020 08:12    Labs:  CBC: Recent Labs    04/27/20 0713 04/28/20 0604 05/19/20 2129 05/22/20 0135  WBC 23.6* 22.6* 18.5* 8.6  HGB 12.5 12.8 12.3 10.9*  HCT 38.9 40.3 40.2 35.5*  PLT 302 299 681* 262    COAGS: Recent Labs    04/21/20 1950 05/20/20 1237 05/22/20 0135 05/22/20 0727  INR 1.5*  --    --   --   APTT 27 39* 37* 47*    BMP: Recent Labs    04/27/20 0400 04/28/20 0231 05/19/20 2129 05/22/20 0135  NA 138 136 143 140  K 5.1 5.4* 3.7 3.5  CL 104 104 104 105  CO2 21* 18* 27 26  GLUCOSE 145* 155* 99 90  BUN 36* 32* 13 6*  CALCIUM 8.4* 8.1* 9.2 8.2*  CREATININE 1.29* 1.13* 0.86 0.80  GFRNONAA 42* 50* >60 >60    LIVER FUNCTION TESTS: Recent Labs    04/26/20 0153 04/27/20 0400 05/19/20 2129 05/22/20 0135  BILITOT 1.1 1.2 0.7 0.7  AST 27 25 26 16   ALT 23 18 29 17   ALKPHOS 68 77 82 59  PROT 6.2* 5.4* 6.7 5.6*  ALBUMIN 2.7* 2.5* 2.5* 1.9*    Assessment and Plan:  History of recent COVID-19 pneumonia complicated by development of right hydropneumothorax s/p right chest tube placement x2 in IR 05/20/2020. Right chest tube #1 stable with approximately 1010 cc bloody fluid in pleure-vac; tube to suction with (+) air leak. Right chest tube #2 stable with approximately 360 cc bloody fluid in pleure-vac; tube to suction with (-) air leak. Continue current chest tube management- tubes to remain to suction at this time, monitor Qshift output and daily CXR; further tube management per TCTS. Further plans per TRH/TCTS- appreciate and agree with management. IR  will follow from a distance, please call IR with questions/concerns.   Electronically Signed: Elwin Mocha, PA-C 05/22/2020, 4:26 PM   I spent a total of 25 Minutes at the the patient's bedside AND on the patient's hospital floor or unit, greater than 50% of which was counseling/coordinating care for right hydropneumothorax s/p right chest tube placement x2.

## 2020-05-22 NOTE — Evaluation (Signed)
Occupational Therapy Evaluation [name] MRN: [id number] DOB: [birth year] Today's Date: 05/22/2020    History of Present Illness Beverly Howell is a 79 y.o. 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  At this point patient was transferred to the ER.  Patient states hemoptysis only slightly blood-tinged. In the ER x-rays showed right-sided hydropneumothorax and a CT angiogram was done which shows hydropneumothorax and necrotizing pneumonia on the right side and on the left side with features concerning for previous no pneumonia from Covid.  On-call cardiothoracic surgeon Dr. Lowella Fairy was consulted.   Clinical Impression   Pt presents with decline in function and safety with ADLs and ADL mobility with impaired strength, balance, endurance, cognitions and L UE function. Pt was at [facility] for ST rehab PTA and reports that she required assist with ADLs/selfcare and mobility. Pt with functional deficits listed below and would benefit for acute OT services to address impairments to maximize level of function and safety    Follow Up Recommendations  SNF;Supervision/Assistance - 24 hour    Equipment Recommendations  3 in 1 bedside commode;Wheelchair (measurements OT);Wheelchair cushion (measurements OT);Other (comment) (TBD at next venue of care)    Recommendations for Other Services PT consult     Precautions / Restrictions Precautions Precautions: Fall Precaution Comments: watch O2 and HR Restrictions Weight Bearing Restrictions: No      Mobility Bed Mobility Overal bed mobility: Needs  Assistance Bed Mobility: Supine to Sit;Sit to Supine     Supine to sit: Min assist Sit to supine: Min assist   General bed mobility comments: min A to elevate trunk and for LES back onto bed    Transfers Overall transfer level: Needs assistance Equipment used: Rolling walker (2 wheeled) Transfers: Sit to/from Stand Sit to Stand: Mod assist Stand pivot transfers: Min assist       General transfer comment: Sit - stand x 3 for EOB, SPT, side stepped to rail/HOB    Balance Overall balance assessment: Needs assistance Sitting-balance support: No upper extremity supported Sitting balance-Leahy Scale: Fair     Standing balance support: Bilateral upper extremity supported Standing balance-Leahy Scale: Poor                             ADL either performed or assessed with clinical judgement   ADL Overall ADL's : Needs assistance/impaired Eating/Feeding: Set up;Sitting   Grooming: Wash/dry hands;Wash/dry face;Min guard;Sitting   Upper Body Bathing: Moderate assistance;Sitting   Lower Body Bathing: Maximal assistance;Sitting/lateral leans;Sit to/from stand   Upper Body Dressing : Moderate assistance;Sitting   Lower Body Dressing: Maximal assistance;Sitting/lateral leans;Sit to/from stand   Toilet Transfer: Moderate assistance;Stand-pivot   Toileting- Clothing Manipulation and Hygiene: Maximal assistance       Functional mobility during ADLs: Moderate assistance       Vision Patient Visual Report: No change from baseline       Perception     Praxis      Pertinent Vitals/Pain Pain Assessment: No/denies pain     Hand Dominance Right   Extremity/Trunk Assessment Upper Extremity Assessment Upper Extremity Assessment: LUE deficits/detail;Generalized  weakness LUE Deficits / Details: noted generalized weakness and impaired proprioception with mild inattention extremity. LUE Coordination: decreased fine motor;decreased gross motor       Cervical /  Trunk Assessment Cervical / Trunk Assessment: Normal   Communication Communication Communication: No difficulties;HOH   Cognition Arousal/Alertness: Awake/alert Behavior During Therapy: WFL for tasks assessed/performed Overall Cognitive Status: Impaired/Different from baseline Area of Impairment: Following commands;Safety/judgement;Problem solving;Awareness;Orientation                       Following Commands: Follows one step commands consistently;Follows multi-step commands with increased time Safety/Judgement: Decreased awareness of deficits   Problem Solving: Requires verbal cues;Slow processing     General Comments       Exercises     Shoulder Instructions      Home Living Family/patient expects to be discharged to:: Private residence Living Arrangements: Children Available Help at Discharge: Family;Available PRN/intermittently;Other (Comment) (Pt at [facility] for ST rehab) Type of Home: Apartment Home Access: Stairs to enter Entrance Stairs-Number of Steps: flight (on second floor) Entrance Stairs-Rails: Right;Left Home Layout: One level     Bathroom Shower/Tub: Chief Strategy Officer: Standard     Home Equipment: Cane - single point   Additional Comments: no shower seat      Prior Functioning/Environment Level of Independence: Independent with assistive device(s)  Gait / Transfers Assistance Needed: Uses a cane in her R hand to walk; daughter assisted with stairs but reports she rarely goes out prior to stay at SNF, using RW at [facility] ADL's / Homemaking Assistance Needed: Reports could perform ADLs and light IADLs but reports she takes her time prior to stay at SNF   Comments: Pt was at [facility] for ST rehab PTA and plan is to return there to resume rehab        OT Problem List: Decreased strength;Impaired vision/perception;Decreased knowledge of use of DME or AE;Decreased range of motion;Decreased coordination;Decreased  cognition;Decreased activity tolerance;Impaired UE functional use;Cardiopulmonary status limiting activity;Impaired balance (sitting and/or standing);Decreased safety awareness;Pain      OT Treatment/Interventions: Self-care/ADL training;Patient/family education;Therapeutic exercise;Neuromuscular education;Balance training;Therapeutic activities;DME and/or AE instruction    OT Goals(Current goals can be found in the care plan section) Acute Rehab OT Goals Patient Stated Goal: get stronger OT Goal Formulation: With patient Time For Goal Achievement: 06/05/20 Potential to Achieve Goals: Good ADL Goals Pt Will Perform Grooming: with supervision;with set-up;sitting Pt Will Perform Upper Body Bathing: with min assist;sitting Pt Will Perform Upper Body Dressing: with min assist;sitting Pt Will Transfer to Toilet: with min assist;ambulating;bedside commode;grab bars;stand pivot transfer Pt Will Perform Toileting - Clothing Manipulation and hygiene: with min assist;sit to/from stand Additional ADL Goal #1: Pt will tolerate AA/AROM of L UE to improve ROM and function for ADLs and functional tasks  OT Frequency: Min 2X/week   Barriers to D/C:            Co-evaluation              AM-PAC OT "6 Clicks" Daily Activity     Outcome Measure Help from another person eating meals?: A Little Help from another person taking care of personal grooming?: A Little Help from another person toileting, which includes using toliet, bedpan, or urinal?: A Lot Help from another person bathing (including washing, rinsing, drying)?: A Lot Help from another person to put on and taking off regular upper body clothing?: A Little Help from another person to put on and taking  off regular lower body clothing?: A Lot 6 Click Score: 15   End of Session Equipment Utilized During Treatment: Oxygen;Rolling walker;Gait belt  Activity Tolerance: Patient limited by fatigue Patient left: with call bell/phone within  reach;in bed;with bed alarm set  OT Visit Diagnosis: Unsteadiness on feet (R26.81);Other abnormalities of gait and mobility (R26.89);Hemiplegia and hemiparesis;Muscle weakness (generalized) (M62.81);Other symptoms and signs involving cognitive function Hemiplegia - Right/Left: Left Hemiplegia - dominant/non-dominant: Non-Dominant                Time: 1305-1330 OT Time Calculation (min): 25 min Charges:  OT General Charges $OT Visit: 1 Visit OT Evaluation $OT Eval Moderate Complexity: 1 Mod OT Treatments $Self Care/Home Management : 8-22 mins    Galen Manila 05/22/2020, 3:39 PM

## 2020-05-22 NOTE — Progress Notes (Addendum)
      [address]       [name] [address]             [phone]           Subjective: Patient just waking up. She states her breathing is ok. She has pain at chest tube sites.  Objective: Vital signs in last 24 hours: Temp:  [97.6 F (36.4 C)] 97.6 F (36.4 C) (12/08 0813) Pulse Rate:  [60-82] 82 (12/08 0934) Cardiac Rhythm: Normal sinus rhythm;Bundle branch block (12/08 1943) Resp:  [16] 16 (12/08 0856) BP: (128)/(66) 128/66 (12/08 0813) SpO2:  [98 %-100 %] 98 % (12/08 2028)      Intake/Output from previous day: 12/08 0701 - 12/09 0700 In: 1101.2 [I.V.:146.8; IV Piggyback:954.4] Out: 270 [Chest Tube:270]   Physical Exam:  Cardiovascular: RRR Pulmonary: Clear on the left and coarse on the right Wounds: Dressing is clean and dry.   Chest Tubes :to suction. Pigtail #2 has small air leak.  Lab Results: CBC: Recent Labs    05/19/20 2129 05/22/20 0135  WBC 18.5* 8.6  HGB 12.3 10.9*  HCT 40.2 35.5*  PLT 681* 262   BMET:  Recent Labs    05/19/20 2129  NA 143  K 3.7  CL 104  CO2 27  GLUCOSE 99  BUN 13  CREATININE 0.86  CALCIUM 9.2    PT/INR: No results for input(s): LABPROT, INR in the last 72 hours. ABG:  INR: Will add last result for INR, ABG once components are confirmed Will add last 4 CBG results once components are confirmed  Assessment/Plan:  1. CV - SR with HR in the 80's. 2.  Pulmonary - On 2 liters of oxygen. Wean as able. Chest tubes with 270 cc (total for both) for 12 hours . I marked both Pleura vacs this am for more accurate output. Chest tubes to suction, no air leak from pigtail #1 but a small air leak in pigtail #2. CXR this am appears to show stable,right lateral pneumothorax, minor subcutaneous emphysema right lateral chest wall, and small right pleural effusion.  Chest tubes to remain for now. Check CXR in am. Patient not a surgical candidate. 3. History of CVA, recent DVT-continue Heparin drip per  pharmacy 4. ID-on Cefepime and Vancomycin for necrotizing PNA  Donielle M ZimmermanPA-C 05/22/2020,7:09 AM [phone]  On my exam air leak from both chest tubes - will need to stay  Patient is not operative candidate for operative procedure  I have seen and examined Beverly Howell and agree with the above assessment  and plan.  [name] MD Beeper [phone] Office [phone] 05/22/2020 8:18 AM

## 2020-05-22 NOTE — Progress Notes (Signed)
CRITICAL VALUE ALERT  Critical Value:  Vancomycin 21  Date & Time Notied:  05/22/20; 1140  Provider Notified: Dr. Jerral Ralph  Orders Received/Actions taken: Await new orders as necessary

## 2020-05-22 NOTE — Progress Notes (Signed)
ANTICOAGULATION CONSULT NOTE   Pharmacy Consult for Heparin  Indication: Recent DVT, Recent CVA   No Known Allergies  Vital Signs:     Weight: 98 kg Height:  65 inches Heparin Dosing Weight: 79.3 kg  Labs: Recent Labs    05/19/20 2129 05/19/20 2309 05/20/20 1237 05/22/20 0135  HGB 12.3  --   --  10.9*  HCT 40.2  --   --  35.5*  PLT 681*  --   --  262  APTT  --   --  39* 37*  HEPARINUNFRC  --   --   --  0.76*  CREATININE 0.86  --   --   --   TROPONINIHS 12 12  --   --     Assessment: 79 yr old female with shortness of breath, recent COVID, recent DVT on apixaban PTA; apixaban on hold for IR procedure and pharmacy was consulted to dose IV heparin. Of note, patient required placement of two chest tubes on 12/7 and heparin was resumed  Heparin level 0.76 (affected by apixaban), PTT remains subtherapeutic (37 sec) on gtt at 900 units/hr. RN reported that heparin off from ~2100-2400 so these labs are not accurate.  Goal of Therapy:  Heparin level 0.3-0.5 units/ml, recent CVA aPTT 66-84 secs Monitor platelets by anticoagulation protocol: Yes   Plan:  - Continue heparin at 900 units/hr - Will f/u PTT 8 hr from restart  Christoper Fabian, PharmD, BCPS Please see amion for complete clinical pharmacist phone list 05/22/2020  3:38 AM

## 2020-05-22 NOTE — Progress Notes (Signed)
ANTICOAGULATION CONSULT NOTE   Pharmacy Consult for Heparin  Indication: Recent DVT, Recent CVA   No Known Allergies  Vital Signs: Temp: 98 F (36.7 C) (12/09 1727) Temp Source: Oral (12/09 1727) BP: 131/63 (12/09 1727) Pulse Rate: 99 (12/09 1727)   Weight: 98 kg Height:  65 inches Heparin Dosing Weight: 79.3 kg  Labs: Recent Labs    05/19/20 2129 05/19/20 2309 05/20/20 1237 05/22/20 0135 05/22/20 0727 05/22/20 1710  HGB 12.3  --   --  10.9*  --   --   HCT 40.2  --   --  35.5*  --   --   PLT 681*  --   --  262  --   --   APTT  --   --    < > 37* 47* 77*  HEPARINUNFRC  --   --   --  0.76*  --   --   CREATININE 0.86  --   --  0.80  --   --   TROPONINIHS 12 12  --   --   --   --    < > = values in this interval not displayed.    Assessment: 79 yr old female with shortness of breath, recent COVID, recent DVT on apixaban PTA; apixaban on hold for IR procedure and pharmacy was consulted to dose IV heparin. Of note, patient required placement of two chest tubes on 12/7 and heparin was resumed   PTT now therapeutic (77sec) on gtt at 1050 units/hr. No pauses or interruptions to heparin since restart per nursing, no s/sx of bleeding.  Goal of Therapy:  Heparin level 0.3-0.5 units/ml, recent CVA aPTT 66-84 secs Monitor platelets by anticoagulation protocol: Yes   Plan:  - Continue heparin infusion at 1050 units/hr - Will f/u PTT and heparin level in AM - Monitor daily HL/aPTT until correlate, CBC, and for s/sx of bleeding   Absalom Aro A. Jeanella Craze, PharmD, BCPS, FNKF Clinical Pharmacist [place] Please utilize Amion for appropriate phone number to reach the unit pharmacist [facility])   Please check AMION for all [facility] phone numbers After 10:00 PM, call Main Pharmacy [phone]

## 2020-05-22 NOTE — Progress Notes (Signed)
ANTICOAGULATION CONSULT NOTE   Pharmacy Consult for Heparin  Indication: Recent DVT, Recent CVA   No Known Allergies  Vital Signs: Temp: 97.8 F (36.6 C) (12/09 0812) Temp Source: Oral (12/09 0812) BP: 137/65 (12/09 0812) Pulse Rate: 84 (12/09 0812)   Weight: 98 kg Height:  65 inches Heparin Dosing Weight: 79.3 kg  Labs: Recent Labs    05/19/20 2129 05/19/20 2309 05/20/20 1237 05/22/20 0135 05/22/20 0727  HGB 12.3  --   --  10.9*  --   HCT 40.2  --   --  35.5*  --   PLT 681*  --   --  262  --   APTT  --   --  39* 37* 47*  HEPARINUNFRC  --   --   --  0.76*  --   CREATININE 0.86  --   --  0.80  --   TROPONINIHS 12 12  --   --   --     Assessment: 79 yr old female with shortness of breath, recent COVID, recent DVT on apixaban PTA; apixaban on hold for IR procedure and pharmacy was consulted to dose IV heparin. Of note, patient required placement of two chest tubes on 12/7 and heparin was resumed  Heparin level 0.76 (affected by apixaban) earlier today, PTT remains subtherapeutic (47sec) on gtt at 900 units/hr. No pauses or interruptions to heparin since restart per nursing, no s/sx of bleeding.  Goal of Therapy:  Heparin level 0.3-0.5 units/ml, recent CVA aPTT 66-84 secs Monitor platelets by anticoagulation protocol: Yes   Plan:  - Increase heparin infusion to 1050 units/hr - Will f/u PTT 8 hr - Monitor daily HL/aPTT until correlate, CBC, and for s/sx of bleeding   Sherron Monday, PharmD, BCCCP Clinical Pharmacist  Phone: [phone] 05/22/2020 9:03 AM  Please check AMION for all [facility] phone numbers After 10:00 PM, call Main Pharmacy [phone]

## 2020-05-22 NOTE — Progress Notes (Signed)
PROGRESS NOTE        [name] Age: 79 y.o. Sex: female Date of Birth: [birth year] Admit Date: 05/19/2020 Admitting Physician Eduard Clos, MD DSK:[id number], Provider Not In  Brief Narrative: Patient is a 79 y.o. female with history of recent severe COVID-19 infection-along with CVA/lower extremity DVT-presenting to the hospital with shortness of breath-found to have necrotizing right-sided pneumonia with loculated hydropneumothorax.  See below for further details.  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  Multifocal pneumonia.  Antimicrobial therapy: Vancomycin: 12/6>> Cefepime: 12/6>> Zithromax: 12/6>> 12/8  Microbiology data: 12/7>> blood cultures: No growth 12/7>> right pleural fluid culture: Pending  Procedures : 12/7>> CT-guided chest tube placement x2 by interventional radiology  Consults: CT surgery  DVT Prophylaxis : IV heparin   Subjective: No major issues overnight.   Assessment/Plan: Right-sided necrotizing pneumonia with loculated pleural effusion-probable empyema: Symptomatically better following chest tube placement-on empiric antimicrobial therapy-await culture data.  Suspect that this could possibly be a sequelae of her recent severe COVID-19 infection.  Appreciate CT surgery input-patient not a candidate for VATS/surgical procedure.  History of recent right MCA CVA with left-sided weakness: Thought to be  provoked due to COVID-19 hypercoagulable state-minimal left-sided weakness persists.  Resume PT/OT eval while inpatient-maintain on IV heparin.  We will plan to resume oral anticoagulation when thought not to require any further surgical procedures.   Recent history of bilateral lower extremity DVT: Thought to be provoked due to COVID-19 hypercoagulable state-currently on IV heparin with plans to resume oral anticoagulation when she does not require any further surgical procedures.  HTN: BP controlled-continue metoprolol  Recent COVID-19 infection with severe hypoxemia: Does not require any further isolation as > 3 weeks out from her initial presentation.   Diet: Diet Order            Diet Heart Room service appropriate? Yes; Fluid consistency: Thin  Diet effective now                  Code Status: Full code   Family Communication: Daughter [phone] 12/9  Disposition Plan: Status is: Inpatient  Remains inpatient appropriate because:Inpatient level of care appropriate due to severity of illness   Dispo: The patient is from: Home              Anticipated d/c is to: TBD              Anticipated d/c date is: > 3 days              Patient currently is not medically stable to d/c.   Barriers to Discharge: Necrotizing pneumonia with complicated pleural effusion-chest tube in place-on IV antibiotics.  Antimicrobial agents: Anti-infectives (From admission, onward)   Start     Dose/Rate Route Frequency Ordered Stop   05/23/20 0300  vancomycin (VANCOREADY) IVPB 750 mg/150 mL        750 mg 150 mL/hr over 60 Minutes Intravenous Every 12 hours 05/22/20 1149     05/23/20 0100  vancomycin (VANCOREADY) IVPB 750 mg/150 mL  Status:  Discontinued        750 mg 150 mL/hr over 60 Minutes Intravenous Every 12 hours 05/22/20 1146 05/22/20 1149   05/20/20 2200  vancomycin (VANCOCIN) IVPB 1000 mg/200 mL premix  Status:  Discontinued        1,000 mg 200 mL/hr over 60 Minutes  Intravenous Every 12 hours 05/20/20 1401 05/22/20 1146   05/20/20 1000  ceFEPIme (MAXIPIME) 2 g in sodium chloride 0.9 % 100 mL IVPB        2 g 200 mL/hr over 30 Minutes Intravenous Every 8 hours 05/20/20 0226 05/25/20 0959   05/20/20 1000  vancomycin (VANCOREADY) IVPB 500 mg/100 mL  Status:  Discontinued        500 mg 100 mL/hr over 60 Minutes Intravenous Every 12 hours 05/20/20 0240 05/20/20 1401   05/20/20 0230  azithromycin (ZITHROMAX) 500 mg in sodium chloride 0.9 % 250 mL IVPB  Status:  Discontinued        500 mg 250 mL/hr over 60 Minutes Intravenous Daily at bedtime 05/20/20 0226 05/21/20 1443   05/20/20 0100  vancomycin (VANCOCIN) IVPB 1000 mg/200 mL premix        1,000 mg 200 mL/hr over 60 Minutes Intravenous  Once 05/20/20 0046 05/20/20 0402   05/20/20 0045  ceFEPIme (MAXIPIME) 2 g in sodium chloride 0.9 % 100 mL IVPB        2 g 200 mL/hr over 30 Minutes Intravenous  Once 05/20/20 0030 05/20/20 0221       Time spent: 25-minutes-Greater than 50% of this time was spent in counseling, explanation of diagnosis, planning of further management, and coordination of care.  MEDICATIONS: Scheduled Meds: . amLODipine  5 mg Oral Daily  . atorvastatin  40 mg Oral Daily  . ipratropium-albuterol  3 mL Nebulization QID  . mouth rinse  15 mL Mouth Rinse BID  . metoprolol tartrate  50 mg Oral BID  . mometasone-formoterol  2 puff Inhalation BID  . sodium chloride flush  10-40 mL Intracatheter Q12H   Continuous Infusions: . ceFEPime (MAXIPIME) IV 2 g (05/22/20 0920)  . heparin 1,050 Units/hr (05/22/20 1119)  . [START ON 05/23/2020] vancomycin     PRN Meds:.acetaminophen **OR** acetaminophen, oxyCODONE-acetaminophen, sodium chloride flush   PHYSICAL EXAM: Vital signs: Vitals:   05/21/20 2028 05/22/20 0812 05/22/20 0832 05/22/20 1350  BP:  137/65  (!) 113/99  Pulse:  84  88  Resp:  (!) 22  20  Temp:  97.8 F (36.6 C)  97.8 F (36.6 C)  TempSrc:  Oral  Oral  SpO2: 98% 100% 100%  100%  Weight:      Height:       Filed Weights   05/20/20 1330 05/21/20 0028  Weight: 98 kg 81.5 kg   Body mass index is 29.9 kg/m.   Gen Exam:Alert awake-not in any distress HEENT:atraumatic, normocephalic Chest: B/L clear to auscultation anteriorly CVS:S1S2 regular Abdomen:soft non tender, non distended Extremities:no edema Neurology: Non focal Skin: no rash  I have personally reviewed following labs and imaging studies  LABORATORY DATA: CBC: Recent Labs  Lab 05/19/20 2129 05/22/20 0135  WBC 18.5* 8.6  NEUTROABS 14.3*  --   HGB 12.3 10.9*  HCT 40.2 35.5*  MCV 101.3* 99.2  PLT 681* 262    Basic Metabolic Panel: Recent Labs  Lab 05/19/20 2129 05/22/20 0135  NA 143 140  K 3.7 3.5  CL 104 105  CO2 27 26  GLUCOSE  99 90  BUN 13 6*  CREATININE 0.86 0.80  CALCIUM 9.2 8.2*    GFR: Estimated Creatinine Clearance: 61.1 mL/min (by C-G formula based on SCr of 0.8 mg/dL).  Liver Function Tests: Recent Labs  Lab 05/19/20 2129 05/22/20 0135  AST 26 16  ALT 29 17  ALKPHOS 82 59  BILITOT 0.7 0.7  PROT 6.7 5.6*  ALBUMIN 2.5* 1.9*   No results for input(s): LIPASE, AMYLASE in the last 168 hours. No results for input(s): AMMONIA in the last 168 hours.  Coagulation Profile: No results for input(s): INR, PROTIME in the last 168 hours.  Cardiac Enzymes: No results for input(s): CKTOTAL, CKMB, CKMBINDEX, TROPONINI in the last 168 hours.  BNP (last 3 results) No results for input(s): PROBNP in the last 8760 hours.  Lipid Profile: No results for input(s): CHOL, HDL, LDLCALC, TRIG, CHOLHDL, LDLDIRECT in the last 72 hours.  Thyroid Function Tests: No results for input(s): TSH, T4TOTAL, FREET4, T3FREE, THYROIDAB in the last 72 hours.  Anemia Panel: No results for input(s): VITAMINB12, FOLATE, FERRITIN, TIBC, IRON, RETICCTPCT in the last 72 hours.  Urine analysis:    Component Value Date/Time   COLORURINE AMBER (A) 04/22/2020 1205   APPEARANCEUR HAZY (A)  04/22/2020 1205   LABSPEC 1.026 04/22/2020 1205   PHURINE 5.0 04/22/2020 1205   GLUCOSEU NEGATIVE 04/22/2020 1205   HGBUR NEGATIVE 04/22/2020 1205   BILIRUBINUR NEGATIVE 04/22/2020 1205   KETONESUR 5 (A) 04/22/2020 1205   PROTEINUR 30 (A) 04/22/2020 1205   UROBILINOGEN 0.2 02/03/2014 2029   NITRITE NEGATIVE 04/22/2020 1205   LEUKOCYTESUR NEGATIVE 04/22/2020 1205    Sepsis Labs: Lactic Acid, Venous    Component Value Date/Time   LATICACIDVEN 1.6 05/19/2020 2309    MICROBIOLOGY: Recent Results (from the past 240 hour(s))  Blood culture (routine x 2)     Status: None (Preliminary result)   Collection Time: 05/20/20  1:14 AM   Specimen: BLOOD  Result Value Ref Range Status   Specimen Description BLOOD SITE NOT SPECIFIED  Final   Special Requests   Final    BOTTLES DRAWN AEROBIC AND ANAEROBIC Blood Culture adequate volume   Culture   Final    NO GROWTH 2 DAYS Performed at [facility], [address] [place], [place] [address]    Report Status PENDING  Incomplete  Blood culture (routine x 2)     Status: None (Preliminary result)   Collection Time: 05/20/20  3:55 AM   Specimen: BLOOD  Result Value Ref Range Status   Specimen Description BLOOD A-LINE  Final   Special Requests   Final    BOTTLES DRAWN AEROBIC AND ANAEROBIC Blood Culture adequate volume   Culture   Final    NO GROWTH 2 DAYS Performed at [facility], [address] [place], [place] [address]    Report Status PENDING  Incomplete  Aerobic/Anaerobic Culture (surgical/deep wound)     Status: None (Preliminary result)   Collection Time: 05/20/20  8:57 PM   Specimen: Abscess  Result Value Ref Range Status   Specimen Description ABSCESS RIGHT PLEURAL  Final   Special Requests SYRINGE  Final   Gram Stain   Final    ABUNDANT WBC PRESENT, PREDOMINANTLY PMN FEW GRAM POSITIVE COCCI IN PAIRS IN CLUSTERS    Culture   Final    CULTURE REINCUBATED FOR BETTER GROWTH Performed at [facility],  [address] [place], [place] [address]    Report Status PENDING  Incomplete    RADIOLOGY STUDIES/RESULTS: DG Chest Port 1 View  Result Date: 05/22/2020 CLINICAL DATA:  Chest tube. EXAM: PORTABLE CHEST 1 VIEW COMPARISON:  05/21/2020. FINDINGS: Two right chest tubes in stable position. Stable right pneumothorax and small right pleural effusion. Persistent bibasilar atelectasis/infiltrates. Heart size normal. Degenerative change thoracic spine. Stable right chest wall subcutaneous emphysema. IMPRESSION: 1. Two right chest tubes in stable position. Stable right pneumothorax and small right pleural effusion. Stable right chest wall subcutaneous emphysema. 2. Persistent bibasilar atelectasis/infiltrates. Electronically Signed   By: Maisie Fus  Register   On: 05/22/2020 06:16   DG Chest Port 1 View  Result Date: 05/21/2020 CLINICAL DATA:  Chest tube in place. EXAM: PORTABLE CHEST 1 VIEW COMPARISON:  May 19, 2020. FINDINGS: Stable cardiomediastinal silhouette. Interval placement of 2 right-sided chest tubes. Right pneumothorax is significantly smaller compared to prior exam. Some degree of pleural effusion is also noted. Mild subcutaneous emphysema is seen over right lateral chest wall. Bilateral lung opacities are noted which may represent multifocal pneumonia. Bony thorax is unremarkable. IMPRESSION: Interval placement of 2 right-sided chest tubes. Right pneumothorax is significantly smaller compared to prior exam. Some degree of pleural effusion is also noted. Bilateral lung opacities are noted which may represent multifocal pneumonia. Electronically Signed   By: Lupita Raider M.D.   On: 05/21/2020 08:41   CT IMAGE GUIDED DRAINAGE BY PERCUTANEOUS CATHETER  Result Date: 05/21/2020 INDICATION: 79 year old female with history of recent COVID pneumonia presenting with large right hydropneumothorax. EXAM: CT IMAGE GUIDED DRAINAGE BY PERCUTANEOUS CATHETER COMPARISON:  Chest CT from 03/03/2020 MEDICATIONS: The  patient is currently admitted to the hospital and receiving intravenous antibiotics. The antibiotics were administered within an appropriate time frame prior to the initiation of the procedure. ANESTHESIA/SEDATION: Moderate (conscious) sedation was employed during this procedure. A total of Versed 3 mg and Fentanyl 150 mcg was administered intravenously. Moderate Sedation Time: 37 minutes. The patient's level of consciousness and vital signs were monitored continuously by radiology nursing throughout the procedure under my direct supervision. CONTRAST:  None COMPLICATIONS: None immediate. PROCEDURE: Informed written consent was obtained from the patient after a discussion of the risks, benefits and alternatives to treatment. The patient was placed supine on the CT gantry and a pre procedural CT was performed re-demonstrating the large right hydropneumothorax. The procedure was planned. A timeout was performed prior to the initiation of the procedure. The anterior, right superior thorax was prepped and draped in the usual sterile fashion. The overlying soft tissues were anesthetized with 1% lidocaine with epinephrine. An 18 gauge trocar needle was advanced into the pleural space and a short Amplatz super stiff wire was coiled within the collection. Appropriate positioning was confirmed with a limited CT scan. The tract was serially dilated allowing placement of a 14 French all-purpose drainage catheter. Appropriate positioning was confirmed with a limited postprocedural CT scan. A total of approximately 100 ml of thick, purulence and sanguinous fluid was aspirated. Samples were sent to the lab for fluid analysis. The tube was connected to a pleurovac and sutured in place. A dressing was placed. Next, the patient was placed in a partial left lateral decubitus position and an additional limited range CT of the chest was performed demonstrated persistent fluid collection in the basal pleura. Additional thoracostomy tube  placement was planned. The right mid axillary line was prepped and draped in the usual sterile fashion. The overlying soft tissues were anesthetized with 1% lidocaine with epinephrine. An 18 gauge trocar needle was advanced  into the pleural space and a short Amplatz super stiff wire was coiled within the collection. Appropriate positioning was confirmed with a limited CT scan. The tract was serially dilated allowing placement of a 14 French all-purpose drainage catheter. Appropriate positioning was confirmed with a limited postprocedural CT scan. The patient tolerated the procedure well without immediate post procedural complication. IMPRESSION: Successful CT guided placement of a 22 French all purpose drain catheter into the right lung apex as well as additional 14 [place] all-purpose drain catheter into the posterolateral and inferior right pleural space with aspiration of thick, purulence thin sanguinous fluid. Samples were sent to the laboratory as requested by the ordering clinical team. Marliss Coots, MD [facility] Radiology Electronically Signed   By: Marliss Coots MD   On: 05/21/2020 08:12     LOS: 2 days   Jeoffrey Massed, MD  [facility]    To contact the attending provider between 7A-7P or the covering provider during after hours 7P-7A, please log into the web site [email or web address] and access using universal [place] password for that web site. If you do not have the password, please call the hospital operator.  05/22/2020, 2:08 PM

## 2020-05-22 NOTE — Progress Notes (Signed)
Pharmacy Antibiotic Note  Beverly Howell is a 79 y.o. female admitted on 05/19/2020 with concern for PNA. Pharmacy has been consulted for Vancomycin dosing along with Cefepime.   Currently on day #3 of vancomycin. Scr stable at 0.8 (CrCl 61 mL/min). WBC 8.6, afebrile. Vancomycin trough drawn ~12 hours after last dose came back slightly supratherapeutic at 21.   Plan: - Adjust Vancomycin to 750 mg IV every 12 hours  - Continue Cefepime 2g IV every 8 hr - Will continue to follow renal function, culture results, LOT, and antibiotic de-escalation plans   Height: 5\' 5"  (165.1 cm) Weight: 81.5 kg (179 lb 10.8 oz) IBW/kg (Calculated) : 57  Temp (24hrs), Avg:97.8 F (36.6 C), Min:97.8 F (36.6 C), Max:97.8 F (36.6 C)  Recent Labs  Lab 05/19/20 2116 05/19/20 2129 05/19/20 2309 05/22/20 0135 05/22/20 1003  WBC  --  18.5*  --  8.6  --   CREATININE  --  0.86  --  0.80  --   LATICACIDVEN 2.0*  --  1.6  --   --   VANCOTROUGH  --   --   --   --  21*    Estimated Creatinine Clearance: 61.1 mL/min (by C-G formula based on SCr of 0.8 mg/dL).    No Known Allergies  Antimicrobials this admission: Vanc 12/7 >> Cefepime 12/7 >> Azithro 12/7 (for 2 doses)  Microbiology results: 12/7 BCx >> ngtd 12/7 pleural abscess- GPC/clusters  Thank you for allowing pharmacy to be a part of this patient's care.  14/7, PharmD, BCCCP Clinical Pharmacist  Phone: [phone] 05/22/2020 11:50 AM  Please check AMION for all [facility] Pharmacy phone numbers After 10:00 PM, call Main Pharmacy [phone]

## 2020-05-23 ENCOUNTER — Inpatient Hospital Stay (HOSPITAL_COMMUNITY): Payer: Medicare (Managed Care)

## 2020-05-23 LAB — CBC
HCT: 29.9 % — ABNORMAL LOW (ref 36.0–46.0)
Hemoglobin: 9.7 g/dL — ABNORMAL LOW (ref 12.0–15.0)
MCH: 31.3 pg (ref 26.0–34.0)
MCHC: 32.4 g/dL (ref 30.0–36.0)
MCV: 96.5 fL (ref 80.0–100.0)
Platelets: 442 10*3/uL — ABNORMAL HIGH (ref 150–400)
RBC: 3.1 MIL/uL — ABNORMAL LOW (ref 3.87–5.11)
RDW: 16.4 % — ABNORMAL HIGH (ref 11.5–15.5)
WBC: 9.6 10*3/uL (ref 4.0–10.5)
nRBC: 0 % (ref 0.0–0.2)

## 2020-05-23 LAB — COMPREHENSIVE METABOLIC PANEL
ALT: 15 U/L (ref 0–44)
AST: 18 U/L (ref 15–41)
Albumin: 1.8 g/dL — ABNORMAL LOW (ref 3.5–5.0)
Alkaline Phosphatase: 55 U/L (ref 38–126)
Anion gap: 11 (ref 5–15)
BUN: 7 mg/dL — ABNORMAL LOW (ref 8–23)
CO2: 22 mmol/L (ref 22–32)
Calcium: 8.3 mg/dL — ABNORMAL LOW (ref 8.9–10.3)
Chloride: 110 mmol/L (ref 98–111)
Creatinine, Ser: 0.82 mg/dL (ref 0.44–1.00)
GFR, Estimated: >60 mL/min (ref >60)
Glucose, Bld: 93 mg/dL (ref 70–99)
Potassium: 3.9 mmol/L (ref 3.5–5.1)
Sodium: 143 mmol/L (ref 135–145)
Total Bilirubin: 0.8 mg/dL (ref 0.3–1.2)
Total Protein: 5.3 g/dL — ABNORMAL LOW (ref 6.5–8.1)

## 2020-05-23 LAB — HEPARIN LEVEL (UNFRACTIONATED)
Heparin Unfractionated: 0.63 IU/mL (ref 0.30–0.70)
Heparin Unfractionated: 0.3 IU/mL (ref 0.30–0.70)

## 2020-05-23 LAB — APTT: aPTT: 127 seconds — ABNORMAL HIGH (ref 24–36)

## 2020-05-23 MED ORDER — IPRATROPIUM-ALBUTEROL 0.5-2.5 (3) MG/3ML IN SOLN
3.0000 mL | Freq: Two times a day (BID) | RESPIRATORY_TRACT | Status: DC
  Administered 2020-05-23 – 2020-06-06 (×28): 3 mL via RESPIRATORY_TRACT
  Filled 2020-05-23 (×29): qty 3

## 2020-05-23 MED ORDER — ENOXAPARIN SODIUM 80 MG/0.8ML ~~LOC~~ SOLN
80.0000 mg | Freq: Two times a day (BID) | SUBCUTANEOUS | Status: DC
  Administered 2020-05-24 – 2020-06-05 (×25): 80 mg via SUBCUTANEOUS
  Filled 2020-05-23 (×25): qty 0.8

## 2020-05-23 MED ORDER — CEFAZOLIN SODIUM-DEXTROSE 2-4 GM/100ML-% IV SOLN
2.0000 g | Freq: Three times a day (TID) | INTRAVENOUS | Status: DC
  Administered 2020-05-23 – 2020-05-27 (×12): 2 g via INTRAVENOUS
  Filled 2020-05-23 (×16): qty 100

## 2020-05-23 NOTE — Progress Notes (Signed)
ANTICOAGULATION CONSULT NOTE   Pharmacy Consult for Heparin  Indication: Recent DVT, Recent CVA   No Known Allergies  Vital Signs: Temp: 97.4 F (36.3 C) (12/10 0025) Temp Source: Oral (12/10 0025) BP: 109/74 (12/10 0025) Pulse Rate: 88 (12/10 0025)   Weight: 98 kg Height:  65 inches Heparin Dosing Weight: 79.3 kg  Labs: Recent Labs    05/22/20 0135 05/22/20 0727 05/22/20 1710 05/23/20 0116  HGB 10.9*  --   --   --   HCT 35.5*  --   --   --   PLT 262  --   --   --   APTT 37* 47* 77* 127*  HEPARINUNFRC 0.76*  --   --  0.63  CREATININE 0.80  --   --   --     Assessment: 79 yr old female with shortness of breath, recent COVID, recent DVT on apixaban PTA; apixaban on hold for IR procedure and pharmacy was consulted to dose IV heparin. Of note, patient required placement of two chest tubes on 12/7 and heparin was resumed  PTT up to 127 sec, heparin level 0.63 (supratherapeutic) on gtt at 1050 units/hr. No longer need to do PTT monitoring. No bleeding noted.  Goal of Therapy:  Heparin level 0.3-0.5 units/ml, recent CVA Monitor platelets by anticoagulation protocol: Yes   Plan:  - Decrease heparin infusion to 950 units/hr - Will f/u 8 hr heparin level  Christoper Fabian, PharmD, BCPS Please see amion for complete clinical pharmacist phone list 05/23/2020 2:57 AM

## 2020-05-23 NOTE — Progress Notes (Signed)
Pharmacy Antibiotic Note  Beverly Howell is a 79 y.o. female admitted on 05/19/2020 with MSSA nectrotizing pneumonia.  Pharmacy has been consulted for cefazolin dosing.  MSSA grew from pleural culture. Switch from vanc/cefe to cefazolin. CLCr ~60 ml/min. WBC wnl.   Plan: Stop vancomycin and cefepime Start cefazolin 2g q8hr F/u duration   Height: 5\' 5"  (165.1 cm) Weight: 81.5 kg (179 lb 10.8 oz) IBW/kg (Calculated) : 57  Temp (24hrs), Avg:97.8 F (36.6 C), Min:97.4 F (36.3 C), Max:98 F (36.7 C)  Recent Labs  Lab 05/19/20 2116 05/19/20 2129 05/19/20 2309 05/22/20 0135 05/22/20 1003 05/23/20 1033  WBC  --  18.5*  --  8.6  --  9.6  CREATININE  --  0.86  --  0.80  --  0.82  LATICACIDVEN 2.0*  --  1.6  --   --   --   VANCOTROUGH  --   --   --   --  21*  --     Estimated Creatinine Clearance: 59.6 mL/min (by C-G formula based on SCr of 0.82 mg/dL).    No Known Allergies  Antimicrobials this admission: Cefaz 12/10>>  Vanc 12/7 >>12/10 Cefepime 12/7 >>12/10 Azithro 12/7 (for 2 doses)  VT 21 on 12/9 - change to 750 mg IV q12 hrs  Microbiology 12/7 BCx >> ngtd 12/7 pleural abscess- MSSA   Thank you for allowing pharmacy to be a part of this patient's care.   14/7, PharmD, BCPS, BCCP Clinical Pharmacist  Please check AMION for all [facility] Pharmacy phone numbers After 10:00 PM, call Main Pharmacy [phone]

## 2020-05-23 NOTE — Progress Notes (Signed)
PROGRESS NOTE        [name] Age: 79 y.o. Sex: female Date of Birth: [birth year] Admit Date: 05/19/2020 Admitting Physician Eduard Clos, MD IEP:PIRJJO, Provider Not In  Brief Narrative: Patient is a 79 y.o. female with history of recent severe COVID-19 infection-along with CVA/lower extremity DVT-presenting to the hospital with shortness of breath-found to have necrotizing right-sided pneumonia with loculated hydropneumothorax.  See below for further details.  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  Multifocal pneumonia.  Antimicrobial therapy: Ancef:12/10>> Vancomycin: 12/6>>12/10 Cefepime: 12/6>>12/10 Zithromax: 12/6>> 12/8  Microbiology data: 12/7>> blood cultures: No growth 12/7>> right pleural fluid culture: MSSA  Procedures : 12/7>> CT-guided chest tube placement x2 by interventional radiology  Consults: CT surgery  DVT Prophylaxis : IV heparin>>SQ therapeutic Lovenox   Subjective: Lying comfortably in bed-no major issues overnight.  Assessment/Plan: MSSA necrotizing pneumonia of right lung with empyema: Pleural fluid cultures positive for MSSA-stop Vanco/cefepime-start IV Ancef.  Chest tube in place-CT surgery following.  History of recent right MCA CVA with left-sided weakness: Thought to be provoked due to COVID-19 hypercoagulable state-minimal left-sided weakness persists.  Switch from IV heparin to SQ  Lovenox-if she continues to be stable-we will resume oral anticoagulation over the next few days.  Recent history of bilateral lower extremity DVT: Thought to be provoked due to COVID-19 hypercoagulable state-currently on therapeutic anticoagulation  HTN: BP controlled-continue metoprolol  Recent COVID-19 infection with severe hypoxemia: Does not require any further isolation as > 3 weeks out from her initial presentation.   Diet: Diet Order            Diet Heart Room service appropriate? Yes; Fluid consistency: Thin  Diet effective now                  Code Status: Full code   Family Communication: Daughter [phone] 12/10  Disposition Plan: Status is: Inpatient  Remains inpatient appropriate because:Inpatient level of care appropriate due to severity of illness   Dispo: The patient is from: Home              Anticipated d/c is to: TBD              Anticipated d/c date is: > 3 days              Patient currently is not medically stable to d/c.   Barriers to Discharge: MSSA pneumonia with empyema-on IV antibiotics-chest tube in place  Antimicrobial agents: Anti-infectives (From admission, onward)   Start     Dose/Rate Route Frequency Ordered Stop   05/23/20 0300  vancomycin (VANCOREADY) IVPB 750 mg/150 mL  Status:  Discontinued        750 mg 150 mL/hr over 60 Minutes Intravenous Every 12 hours 05/22/20 1149 05/23/20 1356   05/23/20 0100  vancomycin (VANCOREADY) IVPB 750 mg/150 mL  Status:  Discontinued        750 mg 150 mL/hr over 60 Minutes Intravenous Every 12 hours 05/22/20 1146 05/22/20 1149  05/20/20 2200  vancomycin (VANCOCIN) IVPB 1000 mg/200 mL premix  Status:  Discontinued        1,000 mg 200 mL/hr over 60 Minutes Intravenous Every 12 hours 05/20/20 1401 05/22/20 1146   05/20/20 1000  ceFEPIme (MAXIPIME) 2 g in sodium chloride 0.9 % 100 mL IVPB  Status:  Discontinued        2 g 200 mL/hr over 30 Minutes Intravenous Every 8 hours 05/20/20  0226 05/23/20 1356   05/20/20 1000  vancomycin (VANCOREADY) IVPB 500 mg/100 mL  Status:  Discontinued        500 mg 100 mL/hr over 60 Minutes Intravenous Every 12 hours 05/20/20 0240 05/20/20 1401   05/20/20 0230  azithromycin (ZITHROMAX) 500 mg in sodium chloride 0.9 % 250 mL IVPB  Status:  Discontinued        500 mg 250 mL/hr over 60 Minutes Intravenous Daily at bedtime 05/20/20 0226 05/21/20 1443   05/20/20 0100  vancomycin (VANCOCIN) IVPB 1000 mg/200 mL premix        1,000 mg 200 mL/hr over 60 Minutes Intravenous  Once 05/20/20 0046 05/20/20 0402   05/20/20 0045  ceFEPIme (MAXIPIME) 2 g in sodium chloride 0.9 % 100 mL IVPB        2 g 200 mL/hr over 30 Minutes Intravenous  Once 05/20/20 0030 05/20/20 0221       Time spent: 25-minutes-Greater than 50% of this time was spent in counseling, explanation of diagnosis, planning of further management, and coordination of care.  MEDICATIONS: Scheduled Meds: . amLODipine  5 mg Oral Daily  . atorvastatin  40 mg Oral Daily  . influenza vaccine adjuvanted  0.5 mL Intramuscular Tomorrow-1000  . ipratropium-albuterol  3 mL Nebulization BID  . mouth rinse  15 mL Mouth Rinse BID  . metoprolol tartrate  50 mg Oral BID  . mometasone-formoterol  2 puff Inhalation BID  . pneumococcal 23 valent vaccine  0.5 mL Intramuscular Tomorrow-1000  . sodium chloride flush  10-40 mL Intracatheter Q12H   Continuous Infusions: . heparin 1,000 Units/hr (05/23/20 1140)   PRN Meds:.acetaminophen **OR** acetaminophen, oxyCODONE-acetaminophen, sodium chloride flush   PHYSICAL EXAM: Vital signs: Vitals:   05/23/20 0827 05/23/20 0829 05/23/20 1007 05/23/20 1315  BP:   (!) 153/72 133/70  Pulse:   84 86  Resp:    19  Temp:    97.9 F (36.6 C)  TempSrc:    Oral  SpO2: 98% 100%  100%  Weight:      Height:       Filed Weights   05/20/20 1330 05/21/20 0028  Weight: 98 kg 81.5 kg   Body mass index is 29.9 kg/m.   Gen Exam:Alert awake-not in any  distress HEENT:atraumatic, normocephalic Chest: B/L clear to auscultation anteriorly CVS:S1S2 regular Abdomen:soft non tender, non distended Extremities:no edema Neurology: Non focal Skin: no rash  I have personally reviewed following labs and imaging studies  LABORATORY DATA: CBC: Recent Labs  Lab 05/19/20 2129 05/22/20 0135 05/23/20 1033  WBC 18.5* 8.6 9.6  NEUTROABS 14.3*  --   --   HGB 12.3 10.9* 9.7*  HCT 40.2 35.5* 29.9*  MCV 101.3* 99.2 96.5  PLT 681* 262 442*    Basic Metabolic Panel: Recent Labs  Lab 05/19/20 2129 05/22/20 0135 05/23/20 1033  NA 143 140 143  K 3.7 3.5 3.9  CL 104 105 110  CO2 27 26 22   GLUCOSE 99 90 93  BUN 13 6* 7*  CREATININE 0.86 0.80 0.82  CALCIUM 9.2 8.2* 8.3*    GFR: Estimated Creatinine Clearance: 59.6 mL/min (by C-G formula based on SCr of 0.82 mg/dL).  Liver Function Tests: Recent Labs  Lab 05/19/20 2129 05/22/20 0135 05/23/20 1033  AST 26 16 18   ALT 29 17 15   ALKPHOS 82 59 55  BILITOT 0.7 0.7 0.8  PROT 6.7 5.6* 5.3*  ALBUMIN 2.5* 1.9* 1.8*   No results for input(s): LIPASE, AMYLASE in the last 168 hours. No results for input(s): AMMONIA in the last 168 hours.  Coagulation Profile: No results for input(s): INR, PROTIME in the last 168 hours.  Cardiac Enzymes: No results for input(s): CKTOTAL, CKMB, CKMBINDEX, TROPONINI in the last 168 hours.  BNP (last 3 results) No results for input(s): PROBNP in the last 8760 hours.  Lipid Profile: No results for input(s): CHOL, HDL, LDLCALC, TRIG, CHOLHDL, LDLDIRECT in the last 72 hours.  Thyroid Function Tests: No results for input(s): TSH, T4TOTAL, FREET4, T3FREE, THYROIDAB in the last 72 hours.  Anemia Panel: No results for input(s): VITAMINB12, FOLATE, FERRITIN, TIBC, IRON, RETICCTPCT in the last 72 hours.  Urine analysis:    Component Value Date/Time   COLORURINE AMBER (A) 04/22/2020 1205   APPEARANCEUR HAZY (A) 04/22/2020 1205   LABSPEC 1.026 04/22/2020  1205   PHURINE 5.0 04/22/2020 1205   GLUCOSEU NEGATIVE 04/22/2020 1205   HGBUR NEGATIVE 04/22/2020 1205   BILIRUBINUR NEGATIVE 04/22/2020 1205   KETONESUR 5 (A) 04/22/2020 1205   PROTEINUR 30 (A) 04/22/2020 1205   UROBILINOGEN 0.2 02/03/2014 2029   NITRITE NEGATIVE 04/22/2020 1205   LEUKOCYTESUR NEGATIVE 04/22/2020 1205    Sepsis Labs: Lactic Acid, Venous    Component Value Date/Time   LATICACIDVEN 1.6 05/19/2020 2309    MICROBIOLOGY: Recent Results (from the past 240 hour(s))  Blood culture (routine x 2)     Status: None (Preliminary result)   Collection Time: 05/20/20  1:14 AM   Specimen: BLOOD  Result Value Ref Range Status   Specimen Description BLOOD SITE NOT SPECIFIED  Final   Special Requests   Final    BOTTLES DRAWN AEROBIC AND ANAEROBIC Blood Culture adequate volume   Culture   Final    NO GROWTH 3 DAYS Performed at [facility], [address], [place], [address] [place]    Report Status PENDING  Incomplete  Blood culture (routine x 2)     Status: None (Preliminary result)   Collection Time: 05/20/20  3:55 AM   Specimen: BLOOD  Result Value Ref Range Status   Specimen Description BLOOD A-LINE  Final   Special Requests   Final    BOTTLES DRAWN AEROBIC AND ANAEROBIC Blood Culture adequate volume   Culture   Final    NO GROWTH 3 DAYS Performed at [facility], [address] [place], [address] [place]    Report Status PENDING  Incomplete  Aerobic/Anaerobic Culture (surgical/deep wound)     Status: None (Preliminary result)   Collection Time: 05/20/20  8:57 PM   Specimen: Abscess  Result Value Ref Range Status   Specimen Description ABSCESS RIGHT PLEURAL  Final   Special Requests SYRINGE  Final   Gram Stain   Final    ABUNDANT WBC PRESENT, PREDOMINANTLY PMN FEW GRAM POSITIVE COCCI IN PAIRS IN CLUSTERS Performed at [facility], [address] [place], [address] [place]    Culture   Final    RARE STAPHYLOCOCCUS AUREUS NO ANAEROBES  ISOLATED; CULTURE IN PROGRESS FOR 5 DAYS  Report Status PENDING  Incomplete   Organism ID, Bacteria STAPHYLOCOCCUS AUREUS  Final      Susceptibility   Staphylococcus aureus - MIC*    CIPROFLOXACIN <=0.5 SENSITIVE Sensitive     ERYTHROMYCIN 0.5 SENSITIVE Sensitive     GENTAMICIN <=0.5 SENSITIVE Sensitive     OXACILLIN <=0.25 SENSITIVE Sensitive     TETRACYCLINE <=1 SENSITIVE Sensitive     VANCOMYCIN 1 SENSITIVE Sensitive     TRIMETH/SULFA <=10 SENSITIVE Sensitive     CLINDAMYCIN <=0.25 SENSITIVE Sensitive     RIFAMPIN <=0.5 SENSITIVE Sensitive     Inducible Clindamycin NEGATIVE Sensitive     * RARE STAPHYLOCOCCUS AUREUS    RADIOLOGY STUDIES/RESULTS: DG Chest Port 1 View  Result Date: 05/23/2020 CLINICAL DATA:  Pleural effusion. EXAM: PORTABLE CHEST 1 VIEW COMPARISON:  05/22/2020. FINDINGS: Two right chest tubes in stable position. Stable right base pneumothorax and small right pleural effusion. Persistent bibasilar atelectasis/infiltrates. Heart size stable. Right chest wall subcutaneous emphysema again noted. Degenerative change both shoulders and thoracic spine. IMPRESSION: 1. Two right chest tubes in stable position. Stable right base pneumothorax and small right pleural effusion. Right chest wall subcutaneous emphysema again noted. 2. Persistent bibasilar atelectasis/infiltrates. Electronically Signed   By: Maisie Fushomas  Register   On: 05/23/2020 05:55   DG Chest Port 1 View  Result Date: 05/22/2020 CLINICAL DATA:  Chest tube. EXAM: PORTABLE CHEST 1 VIEW COMPARISON:  05/21/2020. FINDINGS: Two right chest tubes in stable position. Stable right pneumothorax and small right pleural effusion. Persistent bibasilar atelectasis/infiltrates. Heart size normal. Degenerative change thoracic spine. Stable right chest wall subcutaneous emphysema. IMPRESSION: 1. Two right chest tubes in stable position. Stable right pneumothorax and small right pleural effusion. Stable right chest wall subcutaneous  emphysema. 2. Persistent bibasilar atelectasis/infiltrates. Electronically Signed   By: Maisie Fushomas  Register   On: 05/22/2020 06:16     LOS: 3 days   Jeoffrey MassedShanker Luis Sami, MD  [facility]    To contact the attending provider between 7A-7P or the covering provider during after hours 7P-7A, please log into the web site [email or web address] and access using universal [place] password for that web site. If you do not have the password, please call the hospital operator.  05/23/2020, 1:56 PM

## 2020-05-23 NOTE — Progress Notes (Signed)
Occupational Therapy Treatment [name] MRN: [id number] DOB: [birth year] Today's Date: 05/23/2020    History of present illness Pt is a 79 y/o female admitted from [facility] secondary to SOB and cough with hemoptysis. X-rays showed right-sided hydropneumothorax and a CT angiogram was done which shows hydropneumothorax and necrotizing pneumonia on the right side and on the left side with features concerning for previous pneumonia from Covid. Pt is s/p chest tube placement. Pt with recent admission for CVA and COVID. PMH includes HTN and DVT.   OT comments  Patient continues with deficits listed below.  However, she is much brighter, following commands well, and eager to get up and start moving.  Patient needed supervision with bed mobility due to line, leads and chest tubes.  Able to stand with Min A, take 4 steps with RW and Min A to recliner, and time to manage chest tubes.  Patient with minimal setup for self feeding and setup for light grooming task.  DOE noted, but was able to remain off O2 with sats 95%.  OT to continue working with the patient, but SNF for post acute rehab has been recommended.    Follow Up Recommendations  SNF;Supervision/Assistance - 24 hour    Equipment Recommendations  3 in 1 bedside commode;Wheelchair (measurements OT);Wheelchair cushion (measurements OT);Other (comment)    Recommendations for Other Services      Precautions / Restrictions Precautions Precautions: Fall Precaution Comments: Chest tube Restrictions Weight Bearing Restrictions: No       Mobility Bed Mobility Overal bed mobility: Needs Assistance Bed Mobility: Supine to Sit     Supine to sit: Supervision        Transfers Overall transfer level: Needs assistance Equipment used: Rolling walker (2 wheeled) Transfers: Sit to/from [employer] Sit to Stand: Min guard Stand pivot transfers: Min guard            Balance   Sitting-balance support:  No upper extremity supported Sitting balance-Leahy Scale: Fair     Standing balance support: Bilateral upper extremity supported Standing balance-Leahy Scale: Poor                             ADL either performed or assessed with clinical judgement   ADL   Eating/Feeding: Independent;Sitting   Grooming: Wash/dry hands;Wash/dry face;Sitting;Supervision/safety           Upper Body Dressing : Moderate assistance;Sitting                                             Cognition Arousal/Alertness: Awake/alert Behavior During Therapy: WFL for tasks assessed/performed                                   General Comments: Patient seems to be doing better today.  Following commands, and much better.        Exercises     Shoulder Instructions       General Comments Patient found on RA.  O2 sats 95% after transfer.  RR up to 35 and HR to 121 after transfer.  Nursing aware.      Pertinent Vitals/ Pain       Pain Assessment: No/denies pain Pain Intervention(s): Monitored during session  Frequency  Min 2X/week        Progress Toward Goals  OT Goals(current goals can now be found in the care plan section)  Progress towards OT goals: Progressing toward goals  Acute Rehab OT Goals Patient Stated Goal: I want to get up and start moving. OT Goal Formulation: With patient Time For Goal Achievement: 06/05/20 Potential to Achieve Goals: Good  Plan Discharge plan remains appropriate    Co-evaluation                 AM-PAC OT "6 Clicks" Daily Activity     Outcome Measure   Help from another person eating meals?: None Help from another person taking care of personal grooming?: A Little Help from another person toileting, which includes using toliet, bedpan, or urinal?: A Little Help from another person bathing (including washing, rinsing,  drying)?: A Lot Help from another person to put on and taking off regular upper body clothing?: A Little Help from another person to put on and taking off regular lower body clothing?: A Lot 6 Click Score: 17    End of Session Equipment Utilized During Treatment: Rolling walker  OT Visit Diagnosis: Unsteadiness on feet (R26.81);Other abnormalities of gait and mobility (R26.89);Hemiplegia and hemiparesis;Muscle weakness (generalized) (M62.81);Other symptoms and signs involving cognitive function Hemiplegia - Right/Left: Left Hemiplegia - dominant/non-dominant: Non-Dominant Hemiplegia - caused by: Cerebral infarction   Activity Tolerance Patient limited by fatigue   Patient Left in chair;with call bell/phone within reach;with chair alarm set   Nurse Communication Mobility status        Time: 1551-1610 OT Time Calculation (min): 19 min  Charges: OT General Charges $OT Visit: 1 Visit OT Treatments $Self Care/Home Management : 8-22 mins  05/23/2020  Rich, OTR/L  Acute Rehabilitation Services  Office:  [phone]    Suzanna Obey 05/23/2020, 4:22 PM

## 2020-05-23 NOTE — Social Work (Signed)
Patient working with therapy -unable to complete assessment.  Antony Blackbird, MSW, LCSWA Clinical Social Worker

## 2020-05-23 NOTE — Progress Notes (Addendum)
ANTICOAGULATION CONSULT NOTE   Pharmacy Consult for Heparin >> enoxaparin Indication:11/9 BLLE DVT and CVA    No Known Allergies  Vital Signs: Temp: 97.8 F (36.6 C) (12/10 0820) Temp Source: Oral (12/10 0820) BP: 153/72 (12/10 1007) Pulse Rate: 84 (12/10 1007)   Weight: 98 kg Height:  65 inches Heparin Dosing Weight: 79.3 kg  Labs: Recent Labs    05/22/20 0135 05/22/20 0727 05/22/20 1710 05/23/20 0116 05/23/20 1033  HGB 10.9*  --   --   --  9.7*  HCT 35.5*  --   --   --  29.9*  PLT 262  --   --   --  442*  APTT 37* 47* 77* 127*  --   HEPARINUNFRC 0.76*  --   --  0.63 0.30  CREATININE 0.80  --   --   --  0.82    Assessment: 79 yr old female with shortness of breath, recent COVID, 11/9 DVT and CVA on apixaban PTA; apixaban on hold for IR procedure and pharmacy was consulted to dose IV heparin. Of note, patient required placement of two chest tubes on 12/7 and heparin was resumed  HL 0.3 at low end of goal. Chest tubes still in place. No reported bleeding. H/H drop, plt reactive high. Increase rate slightly to keep in goal.   ADDENDUM 14:15: switch to enoxaparin. If continues to be stable plan to restart apixaban in a few days.   Goal of Therapy:  Heparin level 0.3-0.5 units/ml, recent CVA Monitor platelets by anticoagulation protocol: Yes   Plan:  Stop heparin Start enoxaparin 80mg  Q12 hr Monitor for signs/symptoms of bleeding  F/u restart apixaban    , PharmD, BCPS, BCCP Clinical Pharmacist  Please check AMION for all [facility] phone numbers After 10:00 PM, call Main Pharmacy [phone]

## 2020-05-23 NOTE — Progress Notes (Addendum)
[address]       [name] [address]             [phone]         Subjective: Feels fair  Objective: Vital signs in last 24 hours: Temp:  [97.4 F (36.3 C)-98 F (36.7 C)] 97.8 F (36.6 C) (12/10 0820) Pulse Rate:  [88-99] 91 (12/10 0353) Cardiac Rhythm: Normal sinus rhythm (12/10 0700) Resp:  [16-20] 20 (12/10 0820) BP: (109-145)/(55-99) 145/78 (12/10 0820) SpO2:  [97 %-100 %] 100 % (12/10 0829)  Hemodynamic parameters for last 24 hours:    Intake/Output from previous day: 12/09 0701 - 12/10 0700 In: 1562.1 [P.O.:600; I.V.:312.2; IV Piggyback:649.9] Out: 1500 [Urine:1300; Chest Tube:200] Intake/Output this shift: Total I/O In: -  Out: 450 [Urine:450]  General appearance: fatigued and no distress Heart: occas irreg beat Lungs: coarse BS with some wheeze throughout right chest  Lab Results: Recent Labs    05/22/20 0135  WBC 8.6  HGB 10.9*  HCT 35.5*  PLT 262   BMET:  Recent Labs    05/22/20 0135  NA 140  K 3.5  CL 105  CO2 26  GLUCOSE 90  BUN 6*  CREATININE 0.80  CALCIUM 8.2*    PT/INR: No results for input(s): LABPROT, INR in the last 72 hours. ABG    Component Value Date/Time   TCO2 27 04/21/2020 2117   CBG (last 3)  No results for input(s): GLUCAP in the last 72 hours.  Meds Scheduled Meds: . amLODipine  5 mg Oral Daily  . atorvastatin  40 mg Oral Daily  . influenza vaccine adjuvanted  0.5 mL Intramuscular Tomorrow-1000  . ipratropium-albuterol  3 mL Nebulization BID  . mouth rinse  15 mL Mouth Rinse BID  . metoprolol tartrate  50 mg Oral BID  . mometasone-formoterol  2 puff Inhalation BID  . pneumococcal 23 valent vaccine  0.5 mL Intramuscular Tomorrow-1000  . sodium chloride flush  10-40 mL Intracatheter Q12H   Continuous Infusions: . ceFEPime (MAXIPIME) IV Stopped (05/23/20 0249)  . heparin 950 Units/hr (05/23/20 0600)  . vancomycin Stopped (05/23/20 0412)   PRN Meds:.acetaminophen **OR**  acetaminophen, oxyCODONE-acetaminophen, sodium chloride flush  Xrays DG Chest Port 1 View  Result Date: 05/23/2020 CLINICAL DATA:  Pleural effusion. EXAM: PORTABLE CHEST 1 VIEW COMPARISON:  05/22/2020. FINDINGS: Two right chest tubes in stable position. Stable right base pneumothorax and small right pleural effusion. Persistent bibasilar atelectasis/infiltrates. Heart size stable. Right chest wall subcutaneous emphysema again noted. Degenerative change both shoulders and thoracic spine. IMPRESSION: 1. Two right chest tubes in stable position. Stable right base pneumothorax and small right pleural effusion. Right chest wall subcutaneous emphysema again noted. 2. Persistent bibasilar atelectasis/infiltrates. Electronically Signed   By: Maisie Fus  Register   On: 05/23/2020 05:55   DG Chest Port 1 View  Result Date: 05/22/2020 CLINICAL DATA:  Chest tube. EXAM: PORTABLE CHEST 1 VIEW COMPARISON:  05/21/2020. FINDINGS: Two right chest tubes in stable position. Stable right pneumothorax and small right pleural effusion. Persistent bibasilar atelectasis/infiltrates. Heart size normal. Degenerative change thoracic spine. Stable right chest wall subcutaneous emphysema. IMPRESSION: 1. Two right chest tubes in stable position. Stable right pneumothorax and small right pleural effusion. Stable right chest wall subcutaneous emphysema. 2. Persistent bibasilar atelectasis/infiltrates. Electronically Signed   By: Maisie Fus  Register   On: 05/22/2020 06:16    Assessment/Plan:  1 afebrile, VSS, some systolic HTN 2 sats ok on RA 3 chest tubes with 200  cc drainage previous 24 hours, + air leaks in both tubes 4 CXR stable in appearance c/w prev films 5 no changes from surgical perspective, cont medical management as per primary svc  LOS: 3 days    Rowe Clack PA-C Pager [phone] 05/23/2020  Have increased suction on tubes today Follow up chest xray in am I have seen and examined [name] and agree with the  above assessment  and plan.  [name] MD Beeper [phone] Office [phone] 05/23/2020 9:34 AM

## 2020-05-24 ENCOUNTER — Inpatient Hospital Stay (HOSPITAL_COMMUNITY): Payer: Medicare (Managed Care)

## 2020-05-24 LAB — COMPREHENSIVE METABOLIC PANEL
ALT: 16 U/L (ref 0–44)
AST: 18 U/L (ref 15–41)
Albumin: 1.7 g/dL — ABNORMAL LOW (ref 3.5–5.0)
Alkaline Phosphatase: 51 U/L (ref 38–126)
Anion gap: 8 (ref 5–15)
BUN: 6 mg/dL — ABNORMAL LOW (ref 8–23)
CO2: 25 mmol/L (ref 22–32)
Calcium: 8.1 mg/dL — ABNORMAL LOW (ref 8.9–10.3)
Chloride: 108 mmol/L (ref 98–111)
Creatinine, Ser: 0.86 mg/dL (ref 0.44–1.00)
GFR, Estimated: >60 mL/min (ref >60)
Glucose, Bld: 81 mg/dL (ref 70–99)
Potassium: 2.8 mmol/L — ABNORMAL LOW (ref 3.5–5.1)
Sodium: 141 mmol/L (ref 135–145)
Total Bilirubin: 0.6 mg/dL (ref 0.3–1.2)
Total Protein: 5 g/dL — ABNORMAL LOW (ref 6.5–8.1)

## 2020-05-24 LAB — CBC
HCT: 27.9 % — ABNORMAL LOW (ref 36.0–46.0)
Hemoglobin: 9.1 g/dL — ABNORMAL LOW (ref 12.0–15.0)
MCH: 31.6 pg (ref 26.0–34.0)
MCHC: 32.6 g/dL (ref 30.0–36.0)
MCV: 96.9 fL (ref 80.0–100.0)
Platelets: 429 10*3/uL — ABNORMAL HIGH (ref 150–400)
RBC: 2.88 MIL/uL — ABNORMAL LOW (ref 3.87–5.11)
RDW: 16.3 % — ABNORMAL HIGH (ref 11.5–15.5)
WBC: 8.1 10*3/uL (ref 4.0–10.5)
nRBC: 0 % (ref 0.0–0.2)

## 2020-05-24 MED ORDER — POTASSIUM CHLORIDE 10 MEQ/100ML IV SOLN
10.0000 meq | INTRAVENOUS | Status: AC
  Administered 2020-05-24 (×3): 10 meq via INTRAVENOUS
  Filled 2020-05-24 (×3): qty 100

## 2020-05-24 MED ORDER — POTASSIUM CHLORIDE 10 MEQ/100ML IV SOLN
10.0000 meq | Freq: Once | INTRAVENOUS | Status: AC
  Administered 2020-05-24: 21:00:00 10 meq via INTRAVENOUS
  Filled 2020-05-24: qty 100

## 2020-05-24 MED ORDER — MAGNESIUM SULFATE 2 GM/50ML IV SOLN
2.0000 g | Freq: Once | INTRAVENOUS | Status: AC
  Administered 2020-05-24: 13:00:00 2 g via INTRAVENOUS
  Filled 2020-05-24: qty 50

## 2020-05-24 NOTE — [place] FL2 (Signed)
[place] MEDICAID FL2 LEVEL OF CARE SCREENING TOOL     IDENTIFICATION  Patient Name: Beverly Howell Birthdate: 1940-09-07 Sex: female Admission Date (Current Location): 05/19/2020  [facility] and [place] Number:  Producer, television/film/video and Address:  [facility] [place], [address] [place], [place] [address]      Provider Number: [id number]  Attending Physician Name and Address:  Maretta Bees, MD  Relative Name and Phone Number:  Adela Glimpse    Current Level of Care: Hospital Recommended Level of Care: Skilled Nursing Facility Prior Approval Number:    Date Approved/Denied:   PASRR Number: [id number]  Discharge Plan: SNF    Current Diagnoses: Patient Active Problem List   Diagnosis Date Noted  . Necrotizing pneumonia (HCC) 05/20/2020  . History of CVA in adulthood 05/20/2020  . History of DVT of lower extremity 05/20/2020  . Acute CVA (cerebrovascular accident) (HCC) 04/22/2020  . Acute respiratory failure due to COVID-19 (HCC) 04/22/2020  . ARF (acute renal failure) (HCC) 04/22/2020  . Essential hypertension 04/22/2020  . Acute respiratory failure (HCC) 04/21/2020    Orientation RESPIRATION BLADDER Height & Weight     Self,Time,Situation,Place  Normal Incontinent,External catheter Weight: 179 lb 10.8 oz (81.5 kg) Height:  5\' 5"  (165.1 cm)  BEHAVIORAL SYMPTOMS/MOOD NEUROLOGICAL BOWEL NUTRITION STATUS      Continent Diet (See discharge summary)  AMBULATORY STATUS COMMUNICATION OF NEEDS Skin   Limited Assist Verbally Normal                       Personal Care Assistance Level of Assistance  Bathing,Feeding,Dressing Bathing Assistance: Maximum assistance Feeding assistance: Limited assistance Dressing Assistance: Maximum assistance     Functional Limitations Info  Sight,Hearing,Speech Sight Info: Adequate Hearing Info: Adequate Speech Info: Adequate    SPECIAL CARE FACTORS FREQUENCY  PT (By licensed PT),OT (By licensed OT)      PT Frequency: 5x a week OT Frequency: 5x a week            Contractures Contractures Info: Not present    Additional Factors Info  Code Status,Allergies Code Status Info: FULL Allergies Info: NKA           Current Medications (05/24/2020):  This is the current hospital active medication list Current Facility-Administered Medications  Medication Dose Route Frequency Provider Last Rate Last Admin  . acetaminophen (TYLENOL) tablet 650 mg  650 mg Oral Q6H PRN 14/04/2020, MD   650 mg at 05/22/20 1944   Or  . acetaminophen (TYLENOL) suppository 650 mg  650 mg Rectal Q6H PRN 14/09/21, MD      . amLODipine (NORVASC) tablet 5 mg  5 mg Oral Daily Eduard Clos, MD   5 mg at 05/24/20 14/11/21  . atorvastatin (LIPITOR) tablet 40 mg  40 mg Oral Daily [id number], MD   40 mg at 05/24/20 0803  . ceFAZolin (ANCEF) IVPB 2g/100 mL premix  2 g Intravenous Q8H 14/11/21, RPH 200 mL/hr at 05/24/20 0807 2 g at 05/24/20 0807  . enoxaparin (LOVENOX) injection 80 mg  80 mg Subcutaneous Q12H 14/11/21, RPH   80 mg at 05/24/20 0606  . influenza vaccine adjuvanted (FLUAD) injection 0.5 mL  0.5 mL Intramuscular Tomorrow-1000 Ghimire, Shanker M, MD      . ipratropium-albuterol (DUONEB) 0.5-2.5 (3) MG/3ML nebulizer solution 3 mL  3 mL Nebulization BID 14/11/21, MD   3 mL at 05/23/20 1923  .  magnesium sulfate IVPB 2 g 50 mL  2 g Intravenous Once Maretta Bees, MD      . MEDLINE mouth rinse  15 mL Mouth Rinse BID Maretta Bees, MD   15 mL at 05/23/20 1000  . metoprolol tartrate (LOPRESSOR) tablet 50 mg  50 mg Oral BID Eduard Clos, MD   50 mg at 05/24/20 0630  . mometasone-formoterol (DULERA) 100-5 MCG/ACT inhaler 2 puff  2 puff Inhalation BID Elgergawy, Leana Roe, MD   2 puff at 05/23/20 1923  . oxyCODONE-acetaminophen (PERCOCET/ROXICET) 5-325 MG per tablet 1 tablet  1 tablet Oral Q4H PRN Shalhoub, Deno Lunger, MD      . pneumococcal 23  valent vaccine (PNEUMOVAX-23) injection 0.5 mL  0.5 mL Intramuscular Tomorrow-1000 Ghimire, Shanker M, MD      . potassium chloride 10 mEq in 100 mL IVPB  10 mEq Intravenous Q1 Hr x 4 Ghimire, Shanker M, MD      . sodium chloride flush (NS) 0.9 % injection 10-40 mL  10-40 mL Intracatheter Q12H Eduard Clos, MD   10 mL at 05/23/20 2102  . sodium chloride flush (NS) 0.9 % injection 10-40 mL  10-40 mL Intracatheter PRN Eduard Clos, MD         Discharge Medications: Please see discharge summary for a list of discharge medications.  Relevant Imaging Results:  Relevant Lab Results:   Additional Information SSN [id number]  Cleda Clarks, [place]

## 2020-05-24 NOTE — Progress Notes (Signed)
      [address]       [name] [address]             [phone]         Subjective: Feels okay this morning. She has been walking around the room but not in the halls since she is on suction.   Objective: Vital signs in last 24 hours: Temp:  [97.4 F (36.3 C)-98.3 F (36.8 C)] 98 F (36.7 C) (12/11 0737) Pulse Rate:  [63-95] 85 (12/11 0737) Cardiac Rhythm: Normal sinus rhythm (12/10 1900) Resp:  [13-22] 20 (12/11 0737) BP: (133-153)/(64-89) 147/64 (12/11 0737) SpO2:  [95 %-100 %] 96 % (12/11 0737)     Intake/Output from previous day: 12/10 0701 - 12/11 0700 In: 200 [IV Piggyback:200] Out: 1125 [Urine:950; Chest Tube:175] Intake/Output this shift: Total I/O In: -  Out: 340 [Urine:340]  General appearance: alert, cooperative and no distress Heart: regular rate and rhythm, S1, S2 normal, no murmur, click, rub or gallop Lungs: clear to auscultation bilaterally and rhonchi Abdomen: soft, non-tender; bowel sounds normal; no masses,  no organomegaly Extremities: extremities normal, atraumatic, no cyanosis or edema Wound: clean and dry  Lab Results: Recent Labs    05/23/20 1033 05/24/20 0425  WBC 9.6 8.1  HGB 9.7* 9.1*  HCT 29.9* 27.9*  PLT 442* 429*   BMET:  Recent Labs    05/23/20 1033 05/24/20 0425  NA 143 141  K 3.9 2.8*  CL 110 108  CO2 22 25  GLUCOSE 93 81  BUN 7* 6*  CREATININE 0.82 0.86  CALCIUM 8.3* 8.1*    PT/INR: No results for input(s): LABPROT, INR in the last 72 hours. ABG    Component Value Date/Time   TCO2 27 04/21/2020 2117   CBG (last 3)  No results for input(s): GLUCAP in the last 72 hours.  Assessment/Plan:  1. Afebrile, tolerating room air with good oxygen saturation 2. CXR this morning: Stable right base pneumothorax and small right pleural effusion. Stable compared to previous CXR.  3. Keep chest tubes to suction  4. Medical care per primary  Plan: Continue Ancef, keep chest tubes to suction.  Continue ambulation around the room and incentive spirometer.    LOS: 4 days    Sharlene Dory 05/24/2020

## 2020-05-24 NOTE — TOC Initial Note (Signed)
Transition of Care [facility]) - Initial/Assessment Note    Patient Details  Name: Beverly Howell MRN: [id number] Date of Birth: 1941/03/29  [facility] CM/SW Contact:    Varney Baas Phone Number: 05/24/2020, 11:42 AM  Clinical Narrative:                 CSW met wit patient to discuss SNF reccomendation. Patient reported she would like CSW to speak with her daughter Kenney Houseman. CSW spoke with patient's daughter Kenney Houseman. Patient's daughter stated patient is from [facility] and she would like her to return upon discharge. No further questions reported at this time. CSW to continue to follow and assist with discharge planning needs.   Expected Discharge Plan: Skilled Nursing Facility Barriers to Discharge: Continued Medical Work up   Patient Goals and CMS Choice   CMS Medicare.gov Compare Post Acute Care list provided to:: Patient Represenative (must comment) Choice offered to / list presented to : Adult Children  Expected Discharge Plan and Services Expected Discharge Plan: [place] In-house Referral: Clinical Social Work   Post Acute Care Choice: [place] Living arrangements for the past 2 months: [place]                                      Prior Living Arrangements/Services Living arrangements for the past 2 months: [place] Lives with:: Self Patient language and need for interpreter reviewed:: Yes Do you feel safe going back to the place where you live?: Yes      Need for Family Participation in Patient Care: Yes (Comment) Care giver support system in place?: Yes (comment)   Criminal Activity/Legal Involvement Pertinent to Current Situation/Hospitalization: No - Comment as needed  Activities of Daily Living      Permission Sought/Granted Permission sought to share information with : Family Chief Financial Officer Permission granted to share information with : Yes,  Verbal Permission Granted  Share Information with NAME: [place] Neighbor  Permission granted to share info w AGENCY: SNF  Permission granted to share info w Relationship: Daughter  Permission granted to share info w Contact Information: [phone]  Emotional Assessment Appearance:: Appears stated age Attitude/Demeanor/Rapport: Unable to Assess Affect (typically observed): Calm Orientation: : Oriented to Self,Oriented to Place,Oriented to  Time,Oriented to Situation Alcohol / Substance Use: Not Applicable Psych Involvement: No (comment)  Admission diagnosis:  Shortness of breath [R06.02] Necrotizing pneumonia ([place]) [J85.0] Hydropneumothorax [J94.8] Hypoxia [R09.02] Hemoptysis [R04.2] Dyspnea, unspecified type [R06.00] Patient Active Problem List   Diagnosis Date Noted  . Necrotizing pneumonia ([place]) 05/20/2020  . History of CVA in adulthood 05/20/2020  . History of DVT of lower extremity 05/20/2020  . Acute CVA (cerebrovascular accident) ([place]) 04/22/2020  . Acute respiratory failure due to COVID-19 ([place]) 04/22/2020  . ARF (acute renal failure) ([place]) 04/22/2020  . Essential hypertension 04/22/2020  . Acute respiratory failure ([place]) 04/21/2020   PCP:  System, Provider Not In Pharmacy:   [facility], [place] [address] Phone: [phone] Fax: [phone]     Social Determinants of Health (SDOH) Interventions    Readmission Risk Interventions No flowsheet data found.

## 2020-05-24 NOTE — Progress Notes (Addendum)
PROGRESS NOTE        [name] Age: 79 y.o. Sex: female Date of Birth: 12/15/1940 Admit Date: 05/19/2020 Admitting Physician Eduard Clos, MD BWL:SLHTDS, Provider Not In  Brief Narrative: Patient is a 79 y.o. female with history of recent severe COVID-19 infection-along with CVA/lower extremity DVT-presenting to the hospital with shortness of breath-found to have necrotizing right-sided pneumonia with loculated hydropneumothorax.  See below for further details.  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  Multifocal pneumonia.  Antimicrobial therapy: Ancef:12/10>> Vancomycin: 12/6>>12/10 Cefepime: 12/6>>12/10 Zithromax: 12/6>> 12/8  Microbiology data: 12/7>> blood cultures: No growth 12/7>> right pleural fluid culture: MSSA  Procedures : 12/7>> CT-guided chest tube placement x2 by interventional radiology  Consults: CT surgery, IR  DVT Prophylaxis : IV heparin>>SQ therapeutic Lovenox   Subjective: Lying comfortably in bed-denies any chest pain or shortness of breath.  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  Await further input from CT surgery.  Hypokalemia: Replete and recheck  History of recent right MCA CVA with left-sided weakness: Thought to be provoked due to  COVID-19 hypercoagulable state-minimal left-sided weakness persists.  Remains on therapeutic Lovenox-once further stable-not felt to require any further procedures-we can transition to oral anticoagulation.    Recent history of bilateral lower extremity DVT: Thought to be provoked due to COVID-19 hypercoagulable state-currently on therapeutic anticoagulation  HTN: BP controlled-continue metoprolol and amlodipine.  Recent COVID-19 infection with severe hypoxemia: Does not require any further isolation as > 3 weeks out from her initial presentation.   Diet: Diet Order            Diet Heart Room service appropriate? Yes; Fluid consistency: Thin  Diet effective now                  Code Status: Full code   Family Communication: Daughter ([phone])-on 12/10-we will update on 12/12.  Disposition Plan: Status is: Inpatient  Remains inpatient appropriate because:Inpatient level of care appropriate due to severity of illness   Dispo: The patient is from: Home              Anticipated d/c is to: TBD              Anticipated d/c date is: > 3 days              Patient currently is not medically stable to d/c.   Barriers to Discharge: MSSA pneumonia with empyema-on IV antibiotics-chest tube in place  Antimicrobial agents: Anti-infectives (From admission, onward)   Start     Dose/Rate Route Frequency Ordered Stop   05/23/20 1600  ceFAZolin (ANCEF) IVPB 2g/100 mL premix        2 g 200 mL/hr over 30 Minutes Intravenous Every 8 hours 05/23/20 1414     05/23/20 0300  vancomycin (VANCOREADY) IVPB 750 mg/150 mL  Status:  Discontinued  750 mg 150 mL/hr over 60 Minutes Intravenous Every 12 hours 05/22/20 1149 05/23/20 1356   05/23/20 0100  vancomycin (VANCOREADY) IVPB 750 mg/150 mL  Status:  Discontinued        750 mg 150 mL/hr over 60 Minutes Intravenous Every 12 hours 05/22/20 1146 05/22/20 1149   05/20/20 2200  vancomycin (VANCOCIN) IVPB 1000 mg/200 mL premix  Status:   Discontinued        1,000 mg 200 mL/hr over 60 Minutes Intravenous Every 12 hours 05/20/20 1401 05/22/20 1146   05/20/20 1000  ceFEPIme (MAXIPIME) 2 g in sodium chloride 0.9 % 100 mL IVPB  Status:  Discontinued        2 g 200 mL/hr over 30 Minutes Intravenous Every 8 hours 05/20/20 0226 05/23/20 1356   05/20/20 1000  vancomycin (VANCOREADY) IVPB 500 mg/100 mL  Status:  Discontinued        500 mg 100 mL/hr over 60 Minutes Intravenous Every 12 hours 05/20/20 0240 05/20/20 1401   05/20/20 0230  azithromycin (ZITHROMAX) 500 mg in sodium chloride 0.9 % 250 mL IVPB  Status:  Discontinued        500 mg 250 mL/hr over 60 Minutes Intravenous Daily at bedtime 05/20/20 0226 05/21/20 1443   05/20/20 0100  vancomycin (VANCOCIN) IVPB 1000 mg/200 mL premix        1,000 mg 200 mL/hr over 60 Minutes Intravenous  Once 05/20/20 0046 05/20/20 0402   05/20/20 0045  ceFEPIme (MAXIPIME) 2 g in sodium chloride 0.9 % 100 mL IVPB        2 g 200 mL/hr over 30 Minutes Intravenous  Once 05/20/20 0030 05/20/20 0221       Time spent: 25-minutes-Greater than 50% of this time was spent in counseling, explanation of diagnosis, planning of further management, and coordination of care.  MEDICATIONS: Scheduled Meds: . amLODipine  5 mg Oral Daily  . atorvastatin  40 mg Oral Daily  . enoxaparin (LOVENOX) injection  80 mg Subcutaneous Q12H  . influenza vaccine adjuvanted  0.5 mL Intramuscular Tomorrow-1000  . ipratropium-albuterol  3 mL Nebulization BID  . mouth rinse  15 mL Mouth Rinse BID  . metoprolol tartrate  50 mg Oral BID  . mometasone-formoterol  2 puff Inhalation BID  . pneumococcal 23 valent vaccine  0.5 mL Intramuscular Tomorrow-1000  . sodium chloride flush  10-40 mL Intracatheter Q12H   Continuous Infusions: .  ceFAZolin (ANCEF) IV 2 g (05/24/20 0807)   PRN Meds:.acetaminophen **OR** acetaminophen, oxyCODONE-acetaminophen, sodium chloride flush   PHYSICAL EXAM: Vital signs: Vitals:   05/24/20  0011 05/24/20 0427 05/24/20 0737 05/24/20 0922  BP: 139/68 137/73 (!) 147/64 132/71  Pulse: 66 79 85   Resp: 20 (!) 22 20   Temp: 97.6 F (36.4 C) 98 F (36.7 C) 98 F (36.7 C)   TempSrc: Oral Oral Oral   SpO2: 95% 97% 96%   Weight:      Height:       Filed Weights   05/20/20 1330 05/21/20 0028  Weight: 98 kg 81.5 kg   Body mass index is 29.9 kg/m.   Gen Exam:Alert awake-not in any distress HEENT:atraumatic, normocephalic Chest: B/L clear to auscultation anteriorly CVS:S1S2 regular Abdomen:soft non tender, non distended Extremities:no edema Neurology: Non focal Skin: no rash  I have personally reviewed following labs and imaging studies  LABORATORY DATA: CBC: Recent Labs  Lab 05/19/20 2129 05/22/20 0135 05/23/20 1033 05/24/20 0425  WBC 18.5* 8.6 9.6 8.1  NEUTROABS 14.3*  --   --   --  HGB 12.3 10.9* 9.7* 9.1*  HCT 40.2 35.5* 29.9* 27.9*  MCV 101.3* 99.2 96.5 96.9  PLT 681* 262 442* 429*    Basic Metabolic Panel: Recent Labs  Lab 05/19/20 2129 05/22/20 0135 05/23/20 1033 05/24/20 0425  NA 143 140 143 141  K 3.7 3.5 3.9 2.8*  CL 104 105 110 108  CO2 27 26 22 25   GLUCOSE 99 90 93 81  BUN 13 6* 7* 6*  CREATININE 0.86 0.80 0.82 0.86  CALCIUM 9.2 8.2* 8.3* 8.1*    GFR: Estimated Creatinine Clearance: 56.9 mL/min (by C-G formula based on SCr of 0.86 mg/dL).  Liver Function Tests: Recent Labs  Lab 05/19/20 2129 05/22/20 0135 05/23/20 1033 05/24/20 0425  AST 26 16 18 18   ALT 29 17 15 16   ALKPHOS 82 59 55 51  BILITOT 0.7 0.7 0.8 0.6  PROT 6.7 5.6* 5.3* 5.0*  ALBUMIN 2.5* 1.9* 1.8* 1.7*   No results for input(s): LIPASE, AMYLASE in the last 168 hours. No results for input(s): AMMONIA in the last 168 hours.  Coagulation Profile: No results for input(s): INR, PROTIME in the last 168 hours.  Cardiac Enzymes: No results for input(s): CKTOTAL, CKMB, CKMBINDEX, TROPONINI in the last 168 hours.  BNP (last 3 results) No results for input(s):  PROBNP in the last 8760 hours.  Lipid Profile: No results for input(s): CHOL, HDL, LDLCALC, TRIG, CHOLHDL, LDLDIRECT in the last 72 hours.  Thyroid Function Tests: No results for input(s): TSH, T4TOTAL, FREET4, T3FREE, THYROIDAB in the last 72 hours.  Anemia Panel: No results for input(s): VITAMINB12, FOLATE, FERRITIN, TIBC, IRON, RETICCTPCT in the last 72 hours.  Urine analysis:    Component Value Date/Time   COLORURINE AMBER (A) 04/22/2020 1205   APPEARANCEUR HAZY (A) 04/22/2020 1205   LABSPEC 1.026 04/22/2020 1205   PHURINE 5.0 04/22/2020 1205   GLUCOSEU NEGATIVE 04/22/2020 1205   HGBUR NEGATIVE 04/22/2020 1205   BILIRUBINUR NEGATIVE 04/22/2020 1205   KETONESUR 5 (A) 04/22/2020 1205   PROTEINUR 30 (A) 04/22/2020 1205   UROBILINOGEN 0.2 02/03/2014 2029   NITRITE NEGATIVE 04/22/2020 1205   LEUKOCYTESUR NEGATIVE 04/22/2020 1205    Sepsis Labs: Lactic Acid, Venous    Component Value Date/Time   LATICACIDVEN 1.6 05/19/2020 2309    MICROBIOLOGY: Recent Results (from the past 240 hour(s))  Blood culture (routine x 2)     Status: None (Preliminary result)   Collection Time: 05/20/20  1:14 AM   Specimen: BLOOD  Result Value Ref Range Status   Specimen Description BLOOD SITE NOT SPECIFIED  Final   Special Requests   Final    BOTTLES DRAWN AEROBIC AND ANAEROBIC Blood Culture adequate volume   Culture   Final    NO GROWTH 3 DAYS Performed at [facility], [address] [place], [place] [address]    Report Status PENDING  Incomplete  Blood culture (routine x 2)     Status: None (Preliminary result)   Collection Time: 05/20/20  3:55 AM   Specimen: BLOOD  Result Value Ref Range Status   Specimen Description BLOOD A-LINE  Final   Special Requests   Final    BOTTLES DRAWN AEROBIC AND ANAEROBIC Blood Culture adequate volume   Culture   Final    NO GROWTH 3 DAYS Performed at [facility], [address] [place], [place] [address]    Report Status PENDING   Incomplete  Aerobic/Anaerobic Culture (surgical/deep wound)     Status: None (Preliminary result)  Collection Time: 05/20/20  8:57 PM   Specimen: Abscess  Result Value Ref Range Status   Specimen Description ABSCESS RIGHT PLEURAL  Final   Special Requests SYRINGE  Final   Gram Stain   Final    ABUNDANT WBC PRESENT, PREDOMINANTLY PMN FEW GRAM POSITIVE COCCI IN PAIRS IN CLUSTERS Performed at [facility], [address], [place], [place] [address]    Culture   Final    RARE STAPHYLOCOCCUS AUREUS NO ANAEROBES ISOLATED; CULTURE IN PROGRESS FOR 5 DAYS    Report Status PENDING  Incomplete   Organism ID, Bacteria STAPHYLOCOCCUS AUREUS  Final      Susceptibility   Staphylococcus aureus - MIC*    CIPROFLOXACIN <=0.5 SENSITIVE Sensitive     ERYTHROMYCIN 0.5 SENSITIVE Sensitive     GENTAMICIN <=0.5 SENSITIVE Sensitive     OXACILLIN <=0.25 SENSITIVE Sensitive     TETRACYCLINE <=1 SENSITIVE Sensitive     VANCOMYCIN 1 SENSITIVE Sensitive     TRIMETH/SULFA <=10 SENSITIVE Sensitive     CLINDAMYCIN <=0.25 SENSITIVE Sensitive     RIFAMPIN <=0.5 SENSITIVE Sensitive     Inducible Clindamycin NEGATIVE Sensitive     * RARE STAPHYLOCOCCUS AUREUS    RADIOLOGY STUDIES/RESULTS: DG Chest 1 View  Result Date: 05/24/2020 CLINICAL DATA:  Pneumothorax. EXAM: CHEST  1 VIEW COMPARISON:  May 23, 2020. FINDINGS: Stable cardiomediastinal silhouette. Stable position of 2 right-sided chest tubes. Stable mild pneumothorax is noted laterally. Stable right basilar atelectasis is noted with probable pleural effusion. Pleural thickening is noted. Stable left basilar atelectasis or infiltrates are noted. Bony thorax is unremarkable. IMPRESSION: Stable position of 2 right-sided chest tubes. Stable mild pneumothorax is noted laterally. Stable right basilar atelectasis is noted with probable pleural effusion. Stable left basilar atelectasis or infiltrates are noted. Electronically Signed   By: Lupita Raider M.D.    On: 05/24/2020 08:46   DG Chest Port 1 View  Result Date: 05/23/2020 CLINICAL DATA:  Pleural effusion. EXAM: PORTABLE CHEST 1 VIEW COMPARISON:  05/22/2020. FINDINGS: Two right chest tubes in stable position. Stable right base pneumothorax and small right pleural effusion. Persistent bibasilar atelectasis/infiltrates. Heart size stable. Right chest wall subcutaneous emphysema again noted. Degenerative change both shoulders and thoracic spine. IMPRESSION: 1. Two right chest tubes in stable position. Stable right base pneumothorax and small right pleural effusion. Right chest wall subcutaneous emphysema again noted. 2. Persistent bibasilar atelectasis/infiltrates. Electronically Signed   By: Maisie Fus  Register   On: 05/23/2020 05:55     LOS: 4 days   Jeoffrey Massed, MD  [facility]    To contact the attending provider between 7A-7P or the covering provider during after hours 7P-7A, please log into the web site [email or web address] and access using universal [place] password for that web site. If you do not have the password, please call the hospital operator.  05/24/2020, 9:59 AM

## 2020-05-25 LAB — COMPREHENSIVE METABOLIC PANEL
ALT: 14 U/L (ref 0–44)
AST: 19 U/L (ref 15–41)
Albumin: 1.9 g/dL — ABNORMAL LOW (ref 3.5–5.0)
Alkaline Phosphatase: 66 U/L (ref 38–126)
Anion gap: 9 (ref 5–15)
BUN: 7 mg/dL — ABNORMAL LOW (ref 8–23)
CO2: 24 mmol/L (ref 22–32)
Calcium: 8.4 mg/dL — ABNORMAL LOW (ref 8.9–10.3)
Chloride: 107 mmol/L (ref 98–111)
Creatinine, Ser: 0.87 mg/dL (ref 0.44–1.00)
GFR, Estimated: >60 mL/min (ref >60)
Glucose, Bld: 97 mg/dL (ref 70–99)
Potassium: 3.6 mmol/L (ref 3.5–5.1)
Sodium: 140 mmol/L (ref 135–145)
Total Bilirubin: 0.4 mg/dL (ref 0.3–1.2)
Total Protein: 5.7 g/dL — ABNORMAL LOW (ref 6.5–8.1)

## 2020-05-25 LAB — MAGNESIUM: Magnesium: 2.1 mg/dL (ref 1.7–2.4)

## 2020-05-25 LAB — CULTURE, BLOOD (ROUTINE X 2)
Culture: NO GROWTH
Culture: NO GROWTH
Special Requests: ADEQUATE
Special Requests: ADEQUATE

## 2020-05-25 LAB — CBC
HCT: 33.2 % — ABNORMAL LOW (ref 36.0–46.0)
Hemoglobin: 10.2 g/dL — ABNORMAL LOW (ref 12.0–15.0)
MCH: 30.1 pg (ref 26.0–34.0)
MCHC: 30.7 g/dL (ref 30.0–36.0)
MCV: 97.9 fL (ref 80.0–100.0)
Platelets: 432 10*3/uL — ABNORMAL HIGH (ref 150–400)
RBC: 3.39 MIL/uL — ABNORMAL LOW (ref 3.87–5.11)
RDW: 16.6 % — ABNORMAL HIGH (ref 11.5–15.5)
WBC: 9.2 10*3/uL (ref 4.0–10.5)
nRBC: 0 % (ref 0.0–0.2)

## 2020-05-25 LAB — AEROBIC/ANAEROBIC CULTURE W GRAM STAIN (SURGICAL/DEEP WOUND)

## 2020-05-25 MED ORDER — POTASSIUM CHLORIDE CRYS ER 20 MEQ PO TBCR
40.0000 meq | EXTENDED_RELEASE_TABLET | Freq: Once | ORAL | Status: AC
  Administered 2020-05-25: 10:00:00 40 meq via ORAL
  Filled 2020-05-25: qty 2

## 2020-05-25 NOTE — Progress Notes (Signed)
Physical Therapy Treatment [name] MRN: [id number] DOB: 1941-03-06 Today's Date: 05/25/2020    History of Present Illness Pt is a 79 y/o female admitted from [facility] secondary to SOB and cough with hemoptysis. X-rays showed right-sided hydropneumothorax and a CT angiogram was done which shows hydropneumothorax and necrotizing pneumonia on the right side and on the left side with features concerning for previous pneumonia from Covid. Pt is s/p chest tube placement. Pt with recent admission for CVA and COVID. PMH includes HTN and DVT.    PT Comments    Pt progressing with mobility. In order to keep chest tubes to suction worked on standing and stepping in place. Continue to recommend return to SNF for further rehab.    Follow Up Recommendations  SNF;Supervision/Assistance - 24 hour     Equipment Recommendations  Wheelchair (measurements PT);Wheelchair cushion (measurements PT)    Recommendations for Other Services       Precautions / Restrictions Precautions Precautions: Fall Precaution Comments: Chest tube    Mobility  Bed Mobility               General bed mobility comments: Pt up in chair  Transfers Overall transfer level: Needs assistance Equipment used: 4-wheeled walker Transfers: Sit to/from Stand Sit to Stand: Min assist         General transfer comment: Assist to bring hips up and for balance  Ambulation/Gait Ambulation/Gait assistance: Min assist Gait Distance (Feet):  (Pt did stepping in place 4x for ~30 sec with rollator. Pt on suction for chest tubes so didn't amb more.) Assistive device: 4-wheeled walker Gait Pattern/deviations: Step-to pattern;Decreased step length - right;Decreased step length - left Gait velocity: decr Gait velocity interpretation: 1.31 - 2.62 ft/sec, indicative of limited community ambulator General Gait Details: Assist for balance for stepping in place 4x 30 sec   Stairs             Wheelchair  Mobility    Modified Rankin (Stroke Patients Only)       Balance Overall balance assessment: Needs assistance Sitting-balance support: No upper extremity supported;Feet supported Sitting balance-Leahy Scale: Good     Standing balance support: Bilateral upper extremity supported Standing balance-Leahy Scale: Poor Standing balance comment: rollator and min guard for static standing                            Cognition Arousal/Alertness: Awake/alert Behavior During Therapy: WFL for tasks assessed/performed Overall Cognitive Status: Within Functional Limits for tasks assessed                                        Exercises General Exercises - Lower Extremity Ankle Circles/Pumps: AROM;Both;10 reps;Seated Hip Flexion/Marching: AROM;5 reps;Both;Seated    General Comments General comments (skin integrity, edema, etc.): HR to 130's with activity. Dyspnea 3/4.      Pertinent Vitals/Pain Pain Assessment: No/denies pain    Home Living                      Prior Function            PT Goals (current goals can now be found in the care plan section) Acute Rehab PT Goals Patient Stated Goal: get stronger Progress towards PT goals: Progressing toward goals    Frequency    Min 2X/week  PT Plan Current plan remains appropriate    Co-evaluation              AM-PAC PT "6 Clicks" Mobility   Outcome Measure  Help needed turning from your back to your side while in a flat bed without using bedrails?: A Little Help needed moving from lying on your back to sitting on the side of a flat bed without using bedrails?: A Little Help needed moving to and from a bed to a chair (including a wheelchair)?: A Little Help needed standing up from a chair using your arms (e.g., wheelchair or bedside chair)?: A Little Help needed to walk in hospital room?: A Little Help needed climbing 3-5 steps with a railing? : Total 6 Click Score: 16     End of Session   Activity Tolerance: Patient tolerated treatment well Patient left: with call bell/phone within reach;in chair;with chair alarm set Nurse Communication: Mobility status PT Visit Diagnosis: Muscle weakness (generalized) (M62.81);Unsteadiness on feet (R26.81)     Time: 9675-9163 PT Time Calculation (min) (ACUTE ONLY): 10 min  Charges:  $Gait Training: 8-22 mins                     [facility] PT Acute Rehabilitation Services Pager [phone] Office [phone]    [name] [facility] 05/25/2020, 2:58 PM

## 2020-05-25 NOTE — Progress Notes (Signed)
PROGRESS NOTE        [name] Age: 79 y.o. Sex: female Date of Birth: 1941-06-07 Admit Date: 05/19/2020 Admitting Physician Eduard Clos, MD IOE:[id number], Provider Not In  Brief Narrative: Patient is a 79 y.o. female with history of recent severe COVID-19 infection-along with CVA/lower extremity DVT-presenting to the hospital with shortness of breath-found to have necrotizing right-sided pneumonia with loculated hydropneumothorax.  See below for further details.  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  Multifocal pneumonia.  Antimicrobial therapy: Ancef:12/10>> Vancomycin: 12/6>>12/10 Cefepime: 12/6>>12/10 Zithromax: 12/6>> 12/8  Microbiology data: 12/7>> blood cultures: No growth 12/7>> right pleural fluid culture: MSSA  Procedures : 12/7>> CT-guided chest tube placement x2 by interventional radiology  Consults: CT surgery, IR  DVT Prophylaxis : IV heparin>>SQ therapeutic Lovenox   Subjective: Lying comfortably in bed-denies any chest pain or shortness of breath.  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  Await further input from CT surgery.  Hypokalemia: Repleted  History of recent right MCA CVA with left-sided weakness: Thought to be provoked due to COVID-19  hypercoagulable state-minimal left-sided weakness persists.  Remains on therapeutic Lovenox-once further stable-not felt to require any further procedures-we can transition to oral anticoagulation.    Recent history of bilateral lower extremity DVT: Thought to be provoked due to COVID-19 hypercoagulable state-currently on therapeutic anticoagulation  HTN: BP controlled-continue metoprolol and amlodipine.  Recent COVID-19 infection with severe hypoxemia: Does not require any further isolation as > 3 weeks out from her initial presentation.   Diet: Diet Order            Diet Heart Room service appropriate? Yes; Fluid consistency: Thin  Diet effective now                  Code Status: Full code   Family Communication: Daughter (Tonya-[phone])-on 12/10-we will update on 12/14  Disposition Plan: Status is: Inpatient  Remains inpatient appropriate because:Inpatient level of care appropriate due to severity of illness   Dispo: The patient is from: Home              Anticipated d/c is to: TBD              Anticipated d/c date is: > 3 days              Patient currently is not medically stable to d/c.   Barriers to Discharge: MSSA pneumonia with empyema-on IV antibiotics-chest tube in place  Antimicrobial agents: Anti-infectives (From admission, onward)   Start     Dose/Rate Route Frequency Ordered Stop   05/23/20 1600  ceFAZolin (ANCEF) IVPB 2g/100 mL premix        2 g 200 mL/hr over 30 Minutes Intravenous Every 8 hours 05/23/20 1414     05/23/20 0300  vancomycin (VANCOREADY) IVPB 750 mg/150 mL  Status:  Discontinued  750 mg 150 mL/hr over 60 Minutes Intravenous Every 12 hours 05/22/20 1149 05/23/20 1356   05/23/20 0100  vancomycin (VANCOREADY) IVPB 750 mg/150 mL  Status:  Discontinued        750 mg 150 mL/hr over 60 Minutes Intravenous Every 12 hours 05/22/20 1146 05/22/20 1149   05/20/20 2200  vancomycin (VANCOCIN) IVPB 1000 mg/200 mL premix  Status:   Discontinued        1,000 mg 200 mL/hr over 60 Minutes Intravenous Every 12 hours 05/20/20 1401 05/22/20 1146   05/20/20 1000  ceFEPIme (MAXIPIME) 2 g in sodium chloride 0.9 % 100 mL IVPB  Status:  Discontinued        2 g 200 mL/hr over 30 Minutes Intravenous Every 8 hours 05/20/20 0226 05/23/20 1356   05/20/20 1000  vancomycin (VANCOREADY) IVPB 500 mg/100 mL  Status:  Discontinued        500 mg 100 mL/hr over 60 Minutes Intravenous Every 12 hours 05/20/20 0240 05/20/20 1401   05/20/20 0230  azithromycin (ZITHROMAX) 500 mg in sodium chloride 0.9 % 250 mL IVPB  Status:  Discontinued        500 mg 250 mL/hr over 60 Minutes Intravenous Daily at bedtime 05/20/20 0226 05/21/20 1443   05/20/20 0100  vancomycin (VANCOCIN) IVPB 1000 mg/200 mL premix        1,000 mg 200 mL/hr over 60 Minutes Intravenous  Once 05/20/20 0046 05/20/20 0402   05/20/20 0045  ceFEPIme (MAXIPIME) 2 g in sodium chloride 0.9 % 100 mL IVPB        2 g 200 mL/hr over 30 Minutes Intravenous  Once 05/20/20 0030 05/20/20 0221       Time spent: 25-minutes-Greater than 50% of this time was spent in counseling, explanation of diagnosis, planning of further management, and coordination of care.  MEDICATIONS: Scheduled Meds: . amLODipine  5 mg Oral Daily  . atorvastatin  40 mg Oral Daily  . enoxaparin (LOVENOX) injection  80 mg Subcutaneous Q12H  . influenza vaccine adjuvanted  0.5 mL Intramuscular Tomorrow-1000  . ipratropium-albuterol  3 mL Nebulization BID  . mouth rinse  15 mL Mouth Rinse BID  . metoprolol tartrate  50 mg Oral BID  . mometasone-formoterol  2 puff Inhalation BID  . pneumococcal 23 valent vaccine  0.5 mL Intramuscular Tomorrow-1000  . sodium chloride flush  10-40 mL Intracatheter Q12H   Continuous Infusions: .  ceFAZolin (ANCEF) IV 2 g (05/25/20 1021)   PRN Meds:.acetaminophen **OR** acetaminophen, oxyCODONE-acetaminophen, sodium chloride flush   PHYSICAL EXAM: Vital signs: Vitals:   05/25/20  0801 05/25/20 0835 05/25/20 1022 05/25/20 1143  BP:  (!) 154/68 (!) 156/79 (!) 147/71  Pulse:  81 79 75  Resp:  16 20 20   Temp:  97.6 F (36.4 C)  97.7 F (36.5 C)  TempSrc:  Oral  Oral  SpO2: 99% 93% 94% 94%  Weight:      Height:       Filed Weights   05/20/20 1330 05/21/20 0028  Weight: 98 kg 81.5 kg   Body mass index is 29.9 kg/m.   Gen Exam:Alert awake-not in any distress HEENT:atraumatic, normocephalic Chest: B/L clear to auscultation anteriorly CVS:S1S2 regular Abdomen:soft non tender, non distended Extremities:no edema Neurology: Non focal Skin: no rash  I have personally reviewed following labs and imaging studies  LABORATORY DATA: CBC: Recent Labs  Lab 05/19/20 2129 05/22/20 0135 05/23/20 1033 05/24/20 0425 05/25/20 0213  WBC 18.5* 8.6 9.6 8.1 9.2  NEUTROABS 14.3*  --   --   --   --  HGB 12.3 10.9* 9.7* 9.1* 10.2*  HCT 40.2 35.5* 29.9* 27.9* 33.2*  MCV 101.3* 99.2 96.5 96.9 97.9  PLT 681* 262 442* 429* 432*    Basic Metabolic Panel: Recent Labs  Lab 05/19/20 2129 05/22/20 0135 05/23/20 1033 05/24/20 0425 05/25/20 0213  NA 143 140 143 141 140  K 3.7 3.5 3.9 2.8* 3.6  CL 104 105 110 108 107  CO2 27 26 22 25 24   GLUCOSE 99 90 93 81 97  BUN 13 6* 7* 6* 7*  CREATININE 0.86 0.80 0.82 0.86 0.87  CALCIUM 9.2 8.2* 8.3* 8.1* 8.4*  MG  --   --   --   --  2.1    GFR: Estimated Creatinine Clearance: 56.2 mL/min (by C-G formula based on SCr of 0.87 mg/dL).  Liver Function Tests: Recent Labs  Lab 05/19/20 2129 05/22/20 0135 05/23/20 1033 05/24/20 0425 05/25/20 0213  AST 26 16 18 18 19   ALT 29 17 15 16 14   ALKPHOS 82 59 55 51 66  BILITOT 0.7 0.7 0.8 0.6 0.4  PROT 6.7 5.6* 5.3* 5.0* 5.7*  ALBUMIN 2.5* 1.9* 1.8* 1.7* 1.9*   No results for input(s): LIPASE, AMYLASE in the last 168 hours. No results for input(s): AMMONIA in the last 168 hours.  Coagulation Profile: No results for input(s): INR, PROTIME in the last 168 hours.  Cardiac  Enzymes: No results for input(s): CKTOTAL, CKMB, CKMBINDEX, TROPONINI in the last 168 hours.  BNP (last 3 results) No results for input(s): PROBNP in the last 8760 hours.  Lipid Profile: No results for input(s): CHOL, HDL, LDLCALC, TRIG, CHOLHDL, LDLDIRECT in the last 72 hours.  Thyroid Function Tests: No results for input(s): TSH, T4TOTAL, FREET4, T3FREE, THYROIDAB in the last 72 hours.  Anemia Panel: No results for input(s): VITAMINB12, FOLATE, FERRITIN, TIBC, IRON, RETICCTPCT in the last 72 hours.  Urine analysis:    Component Value Date/Time   COLORURINE AMBER (A) 04/22/2020 1205   APPEARANCEUR HAZY (A) 04/22/2020 1205   LABSPEC 1.026 04/22/2020 1205   PHURINE 5.0 04/22/2020 1205   GLUCOSEU NEGATIVE 04/22/2020 1205   HGBUR NEGATIVE 04/22/2020 1205   BILIRUBINUR NEGATIVE 04/22/2020 1205   KETONESUR 5 (A) 04/22/2020 1205   PROTEINUR 30 (A) 04/22/2020 1205   UROBILINOGEN 0.2 02/03/2014 2029   NITRITE NEGATIVE 04/22/2020 1205   LEUKOCYTESUR NEGATIVE 04/22/2020 1205    Sepsis Labs: Lactic Acid, Venous    Component Value Date/Time   LATICACIDVEN 1.6 05/19/2020 2309    MICROBIOLOGY: Recent Results (from the past 240 hour(s))  Blood culture (routine x 2)     Status: None   Collection Time: 05/20/20  1:14 AM   Specimen: BLOOD  Result Value Ref Range Status   Specimen Description BLOOD SITE NOT SPECIFIED  Final   Special Requests   Final    BOTTLES DRAWN AEROBIC AND ANAEROBIC Blood Culture adequate volume   Culture   Final    NO GROWTH 5 DAYS Performed at [facility], [address], [place], [facility] [address]    Report Status 05/25/2020 FINAL  Final  Blood culture (routine x 2)     Status: None   Collection Time: 05/20/20  3:55 AM   Specimen: BLOOD  Result Value Ref Range Status   Specimen Description BLOOD A-LINE  Final   Special Requests   Final    BOTTLES DRAWN AEROBIC AND ANAEROBIC Blood Culture adequate volume   Culture   Final    NO GROWTH 5  DAYS  Performed at [facility], [address] [place], [place] [address]    Report Status 05/25/2020 FINAL  Final  Aerobic/Anaerobic Culture (surgical/deep wound)     Status: None   Collection Time: 05/20/20  8:57 PM   Specimen: Abscess  Result Value Ref Range Status   Specimen Description ABSCESS RIGHT PLEURAL  Final   Special Requests SYRINGE  Final   Gram Stain   Final    ABUNDANT WBC PRESENT, PREDOMINANTLY PMN FEW GRAM POSITIVE COCCI IN PAIRS IN CLUSTERS    Culture   Final    RARE STAPHYLOCOCCUS AUREUS NO ANAEROBES ISOLATED Performed at [facility], [address], [place], [place] [address]    Report Status 05/25/2020 FINAL  Final   Organism ID, Bacteria STAPHYLOCOCCUS AUREUS  Final      Susceptibility   Staphylococcus aureus - MIC*    CIPROFLOXACIN <=0.5 SENSITIVE Sensitive     ERYTHROMYCIN 0.5 SENSITIVE Sensitive     GENTAMICIN <=0.5 SENSITIVE Sensitive     OXACILLIN <=0.25 SENSITIVE Sensitive     TETRACYCLINE <=1 SENSITIVE Sensitive     VANCOMYCIN 1 SENSITIVE Sensitive     TRIMETH/SULFA <=10 SENSITIVE Sensitive     CLINDAMYCIN <=0.25 SENSITIVE Sensitive     RIFAMPIN <=0.5 SENSITIVE Sensitive     Inducible Clindamycin NEGATIVE Sensitive     * RARE STAPHYLOCOCCUS AUREUS    RADIOLOGY STUDIES/RESULTS: DG Chest 1 View  Result Date: 05/24/2020 CLINICAL DATA:  Pneumothorax. EXAM: CHEST  1 VIEW COMPARISON:  May 23, 2020. FINDINGS: Stable cardiomediastinal silhouette. Stable position of 2 right-sided chest tubes. Stable mild pneumothorax is noted laterally. Stable right basilar atelectasis is noted with probable pleural effusion. Pleural thickening is noted. Stable left basilar atelectasis or infiltrates are noted. Bony thorax is unremarkable. IMPRESSION: Stable position of 2 right-sided chest tubes. Stable mild pneumothorax is noted laterally. Stable right basilar atelectasis is noted with probable pleural effusion. Stable left basilar atelectasis or  infiltrates are noted. Electronically Signed   By: Lupita Raider M.D.   On: 05/24/2020 08:46     LOS: 5 days   Jeoffrey Massed, MD  [facility]    To contact the attending provider between 7A-7P or the covering provider during after hours 7P-7A, please log into the web site [email or web address] and access using universal [place] password for that web site. If you do not have the password, please call the hospital operator.  05/25/2020, 2:30 PM

## 2020-05-25 NOTE — Progress Notes (Addendum)
      [address]       Beverly Howell [address]             [phone]         Subjective: Feels okay this morning. She did not get out of bed yesterday at all.   Objective: Vital signs in last 24 hours: Temp:  [97.4 F (36.3 C)-98.1 F (36.7 C)] 97.6 F (36.4 C) (12/12 0835) Pulse Rate:  [65-98] 81 (12/12 0835) Cardiac Rhythm: Sinus tachycardia;Bundle branch block (12/11 1906) Resp:  [16-20] 16 (12/12 0835) BP: (129-154)/(68-85) 154/68 (12/12 0835) SpO2:  [89 %-99 %] 93 % (12/12 0835)     Intake/Output from previous day: 12/11 0701 - 12/12 0700 In: 713.3 [P.O.:240; IV Piggyback:473.3] Out: 730 [Urine:590; Chest Tube:140] Intake/Output this shift: Total I/O In: -  Out: 300 [Urine:300]  General appearance: alert, cooperative and no distress Heart: regular rate and rhythm, S1, S2 normal, no murmur, click, rub or gallop Lungs: clear to auscultation bilaterally Abdomen: soft, non-tender; bowel sounds normal; no masses,  no organomegaly Extremities: extremities normal, atraumatic, no cyanosis or edema Wound: clean and dry  Lab Results: Recent Labs    05/24/20 0425 05/25/20 0213  WBC 8.1 9.2  HGB 9.1* 10.2*  HCT 27.9* 33.2*  PLT 429* 432*   BMET:  Recent Labs    05/24/20 0425 05/25/20 0213  NA 141 140  K 2.8* 3.6  CL 108 107  CO2 25 24  GLUCOSE 81 97  BUN 6* 7*  CREATININE 0.86 0.87  CALCIUM 8.1* 8.4*    PT/INR: No results for input(s): LABPROT, INR in the last 72 hours. ABG    Component Value Date/Time   TCO2 27 04/21/2020 2117   CBG (last 3)  No results for input(s): GLUCAP in the last 72 hours.  Assessment/Plan:  1. Afebrile, tolerating room air with good oxygen saturation 2. CXR this morning: Stable right base pneumothorax and small right pleural effusion. Stable compared to previous CXR.  3. Keep chest tubes to suction  4. Medical care per primary  Plan: Continue Ancef, keep chest tubes to suction. Continue ambulation  around the room and incentive spirometer. Air leak in chest tube #1 today. Physical therapy consult to work with the patient around the room.    LOS: 5 days    Beverly Howell 05/25/2020 Pt seen and examined; agree with documentation and plan. Continue chest tube management . Beverly Khurana Z. Vickey Sages, MD [phone]

## 2020-05-26 ENCOUNTER — Inpatient Hospital Stay: Payer: Medicare (Managed Care) | Admitting: Adult Health

## 2020-05-26 ENCOUNTER — Inpatient Hospital Stay (HOSPITAL_COMMUNITY): Payer: Medicare (Managed Care)

## 2020-05-26 LAB — COMPREHENSIVE METABOLIC PANEL
ALT: 11 U/L (ref 0–44)
AST: 26 U/L (ref 15–41)
Albumin: 2.1 g/dL — ABNORMAL LOW (ref 3.5–5.0)
Alkaline Phosphatase: 70 U/L (ref 38–126)
Anion gap: 14 (ref 5–15)
BUN: 6 mg/dL — ABNORMAL LOW (ref 8–23)
CO2: 20 mmol/L — ABNORMAL LOW (ref 22–32)
Calcium: 8.5 mg/dL — ABNORMAL LOW (ref 8.9–10.3)
Chloride: 107 mmol/L (ref 98–111)
Creatinine, Ser: 0.74 mg/dL (ref 0.44–1.00)
GFR, Estimated: >60 mL/min (ref >60)
Glucose, Bld: 87 mg/dL (ref 70–99)
Potassium: 4.1 mmol/L (ref 3.5–5.1)
Sodium: 141 mmol/L (ref 135–145)
Total Bilirubin: 0.7 mg/dL (ref 0.3–1.2)
Total Protein: 5.9 g/dL — ABNORMAL LOW (ref 6.5–8.1)

## 2020-05-26 LAB — CBC
HCT: 33 % — ABNORMAL LOW (ref 36.0–46.0)
Hemoglobin: 10.5 g/dL — ABNORMAL LOW (ref 12.0–15.0)
MCH: 31.2 pg (ref 26.0–34.0)
MCHC: 31.8 g/dL (ref 30.0–36.0)
MCV: 97.9 fL (ref 80.0–100.0)
Platelets: 457 10*3/uL — ABNORMAL HIGH (ref 150–400)
RBC: 3.37 MIL/uL — ABNORMAL LOW (ref 3.87–5.11)
RDW: 16.6 % — ABNORMAL HIGH (ref 11.5–15.5)
WBC: 9.8 10*3/uL (ref 4.0–10.5)
nRBC: 0 % (ref 0.0–0.2)

## 2020-05-26 LAB — MAGNESIUM: Magnesium: 1.8 mg/dL (ref 1.7–2.4)

## 2020-05-26 NOTE — Progress Notes (Signed)
Pharmacy Antibiotic Note/Lovenox  Beverly Howell is a 79 y.o. female admitted on 05/19/2020 with MSSA nectrotizing pneumonia.  Pharmacy has been consulted for cefazolin dosing.  MSSA grew from pleural culture. Switch from vanc/cefe to cefazolin. CLCr ~60 ml/min.   Pt is now on D7 of abx for MSSA PNA. Chest tube is in place for drainage. Wbc wnl and remains afebrile.   She is also on full dose Lovenox for bridging for hx of recent DVT and CVA.  Scr<1 Hgb 10.5, plt wnl  Plan: Continue cefazolin 2g IV q8 Continue Lovenox 80mg  SQ BID  Height: 5\' 5"  (165.1 cm) Weight: 81.5 kg (179 lb 10.8 oz) IBW/kg (Calculated) : 57  Temp (24hrs), Avg:98 F (36.7 C), Min:97.6 F (36.4 C), Max:98.5 F (36.9 C)  Recent Labs  Lab 05/19/20 2116 05/19/20 2129 05/19/20 2309 05/22/20 0135 05/22/20 1003 05/23/20 1033 05/24/20 0425 05/25/20 0213 05/26/20 0842  WBC  --    < >  --  8.6  --  9.6 8.1 9.2 9.8  CREATININE  --    < >  --  0.80  --  0.82 0.86 0.87 0.74  LATICACIDVEN 2.0*  --  1.6  --   --   --   --   --   --   VANCOTROUGH  --   --   --   --  21*  --   --   --   --    < > = values in this interval not displayed.    Estimated Creatinine Clearance: 61.1 mL/min (by C-G formula based on SCr of 0.74 mg/dL).    No Known Allergies  Antimicrobials this admission: Cefaz 12/10>>  Vanc 12/7 >>12/10 Cefepime 12/7 >>12/10 Azithro 12/7 (for 2 doses)  VT 21 on 12/9 - change to 750 mg IV q12 hrs  Microbiology 12/7 BCx >> ngtdF 12/7 pleural abscess- MSSA  14/7, PharmD, BCIDP, AAHIVP, CPP Infectious Disease Pharmacist 05/26/2020 11:12 AM

## 2020-05-26 NOTE — Progress Notes (Signed)
Referring Physician(s): Myer Peer)  Supervising Physician: Dr. Loreta Ave  Patient Status:  [facility] - In-pt  Chief Complaint: None  Subjective:  History of recent COVID-19 pneumonia complicated by development of right hydropneumothorax s/p right chest tube placement x2 in IR 05/20/2020. Patient awake and alert laying in bed undergoing a breathing treatment. No complaints. Denies SOB. States appetite is good and she's been getting OOB with walker to go to bathroom. Right chest tube sites c/d/i. Sats 95-98%, no supplemental O2 in use.  Allergies: Patient has no known allergies.  Medications:  Current Facility-Administered Medications:  .  acetaminophen (TYLENOL) tablet 650 mg, 650 mg, Oral, Q6H PRN, 650 mg at 05/22/20 1944 **OR** acetaminophen (TYLENOL) suppository 650 mg, 650 mg, Rectal, Q6H PRN, Eduard Clos, MD .  amLODipine (NORVASC) tablet 5 mg, 5 mg, Oral, Daily, Eduard Clos, MD, 5 mg at 05/26/20 0929 .  atorvastatin (LIPITOR) tablet 40 mg, 40 mg, Oral, Daily, Eduard Clos, MD, 40 mg at 05/26/20 0929 .  ceFAZolin (ANCEF) IVPB 2g/100 mL premix, 2 g, Intravenous, Q8H, Leander Rams, [facility], Last Rate: 200 mL/hr at 05/26/20 0042, 2 g at 05/26/20 0042 .  enoxaparin (LOVENOX) injection 80 mg, 80 mg, Subcutaneous, Q12H, Leander Rams, RPH, 80 mg at 05/26/20 0438 .  influenza vaccine adjuvanted (FLUAD) injection 0.5 mL, 0.5 mL, Intramuscular, Tomorrow-1000, Ghimire, Shanker M, MD .  ipratropium-albuterol (DUONEB) 0.5-2.5 (3) MG/3ML nebulizer solution 3 mL, 3 mL, Nebulization, BID, Ghimire, Werner Lean, MD, 3 mL at 05/26/20 0808 .  MEDLINE mouth rinse, 15 mL, Mouth Rinse, BID, Ghimire, Werner Lean, MD, 15 mL at 05/26/20 0928 .  metoprolol tartrate (LOPRESSOR) tablet 50 mg, 50 mg, Oral, BID, Eduard Clos, MD, 50 mg at 05/26/20 0929 .  mometasone-formoterol (DULERA) 100-5 MCG/ACT inhaler 2 puff, 2 puff, Inhalation, BID, Elgergawy, Leana Roe, MD, 2 puff at  05/26/20 0807 .  oxyCODONE-acetaminophen (PERCOCET/ROXICET) 5-325 MG per tablet 1 tablet, 1 tablet, Oral, Q4H PRN, Shalhoub, Deno Lunger, MD, 1 tablet at 05/24/20 2115 .  pneumococcal 23 valent vaccine (PNEUMOVAX-23) injection 0.5 mL, 0.5 mL, Intramuscular, Tomorrow-1000, Ghimire, Shanker M, MD .  sodium chloride flush (NS) 0.9 % injection 10-40 mL, 10-40 mL, Intracatheter, Q12H, Eduard Clos, MD, 10 mL at 05/26/20 0929 .  sodium chloride flush (NS) 0.9 % injection 10-40 mL, 10-40 mL, Intracatheter, PRN, Eduard Clos, MD    Vital Signs: BP 138/72 (BP Location: Right Arm)   Pulse 89   Temp 97.7 F (36.5 C) (Oral)   Resp 18   Ht 5\' 5"  (1.651 m)   Wt 81.5 kg   SpO2 95%   BMI 29.90 kg/m   Physical Exam Vitals and nursing note reviewed.  Constitutional:      General: She is not in acute distress. Pulmonary:     Effort: Pulmonary effort is normal. No respiratory distress.     Comments: Right chest tube sites without erythema, drainage, or active bleeding. Right chest tube #1 with approximately cloudy yellow fluid in pleure-vac; tube to suction with (+) air leak. Right chest tube #2 with bloody fluid in pleure-vac; tube to suction with (+) air leak. Skin:    General: Skin is warm and dry.  Neurological:     Mental Status: She is alert and oriented to person, place, and time.     Imaging: DG Chest 1 View  Result Date: 05/24/2020 CLINICAL DATA:  Pneumothorax. EXAM: CHEST  1 VIEW COMPARISON:  May 23, 2020.  FINDINGS: Stable cardiomediastinal silhouette. Stable position of 2 right-sided chest tubes. Stable mild pneumothorax is noted laterally. Stable right basilar atelectasis is noted with probable pleural effusion. Pleural thickening is noted. Stable left basilar atelectasis or infiltrates are noted. Bony thorax is unremarkable. IMPRESSION: Stable position of 2 right-sided chest tubes. Stable mild pneumothorax is noted laterally. Stable right basilar atelectasis is  noted with probable pleural effusion. Stable left basilar atelectasis or infiltrates are noted. Electronically Signed   By: Lupita Raider M.D.   On: 05/24/2020 08:46   DG Chest Port 1 View  Result Date: 05/26/2020 CLINICAL DATA:  Chest tube. EXAM: PORTABLE CHEST 1 VIEW COMPARISON:  05/24/2020 FINDINGS: The cardiomediastinal silhouette is unchanged. 2 right-sided chest tubes remain in place. There has been interval retraction of the more inferiorly positioned chest tube which now terminates over the mid to lateral aspect of the right lung base. A small right-sided pneumothorax is again noted laterally and has not significantly changed in size. Airspace opacity in the right mid and lower lung and a small right pleural effusion are unchanged, as are mild left basilar airspace opacities. A small amount of subcutaneous emphysema in the right lateral chest wall/axilla has decreased. IMPRESSION: 1. Interval retraction of the more inferiorly positioned right chest tube. Unchanged small right pneumothorax. 2. Unchanged right greater than left basilar airspace disease and small right pleural effusion. Electronically Signed   By: Sebastian Ache M.D.   On: 05/26/2020 07:42   DG Chest Port 1 View  Result Date: 05/23/2020 CLINICAL DATA:  Pleural effusion. EXAM: PORTABLE CHEST 1 VIEW COMPARISON:  05/22/2020. FINDINGS: Two right chest tubes in stable position. Stable right base pneumothorax and small right pleural effusion. Persistent bibasilar atelectasis/infiltrates. Heart size stable. Right chest wall subcutaneous emphysema again noted. Degenerative change both shoulders and thoracic spine. IMPRESSION: 1. Two right chest tubes in stable position. Stable right base pneumothorax and small right pleural effusion. Right chest wall subcutaneous emphysema again noted. 2. Persistent bibasilar atelectasis/infiltrates. Electronically Signed   By: Maisie Fus  Register   On: 05/23/2020 05:55    Labs:  CBC: Recent Labs     05/23/20 1033 05/24/20 0425 05/25/20 0213 05/26/20 0842  WBC 9.6 8.1 9.2 9.8  HGB 9.7* 9.1* 10.2* 10.5*  HCT 29.9* 27.9* 33.2* 33.0*  PLT 442* 429* 432* 457*    COAGS: Recent Labs    04/21/20 1950 05/20/20 1237 05/22/20 0135 05/22/20 0727 05/22/20 1710 05/23/20 0116  INR 1.5*  --   --   --   --   --   APTT 27   < > 37* 47* 77* 127*   < > = values in this interval not displayed.    BMP: Recent Labs    05/23/20 1033 05/24/20 0425 05/25/20 0213 05/26/20 0842  NA 143 141 140 141  K 3.9 2.8* 3.6 4.1  CL 110 108 107 107  CO2 22 25 24  20*  GLUCOSE 93 81 97 87  BUN 7* 6* 7* 6*  CALCIUM 8.3* 8.1* 8.4* 8.5*  CREATININE 0.82 0.86 0.87 0.74  GFRNONAA >60 >60 >60 >60    LIVER FUNCTION TESTS: Recent Labs    05/23/20 1033 05/24/20 0425 05/25/20 0213 05/26/20 0842  BILITOT 0.8 0.6 0.4 0.7  AST 18 18 19 26   ALT 15 16 14 11   ALKPHOS 55 51 66 70  PROT 5.3* 5.0* 5.7* 5.9*  ALBUMIN 1.8* 1.7* 1.9* 2.1*    Assessment and Plan:  History of recent COVID-19 pneumonia complicated by development  of right hydropneumothorax s/p right chest tube placement x2 in IR 05/20/2020. Chest tubes stable Continue current chest tube management- tubes to remain to suction at this time, monitor Qshift output and daily CXR; further tube management per TCTS. Further plans per TRH/TCTS- appreciate and agree with management. IR will follow from a distance, please call IR with questions/concerns.   Electronically Signed: Brayton El, PA-C 05/26/2020, 11:18 AM   I spent a total of 25 Minutes at the the patient's bedside AND on the patient's hospital floor or unit, greater than 50% of which was counseling/coordinating care for right hydropneumothorax s/p right chest tube placement x2.

## 2020-05-26 NOTE — Progress Notes (Addendum)
      [address]       [name] [address]             [phone]           Subjective: Patient just waking up. She states her breathing is ok. She has pain at chest tube sites.  Objective: Vital signs in last 24 hours: Temp:  [97.6 F (36.4 C)-98.5 F (36.9 C)] 98 F (36.7 C) (12/13 0426) Pulse Rate:  [72-88] 72 (12/13 0426) Cardiac Rhythm: Normal sinus rhythm (12/12 2100) Resp:  [16-21] 21 (12/13 0426) BP: (133-156)/(63-79) 155/66 (12/13 0426) SpO2:  [93 %-100 %] 100 % (12/13 0426)      Intake/Output from previous day: 12/12 0701 - 12/13 0700 In: 780 [P.O.:480; IV Piggyback:300] Out: 690 [Urine:540; Chest Tube:150]   Physical Exam:  Cardiovascular: RRR Pulmonary: Clear on the left and audible squeak on the right (chest tubes) Wounds: Dressing is clean and dry.   Chest Tubes: to suction. Pigtail #2 has small air leak.  Lab Results: CBC: Recent Labs    05/24/20 0425 05/25/20 0213  WBC 8.1 9.2  HGB 9.1* 10.2*  HCT 27.9* 33.2*  PLT 429* 432*   BMET:  Recent Labs    05/24/20 0425 05/25/20 0213  NA 141 140  K 2.8* 3.6  CL 108 107  CO2 25 24  GLUCOSE 81 97  BUN 6* 7*  CREATININE 0.86 0.87  CALCIUM 8.1* 8.4*    PT/INR: No results for input(s): LABPROT, INR in the last 72 hours. ABG:  INR: Will add last result for INR, ABG once components are confirmed Will add last 4 CBG results once components are confirmed  Assessment/Plan:  1. CV - SR with HR in the 70's. On Amlodipine 5 mg daily and Lopressor 50 mg bid. 2.  Pulmonary - On room air. Chest tubes with 150 cc (total for both) for 24 hours . I marked both Pleura vacs this am for more accurate output. Chest tubes to suction, no air leak from pigtail #1 but a small air leak in pigtail #2. CXR this am appears to show stable,right lateral pneumothorax and small right pleural effusion.  Chest tubes to remain for now. Check CXR in am. Patient not a surgical candidate. 3. History of  CVA, recent DVT-on Lovenox 80 mg Mullins bid 4. 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  Donielle M ZimmermanPA-C 05/26/2020,7:07 AM [phone]  Keep both chest tubes for now I have seen and examined [name] and agree with the above assessment  and plan.  [name] MD Beeper [phone] Office [phone] 05/26/2020 11:42 AM

## 2020-05-26 NOTE — Progress Notes (Signed)
PROGRESS NOTE        [name] Age: 79 y.o. Sex: female Date of Birth: 1940-12-10 Admit Date: 05/19/2020 Admitting Physician Eduard Clos, MD [id number], Provider Not In  Brief Narrative: Patient is a 79 y.o. female with history of recent severe COVID-19 infection-along with CVA/lower extremity DVT-presenting to the hospital with shortness of breath-found to have necrotizing right-sided pneumonia with loculated hydropneumothorax.  See below for further details.  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  Multifocal pneumonia.  Antimicrobial therapy: Ancef:12/10>> Vancomycin: 12/6>>12/10 Cefepime: 12/6>>12/10 Zithromax: 12/6>> 12/8  Microbiology data: 12/7>> blood cultures: No growth 12/7>> right pleural fluid culture: MSSA  Procedures : 12/7>> CT-guided chest tube placement x2 by interventional radiology  Consults: CT surgery, IR  DVT Prophylaxis : IV heparin>>SQ therapeutic Lovenox   Subjective: No major issues overnight-lying comfortably in bed.  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  Await further input from CT surgery.  Hypokalemia: Repleted  History of recent right MCA CVA with left-sided weakness: Thought to be provoked due to COVID-19 hypercoagulable  state-minimal left-sided weakness persists.  Remains on therapeutic Lovenox-once further stable-not felt to require any further procedures-we can transition to oral anticoagulation.    Recent history of bilateral lower extremity DVT: Thought to be provoked due to COVID-19 hypercoagulable state-currently on therapeutic anticoagulation  HTN: BP controlled-continue amlodipine.  Recent COVID-19 infection with severe hypoxemia: Does not require any further isolation as > 3 weeks out from her initial presentation.   Diet: Diet Order            Diet Heart Room service appropriate? Yes; Fluid consistency: Thin  Diet effective now                  Code Status: Full code   Family Communication: Daughter [phone] 12/12-we will update on 12/14  Disposition Plan: Status is: Inpatient  Remains inpatient appropriate because:Inpatient level of care appropriate due to severity of illness   Dispo: The patient is from: Home              Anticipated d/c is to: TBD              Anticipated d/c date is: > 3 days              Patient currently is not medically stable to d/c.   Barriers to Discharge: MSSA pneumonia with empyema-on IV antibiotics-chest tube in place  Antimicrobial agents: Anti-infectives (From admission, onward)   Start     Dose/Rate Route Frequency Ordered Stop   05/23/20 1600  ceFAZolin (ANCEF) IVPB 2g/100 mL premix        2 g 200 mL/hr over 30 Minutes Intravenous Every 8 hours 05/23/20 1414     05/23/20 0300  vancomycin (VANCOREADY) IVPB 750 mg/150 mL  Status:  Discontinued        750 mg 150 mL/hr over 60  Minutes Intravenous Every 12 hours 05/22/20 1149 05/23/20 1356   05/23/20 0100  vancomycin (VANCOREADY) IVPB 750 mg/150 mL  Status:  Discontinued        750 mg 150 mL/hr over 60 Minutes Intravenous Every 12 hours 05/22/20 1146 05/22/20 1149   05/20/20 2200  vancomycin (VANCOCIN) IVPB 1000 mg/200 mL premix  Status:  Discontinued        1,000 mg 200 mL/hr  over 60 Minutes Intravenous Every 12 hours 05/20/20 1401 05/22/20 1146   05/20/20 1000  ceFEPIme (MAXIPIME) 2 g in sodium chloride 0.9 % 100 mL IVPB  Status:  Discontinued        2 g 200 mL/hr over 30 Minutes Intravenous Every 8 hours 05/20/20 0226 05/23/20 1356   05/20/20 1000  vancomycin (VANCOREADY) IVPB 500 mg/100 mL  Status:  Discontinued        500 mg 100 mL/hr over 60 Minutes Intravenous Every 12 hours 05/20/20 0240 05/20/20 1401   05/20/20 0230  azithromycin (ZITHROMAX) 500 mg in sodium chloride 0.9 % 250 mL IVPB  Status:  Discontinued        500 mg 250 mL/hr over 60 Minutes Intravenous Daily at bedtime 05/20/20 0226 05/21/20 1443   05/20/20 0100  vancomycin (VANCOCIN) IVPB 1000 mg/200 mL premix        1,000 mg 200 mL/hr over 60 Minutes Intravenous  Once 05/20/20 0046 05/20/20 0402   05/20/20 0045  ceFEPIme (MAXIPIME) 2 g in sodium chloride 0.9 % 100 mL IVPB        2 g 200 mL/hr over 30 Minutes Intravenous  Once 05/20/20 0030 05/20/20 0221       Time spent: 15-minutes-Greater than 50% of this time was spent in counseling, explanation of diagnosis, planning of further management, and coordination of care.  MEDICATIONS: Scheduled Meds:  amLODipine  5 mg Oral Daily   atorvastatin  40 mg Oral Daily   enoxaparin (LOVENOX) injection  80 mg Subcutaneous Q12H   influenza vaccine adjuvanted  0.5 mL Intramuscular Tomorrow-1000   ipratropium-albuterol  3 mL Nebulization BID   mouth rinse  15 mL Mouth Rinse BID   metoprolol tartrate  50 mg Oral BID   mometasone-formoterol  2 puff Inhalation BID   pneumococcal 23 valent vaccine  0.5 mL Intramuscular Tomorrow-1000   sodium chloride flush  10-40 mL Intracatheter Q12H   Continuous Infusions:   ceFAZolin (ANCEF) IV 2 g (05/26/20 1120)   PRN Meds:.acetaminophen **OR** acetaminophen, oxyCODONE-acetaminophen, sodium chloride flush   PHYSICAL EXAM: Vital signs: Vitals:   05/26/20 0735 05/26/20 0740 05/26/20 0810 05/26/20  1112  BP:  (!) 168/75  138/72  Pulse:  84  89  Resp:  20  18  Temp:  97.8 F (36.6 C)  97.7 F (36.5 C)  TempSrc:  Oral  Oral  SpO2: 97% 98% 98% 95%  Weight:      Height:       Filed Weights   05/20/20 1330 05/21/20 0028  Weight: 98 kg 81.5 kg   Body mass index is 29.9 kg/m.   Gen Exam:Alert awake-not in any distress HEENT:atraumatic, normocephalic Chest: B/L clear to auscultation anteriorly CVS:S1S2 regular Abdomen:soft non tender, non distended Extremities:no edema Neurology: Non focal Skin: no rash  I have personally reviewed following labs and imaging studies  LABORATORY DATA: CBC: Recent Labs  Lab 05/19/20 2129 05/22/20 0135 05/23/20 1033 05/24/20 0425 05/25/20 0213 05/26/20 0842  WBC 18.5* 8.6 9.6 8.1 9.2 9.8  NEUTROABS 14.3*  --   --   --   --   --  HGB 12.3 10.9* 9.7* 9.1* 10.2* 10.5*  HCT 40.2 35.5* 29.9* 27.9* 33.2* 33.0*  MCV 101.3* 99.2 96.5 96.9 97.9 97.9  PLT 681* 262 442* 429* 432* 457*    Basic Metabolic Panel: Recent Labs  Lab 05/22/20 0135 05/23/20 1033 05/24/20 0425 05/25/20 0213 05/26/20 0842  NA 140 143 141 140 141  K 3.5 3.9 2.8* 3.6 4.1  CL 105 110 108 107 107  CO2 26 22 25 24  20*  GLUCOSE 90 93 81 97 87  BUN 6* 7* 6* 7* 6*  CREATININE 0.80 0.82 0.86 0.87 0.74  CALCIUM 8.2* 8.3* 8.1* 8.4* 8.5*  MG  --   --   --  2.1 1.8    GFR: Estimated Creatinine Clearance: 61.1 mL/min (by C-G formula based on SCr of 0.74 mg/dL).  Liver Function Tests: Recent Labs  Lab 05/22/20 0135 05/23/20 1033 05/24/20 0425 05/25/20 0213 05/26/20 0842  AST 16 18 18 19 26   ALT 17 15 16 14 11   ALKPHOS 59 55 51 66 70  BILITOT 0.7 0.8 0.6 0.4 0.7  PROT 5.6* 5.3* 5.0* 5.7* 5.9*  ALBUMIN 1.9* 1.8* 1.7* 1.9* 2.1*   No results for input(s): LIPASE, AMYLASE in the last 168 hours. No results for input(s): AMMONIA in the last 168 hours.  Coagulation Profile: No results for input(s): INR, PROTIME in the last 168 hours.  Cardiac Enzymes: No  results for input(s): CKTOTAL, CKMB, CKMBINDEX, TROPONINI in the last 168 hours.  BNP (last 3 results) No results for input(s): PROBNP in the last 8760 hours.  Lipid Profile: No results for input(s): CHOL, HDL, LDLCALC, TRIG, CHOLHDL, LDLDIRECT in the last 72 hours.  Thyroid Function Tests: No results for input(s): TSH, T4TOTAL, FREET4, T3FREE, THYROIDAB in the last 72 hours.  Anemia Panel: No results for input(s): VITAMINB12, FOLATE, FERRITIN, TIBC, IRON, RETICCTPCT in the last 72 hours.  Urine analysis:    Component Value Date/Time   COLORURINE AMBER (A) 04/22/2020 1205   APPEARANCEUR HAZY (A) 04/22/2020 1205   LABSPEC 1.026 04/22/2020 1205   PHURINE 5.0 04/22/2020 1205   GLUCOSEU NEGATIVE 04/22/2020 1205   HGBUR NEGATIVE 04/22/2020 1205   BILIRUBINUR NEGATIVE 04/22/2020 1205   KETONESUR 5 (A) 04/22/2020 1205   PROTEINUR 30 (A) 04/22/2020 1205   UROBILINOGEN 0.2 02/03/2014 2029   NITRITE NEGATIVE 04/22/2020 1205   LEUKOCYTESUR NEGATIVE 04/22/2020 1205    Sepsis Labs: Lactic Acid, Venous    Component Value Date/Time   LATICACIDVEN 1.6 05/19/2020 2309    MICROBIOLOGY: Recent Results (from the past 240 hour(s))  Blood culture (routine x 2)     Status: None   Collection Time: 05/20/20  1:14 AM   Specimen: BLOOD  Result Value Ref Range Status   Specimen Description BLOOD SITE NOT SPECIFIED  Final   Special Requests   Final    BOTTLES DRAWN AEROBIC AND ANAEROBIC Blood Culture adequate volume   Culture   Final    NO GROWTH 5 DAYS Performed at [facility], [address] [place], [facility] [address]    Report Status 05/25/2020 FINAL  Final  Blood culture (routine x 2)     Status: None   Collection Time: 05/20/20  3:55 AM   Specimen: BLOOD  Result Value Ref Range Status   Specimen Description BLOOD A-LINE  Final   Special Requests   Final    BOTTLES DRAWN AEROBIC AND ANAEROBIC Blood Culture adequate volume   Culture   Final    NO GROWTH 5  DAYS Performed at  [facility], [address], [place], [place] [address]    Report Status 05/25/2020 FINAL  Final  Aerobic/Anaerobic Culture (surgical/deep wound)     Status: None   Collection Time: 05/20/20  8:57 PM   Specimen: Abscess  Result Value Ref Range Status   Specimen Description ABSCESS RIGHT PLEURAL  Final   Special Requests SYRINGE  Final   Gram Stain   Final    ABUNDANT WBC PRESENT, PREDOMINANTLY PMN FEW GRAM POSITIVE COCCI IN PAIRS IN CLUSTERS    Culture   Final    RARE STAPHYLOCOCCUS AUREUS NO ANAEROBES ISOLATED Performed at [facility], [address] [place], [place] [address]    Report Status 05/25/2020 FINAL  Final   Organism ID, Bacteria STAPHYLOCOCCUS AUREUS  Final      Susceptibility   Staphylococcus aureus - MIC*    CIPROFLOXACIN <=0.5 SENSITIVE Sensitive     ERYTHROMYCIN 0.5 SENSITIVE Sensitive     GENTAMICIN <=0.5 SENSITIVE Sensitive     OXACILLIN <=0.25 SENSITIVE Sensitive     TETRACYCLINE <=1 SENSITIVE Sensitive     VANCOMYCIN 1 SENSITIVE Sensitive     TRIMETH/SULFA <=10 SENSITIVE Sensitive     CLINDAMYCIN <=0.25 SENSITIVE Sensitive     RIFAMPIN <=0.5 SENSITIVE Sensitive     Inducible Clindamycin NEGATIVE Sensitive     * RARE STAPHYLOCOCCUS AUREUS    RADIOLOGY STUDIES/RESULTS: DG Chest Port 1 View  Result Date: 05/26/2020 CLINICAL DATA:  Chest tube. EXAM: PORTABLE CHEST 1 VIEW COMPARISON:  05/24/2020 FINDINGS: The cardiomediastinal silhouette is unchanged. 2 right-sided chest tubes remain in place. There has been interval retraction of the more inferiorly positioned chest tube which now terminates over the mid to lateral aspect of the right lung base. A small right-sided pneumothorax is again noted laterally and has not significantly changed in size. Airspace opacity in the right mid and lower lung and a small right pleural effusion are unchanged, as are mild left basilar airspace opacities. A small amount of subcutaneous emphysema in the right lateral  chest wall/axilla has decreased. IMPRESSION: 1. Interval retraction of the more inferiorly positioned right chest tube. Unchanged small right pneumothorax. 2. Unchanged right greater than left basilar airspace disease and small right pleural effusion. Electronically Signed   By: Sebastian Ache M.D.   On: 05/26/2020 07:42     LOS: 6 days   Jeoffrey Massed, MD  [facility]    To contact the attending provider between 7A-7P or the covering provider during after hours 7P-7A, please log into the web site [email or web address] and access using universal [place] password for that web site. If you do not have the password, please call the hospital operator.  05/26/2020, 12:00 PM

## 2020-05-27 ENCOUNTER — Other Ambulatory Visit: Payer: Self-pay

## 2020-05-27 ENCOUNTER — Inpatient Hospital Stay (HOSPITAL_COMMUNITY): Payer: Medicare (Managed Care)

## 2020-05-27 DIAGNOSIS — J850 Gangrene and necrosis of lung: Principal | ICD-10-CM

## 2020-05-27 DIAGNOSIS — Z8673 Personal history of transient ischemic attack (TIA), and cerebral infarction without residual deficits: Secondary | ICD-10-CM

## 2020-05-27 DIAGNOSIS — J860 Pyothorax with fistula: Secondary | ICD-10-CM

## 2020-05-27 DIAGNOSIS — I10 Essential (primary) hypertension: Secondary | ICD-10-CM

## 2020-05-27 DIAGNOSIS — Z86718 Personal history of other venous thrombosis and embolism: Secondary | ICD-10-CM

## 2020-05-27 DIAGNOSIS — J851 Abscess of lung with pneumonia: Secondary | ICD-10-CM

## 2020-05-27 DIAGNOSIS — J869 Pyothorax without fistula: Secondary | ICD-10-CM

## 2020-05-27 LAB — COMPREHENSIVE METABOLIC PANEL
ALT: 11 U/L (ref 0–44)
AST: 29 U/L (ref 15–41)
Albumin: 2.1 g/dL — ABNORMAL LOW (ref 3.5–5.0)
Alkaline Phosphatase: 67 U/L (ref 38–126)
Anion gap: 9 (ref 5–15)
BUN: <5 mg/dL — ABNORMAL LOW (ref 8–23)
CO2: 24 mmol/L (ref 22–32)
Calcium: 8.5 mg/dL — ABNORMAL LOW (ref 8.9–10.3)
Chloride: 108 mmol/L (ref 98–111)
Creatinine, Ser: 0.8 mg/dL (ref 0.44–1.00)
GFR, Estimated: >60 mL/min (ref >60)
Glucose, Bld: 89 mg/dL (ref 70–99)
Potassium: 3.4 mmol/L — ABNORMAL LOW (ref 3.5–5.1)
Sodium: 141 mmol/L (ref 135–145)
Total Bilirubin: 0.4 mg/dL (ref 0.3–1.2)
Total Protein: 6.2 g/dL — ABNORMAL LOW (ref 6.5–8.1)

## 2020-05-27 LAB — CBC
HCT: 33.7 % — ABNORMAL LOW (ref 36.0–46.0)
Hemoglobin: 10.7 g/dL — ABNORMAL LOW (ref 12.0–15.0)
MCH: 30.9 pg (ref 26.0–34.0)
MCHC: 31.8 g/dL (ref 30.0–36.0)
MCV: 97.4 fL (ref 80.0–100.0)
Platelets: 454 10*3/uL — ABNORMAL HIGH (ref 150–400)
RBC: 3.46 MIL/uL — ABNORMAL LOW (ref 3.87–5.11)
RDW: 16.7 % — ABNORMAL HIGH (ref 11.5–15.5)
WBC: 8 10*3/uL (ref 4.0–10.5)
nRBC: 0 % (ref 0.0–0.2)

## 2020-05-27 LAB — HIV ANTIBODY (ROUTINE TESTING W REFLEX): HIV Screen 4th Generation wRfx: NONREACTIVE

## 2020-05-27 MED ORDER — SODIUM CHLORIDE 0.9 % IV SOLN
3.0000 g | Freq: Four times a day (QID) | INTRAVENOUS | Status: DC
  Administered 2020-05-27 – 2020-06-06 (×40): 3 g via INTRAVENOUS
  Filled 2020-05-27 (×3): qty 3
  Filled 2020-05-27: qty 8
  Filled 2020-05-27 (×9): qty 3
  Filled 2020-05-27: qty 8
  Filled 2020-05-27 (×4): qty 3
  Filled 2020-05-27: qty 8
  Filled 2020-05-27: qty 3
  Filled 2020-05-27: qty 8
  Filled 2020-05-27 (×5): qty 3
  Filled 2020-05-27 (×6): qty 8
  Filled 2020-05-27 (×9): qty 3
  Filled 2020-05-27: qty 8
  Filled 2020-05-27 (×2): qty 3

## 2020-05-27 MED ORDER — POTASSIUM CHLORIDE CRYS ER 20 MEQ PO TBCR
40.0000 meq | EXTENDED_RELEASE_TABLET | Freq: Once | ORAL | Status: AC
  Administered 2020-05-27: 12:00:00 40 meq via ORAL
  Filled 2020-05-27: qty 2

## 2020-05-27 NOTE — Progress Notes (Signed)
Occupational Therapy Treatment [name] MRN: [id number] DOB: [birth year] Today's Date: 05/27/2020    History of present illness Pt is a 79 y/o female admitted from [place] secondary to SOB and cough with hemoptysis. X-rays showed right-sided hydropneumothorax and a CT angiogram was done which shows hydropneumothorax and necrotizing pneumonia on the right side and on the left side with features concerning for previous pneumonia from Covid. Pt is s/p chest tube placement. Pt with recent admission for CVA and COVID. PMH includes HTN and DVT.   OT comments  Patient continues to make steady progress towards goals in skilled OT session. Patient's session encompassed sit<>stand transfers, and bed mobility due to increased RR (noted as high as 44) and HR (noted to be as high as 153 in standing). Pt extremely motivated to progress in therapy, requiring emotional support at times with regard to frustration that pt couldn't do more in therapy. Pt provided education with regard to energy conservation as well as encouragement to continue to use incentive spirometer. Pt left with all needs met sitting EOB (RN and tech notified) using incentive spirometer. Therapy will continue to follow.     Follow Up Recommendations  SNF;Supervision/Assistance - 24 hour    Equipment Recommendations  3 in 1 bedside commode;Wheelchair (measurements OT);Wheelchair cushion (measurements OT);Other (comment)    Recommendations for Other Services      Precautions / Restrictions Precautions Precautions: Fall Precaution Comments: Chest tube Restrictions Weight Bearing Restrictions: No       Mobility Bed Mobility Overal bed mobility: Needs Assistance Bed Mobility: Supine to Sit     Supine to sit: Supervision        Transfers Overall transfer level: Needs assistance Equipment used: Rolling walker (2 wheeled) Transfers: Sit to/from Stand Sit to Stand: Mod assist         General transfer  comment: Assist to bring hips up and for balance completed x3 due to tachycardia in standing    Balance Overall balance assessment: Needs assistance Sitting-balance support: No upper extremity supported;Feet supported Sitting balance-Leahy Scale: Good     Standing balance support: Bilateral upper extremity supported Standing balance-Leahy Scale: Poor Standing balance comment: reliant on UE support                           ADL either performed or assessed with clinical judgement   ADL Overall ADL's : Needs assistance/impaired     Grooming: Wash/dry hands;Wash/dry face;Sitting;Supervision/safety               Lower Body Dressing: Sitting/lateral leans;Minimal assistance               Functional mobility during ADLs: Moderate assistance;Rolling walker General ADL Comments: Focused on sit<>stands due to increased HR and RR, remains motivated to progress with therapy, left sitting EOB completing incentive spirometer     Vision       Perception     Praxis      Cognition Arousal/Alertness: Awake/alert Behavior During Therapy: WFL for tasks assessed/performed Overall Cognitive Status: Within Functional Limits for tasks assessed                                          Exercises     Shoulder Instructions       General Comments      Pertinent Vitals/ Pain  Pain Assessment: Faces Faces Pain Scale: Hurts a little bit Pain Location: Chest tube site Pain Descriptors / Indicators: Grimacing Pain Intervention(s): Limited activity within patient's tolerance;Monitored during session;Repositioned  Home Living                                          Prior Functioning/Environment              Frequency  Min 2X/week        Progress Toward Goals  OT Goals(current goals can now be found in the care plan section)  Progress towards OT goals: Progressing toward goals  Acute Rehab OT Goals Patient  Stated Goal: get stronger OT Goal Formulation: With patient Time For Goal Achievement: 06/05/20 Potential to Achieve Goals: Good  Plan Discharge plan remains appropriate    Co-evaluation                 AM-PAC OT "6 Clicks" Daily Activity     Outcome Measure   Help from another person eating meals?: None Help from another person taking care of personal grooming?: A Little Help from another person toileting, which includes using toliet, bedpan, or urinal?: A Little Help from another person bathing (including washing, rinsing, drying)?: A Lot Help from another person to put on and taking off regular upper body clothing?: A Little Help from another person to put on and taking off regular lower body clothing?: A Lot 6 Click Score: 17    End of Session Equipment Utilized During Treatment: Gait belt;Rolling walker  OT Visit Diagnosis: Unsteadiness on feet (R26.81);Other abnormalities of gait and mobility (R26.89);Hemiplegia and hemiparesis;Muscle weakness (generalized) (M62.81);Other symptoms and signs involving cognitive function Hemiplegia - Right/Left: Left Hemiplegia - dominant/non-dominant: Non-Dominant Hemiplegia - caused by: Cerebral infarction   Activity Tolerance Patient limited by fatigue;Treatment limited secondary to medical complications (Comment) (Increased HR and RR with movement)   Patient Left in bed;with call bell/phone within reach (sitting EOB, RN aware)   Nurse Communication Mobility status (pt sitting EOB with alarm on)        Time: 0932-3557 OT Time Calculation (min): 23 min  Charges: OT General Charges $OT Visit: 1 Visit OT Treatments $Self Care/Home Management : 23-37 mins  [place]. Carlisle Enke, COTA/L Acute Rehabilitation Services [place] 05/27/2020, 3:41 PM

## 2020-05-27 NOTE — Progress Notes (Addendum)
      [address]       [name] [address]             [phone]           Subjective: Beverly sleeping and awakened this am.  Objective: Vital signs in last 24 hours: Temp:  [97.6 F (36.4 C)-98.3 F (36.8 C)] 97.7 F (36.5 C) (12/14 0451) Pulse Rate:  [74-94] 74 (12/14 0451) Cardiac Rhythm: Normal sinus rhythm;Bundle branch block (12/13 1900) Resp:  [16-25] 20 (12/14 0451) BP: (129-168)/(72-82) 158/76 (12/14 0451) SpO2:  [95 %-100 %] 97 % (12/14 0451)     Intake/Output from previous day: 12/13 0701 - 12/14 0700 In: 640 [P.O.:340; IV Piggyback:300] Out: 860 [Urine:700; Chest Tube:160]   Physical Exam:  Cardiovascular: RRR Pulmonary: Clear on the left and audible squeak on the right (chest tubes) Wounds: Dressing is clean and dry.   Chest Tubes: to suction. Pigtail #1 with small, intermittent air leak and pigtail #2 has had small air leak.  Lab Results: CBC: Recent Labs    05/26/20 0842 05/27/20 0148  WBC 9.8 8.0  HGB 10.5* 10.7*  HCT 33.0* 33.7*  PLT 457* 454*   BMET:  Recent Labs    05/26/20 0842 05/27/20 0148  NA 141 141  K 4.1 3.4*  CL 107 108  CO2 20* 24  GLUCOSE 87 89  BUN 6* <5*  CREATININE 0.74 0.80  CALCIUM 8.5* 8.5*    PT/INR: No results for input(s): LABPROT, INR in the last 72 hours. ABG:  INR: Will add last result for INR, ABG once components are confirmed Will add last 4 CBG results once components are confirmed  Assessment/Plan:  1. CV - SR with HR in the 70's. Hypertensive. On Amlodipine 5 mg daily and Lopressor 50 mg bid;per primary 2.  Pulmonary - On room air. Chest tubes with 160 cc (total for both) for 24 hours . Chest tubes to suction, small, intermittent air leak from pigtail #1 and continued, small air leak in pigtail #2. CXR this am appears to show stable,right lateral pneumothorax and small right pleural effusion.  Chest tubes to remain for now. Check CXR in am. Beverly not a surgical  candidate. 3. History of CVA, recent DVT-on Lovenox 80 mg [place] bid 4. 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  Donielle M ZimmermanPA-C 05/27/2020,7:01 AM [phone] Chest xray reviewed- some improved, will decrease level of suction on chest tubes today back to -20 cm in spite of MRI brain findings Beverly Howell   I have seen and examined Beverly Howell and agree with the above assessment  and plan.  [name] MD [facility] [phone] Office [phone] 05/27/2020 8:12 AM

## 2020-05-27 NOTE — Progress Notes (Signed)
Physical Therapy Treatment [name] MRN: [id number] DOB: [birth year] Today's Date: 05/27/2020    History of Present Illness Pt is a 79 y/o female admitted [facility] secondary to SOB and cough with hemoptysis. X-rays showed right-sided hydropneumothorax and a CT angiogram was done which shows hydropneumothorax and necrotizing pneumonia on the right side and on the left side with features concerning for previous pneumonia from Covid. Pt is s/p chest tube placement. Pt with recent admission for CVA and COVID. PMH includes HTN and DVT.    PT Comments    Pt supine in bed on arrival.  Pt required encouragement to participate in PT session this pm.  She remains limited to in room therapy as she is on continuous suction.  Pt also with elevated HR in standing to 130s bpm.  Continue to recommend rehab in a post acute setting to maximize functional gains and improve endurance before returning home.     Follow Up Recommendations  SNF;Supervision/Assistance - 24 hour     Equipment Recommendations  Wheelchair (measurements PT);Wheelchair cushion (measurements PT)    Recommendations for Other Services       Precautions / Restrictions Precautions Precautions: Fall Precaution Comments: Chest tube Restrictions Weight Bearing Restrictions: No    Mobility  Bed Mobility Overal bed mobility: Needs Assistance Bed Mobility: Supine to Sit;Sit to Supine Rolling: Min guard   Supine to sit: Supervision Sit to supine: Supervision   General bed mobility comments: Pt able to move to sitting and back to supine unassisted.  Transfers Overall transfer level: Needs assistance Equipment used: Rolling walker (2 wheeled) Transfers: Sit to/from Stand Sit to Stand: Min guard         General transfer comment: Cues for hand placement as patient intially pulling on RW to rise into standing.  Once she pushed from bed she required decreased  assistance.  Ambulation/Gait Ambulation/Gait assistance: Min assist (Pt performed march in place with RW and Min guard x 30 sec.)               Stairs             Wheelchair Mobility    Modified Rankin (Stroke Patients Only)       Balance Overall balance assessment: Needs assistance Sitting-balance support: No upper extremity supported;Feet supported Sitting balance-Leahy Scale: Good     Standing balance support: Bilateral upper extremity supported Standing balance-Leahy Scale: Poor Standing balance comment: reliant on UE support                            Cognition Arousal/Alertness: Awake/alert Behavior During Therapy: WFL for tasks assessed/performed;Agitated (agitated initially but short lasted) Overall Cognitive Status: Within Functional Limits for tasks assessed                                 General Comments: Patient seems to be doing better today.  Following commands, and much better.      Exercises General Exercises - Lower Extremity Hip Flexion/Marching: AROM;Both;10 reps;Standing Mini-Sqauts: AROM;Both;10 reps;Standing    General Comments        Pertinent Vitals/Pain Pain Assessment: Faces Faces Pain Scale: Hurts a little bit Pain Location: Chest tube site Pain Descriptors / Indicators: Grimacing Pain Intervention(s): Monitored during session    Home Living  Prior Function            PT Goals (current goals can now be found in the care plan section) Acute Rehab PT Goals Patient Stated Goal: get stronger Potential to Achieve Goals: Fair Progress towards PT goals: Progressing toward goals    Frequency    Min 2X/week      PT Plan Current plan remains appropriate    Co-evaluation              AM-PAC PT "6 Clicks" Mobility   Outcome Measure  Help needed turning from your back to your side while in a flat bed without using bedrails?: A Little Help needed moving  from lying on your back to sitting on the side of a flat bed without using bedrails?: A Little Help needed moving to and from a bed to a chair (including a wheelchair)?: A Little Help needed standing up from a chair using your arms (e.g., wheelchair or bedside chair)?: A Little Help needed to walk in hospital room?: A Little Help needed climbing 3-5 steps with a railing? : Total 6 Click Score: 16    End of Session Equipment Utilized During Treatment: Gait belt;Oxygen Activity Tolerance: Patient tolerated treatment well Patient left: in bed;with bed alarm set;with call bell/phone within reach Nurse Communication: Mobility status PT Visit Diagnosis: Muscle weakness (generalized) (M62.81);Unsteadiness on feet (R26.81) Hemiplegia - Right/Left: Left Hemiplegia - dominant/non-dominant: Non-dominant Hemiplegia - caused by: Cerebral infarction     Time: 9622-2979 PT Time Calculation (min) (ACUTE ONLY): 20 min  Charges:  $Therapeutic Exercise: 8-22 mins                     Bonney Leitz , PTA Acute Rehabilitation Services Pager [phone] Office [phone]     Monserrate Blaschke Artis Delay 05/27/2020, 4:06 PM

## 2020-05-27 NOTE — Consult Note (Addendum)
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 History of severe COVID-19 infection with hypoxia pneumonia , lower extremity DVTs and stroke (pt unvaccinated) 2. 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 Hypertension  Plan:  1. Would use unasyn to cover MSSA + anerobes while in house and then transition to oral augmentin 875mg /125mg  as patient continues to improve and continue this for next 2 months with close followup CXR and followup with in ID, CT surgery and pulmonary 2. Please ensure that when she leaves the hospital she has a month of Augmentin minimum with a refill on it to stay on this until she has been seen in follow-up by above subspecialties 3. Will screen for HIV, HCV, HBV 4. She should receive COVID vaccination when timing is appropriate   Beverly Howell has an appointment on Beverly Howell, 2022 at 10:30 AM with Dr. 07-11-2000  [facility] is located in the [facility] at  [address] [place] in [place].  [address], which is located to the left of the elevators.  Phone: [phone]  Fax: [phone]  [email or web address]  She should arrive 15 minutes prior to her appointment.  We will sign off for now please call with further questions.  Principal Problem:   Necrotizing pneumonia (HCC) Active Problems:   Essential hypertension   History of CVA in adulthood   History of DVT of lower extremity   Scheduled Meds: . amLODipine  5 mg Oral Daily  . atorvastatin  40 mg Oral Daily  . enoxaparin (LOVENOX) injection  80 mg Subcutaneous Q12H  . influenza vaccine adjuvanted  0.5 mL Intramuscular  Tomorrow-1000  . ipratropium-albuterol  3 mL Nebulization BID  . mouth rinse  15 mL Mouth Rinse BID  . metoprolol tartrate  50 mg Oral BID  . mometasone-formoterol  2 puff Inhalation BID  . sodium chloride flush  10-40 mL Intracatheter Q12H   Continuous Infusions: .  ceFAZolin (ANCEF) IV 2 g (05/27/20 0905)   PRN Meds:.acetaminophen **OR** acetaminophen, oxyCODONE-acetaminophen, sodium chloride flush  HPI: Beverly Howell is a 79 y.o. female history of hypertension obesity who is unvaccinated against COVID-19 and admitted in November with a stroke bilateral DVTs and severe COVID pneumonia.  She ultimately recovered and was discharged from the hospital.  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  She is undergone T guided chest tube placement x2 by interventional radiology with purulent material obtained which is now grown methicillin sensitive Staph aureus.  She has been narrowed to vancomycin and cefepime and azithromycin to cefazolin.  She is not a candidate for cardiothoracic surgery and we have been consulted with regards to management of her severe necrotizing pneumonia with likely abscess and empyema and bronchopulmonary fistula.  I would switch her to Unasyn IV and when she improves ultimately change her over to Augmentin 875/125 twice daily.  As will cover MSSA plus any anaerobes that might be present.  It also offers the convenience of twice daily dosing as an oral  regimen.  I would plan on treating her for 2 months assuming there will be gradual resolution radiographically of her empyema and lung abscess.  We will need close follow-up with both our group cardiothoracic surgery and pulmonary and interventional radiology.       Review of Systems: Review of Systems  Constitutional: Negative  for chills, diaphoresis, fever, malaise/fatigue and weight loss.  HENT: Negative for congestion, hearing loss, sore throat and tinnitus.   Eyes: Negative for blurred vision and double vision.  Respiratory: Positive for cough, hemoptysis and shortness of breath. Negative for sputum production and wheezing.   Cardiovascular: Negative for chest pain, palpitations and leg swelling.  Gastrointestinal: Negative for abdominal pain, blood in stool, constipation, diarrhea, heartburn, melena, nausea and vomiting.  Genitourinary: Negative for dysuria, flank pain and hematuria.  Musculoskeletal: Positive for myalgias. Negative for back pain, falls and joint pain.  Skin: Negative for itching and rash.  Neurological: Negative for dizziness, sensory change, focal weakness, loss of consciousness, weakness and headaches.  Endo/Heme/Allergies: Does not bruise/bleed easily.  Psychiatric/Behavioral: Positive for depression. Negative for memory loss and suicidal ideas. The patient is not nervous/anxious.     Past Medical History:  Diagnosis Date  . High cholesterol   . Hypertension     Social History   Tobacco Use  . Smoking status: Former Games developermoker  . Smokeless tobacco: Never Used  Substance Use Topics  . Alcohol use: No  . Drug use: No    Family History  Problem Relation Age of Onset  . Stroke Mother    No Known Allergies  OBJECTIVE: Blood pressure 128/76, pulse 89, temperature 97.6 F (36.4 C), temperature source Oral, resp. rate 18, height 5\' 5"  (1.651 m), weight 81.5 kg, SpO2 100 %.  Physical Exam Constitutional:      General: She is not in acute distress.    Appearance: She is well-developed and well-nourished. She is not ill-appearing or diaphoretic.  HENT:     Head: Normocephalic and atraumatic.     Right Ear: Hearing and external ear normal.     Left Ear: Hearing and external ear normal.     Nose: No nasal deformity, rhinorrhea or epistaxis.  Eyes:     General: No scleral icterus.        Right eye: No discharge.        Left eye: No discharge.     Extraocular Movements: EOM normal.     Conjunctiva/sclera: Conjunctivae normal.     Right eye: Right conjunctiva is not injected.     Left eye: Left conjunctiva is not injected.  Neck:     Vascular: No JVD.  Cardiovascular:     Rate and Rhythm: Normal rate and regular rhythm.     Heart sounds: Normal heart sounds, S1 normal and S2 normal.  Pulmonary:     Effort: No respiratory distress.     Breath sounds: No wheezing.  Abdominal:     General: Bowel sounds are normal. There is no distension or ascites.     Palpations: Abdomen is soft. There is no hepatosplenomegaly.     Tenderness: There is no abdominal tenderness.  Musculoskeletal:        General: Normal range of motion.     Right shoulder: Normal.     Left shoulder: Normal.     Cervical back: Normal range of motion and neck supple.     Right hip: Normal.     Left hip: Normal.     Right knee: Normal.  Left knee: Normal.  Lymphadenopathy:     Head:     Right side of head: No submandibular, preauricular or posterior auricular adenopathy.     Left side of head: No submandibular, preauricular or posterior auricular adenopathy.     Cervical: No cervical adenopathy.     Right cervical: No superficial or deep cervical adenopathy.    Left cervical: No superficial or deep cervical adenopathy.  Skin:    General: Skin is warm, dry and intact.     Coloration: Skin is pale.     Findings: No abrasion, bruising, ecchymosis, erythema, lesion or rash.     Nails: There is no clubbing or cyanosis.  Neurological:     Mental Status: She is alert.     Deep Tendon Reflexes: Strength normal.  Psychiatric:        Attention and Perception: She is attentive.        Mood and Affect: Mood and affect normal.        Speech: Speech is delayed.        Behavior: Behavior is cooperative.        Thought Content: Thought content normal.        Cognition and Memory: Cognition and memory  normal.    Chest tubes in place  Lab Results Lab Results  Component Value Date   WBC 8.0 05/27/2020   HGB 10.7 (L) 05/27/2020   HCT 33.7 (L) 05/27/2020   MCV 97.4 05/27/2020   PLT 454 (H) 05/27/2020    Lab Results  Component Value Date   CREATININE 0.80 05/27/2020   BUN <5 (L) 05/27/2020   NA 141 05/27/2020   K 3.4 (L) 05/27/2020   CL 108 05/27/2020   CO2 24 05/27/2020    Lab Results  Component Value Date   ALT 11 05/27/2020   AST 29 05/27/2020   ALKPHOS 67 05/27/2020   BILITOT 0.4 05/27/2020     Microbiology: Recent Results (from the past 240 hour(s))  Blood culture (routine x 2)     Status: None   Collection Time: 05/20/20  1:14 AM   Specimen: BLOOD  Result Value Ref Range Status   Specimen Description BLOOD SITE NOT SPECIFIED  Final   Special Requests   Final    BOTTLES DRAWN AEROBIC AND ANAEROBIC Blood Culture adequate volume   Culture   Final    NO GROWTH 5 DAYS Performed at [facility], [address], [place], [place] [address]    Report Status 05/25/2020 FINAL  Final  Blood culture (routine x 2)     Status: None   Collection Time: 05/20/20  3:55 AM   Specimen: BLOOD  Result Value Ref Range Status   Specimen Description BLOOD A-LINE  Final   Special Requests   Final    BOTTLES DRAWN AEROBIC AND ANAEROBIC Blood Culture adequate volume   Culture   Final    NO GROWTH 5 DAYS Performed at [facility], [address] [place], [place] [address]    Report Status 05/25/2020 FINAL  Final  Aerobic/Anaerobic Culture (surgical/deep wound)     Status: None   Collection Time: 05/20/20  8:57 PM   Specimen: Abscess  Result Value Ref Range Status   Specimen Description ABSCESS RIGHT PLEURAL  Final   Special Requests SYRINGE  Final   Gram Stain   Final    ABUNDANT WBC PRESENT, PREDOMINANTLY PMN FEW GRAM POSITIVE COCCI IN PAIRS IN CLUSTERS    Culture   Final  RARE STAPHYLOCOCCUS AUREUS NO ANAEROBES ISOLATED Performed at [facility]  Lab, [address], [place], [place] [address]    Report Status 05/25/2020 FINAL  Final   Organism ID, Bacteria STAPHYLOCOCCUS AUREUS  Final      Susceptibility   Staphylococcus aureus - MIC*    CIPROFLOXACIN <=0.5 SENSITIVE Sensitive     ERYTHROMYCIN 0.5 SENSITIVE Sensitive     GENTAMICIN <=0.5 SENSITIVE Sensitive     OXACILLIN <=0.25 SENSITIVE Sensitive     TETRACYCLINE <=1 SENSITIVE Sensitive     VANCOMYCIN 1 SENSITIVE Sensitive     TRIMETH/SULFA <=10 SENSITIVE Sensitive     CLINDAMYCIN <=0.25 SENSITIVE Sensitive     RIFAMPIN <=0.5 SENSITIVE Sensitive     Inducible Clindamycin NEGATIVE Sensitive     * RARE STAPHYLOCOCCUS AUREUS    Acey Lav, MD [facility] [phone] pager  05/27/2020, 1:51 PM

## 2020-05-27 NOTE — Progress Notes (Signed)
PROGRESS NOTE        PATIENT DETAILS Name: Beverly FractionYvonne Howell Age: 79 y.o. Sex: female Date of Birth: 06/10/1941 Admit Date: 05/19/2020 Admitting Physician Eduard ClosArshad N Kakrakandy, MD ZOX:WRUEAVPCP:System, Provider Not In  Brief Narrative: Patient is a 79 y.o. female with history of recent severe COVID-19 infection-along with CVA/lower extremity DVT-presenting to the hospital with shortness of breath-found to have necrotizing right-sided pneumonia with loculated hydropneumothorax.  See below for further details.  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  Multifocal pneumonia.  Antimicrobial therapy: Ancef:12/10>> Vancomycin: 12/6>>12/10 Cefepime: 12/6>>12/10 Zithromax: 12/6>> 12/8  Microbiology data: 12/7>> blood cultures: No growth 12/7>> right pleural fluid culture: MSSA  Procedures : 12/7>> CT-guided chest tube placement x2 by interventional radiology  Consults: CT surgery, IR, ID.  DVT Prophylaxis : IV heparin>>SQ therapeutic Lovenox   Subjective: No major issues overnight-lying comfortably in bed.  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  Await further input from CT surgery.  Have consulted ID today.  Hypokalemia: Continue to replete and recheck.  History of recent right MCA CVA with left-sided weakness: Thought to  be provoked due to COVID-19 hypercoagulable state-minimal left-sided weakness persists.  Remains on therapeutic Lovenox-once further stable-not felt to require any further procedures-we can transition to oral anticoagulation.    Recent history of bilateral lower extremity DVT: Thought to be provoked due to COVID-19 hypercoagulable state-currently on therapeutic anticoagulation  HTN: BP controlled-continue amlodipine.  Recent COVID-19 infection with severe hypoxemia: Does not require any further isolation as > 3 weeks out from her initial presentation.   Diet: Diet Order            Diet Heart Room service appropriate? Yes; Fluid consistency: Thin  Diet effective now                  Code Status: Full code   Family Communication: Daughter [phone]([phone])-on 12/14  Disposition Plan: Status is: Inpatient  Remains inpatient appropriate because:Inpatient level of care appropriate due to severity of illness   Dispo: The patient is from: Home              Anticipated d/c is to: TBD              Anticipated d/c date is: > 3 days              Patient currently is not medically stable to d/c.   Barriers to Discharge: MSSA pneumonia with empyema-on IV antibiotics-chest tube in place  Antimicrobial agents: Anti-infectives (From admission, onward)   Start     Dose/Rate Route Frequency Ordered Stop   05/23/20 1600  ceFAZolin (ANCEF) IVPB 2g/100 mL premix        2 g 200 mL/hr over 30 Minutes Intravenous Every 8 hours 05/23/20 1414     05/23/20 0300  vancomycin (VANCOREADY) IVPB 750 mg/150 mL  Status:  Discontinued  750 mg 150 mL/hr over 60 Minutes Intravenous Every 12 hours 05/22/20 1149 05/23/20 1356   05/23/20 0100  vancomycin (VANCOREADY) IVPB 750 mg/150 mL  Status:  Discontinued        750 mg 150 mL/hr over 60 Minutes Intravenous Every 12 hours 05/22/20 1146 05/22/20 1149   05/20/20 2200  vancomycin (VANCOCIN) IVPB 1000 mg/200 mL premix  Status:  Discontinued         1,000 mg 200 mL/hr over 60 Minutes Intravenous Every 12 hours 05/20/20 1401 05/22/20 1146   05/20/20 1000  ceFEPIme (MAXIPIME) 2 g in sodium chloride 0.9 % 100 mL IVPB  Status:  Discontinued        2 g 200 mL/hr over 30 Minutes Intravenous Every 8 hours 05/20/20 0226 05/23/20 1356   05/20/20 1000  vancomycin (VANCOREADY) IVPB 500 mg/100 mL  Status:  Discontinued        500 mg 100 mL/hr over 60 Minutes Intravenous Every 12 hours 05/20/20 0240 05/20/20 1401   05/20/20 0230  azithromycin (ZITHROMAX) 500 mg in sodium chloride 0.9 % 250 mL IVPB  Status:  Discontinued        500 mg 250 mL/hr over 60 Minutes Intravenous Daily at bedtime 05/20/20 0226 05/21/20 1443   05/20/20 0100  vancomycin (VANCOCIN) IVPB 1000 mg/200 mL premix        1,000 mg 200 mL/hr over 60 Minutes Intravenous  Once 05/20/20 0046 05/20/20 0402   05/20/20 0045  ceFEPIme (MAXIPIME) 2 g in sodium chloride 0.9 % 100 mL IVPB        2 g 200 mL/hr over 30 Minutes Intravenous  Once 05/20/20 0030 05/20/20 0221       Time spent: 15-minutes-Greater than 50% of this time was spent in counseling, explanation of diagnosis, planning of further management, and coordination of care.  MEDICATIONS: Scheduled Meds:  amLODipine  5 mg Oral Daily   atorvastatin  40 mg Oral Daily   enoxaparin (LOVENOX) injection  80 mg Subcutaneous Q12H   influenza vaccine adjuvanted  0.5 mL Intramuscular Tomorrow-1000   ipratropium-albuterol  3 mL Nebulization BID   mouth rinse  15 mL Mouth Rinse BID   metoprolol tartrate  50 mg Oral BID   mometasone-formoterol  2 puff Inhalation BID   sodium chloride flush  10-40 mL Intracatheter Q12H   Continuous Infusions:   ceFAZolin (ANCEF) IV 2 g (05/27/20 0905)   PRN Meds:.acetaminophen **OR** acetaminophen, oxyCODONE-acetaminophen, sodium chloride flush   PHYSICAL EXAM: Vital signs: Vitals:   05/27/20 0026 05/27/20 0451 05/27/20 0801 05/27/20 0915  BP: (!) 152/82 (!) 158/76 (!) 159/76   Pulse:  94 74 91   Resp: (!) 25 20 19    Temp: 97.6 F (36.4 C) 97.7 F (36.5 C) (!) 97.4 F (36.3 C)   TempSrc: Oral Oral Oral   SpO2: 96% 97% 97% 98%  Weight:      Height:       Filed Weights   05/20/20 1330 05/21/20 0028  Weight: 98 kg 81.5 kg   Body mass index is 29.9 kg/m.   Gen Exam:Alert awake-not in any distress HEENT:atraumatic, normocephalic Chest: B/L clear to auscultation anteriorly CVS:S1S2 regular Abdomen:soft non tender, non distended Extremities:no edema Neurology: Non focal Skin: no rash  I have personally reviewed following labs and imaging studies  LABORATORY DATA: CBC: Recent Labs  Lab 05/23/20 1033 05/24/20 0425 05/25/20 0213 05/26/20 0842 05/27/20 0148  WBC 9.6 8.1 9.2 9.8 8.0  HGB 9.7* 9.1* 10.2* 10.5* 10.7*  HCT  29.9* 27.9* 33.2* 33.0* 33.7*  MCV 96.5 96.9 97.9 97.9 97.4  PLT 442* 429* 432* 457* 454*    Basic Metabolic Panel: Recent Labs  Lab 05/23/20 1033 05/24/20 0425 05/25/20 0213 05/26/20 0842 05/27/20 0148  NA 143 141 140 141 141  K 3.9 2.8* 3.6 4.1 3.4*  CL 110 108 107 107 108  CO2 22 25 24  20* 24  GLUCOSE 93 81 97 87 89  BUN 7* 6* 7* 6* <5*  CREATININE 0.82 0.86 0.87 0.74 0.80  CALCIUM 8.3* 8.1* 8.4* 8.5* 8.5*  MG  --   --  2.1 1.8  --     GFR: Estimated Creatinine Clearance: 61.1 mL/min (by C-G formula based on SCr of 0.8 mg/dL).  Liver Function Tests: Recent Labs  Lab 05/23/20 1033 05/24/20 0425 05/25/20 0213 05/26/20 0842 05/27/20 0148  AST 18 18 19 26 29   ALT 15 16 14 11 11   ALKPHOS 55 51 66 70 67  BILITOT 0.8 0.6 0.4 0.7 0.4  PROT 5.3* 5.0* 5.7* 5.9* 6.2*  ALBUMIN 1.8* 1.7* 1.9* 2.1* 2.1*   No results for input(s): LIPASE, AMYLASE in the last 168 hours. No results for input(s): AMMONIA in the last 168 hours.  Coagulation Profile: No results for input(s): INR, PROTIME in the last 168 hours.  Cardiac Enzymes: No results for input(s): CKTOTAL, CKMB, CKMBINDEX, TROPONINI in the last 168 hours.  BNP (last  3 results) No results for input(s): PROBNP in the last 8760 hours.  Lipid Profile: No results for input(s): CHOL, HDL, LDLCALC, TRIG, CHOLHDL, LDLDIRECT in the last 72 hours.  Thyroid Function Tests: No results for input(s): TSH, T4TOTAL, FREET4, T3FREE, THYROIDAB in the last 72 hours.  Anemia Panel: No results for input(s): VITAMINB12, FOLATE, FERRITIN, TIBC, IRON, RETICCTPCT in the last 72 hours.  Urine analysis:    Component Value Date/Time   COLORURINE AMBER (A) 04/22/2020 1205   APPEARANCEUR HAZY (A) 04/22/2020 1205   LABSPEC 1.026 04/22/2020 1205   PHURINE 5.0 04/22/2020 1205   GLUCOSEU NEGATIVE 04/22/2020 1205   HGBUR NEGATIVE 04/22/2020 1205   BILIRUBINUR NEGATIVE 04/22/2020 1205   KETONESUR 5 (A) 04/22/2020 1205   PROTEINUR 30 (A) 04/22/2020 1205   UROBILINOGEN 0.2 02/03/2014 2029   NITRITE NEGATIVE 04/22/2020 1205   LEUKOCYTESUR NEGATIVE 04/22/2020 1205    Sepsis Labs: Lactic Acid, Venous    Component Value Date/Time   LATICACIDVEN 1.6 05/19/2020 2309    MICROBIOLOGY: Recent Results (from the past 240 hour(s))  Blood culture (routine x 2)     Status: None   Collection Time: 05/20/20  1:14 AM   Specimen: BLOOD  Result Value Ref Range Status   Specimen Description BLOOD SITE NOT SPECIFIED  Final   Special Requests   Final    BOTTLES DRAWN AEROBIC AND ANAEROBIC Blood Culture adequate volume   Culture   Final    NO GROWTH 5 DAYS Performed at [facility], [address] [place], [facility] [address]    Report Status 05/25/2020 FINAL  Final  Blood culture (routine x 2)     Status: None   Collection Time: 05/20/20  3:55 AM   Specimen: BLOOD  Result Value Ref Range Status   Specimen Description BLOOD A-LINE  Final   Special Requests   Final    BOTTLES DRAWN AEROBIC AND ANAEROBIC Blood Culture adequate volume   Culture   Final    NO GROWTH 5 DAYS Performed at [facility], [address], [place],  [place] [address]    Report Status 05/25/2020  FINAL  Final  Aerobic/Anaerobic Culture (surgical/deep wound)     Status: None   Collection Time: 05/20/20  8:57 PM   Specimen: Abscess  Result Value Ref Range Status   Specimen Description ABSCESS RIGHT PLEURAL  Final   Special Requests SYRINGE  Final   Gram Stain   Final    ABUNDANT WBC PRESENT, PREDOMINANTLY PMN FEW GRAM POSITIVE COCCI IN PAIRS IN CLUSTERS    Culture   Final    RARE STAPHYLOCOCCUS AUREUS NO ANAEROBES ISOLATED Performed at [facility], [address] [place], [place] [address]    Report Status 05/25/2020 FINAL  Final   Organism ID, Bacteria STAPHYLOCOCCUS AUREUS  Final      Susceptibility   Staphylococcus aureus - MIC*    CIPROFLOXACIN <=0.5 SENSITIVE Sensitive     ERYTHROMYCIN 0.5 SENSITIVE Sensitive     GENTAMICIN <=0.5 SENSITIVE Sensitive     OXACILLIN <=0.25 SENSITIVE Sensitive     TETRACYCLINE <=1 SENSITIVE Sensitive     VANCOMYCIN 1 SENSITIVE Sensitive     TRIMETH/SULFA <=10 SENSITIVE Sensitive     CLINDAMYCIN <=0.25 SENSITIVE Sensitive     RIFAMPIN <=0.5 SENSITIVE Sensitive     Inducible Clindamycin NEGATIVE Sensitive     * RARE STAPHYLOCOCCUS AUREUS    RADIOLOGY STUDIES/RESULTS: DG CHEST PORT 1 VIEW  Result Date: 05/27/2020 CLINICAL DATA:  Pleural effusion.  Pneumothorax.  Chest tube. EXAM: PORTABLE CHEST 1 VIEW COMPARISON:  05/26/2020. FINDINGS: Two right chest tubes in stable position. Stable small right pneumothorax. Stable right-sided pleural thickening. Persistent bibasilar atelectasis/infiltrates. Heart size stable. Severe degenerative changes cervicothoracic spine and both shoulders. Mild right chest wall subcutaneous emphysema again noted. IMPRESSION: 1. Two right chest tubes in stable position. Stable small right pneumothorax. Stable right-sided pleural thickening. 2. Persistent bibasilar atelectasis/infiltrates. Electronically Signed   By: Maisie Fus  Register   On: 05/27/2020 05:45   DG Chest Port 1 View  Result Date:  05/26/2020 CLINICAL DATA:  Chest tube. EXAM: PORTABLE CHEST 1 VIEW COMPARISON:  05/24/2020 FINDINGS: The cardiomediastinal silhouette is unchanged. 2 right-sided chest tubes remain in place. There has been interval retraction of the more inferiorly positioned chest tube which now terminates over the mid to lateral aspect of the right lung base. A small right-sided pneumothorax is again noted laterally and has not significantly changed in size. Airspace opacity in the right mid and lower lung and a small right pleural effusion are unchanged, as are mild left basilar airspace opacities. A small amount of subcutaneous emphysema in the right lateral chest wall/axilla has decreased. IMPRESSION: 1. Interval retraction of the more inferiorly positioned right chest tube. Unchanged small right pneumothorax. 2. Unchanged right greater than left basilar airspace disease and small right pleural effusion. Electronically Signed   By: Sebastian Ache M.D.   On: 05/26/2020 07:42     LOS: 7 days   Jeoffrey Massed, MD  [facility]    To contact the attending provider between 7A-7P or the covering provider during after hours 7P-7A, please log into the web site [email or web address] and access using universal [place] password for that web site. If you do not have the password, please call the hospital operator.  05/27/2020, 10:56 AM

## 2020-05-28 ENCOUNTER — Inpatient Hospital Stay (HOSPITAL_COMMUNITY): Payer: Medicare (Managed Care)

## 2020-05-28 DIAGNOSIS — Z9689 Presence of other specified functional implants: Secondary | ICD-10-CM

## 2020-05-28 LAB — BASIC METABOLIC PANEL
Anion gap: 10 (ref 5–15)
BUN: 7 mg/dL — ABNORMAL LOW (ref 8–23)
CO2: 22 mmol/L (ref 22–32)
Calcium: 8.5 mg/dL — ABNORMAL LOW (ref 8.9–10.3)
Chloride: 111 mmol/L (ref 98–111)
Creatinine, Ser: 0.81 mg/dL (ref 0.44–1.00)
GFR, Estimated: >60 mL/min (ref >60)
Glucose, Bld: 88 mg/dL (ref 70–99)
Potassium: 4.1 mmol/L (ref 3.5–5.1)
Sodium: 143 mmol/L (ref 135–145)

## 2020-05-28 LAB — CBC
HCT: 30.5 % — ABNORMAL LOW (ref 36.0–46.0)
Hemoglobin: 9.3 g/dL — ABNORMAL LOW (ref 12.0–15.0)
MCH: 30.2 pg (ref 26.0–34.0)
MCHC: 30.5 g/dL (ref 30.0–36.0)
MCV: 99 fL (ref 80.0–100.0)
Platelets: 352 10*3/uL (ref 150–400)
RBC: 3.08 MIL/uL — ABNORMAL LOW (ref 3.87–5.11)
RDW: 16.9 % — ABNORMAL HIGH (ref 11.5–15.5)
WBC: 8.9 10*3/uL (ref 4.0–10.5)
nRBC: 0 % (ref 0.0–0.2)

## 2020-05-28 LAB — HEPATITIS C ANTIBODY: HCV Ab: NONREACTIVE

## 2020-05-28 LAB — MAGNESIUM: Magnesium: 1.7 mg/dL (ref 1.7–2.4)

## 2020-05-28 LAB — HEPATITIS B SURFACE ANTIGEN: Hepatitis B Surface Ag: NONREACTIVE

## 2020-05-28 NOTE — Progress Notes (Addendum)
      [address]       [name] [address]             [phone]           Subjective: Patient states she had a good day yesterday. She has no breathing complaints this am.  Objective: Vital signs in last 24 hours: Temp:  [97.4 F (36.3 C)-98.2 F (36.8 C)] 98.2 F (36.8 C) (12/15 0409) Pulse Rate:  [84-100] 86 (12/15 0409) Cardiac Rhythm: Normal sinus rhythm;Bundle branch block (12/14 1900) Resp:  [17-20] 17 (12/15 0409) BP: (128-159)/(64-85) 157/85 (12/15 0409) SpO2:  [97 %-100 %] 98 % (12/15 0409) FiO2 (%):  [21 %] 21 % (12/14 0915)     Intake/Output from previous day: 12/14 0701 - 12/15 0700 In: 880 [P.O.:580; IV Piggyback:300] Out: 120 [Chest Tube:120]   Physical Exam:  Cardiovascular: RRR Pulmonary: Clear on the left and audible squeak on the right (chest tubes) Wounds: Dressing is clean and dry.   Chest Tubes: to suction. Pigtail #1 with no air leak and pigtail #2 has had small air leak.  Lab Results: CBC: Recent Labs    05/27/20 0148 05/28/20 0135  WBC 8.0 8.9  HGB 10.7* 9.3*  HCT 33.7* 30.5*  PLT 454* 352   BMET:  Recent Labs    05/27/20 0148 05/28/20 0135  NA 141 143  K 3.4* 4.1  CL 108 111  CO2 24 22  GLUCOSE 89 88  BUN <5* 7*  CREATININE 0.80 0.81  CALCIUM 8.5* 8.5*    PT/INR: No results for input(s): LABPROT, INR in the last 72 hours. ABG:  INR: Will add last result for INR, ABG once components are confirmed Will add last 4 CBG results once components are confirmed  Assessment/Plan:  1. CV - SR with HR in the 70's. Hypertensive. On Amlodipine 5 mg daily and Lopressor 50 mg bid;per primary 2.  Pulmonary - On room air. Chest tubes with 120 cc (total for both) for 24 hours . Chest tubes to suction. There is no air leak from pigtail #1 and continued, small air leak in pigtail #2. CXR this am appears to show stable,right lateral pneumothorax and right pleural effusion.  Chest tubes to remain for now. Check CXR in  am. Patient not a surgical candidate. 3. History of CVA, recent DVT-on Lovenox 80 mg [place] bid 4. ID-on Cefazolin for MSSA (previously treated with Vanco and Cefepime)necrotizing PNA, right empyema. Per infectious disease, transition to oral Augmentin once clinically improved and this is to be continued for 2 months.  Donielle M ZimmermanPA-C 05/28/2020,7:01 AM [phone]  I have seen and examined Beverly Howell and agree with the above assessment  and plan.  [name] MD Bee[facility] [phone] Office [phone] 05/28/2020 2:48 PM

## 2020-05-28 NOTE — Progress Notes (Signed)
PROGRESS NOTE    Beverly Howell  ZOX:[id number] DOB: Nov 25, 1940 DOA: 05/19/2020 PCP: System, Provider Not In    Chief Complaint  Patient presents with  . Shortness of Breath  . Hematemesis    Brief Narrative: 79 year old lady with prior history of severe COVID-19 infection, lower extremity DVT, CVA presents to the hospital with worsening shortness of breath.  She was found to have necrotizing right-sided pneumonia with loculated hydropneumothorax.  She was initially hospitalized from 11/8-11/15 for left-sided weakness due to CVA COVID-19 pneumonia and bilateral lower extremity DVT.  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  CT surgery consulted/ID on board.  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 Pleural fluid cultures positive for MSSA.  ID on board recommended transitioning to Unasyn and when significantly improved and on discharge to transition to oral Augmentin for 2 months duration until she sees CT surgery/pulmonary.     History of recent right MCA CVA with left-sided weakness probably secondary to hypercoagulable state from COVID-19 infection. Currently on therapeutic Lovenox, can transition to oral anticoagulation on discharge.    History of lower extremity DVT probably secondary to COVID-19 hypercoagulable state currently on therapeutic anticoagulation Hypertension Optimal blood pressures    Recent COVID-19 infection Does not require any isolation as she has been more than 3 weeks out from her initial presentation   DVT prophylaxis: Lovenox Code Status: (Full code Family Communication: None at  bedside Disposition:   Status is: Inpatient  Remains inpatient appropriate because:Unsafe d/c plan, IV treatments appropriate due to intensity of illness or inability to take PO and Inpatient level of care appropriate due to severity of illness   Dispo: The patient is from: Home              Anticipated d/c is to: Pending              Anticipated d/c date is: 2 days              Patient currently is not medically stable to d/c.       Consultants:   Cardiothoracic surgery  Infectious disease  IR  Procedures: CT-guided chest tube placement by interventional radiology Antimicrobials: Unasyn  Subjective: No new complaints today  Objective: Vitals:   05/28/20 0409 05/28/20 0801 05/28/20 0807 05/28/20 1322  BP: (!) 157/85 (!) 143/65  138/78  Pulse: 86 64  65  Resp: 17 20  20   Temp: 98.2 F (36.8 C) 98 F (36.7 C)  98.1 F (36.7 C)  TempSrc: Oral Oral  Oral  SpO2: 98% 97% 93% 93%  Weight:      Height:        Intake/Output Summary (Last 24 hours) at 05/28/2020 1529 Last data filed at 05/28/2020 1436 Gross per 24 hour  Intake 1120 ml  Output 650 ml  Net 470 ml   Filed Weights   05/20/20 1330 05/21/20 0028  Weight: 98 kg 81.5 kg    Examination:  General exam: Appears calm and comfortable  Respiratory system: Diminished air entry at bases Cardiovascular system: S1 & S2 heard, regular rate no pedal edema. Gastrointestinal system: Abdomen is nondistended, soft and nontender. Normal  bowel sounds heard. Central nervous system: Alert and oriented. No focal neurological deficits. Extremities: Symmetric 5 x 5 power. Skin: No rashes, lesions or ulcers Psychiatry: Mood & affect appropriate.     Data Reviewed: I have personally reviewed following labs and imaging studies  CBC: Recent Labs  Lab 05/24/20 0425 05/25/20 0213 05/26/20 0842 05/27/20 0148 05/28/20 0135  WBC 8.1 9.2 9.8 8.0 8.9  HGB 9.1* 10.2* 10.5* 10.7* 9.3*  HCT 27.9* 33.2* 33.0* 33.7* 30.5*   MCV 96.9 97.9 97.9 97.4 99.0  PLT 429* 432* 457* 454* 352    Basic Metabolic Panel: Recent Labs  Lab 05/24/20 0425 05/25/20 0213 05/26/20 0842 05/27/20 0148 05/28/20 0135  NA 141 140 141 141 143  K 2.8* 3.6 4.1 3.4* 4.1  CL 108 107 107 108 111  CO2 25 24 20* 24 22  GLUCOSE 81 97 87 89 88  BUN 6* 7* 6* <5* 7*  CREATININE 0.86 0.87 0.74 0.80 0.81  CALCIUM 8.1* 8.4* 8.5* 8.5* 8.5*  MG  --  2.1 1.8  --  1.7    GFR: Estimated Creatinine Clearance: 60.4 mL/min (by C-G formula based on SCr of 0.81 mg/dL).  Liver Function Tests: Recent Labs  Lab 05/23/20 1033 05/24/20 0425 05/25/20 0213 05/26/20 0842 05/27/20 0148  AST 18 18 19 26 29   ALT 15 16 14 11 11   ALKPHOS 55 51 66 70 67  BILITOT 0.8 0.6 0.4 0.7 0.4  PROT 5.3* 5.0* 5.7* 5.9* 6.2*  ALBUMIN 1.8* 1.7* 1.9* 2.1* 2.1*    CBG: No results for input(s): GLUCAP in the last 168 hours.   Recent Results (from the past 240 hour(s))  Blood culture (routine x 2)     Status: None   Collection Time: 05/20/20  1:14 AM   Specimen: BLOOD  Result Value Ref Range Status   Specimen Description BLOOD SITE NOT SPECIFIED  Final   Special Requests   Final    BOTTLES DRAWN AEROBIC AND ANAEROBIC Blood Culture adequate volume   Culture   Final    NO GROWTH 5 DAYS Performed at [facility], [address], [place], [address] [place]    Report Status 05/25/2020 FINAL  Final  Blood culture (routine x 2)     Status: None   Collection Time: 05/20/20  3:55 AM   Specimen: BLOOD  Result Value Ref Range Status   Specimen Description BLOOD A-LINE  Final   Special Requests   Final    BOTTLES DRAWN AEROBIC AND ANAEROBIC Blood Culture adequate volume   Culture   Final    NO GROWTH 5 DAYS Performed at [facility], [address] [place], [address] [place]    Report Status 05/25/2020 FINAL  Final  Aerobic/Anaerobic Culture (surgical/deep wound)     Status: None   Collection Time: 05/20/20  8:57 PM   Specimen: Abscess   Result Value Ref Range Status   Specimen Description ABSCESS RIGHT PLEURAL  Final   Special Requests SYRINGE  Final   Gram Stain   Final    ABUNDANT WBC PRESENT, PREDOMINANTLY PMN FEW GRAM POSITIVE COCCI IN PAIRS IN CLUSTERS    Culture   Final    RARE STAPHYLOCOCCUS AUREUS NO ANAEROBES ISOLATED Performed at [facility], [address], [place], [address] [place]    Report Status 05/25/2020 FINAL  Final   Organism ID, Bacteria STAPHYLOCOCCUS AUREUS  Final      Susceptibility   Staphylococcus aureus - MIC*  CIPROFLOXACIN <=0.5 SENSITIVE Sensitive     ERYTHROMYCIN 0.5 SENSITIVE Sensitive     GENTAMICIN <=0.5 SENSITIVE Sensitive     OXACILLIN <=0.25 SENSITIVE Sensitive     TETRACYCLINE <=1 SENSITIVE Sensitive     VANCOMYCIN 1 SENSITIVE Sensitive     TRIMETH/SULFA <=10 SENSITIVE Sensitive     CLINDAMYCIN <=0.25 SENSITIVE Sensitive     RIFAMPIN <=0.5 SENSITIVE Sensitive     Inducible Clindamycin NEGATIVE Sensitive     * RARE STAPHYLOCOCCUS AUREUS         Radiology Studies: DG CHEST PORT 1 VIEW  Result Date: 05/28/2020 CLINICAL DATA:  79 year old female with recent COVID-19. Diagnosis. Right chest loculated hydropneumothorax treated with chest tube. EXAM: PORTABLE CHEST 1 VIEW COMPARISON:  Portable chest 05/27/2020 and earlier. FINDINGS: Portable AP semi upright view at 0531 hours. Stable two right chest pigtail tubes. Lateral pleural thickening redemonstrated. Decreased component of pleural gas since yesterday. Slightly increased right chest wall subcutaneous gas. Stable lung volumes. Patchy and confluent right lung base opacity stable since 05/26/2020. Stable patchy opacity in the left mid and lower lung. Stable cardiac size and mediastinal contours. Visualized tracheal air column is within normal limits. Negative visible bowel gas pattern. Stable visualized osseous structures. IMPRESSION: 1. Stable right chest tubes. Decreased right pleural gas, trace residual. Underlying  right pleural thickening. 2. Otherwise stable ventilation with bilateral COVID-19 pneumonia. Electronically Signed   By: Odessa Fleming M.D.   On: 05/28/2020 07:39   DG CHEST PORT 1 VIEW  Result Date: 05/27/2020 CLINICAL DATA:  Pleural effusion.  Pneumothorax.  Chest tube. EXAM: PORTABLE CHEST 1 VIEW COMPARISON:  05/26/2020. FINDINGS: Two right chest tubes in stable position. Stable small right pneumothorax. Stable right-sided pleural thickening. Persistent bibasilar atelectasis/infiltrates. Heart size stable. Severe degenerative changes cervicothoracic spine and both shoulders. Mild right chest wall subcutaneous emphysema again noted. IMPRESSION: 1. Two right chest tubes in stable position. Stable small right pneumothorax. Stable right-sided pleural thickening. 2. Persistent bibasilar atelectasis/infiltrates. Electronically Signed   By: Maisie Fus  Register   On: 05/27/2020 05:45        Scheduled Meds: . amLODipine  5 mg Oral Daily  . atorvastatin  40 mg Oral Daily  . enoxaparin (LOVENOX) injection  80 mg Subcutaneous Q12H  . influenza vaccine adjuvanted  0.5 mL Intramuscular Tomorrow-1000  . ipratropium-albuterol  3 mL Nebulization BID  . mouth rinse  15 mL Mouth Rinse BID  . metoprolol tartrate  50 mg Oral BID  . mometasone-formoterol  2 puff Inhalation BID  . sodium chloride flush  10-40 mL Intracatheter Q12H   Continuous Infusions: . ampicillin-sulbactam (UNASYN) IV 3 g (05/28/20 1019)     LOS: 8 days        Kathlen Mody, MD [facility]   To contact the attending provider between 7A-7P or the covering provider during after hours 7P-7A, please log into the web site [email or web address] and access using universal [place] password for that web site. If you do not have the password, please call the hospital operator.  05/28/2020, 3:29 PM

## 2020-05-29 ENCOUNTER — Inpatient Hospital Stay (HOSPITAL_COMMUNITY): Payer: Medicare (Managed Care)

## 2020-05-29 NOTE — Progress Notes (Addendum)
[address]       Beverly Howell [address]             [phone]           Subjective: Patient without complaint this am.  Objective: Vital signs in last 24 hours: Temp:  [97.8 F (36.6 C)-98.1 F (36.7 C)] 97.8 F (36.6 C) (12/16 0355) Pulse Rate:  [52-94] 85 (12/16 0355) Cardiac Rhythm: Normal sinus rhythm;Bundle branch block (12/15 1901) Resp:  [16-20] 16 (12/16 0355) BP: (131-153)/(65-90) 131/68 (12/16 0355) SpO2:  [93 %-97 %] 97 % (12/16 0355)     Intake/Output from previous day: 12/15 0701 - 12/16 0700 In: 909.3 [P.O.:480; IV Piggyback:429.3] Out: 1220 [Urine:1150; Chest Tube:70]   Physical Exam:  Cardiovascular: Slightly tachycardic Pulmonary: Clear on the left and audible squeak on the right (chest tubes) Wounds: Dressing is clean and dry.   Chest Tubes: to suction. Pigtail #1 with small, intermittent air leak and pigtail #2 has had small air leak.  Lab Results: CBC: Recent Labs    05/27/20 0148 05/28/20 0135  WBC 8.0 8.9  HGB 10.7* 9.3*  HCT 33.7* 30.5*  PLT 454* 352   BMET:  Recent Labs    05/27/20 0148 05/28/20 0135  NA 141 143  K 3.4* 4.1  CL 108 111  CO2 24 22  GLUCOSE 89 88  BUN <5* 7*  CREATININE 0.80 0.81  CALCIUM 8.5* 8.5*    PT/INR: No results for input(s): LABPROT, INR in the last 72 hours. ABG:  INR: Will add last result for INR, ABG once components are confirmed Will add last 4 CBG results once components are confirmed  Assessment/Plan:  1. CV - Slightly tachy with HR in the low 100's.  On Amlodipine 5 mg daily and Lopressor 50 mg bid;per primary 2.  Pulmonary - On room air. Chest tubes with 70 cc (total for both) for 24 hours . Chest tubes to suction. There is a small intermittent air leak from pigtail #1 and continued, small air leak in pigtail #2. CXR this am appears to show increased,right lateral pneumothorax and stable right pleural effusion.  Chest tubes to remain for now. Check CXR in am.  Patient not a surgical candidate. 3. History of CVA, recent DVT-on Lovenox 80 mg Wade bid 4. ID-on Cefazolin for MSSA (previously treated with Vanco and Cefepime)necrotizing PNA, right empyema. Per infectious disease, transition to oral Augmentin once clinically improved and this is to be continued for 2 months.  Lelon Huh ZimmermanPA-C 05/29/2020,7:09 AM [phone]  DG Chest Port 1 View  Result Date: 05/29/2020 CLINICAL DATA:  Pneumothorax with chest tubes in place EXAM: PORTABLE CHEST 1 VIEW COMPARISON:  May 28, 2020 FINDINGS: Chest tubes are unchanged in position on the right. There is a pneumothorax in the inferolateral right base region, slightly larger compared to 1 day prior without tension component. There is subcutaneous air on the right. There is a small right pleural effusion with right base atelectatic change. There is an area of ill-defined opacity in the left base, similar to 1 day prior. Heart size and pulmonary vascularity are normal. No adenopathy. Extensive arthropathy in each shoulder noted. IMPRESSION: Chest tubes unchanged on the right with slightly larger lateral right basilar pneumothorax. No tension component. There is a small right pleural effusion with atelectatic change in the right base. There is subcutaneous air on the right. Question atelectasis versus focal area of infiltrate left base. Left lung otherwise clear. Heart size  within normal limits. Electronically Signed   By: Bretta Bang III M.D.   On: 05/29/2020 08:22   Will consider talc pleurodesis if air leak persists   I have seen and examined Beverly Howell and agree with the above assessment  and plan.  [name] MD Beeper [phone] Office [phone] 05/29/2020 10:26 AM

## 2020-05-29 NOTE — Progress Notes (Signed)
PROGRESS NOTE    Beverly Howell  NWG:[id number] DOB: 06/26/1940 DOA: 05/19/2020 PCP: System, Provider Not In    Chief Complaint  Patient presents with  . Shortness of Breath  . Hematemesis    Brief Narrative: 79 year old lady with prior history of severe COVID-19 infection, lower extremity DVT, CVA presents to the hospital with worsening shortness of breath.  She was found to have necrotizing right-sided pneumonia with loculated hydropneumothorax.  She was initially hospitalized from 11/8-11/15 for left-sided weakness due to CVA COVID-19 pneumonia and bilateral lower extremity DVT.  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  CT surgery consulted/ID on board.  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 Pleural fluid cultures positive for MSSA.  ID on board recommended transitioning to Unasyn and when significantly improved and on discharge to transition to oral Augmentin for 2 months duration until she sees CT surgery/pulmonary.  S/p chest tubes to suction, small air leak in pigtail #1 and 2. CXR this am shows increased right lateral pneumothorax and stable right pleural effusion.  Chest tube care as per cardiothoracic surgery.      History of recent right MCA CVA with left-sided weakness probably secondary to hypercoagulable state from COVID-19 infection. Currently on therapeutic Lovenox, can transition to oral anticoagulation on discharge. Continue with lipitor.     History of lower extremity DVT probably secondary to COVID-19 hypercoagulable state currently on therapeutic anticoagulation   Hypertension Well  controlled BP parameters.    Anemia of chronic disease:  Hemoglobin stable between 9 to 10.    Hypokalemia:  Replaced.     Recent COVID-19 infection Does not require any isolation as she has been more than 3 weeks out from her initial presentation  Therapy evaluations recommending SNF with 24 hours supervision.    DVT prophylaxis: Lovenox Code Status: (Full code Family Communication: None at bedside, called daughter and updated on 05/29/20 Disposition:   Status is: Inpatient  Remains inpatient appropriate because:Unsafe d/c plan, IV treatments appropriate due to intensity of illness or inability to take PO and Inpatient level of care appropriate due to severity of illness  Chest tubes in place.    Dispo: The patient is from: Home              Anticipated d/c is to: SNF              Anticipated d/c date is: > 3 days              Patient currently is not medically stable to d/c.       Consultants:   Cardiothoracic surgery  Infectious disease  IR  Procedures: CT-guided chest tube placement by interventional radiology Antimicrobials: Unasyn  Subjective: No chest pain or sob at rest.   Objective: Vitals:   05/28/20 2349 05/29/20 0355 05/29/20 0725 05/29/20 0735  BP: (!) 147/71 131/68  (!) 141/78  Pulse: 94 85 (!) 101 100  Resp: 19 16 (!) 21 18  Temp: 98.1 F (36.7 C) 97.8 F (36.6 C)  98 F (36.7 C)  TempSrc: Oral Oral  Oral  SpO2: 95% 97% 95%   Weight:  Height:        Intake/Output Summary (Last 24 hours) at 05/29/2020 1053 Last data filed at 05/29/2020 0646 Gross per 24 hour  Intake 669.27 ml  Output 920 ml  Net -250.73 ml   Filed Weights   05/20/20 1330 05/21/20 0028  Weight: 98 kg 81.5 kg    Examination:  General exam: calm and comfortable. No distress noted.  Respiratory system: Diminished air entry at bases, no tachypnea Cardiovascular system: S1-S2 heard, tachycardia, no JVD, no pedal edema. Gastrointestinal system: Abdomen is  soft nontender bowel sounds normal Central nervous system: Alert and oriented, grossly nonfocal Extremities: No cyanosis or clubbing Skin: No rashes seen Psychiatry: Mood is appropriate   Data Reviewed: I have personally reviewed following labs and imaging studies  CBC: Recent Labs  Lab 05/24/20 0425 05/25/20 0213 05/26/20 0842 05/27/20 0148 05/28/20 0135  WBC 8.1 9.2 9.8 8.0 8.9  HGB 9.1* 10.2* 10.5* 10.7* 9.3*  HCT 27.9* 33.2* 33.0* 33.7* 30.5*  MCV 96.9 97.9 97.9 97.4 99.0  PLT 429* 432* 457* 454* 352    Basic Metabolic Panel: Recent Labs  Lab 05/24/20 0425 05/25/20 0213 05/26/20 0842 05/27/20 0148 05/28/20 0135  NA 141 140 141 141 143  K 2.8* 3.6 4.1 3.4* 4.1  CL 108 107 107 108 111  CO2 25 24 20* 24 22  GLUCOSE 81 97 87 89 88  BUN 6* 7* 6* <5* 7*  CREATININE 0.86 0.87 0.74 0.80 0.81  CALCIUM 8.1* 8.4* 8.5* 8.5* 8.5*  MG  --  2.1 1.8  --  1.7    GFR: Estimated Creatinine Clearance: 60.4 mL/min (by C-G formula based on SCr of 0.81 mg/dL).  Liver Function Tests: Recent Labs  Lab 05/23/20 1033 05/24/20 0425 05/25/20 0213 05/26/20 0842 05/27/20 0148  AST 18 18 19 26 29   ALT 15 16 14 11 11   ALKPHOS 55 51 66 70 67  BILITOT 0.8 0.6 0.4 0.7 0.4  PROT 5.3* 5.0* 5.7* 5.9* 6.2*  ALBUMIN 1.8* 1.7* 1.9* 2.1* 2.1*    CBG: No results for input(s): GLUCAP in the last 168 hours.   Recent Results (from the past 240 hour(s))  Blood culture (routine x 2)     Status: None   Collection Time: 05/20/20  1:14 AM   Specimen: BLOOD  Result Value Ref Range Status   Specimen Description BLOOD SITE NOT SPECIFIED  Final   Special Requests   Final    BOTTLES DRAWN AEROBIC AND ANAEROBIC Blood Culture adequate volume   Culture   Final    NO GROWTH 5 DAYS Performed at [facility], [address], [place], [address] [place]    Report Status 05/25/2020 FINAL  Final  Blood culture (routine x 2)     Status: None   Collection Time: 05/20/20  3:55 AM   Specimen:  BLOOD  Result Value Ref Range Status   Specimen Description BLOOD A-LINE  Final   Special Requests   Final    BOTTLES DRAWN AEROBIC AND ANAEROBIC Blood Culture adequate volume   Culture   Final    NO GROWTH 5 DAYS Performed at [facility], [address] [place], [address] [place]    Report Status 05/25/2020 FINAL  Final  Aerobic/Anaerobic Culture (surgical/deep wound)     Status: None   Collection Time: 05/20/20  8:57 PM   Specimen: Abscess  Result Value Ref Range Status   Specimen Description ABSCESS RIGHT PLEURAL  Final   Special Requests SYRINGE  Final   Gram Stain   Final    ABUNDANT WBC PRESENT, PREDOMINANTLY PMN FEW GRAM POSITIVE COCCI IN PAIRS IN CLUSTERS    Culture   Final    RARE STAPHYLOCOCCUS AUREUS NO ANAEROBES ISOLATED Performed at [facility], [address], [place], [place] [address]    Report Status 05/25/2020 FINAL  Final   Organism ID, Bacteria STAPHYLOCOCCUS AUREUS  Final      Susceptibility   Staphylococcus aureus - MIC*    CIPROFLOXACIN <=0.5 SENSITIVE Sensitive     ERYTHROMYCIN 0.5 SENSITIVE Sensitive     GENTAMICIN <=0.5 SENSITIVE Sensitive     OXACILLIN <=0.25 SENSITIVE Sensitive     TETRACYCLINE <=1 SENSITIVE Sensitive     VANCOMYCIN 1 SENSITIVE Sensitive     TRIMETH/SULFA <=10 SENSITIVE Sensitive     CLINDAMYCIN <=0.25 SENSITIVE Sensitive     RIFAMPIN <=0.5 SENSITIVE Sensitive     Inducible Clindamycin NEGATIVE Sensitive     * RARE STAPHYLOCOCCUS AUREUS         Radiology Studies: DG Chest Port 1 View  Result Date: 05/29/2020 CLINICAL DATA:  Pneumothorax with chest tubes in place EXAM: PORTABLE CHEST 1 VIEW COMPARISON:  May 28, 2020 FINDINGS: Chest tubes are unchanged in position on the right. There is a pneumothorax in the inferolateral right base region, slightly larger compared to 1 day prior without tension component. There is subcutaneous air on the right. There is a small right pleural effusion with right base  atelectatic change. There is an area of ill-defined opacity in the left base, similar to 1 day prior. Heart size and pulmonary vascularity are normal. No adenopathy. Extensive arthropathy in each shoulder noted. IMPRESSION: Chest tubes unchanged on the right with slightly larger lateral right basilar pneumothorax. No tension component. There is a small right pleural effusion with atelectatic change in the right base. There is subcutaneous air on the right. Question atelectasis versus focal area of infiltrate left base. Left lung otherwise clear. Heart size within normal limits. Electronically Signed   By: Bretta Bang III M.D.   On: 05/29/2020 08:22   DG CHEST PORT 1 VIEW  Result Date: 05/28/2020 CLINICAL DATA:  79 year old female with recent COVID-19. Diagnosis. Right chest loculated hydropneumothorax treated with chest tube. EXAM: PORTABLE CHEST 1 VIEW COMPARISON:  Portable chest 05/27/2020 and earlier. FINDINGS: Portable AP semi upright view at 0531 hours. Stable two right chest pigtail tubes. Lateral pleural thickening redemonstrated. Decreased component of pleural gas since yesterday. Slightly increased right chest wall subcutaneous gas. Stable lung volumes. Patchy and confluent right lung base opacity stable since 05/26/2020. Stable patchy opacity in the left mid and lower lung. Stable cardiac size and mediastinal contours. Visualized tracheal air column is within normal limits. Negative visible bowel gas pattern. Stable visualized osseous structures. IMPRESSION: 1. Stable right chest tubes. Decreased right pleural gas, trace residual. Underlying right pleural thickening. 2. Otherwise stable ventilation with bilateral COVID-19 pneumonia. Electronically Signed   By: Odessa Fleming M.D.   On: 05/28/2020 07:39        Scheduled Meds: . amLODipine  5 mg Oral Daily  . atorvastatin  40 mg Oral Daily  . enoxaparin (LOVENOX) injection  80 mg Subcutaneous Q12H  . influenza vaccine adjuvanted  0.5 mL  Intramuscular Tomorrow-1000  . ipratropium-albuterol  3 mL Nebulization BID  . mouth rinse  15 mL Mouth Rinse BID  . metoprolol tartrate  50 mg Oral BID  . mometasone-formoterol  2 puff Inhalation BID  . sodium  chloride flush  10-40 mL Intracatheter Q12H   Continuous Infusions: . ampicillin-sulbactam (UNASYN) IV 3 g (05/29/20 0846)     LOS: 9 days        Kathlen Mody, MD [facility]   To contact the attending provider between 7A-7P or the covering provider during after hours 7P-7A, please log into the web site [email or web address] and access using universal [place] password for that web site. If you do not have the password, please call the hospital operator.  05/29/2020, 10:53 AM

## 2020-05-29 NOTE — Care Management Important Message (Addendum)
Important Message  Patient Details  Name: Beverly Howell MRN: [id number] Date of Birth: 1941-01-07   Medicare Important Message Given:  Yes  Gave IM letter to pt. Nurse.     [name] 05/29/2020, 10:46 AM

## 2020-05-30 ENCOUNTER — Inpatient Hospital Stay (HOSPITAL_COMMUNITY): Payer: Medicare (Managed Care)

## 2020-05-30 DIAGNOSIS — Z8616 Personal history of COVID-19: Secondary | ICD-10-CM

## 2020-05-30 DIAGNOSIS — J9383 Other pneumothorax: Secondary | ICD-10-CM

## 2020-05-30 NOTE — Progress Notes (Signed)
PROGRESS NOTE    Beverly Howell  FXT:[id number] DOB: May 17, 1941 DOA: 05/19/2020 PCP: System, Provider Not In    Chief Complaint  Patient presents with  . Shortness of Breath  . Hematemesis    Brief Narrative: 79 year old lady with prior history of severe COVID-19 infection, lower extremity DVT, CVA presents to the hospital with worsening shortness of breath.  She was found to have necrotizing right-sided pneumonia with loculated hydropneumothorax.  She was initially hospitalized from 11/8-11/15 for left-sided weakness due to CVA COVID-19 pneumonia and bilateral lower extremity DVT.  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  CT surgery consulted/ID on board. Patient seen and examined today. Chest x-ray showed Stable appearance on the right chest with persistent pleural collection and basilar pneumothorax. Chest tubes remain in place.  Cardiothoracic surgery planning for talc pleurodesis.   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 Pleural fluid cultures positive for MSSA.  ID on board transition to Unasyn and when significantly improved and on discharge to transition to oral Augmentin for 2 months duration until she sees CT surgery/pulmonary.  S/p chest tubes to suction, small air leak in pigtail #1 and 2. CXR this am shows stable pneumothorax. Chest tube care as per cardiothoracic surgery.  Cardiothoracic surgery recommending talc pleurodesis     History of recent right MCA CVA with left-sided weakness probably secondary to hypercoagulable state from COVID-19 infection. Currently on therapeutic Lovenox, can  transition to oral anticoagulation on discharge. Continue with lipitor.     History of lower extremity DVT probably secondary to COVID-19 hypercoagulable state currently on therapeutic anticoagulation   Hypertension Well-controlled blood pressure parameters  Anemia of chronic disease:  Hemoglobin stable between 9 to 10.    Hypokalemia:  Replaced.  Recheck blood work tomorrow   Hyperlipidemia Continue with the Lipitor 40 mg daily.   Recent COVID-19 infection Does not require any isolation as she has been more than 3 weeks out from her initial presentation  Therapy evaluations recommending SNF with 24 hours supervision.    DVT prophylaxis: Lovenox Code Status: (Full code Family Communication: None at bedside, granddaughter at bedside Disposition:   Status is: Inpatient  Remains inpatient appropriate because:Unsafe d/c plan, IV treatments appropriate due to intensity of illness or inability to take PO and Inpatient level of care appropriate due to severity of illness  Chest tubes in place.    Dispo: The patient is from: Home              Anticipated d/c is to: SNF              Anticipated d/c date is: > 3 days              Patient currently is not medically stable to d/c.       Consultants:   Cardiothoracic surgery  Infectious disease  IR  Procedures: CT-guided chest tube placement by interventional radiology Antimicrobials: Unasyn  Subjective: No new complaints today  Objective: Vitals:   05/30/20 0856 05/30/20 0915 05/30/20 1219 05/30/20 1646  BP:  139/68 137/70 (!) 145/72  Pulse:  91 90 (!) 101  Resp:  20 20 20   Temp:  97.6 F (36.4 C) 97.9 F (36.6 C)  TempSrc:   Oral Oral  SpO2: 100% 99% 100% 96%  Weight:      Height:        Intake/Output Summary (Last 24 hours) at 05/30/2020 1726 Last data filed at 05/30/2020 1300 Gross per 24 hour  Intake 560 ml  Output 340 ml  Net 220 ml   Filed Weights   05/20/20 1330 05/21/20 0028  Weight:  98 kg 81.5 kg    Examination:  General exam: Sleeping comfortably no distress noted Respiratory system: Air entry fair bilateral, diminished at bases, no tachypnea Cardiovascular system: S1-S2 heard, slight tachycardia, no JVD, no pedal edema Gastrointestinal system: Abdomen is soft NT ND BS+ Central nervous system: Alert and oriented Extremities: no cyanosis Skin: No rashes Psychiatry: Mood is appropriate   Data Reviewed: I have personally reviewed following labs and imaging studies  CBC: Recent Labs  Lab 05/24/20 0425 05/25/20 0213 05/26/20 0842 05/27/20 0148 05/28/20 0135  WBC 8.1 9.2 9.8 8.0 8.9  HGB 9.1* 10.2* 10.5* 10.7* 9.3*  HCT 27.9* 33.2* 33.0* 33.7* 30.5*  MCV 96.9 97.9 97.9 97.4 99.0  PLT 429* 432* 457* 454* 352    Basic Metabolic Panel: Recent Labs  Lab 05/24/20 0425 05/25/20 0213 05/26/20 0842 05/27/20 0148 05/28/20 0135  NA 141 140 141 141 143  K 2.8* 3.6 4.1 3.4* 4.1  CL 108 107 107 108 111  CO2 25 24 20* 24 22  GLUCOSE 81 97 87 89 88  BUN 6* 7* 6* <5* 7*  CREATININE 0.86 0.87 0.74 0.80 0.81  CALCIUM 8.1* 8.4* 8.5* 8.5* 8.5*  MG  --  2.1 1.8  --  1.7    GFR: Estimated Creatinine Clearance: 60.4 mL/min (by C-G formula based on SCr of 0.81 mg/dL).  Liver Function Tests: Recent Labs  Lab 05/24/20 0425 05/25/20 0213 05/26/20 0842 05/27/20 0148  AST 18 19 26 29   ALT 16 14 11 11   ALKPHOS 51 66 70 67  BILITOT 0.6 0.4 0.7 0.4  PROT 5.0* 5.7* 5.9* 6.2*  ALBUMIN 1.7* 1.9* 2.1* 2.1*    CBG: No results for input(s): GLUCAP in the last 168 hours.   Recent Results (from the past 240 hour(s))  Aerobic/Anaerobic Culture (surgical/deep wound)     Status: None   Collection Time: 05/20/20  8:57 PM   Specimen: Abscess  Result Value Ref Range Status   Specimen Description ABSCESS RIGHT PLEURAL  Final   Special Requests SYRINGE  Final   Gram Stain   Final    ABUNDANT WBC PRESENT, PREDOMINANTLY PMN FEW GRAM POSITIVE COCCI IN PAIRS IN  CLUSTERS    Culture   Final    RARE STAPHYLOCOCCUS AUREUS NO ANAEROBES ISOLATED Performed at [facility], [address], [place], [address] [place]    Report Status 05/25/2020 FINAL  Final   Organism ID, Bacteria STAPHYLOCOCCUS AUREUS  Final      Susceptibility   Staphylococcus aureus - MIC*    CIPROFLOXACIN <=0.5 SENSITIVE Sensitive     ERYTHROMYCIN 0.5 SENSITIVE Sensitive     GENTAMICIN <=0.5 SENSITIVE Sensitive     OXACILLIN <=0.25 SENSITIVE Sensitive     TETRACYCLINE <=1 SENSITIVE Sensitive     VANCOMYCIN 1 SENSITIVE Sensitive     TRIMETH/SULFA <=10 SENSITIVE Sensitive     CLINDAMYCIN <=0.25 SENSITIVE Sensitive     RIFAMPIN <=0.5 SENSITIVE Sensitive     Inducible Clindamycin NEGATIVE Sensitive     * RARE STAPHYLOCOCCUS AUREUS  Radiology Studies: DG CHEST PORT 1 VIEW  Result Date: 05/30/2020 CLINICAL DATA:  Follow-up chest tubes EXAM: PORTABLE CHEST 1 VIEW COMPARISON:  05/29/2020 FINDINGS: Two pigtail catheters are again identified on the right and stable. Persistent pleural collection is seen as well as a basal lateral pneumothorax. Stable opacity is noted related to the collapsed right lower lobe. Left basilar atelectasis is noted. No new focal abnormality is seen. IMPRESSION: Stable appearance on the right chest with persistent pleural collection and basilar pneumothorax. Chest tubes remain in place. Electronically Signed   By: Alcide Clever M.D.   On: 05/30/2020 08:11   DG Chest Port 1 View  Result Date: 05/29/2020 CLINICAL DATA:  Pneumothorax with chest tubes in place EXAM: PORTABLE CHEST 1 VIEW COMPARISON:  May 28, 2020 FINDINGS: Chest tubes are unchanged in position on the right. There is a pneumothorax in the inferolateral right base region, slightly larger compared to 1 day prior without tension component. There is subcutaneous air on the right. There is a small right pleural effusion with right base atelectatic change. There is an area of  ill-defined opacity in the left base, similar to 1 day prior. Heart size and pulmonary vascularity are normal. No adenopathy. Extensive arthropathy in each shoulder noted. IMPRESSION: Chest tubes unchanged on the right with slightly larger lateral right basilar pneumothorax. No tension component. There is a small right pleural effusion with atelectatic change in the right base. There is subcutaneous air on the right. Question atelectasis versus focal area of infiltrate left base. Left lung otherwise clear. Heart size within normal limits. Electronically Signed   By: Bretta Bang III M.D.   On: 05/29/2020 08:22        Scheduled Meds: . amLODipine  5 mg Oral Daily  . atorvastatin  40 mg Oral Daily  . enoxaparin (LOVENOX) injection  80 mg Subcutaneous Q12H  . influenza vaccine adjuvanted  0.5 mL Intramuscular Tomorrow-1000  . ipratropium-albuterol  3 mL Nebulization BID  . mouth rinse  15 mL Mouth Rinse BID  . metoprolol tartrate  50 mg Oral BID  . mometasone-formoterol  2 puff Inhalation BID  . sodium chloride flush  10-40 mL Intracatheter Q12H   Continuous Infusions: . ampicillin-sulbactam (UNASYN) IV 3 g (05/30/20 0924)     LOS: 10 days        Kathlen Mody, MD [facility]   To contact the attending provider between 7A-7P or the covering provider during after hours 7P-7A, please log into the web site [email or web address] and access using universal [place] password for that web site. If you do not have the password, please call the hospital operator.  05/30/2020, 5:26 PM

## 2020-05-30 NOTE — Progress Notes (Addendum)
      [address]       [name] [address]             [phone]           Subjective: Patient sleeping this am.  Objective: Vital signs in last 24 hours: Temp:  [97.8 F (36.6 C)-98.9 F (37.2 C)] 98 F (36.7 C) (12/17 0313) Pulse Rate:  [80-115] 88 (12/17 0313) Cardiac Rhythm: Normal sinus rhythm (12/17 0400) Resp:  [18-28] 20 (12/17 0313) BP: (127-152)/(62-86) 139/67 (12/17 0313) SpO2:  [95 %-100 %] 98 % (12/17 0313)     Intake/Output from previous day: 12/16 0701 - 12/17 0700 In: 320 [P.O.:120; IV Piggyback:200] Out: 390 [Urine:300; Chest Tube:90]   Physical Exam:  Cardiovascular: RRR Pulmonary: Clear on the left and audible squeak on the right (chest tubes) Wounds: Dressing is clean and dry.   Chest Tubes: to suction. Pigtail #1 with no t air leak and pigtail #2 has had small air leak.  Lab Results: CBC: Recent Labs    05/28/20 0135  WBC 8.9  HGB 9.3*  HCT 30.5*  PLT 352   BMET:  Recent Labs    05/28/20 0135  NA 143  K 4.1  CL 111  CO2 22  GLUCOSE 88  BUN 7*  CREATININE 0.81  CALCIUM 8.5*    PT/INR: No results for input(s): LABPROT, INR in the last 72 hours. ABG:  INR: Will add last result for INR, ABG once components are confirmed Will add last 4 CBG results once components are confirmed  Assessment/Plan:  1. CV - RRR.  On Amlodipine 5 mg daily and Lopressor 50 mg bid;per primary 2.  Pulmonary - On room air. Chest tubes with 90 cc (total for both) for 24 hours . Chest tubes to suction. There is a no  air leak from pigtail #1 this am and continued, small air leak in pigtail #2. CXR this am appears to show right lateral pneumothorax and stable right pleural effusion, and atelectasis vs infiltrate left base.Chest tubes to remain for now. Check CXR in am. Patient not a surgical candidate. Will consider TALC. 3. History of CVA, recent DVT-on Lovenox 80 mg [place] bid 4. ID-on Unasyn for MSSA (previously treated with Vanco and  Cefepime)necrotizing PNA, right empyema. Per infectious disease, transition to oral Augmentin once clinically improved and this is to be continued for 2 months.  Donielle M ZimmermanPA-C 05/30/2020,7:00 AM [phone]  Follow up chest xray in

## 2020-05-30 NOTE — Progress Notes (Signed)
Occupational Therapy Treatment [name] MRN: [id number] DOB: 28-May-1941 Today's Date: 05/30/2020    History of present illness Pt is a 79 y/o female admitted from [facility] secondary to SOB and cough with hemoptysis. X-rays showed right-sided hydropneumothorax and a CT angiogram was done which shows hydropneumothorax and necrotizing pneumonia on the right side and on the left side with features concerning for previous pneumonia from Covid. Pt is s/p chest tube placement. Pt with recent admission for CVA and COVID. PMH includes HTN and DVT.   OT comments  Patient continues to progress with all goal areas.  Deficits listed below remain, but she is moving with less assist and is able to participate with more lower body ADL, needing up to Mod A, and Min A for upper body ADL.  Chest tubes, increased HR and DOE are her biggest barriers.  SNF is still recommended post acute, and OT will continue to follow in the acute setting.  Patient left up in the recliner with all needs in reach.    Follow Up Recommendations  SNF;Supervision/Assistance - 24 hour    Equipment Recommendations  3 in 1 bedside commode;Wheelchair (measurements OT);Wheelchair cushion (measurements OT);Other (comment)    Recommendations for Other Services      Precautions / Restrictions Precautions Precautions: Fall Precaution Comments: Chest tube       Mobility Bed Mobility Overal bed mobility: Needs Assistance Bed Mobility: Supine to Sit     Supine to sit: Supervision        Transfers Overall transfer level: Needs assistance Equipment used: Rolling walker (2 wheeled) Transfers: Sit to/from [employer] Transfers Sit to Stand: Supervision Stand pivot transfers: Supervision            Balance           Standing balance support: Bilateral upper extremity supported Standing balance-Leahy Scale: Poor Standing balance comment: reliant on UE support                            ADL either performed or assessed with clinical judgement   ADL   Eating/Feeding: Independent;Sitting   Grooming: Wash/dry hands;Wash/dry face;Sitting;Supervision/safety   Upper Body Bathing: Minimal assistance;Sitting   Lower Body Bathing: Moderate assistance;Sit to/from stand   Upper Body Dressing : Minimal assistance;Sitting   Lower Body Dressing: Sitting/lateral leans;Minimal assistance               Functional mobility during ADLs: Rolling walker;Minimal assistance       Vision Baseline Vision/History: No visual deficits Patient Visual Report: No change from baseline     Perception     Praxis      Cognition Arousal/Alertness: Awake/alert Behavior During Therapy: WFL for tasks assessed/performed;Agitated Overall Cognitive Status: Within Functional Limits for tasks assessed                                          Exercises     Shoulder Instructions       General Comments  HR to 145.  O2 WNL on RA.      Pertinent Vitals/ Pain       Pain Assessment: No/denies pain  Frequency  Min 2X/week        Progress Toward Goals  OT Goals(current goals can now be found in the care plan section)  Progress towards OT goals: Progressing toward goals  Acute Rehab OT Goals Patient Stated Goal: get stronger OT Goal Formulation: With patient Time For Goal Achievement: 06/05/20 Potential to Achieve Goals: Good  Plan Discharge plan remains appropriate    Co-evaluation                 AM-PAC OT "6 Clicks" Daily Activity     Outcome Measure   Help from another person eating meals?: None Help from another person taking care of personal grooming?: A Little Help from another person toileting, which includes using toliet, bedpan, or urinal?: A Little Help from another person bathing (including washing, rinsing, drying)?: A Lot Help from another person to  put on and taking off regular upper body clothing?: A Little Help from another person to put on and taking off regular lower body clothing?: A Little 6 Click Score: 18    End of Session Equipment Utilized During Treatment: Rolling walker  OT Visit Diagnosis: Unsteadiness on feet (R26.81);Other abnormalities of gait and mobility (R26.89);Hemiplegia and hemiparesis;Muscle weakness (generalized) (M62.81);Other symptoms and signs involving cognitive function Hemiplegia - Right/Left: Left   Activity Tolerance Patient limited by fatigue;Treatment limited secondary to medical complications (Comment)   Patient Left in chair;with call bell/phone within reach   Nurse Communication Mobility status        Time: 9604-5409 OT Time Calculation (min): 19 min  Charges: OT General Charges $OT Visit: 1 Visit OT Treatments $Self Care/Home Management : 8-22 mins  05/30/2020  Rich, OTR/L  Acute Rehabilitation Services  Office:  [phone]    Suzanna Obey 05/30/2020, 4:29 PM

## 2020-05-31 ENCOUNTER — Inpatient Hospital Stay (HOSPITAL_COMMUNITY): Payer: Medicare (Managed Care)

## 2020-05-31 LAB — BASIC METABOLIC PANEL
Anion gap: 10 (ref 5–15)
BUN: <5 mg/dL — ABNORMAL LOW (ref 8–23)
CO2: 27 mmol/L (ref 22–32)
Calcium: 8.2 mg/dL — ABNORMAL LOW (ref 8.9–10.3)
Chloride: 108 mmol/L (ref 98–111)
Creatinine, Ser: 0.76 mg/dL (ref 0.44–1.00)
GFR, Estimated: >60 mL/min (ref >60)
Glucose, Bld: 87 mg/dL (ref 70–99)
Potassium: 3.6 mmol/L (ref 3.5–5.1)
Sodium: 145 mmol/L (ref 135–145)

## 2020-05-31 LAB — CBC
HCT: 29 % — ABNORMAL LOW (ref 36.0–46.0)
Hemoglobin: 9.1 g/dL — ABNORMAL LOW (ref 12.0–15.0)
MCH: 30.8 pg (ref 26.0–34.0)
MCHC: 31.4 g/dL (ref 30.0–36.0)
MCV: 98.3 fL (ref 80.0–100.0)
Platelets: 459 10*3/uL — ABNORMAL HIGH (ref 150–400)
RBC: 2.95 MIL/uL — ABNORMAL LOW (ref 3.87–5.11)
RDW: 17.1 % — ABNORMAL HIGH (ref 11.5–15.5)
WBC: 8.1 10*3/uL (ref 4.0–10.5)
nRBC: 0 % (ref 0.0–0.2)

## 2020-05-31 NOTE — Progress Notes (Signed)
PROGRESS NOTE    Beverly Howell  AQT:[id number] DOB: 08/10/1940 DOA: 05/19/2020 PCP: System, Provider Not In    Chief Complaint  Patient presents with  . Shortness of Breath  . Hematemesis    Brief Narrative: 79 year old lady with prior history of severe COVID-19 infection, lower extremity DVT, CVA presents to the hospital with worsening shortness of breath.  She was found to have necrotizing right-sided pneumonia with loculated hydropneumothorax.  She was initially hospitalized from 11/8-11/15 for left-sided weakness due to CVA COVID-19 pneumonia and bilateral lower extremity DVT.  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  CT surgery consulted/ID on board. Chest x-ray this am shows Stable right chest tube and stable small right hydropneumothorax. Persistent opacity in the right base.  Chest tubes remain in place.  Cardiothoracic surgery planning for talc pleurodesis. Pt seen and examined today, sitting int he chair , reports feeling good.    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 Pleural fluid cultures positive for MSSA.  ID on board transitioned to Unasyn and when significantly improved plan to plan to discharge on  oral Augmentin for 2 months duration until she sees CT surgery/pulmonary.  S/p chest tubes to suction, small air leak in pigtail #1 and 2.  Chest tube care as per cardiothoracic surgery.  Cardiothoracic surgery recommending talc pleurodesis Pt reports feeling better, no new complaints.      History of recent right MCA CVA with left-sided weakness probably secondary to hypercoagulable state from  COVID-19 infection. Currently on therapeutic Lovenox, can transition to oral anticoagulation on discharge. Continue with lipitor. No changes in meds.     History of lower extremity DVT probably secondary to COVID-19 hypercoagulable state currently on therapeutic anticoagulation   Hypertension Well controlled .  Continue with metoprolol 50 mg twice daily and Norvasc 5 mg daily  Anemia of chronic disease:  Hemoglobin stable between 9 to 10.    Hypokalemia:  Replaced   Hyperlipidemia Continue with the Lipitor 40 mg daily.   Recent COVID-19 infection Does not require any isolation as she has been more than 3 weeks out from her initial presentation  Therapy evaluations recommending SNF with 24 hours supervision.    DVT prophylaxis: Lovenox Code Status: Full code Family Communication: None at bedside, granddaughter at bedside Disposition:   Status is: Inpatient  Remains inpatient appropriate because:Unsafe d/c plan, IV treatments appropriate due to intensity of illness or inability to take PO and Inpatient level of care appropriate due to severity of illness  Chest tubes in place.    Dispo: The patient is from: Home              Anticipated d/c is to: SNF              Anticipated d/c date is: > 3 days              Patient currently is not medically stable to d/c.       Consultants:   Cardiothoracic surgery  Infectious disease  IR  Procedures: CT-guided chest tube placement by interventional radiology Antimicrobials: Unasyn  Subjective: NO NEW COMPLAINTS. SHE REPORTS her breathing is much better.  Objective: Vitals:   05/31/20 0337 05/31/20 0754 05/31/20 0815 05/31/20 1036  BP: 140/73 (!) 128/52  (!) 142/77  Pulse:  97  88  Resp: 20 17  20   Temp: 98.7 F (37.1 C) 98.3 F (36.8 C)    TempSrc: Oral Oral    SpO2:  93% 96% 96%  Weight:      Height:        Intake/Output Summary (Last 24 hours) at 05/31/2020 1518 Last data filed at 05/31/2020  06/02/2020 Gross per 24 hour  Intake 240 ml  Output 190 ml  Net 50 ml   Filed Weights   05/20/20 1330 05/21/20 0028  Weight: 98 kg 81.5 kg    Examination:  General exam: Alert and sitting in the chair not in any distress Respiratory system: Air entry fair bilateral no wheezing or rhonchi chest tubes in place Cardiovascular system: 1 S2 heard, no JVD, no pedal edema regular rate rhythm Gastrointestinal system: abdomen is soft, nontender, nondistended bowel sounds normal Central nervous system: Alert and answering questions appropriately Extremities:  Skin: No rashes seen Psychiatry: Mood is appropriate   Data Reviewed: I have personally reviewed following labs and imaging studies  CBC: Recent Labs  Lab 05/25/20 0213 05/26/20 0842 05/27/20 0148 05/28/20 0135 05/31/20 0125  WBC 9.2 9.8 8.0 8.9 8.1  HGB 10.2* 10.5* 10.7* 9.3* 9.1*  HCT 33.2* 33.0* 33.7* 30.5* 29.0*  MCV 97.9 97.9 97.4 99.0 98.3  PLT 432* 457* 454* 352 459*    Basic Metabolic Panel: Recent Labs  Lab 05/25/20 0213 05/26/20 0842 05/27/20 0148 05/28/20 0135 05/31/20 0125  NA 140 141 141 143 145  K 3.6 4.1 3.4* 4.1 3.6  CL 107 107 108 111 108  CO2 24 20* 24 22 27   GLUCOSE 97 87 89 88 87  BUN 7* 6* <5* 7* <5*  CREATININE 0.87 0.74 0.80 0.81 0.76  CALCIUM 8.4* 8.5* 8.5* 8.5* 8.2*  MG 2.1 1.8  --  1.7  --     GFR: Estimated Creatinine Clearance: 61.1 mL/min (by C-G formula based on SCr of 0.76 mg/dL).  Liver Function Tests: Recent Labs  Lab 05/25/20 0213 05/26/20 0842 05/27/20 0148  AST 19 26 29   ALT 14 11 11   ALKPHOS 66 70 67  BILITOT 0.4 0.7 0.4  PROT 5.7* 5.9* 6.2*  ALBUMIN 1.9* 2.1* 2.1*    CBG: No results for input(s): GLUCAP in the last 168 hours.   No results found for this or any previous visit (from the past 240 hour(s)).       Radiology Studies: DG CHEST PORT 1 VIEW  Result Date: 05/31/2020 CLINICAL DATA:  Right chest tube.  Pneumothorax. EXAM: PORTABLE CHEST 1 VIEW  COMPARISON:  May 30, 2020 FINDINGS: The right chest tube is stable. The small right hydropneumothorax near the base is stable. The cardiomediastinal silhouette and lungs are otherwise unchanged. Opacity in the right base is unchanged. IMPRESSION: 1. Stable right chest tube and stable small right hydropneumothorax. Persistent opacity in the right base. No other changes. Electronically Signed   By: III M.D   On: 05/31/2020 10:00   DG CHEST PORT 1 VIEW  Result Date: 05/30/2020 CLINICAL DATA:  Follow-up chest tubes EXAM: PORTABLE CHEST 1 VIEW COMPARISON:  05/29/2020 FINDINGS: Two pigtail catheters are again identified on the right and stable. Persistent pleural collection is seen as well as a basal lateral pneumothorax. Stable opacity is noted related to the collapsed right lower lobe. Left basilar atelectasis is noted.  No new focal abnormality is seen. IMPRESSION: Stable appearance on the right chest with persistent pleural collection and basilar pneumothorax. Chest tubes remain in place. Electronically Signed   By: Alcide Clever M.D.   On: 05/30/2020 08:11        Scheduled Meds: . amLODipine  5 mg Oral Daily  . atorvastatin  40 mg Oral Daily  . enoxaparin (LOVENOX) injection  80 mg Subcutaneous Q12H  . influenza vaccine adjuvanted  0.5 mL Intramuscular Tomorrow-1000  . ipratropium-albuterol  3 mL Nebulization BID  . mouth rinse  15 mL Mouth Rinse BID  . metoprolol tartrate  50 mg Oral BID  . mometasone-formoterol  2 puff Inhalation BID  . sodium chloride flush  10-40 mL Intracatheter Q12H   Continuous Infusions: . ampicillin-sulbactam (UNASYN) IV 3 g (05/31/20 1034)     LOS: 11 days        Kathlen Mody, MD [facility]   To contact the attending provider between 7A-7P or the covering provider during after hours 7P-7A, please log into the web site [email or web address] and access using universal [place] password for that web site. If you do not have the  password, please call the hospital operator.  05/31/2020, 3:18 PM

## 2020-05-31 NOTE — Progress Notes (Addendum)
      [address]       [name] [address]             [phone]         Subjective: Says she feels her breathing is "much better"  Objective: Vital signs in last 24 hours: Temp:  [97.6 F (36.4 C)-98.7 F (37.1 C)] 98.3 F (36.8 C) (12/18 0754) Pulse Rate:  [85-101] 97 (12/18 0754) Cardiac Rhythm: Normal sinus rhythm (12/18 0335) Resp:  [17-20] 17 (12/18 0754) BP: (128-145)/(52-77) 128/52 (12/18 0754) SpO2:  [93 %-100 %] 96 % (12/18 0815)  Hemodynamic parameters for last 24 hours:    Intake/Output from previous day: 12/17 0701 - 12/18 0700 In: 480 [P.O.:480] Out: 190 [Chest Tube:190] Intake/Output this shift: No intake/output data recorded.  General appearance: alert, cooperative and no distress Heart: regular rate and rhythm and tachy Lungs: slightly dim on right base, slighly coarse throughoit Abdomen: soft, nontender Extremities: non pitting edema Wound: CT sites ok  Lab Results: Recent Labs    05/31/20 0125  WBC 8.1  HGB 9.1*  HCT 29.0*  PLT 459*   BMET:  Recent Labs    05/31/20 0125  NA 145  K 3.6  CL 108  CO2 27  GLUCOSE 87  BUN <5*  CREATININE 0.76  CALCIUM 8.2*    PT/INR: No results for input(s): LABPROT, INR in the last 72 hours. ABG    Component Value Date/Time   TCO2 27 04/21/2020 2117   CBG (last 3)  No results for input(s): GLUCAP in the last 72 hours.  Meds Scheduled Meds: . amLODipine  5 mg Oral Daily  . atorvastatin  40 mg Oral Daily  . enoxaparin (LOVENOX) injection  80 mg Subcutaneous Q12H  . influenza vaccine adjuvanted  0.5 mL Intramuscular Tomorrow-1000  . ipratropium-albuterol  3 mL Nebulization BID  . mouth rinse  15 mL Mouth Rinse BID  . metoprolol tartrate  50 mg Oral BID  . mometasone-formoterol  2 puff Inhalation BID  . sodium chloride flush  10-40 mL Intracatheter Q12H   Continuous Infusions: . ampicillin-sulbactam (UNASYN) IV 3 g (05/31/20 0338)   PRN Meds:.acetaminophen **OR**  acetaminophen, oxyCODONE-acetaminophen, sodium chloride flush  Xrays DG CHEST PORT 1 VIEW  Result Date: 05/30/2020 CLINICAL DATA:  Follow-up chest tubes EXAM: PORTABLE CHEST 1 VIEW COMPARISON:  05/29/2020 FINDINGS: Two pigtail catheters are again identified on the right and stable. Persistent pleural collection is seen as well as a basal lateral pneumothorax. Stable opacity is noted related to the collapsed right lower lobe. Left basilar atelectasis is noted. No new focal abnormality is seen. IMPRESSION: Stable appearance on the right chest with persistent pleural collection and basilar pneumothorax. Chest tubes remain in place. Electronically Signed   By: Alcide Clever M.D.   On: 05/30/2020 08:11    Assessment/Plan:  1 afeb, VSS 2 sats good on RA 3 no leukocytosis 4 H/H pretty stable 5 CXR stable in appearance, on unasyn 6 normal renal fxn 7 CT 190 cc yesterday, small air leak in tube #2 8 cont current RX for now, med management per primary    LOS: 11 days    Rowe Clack 05/31/2020  up to chair feels better Very small air leak #2 tube- will move to water seal  I have seen and examined [name] and agree with the above assessment  and plan.  [name] MD Beeper [phone] Office [phone] 05/31/2020 1:28 PM

## 2020-06-01 NOTE — Plan of Care (Signed)

## 2020-06-01 NOTE — Progress Notes (Signed)
PROGRESS NOTE    Beverly Howell  ZDG:[id number] DOB: 02/12/1941 DOA: 05/19/2020 PCP: System, Provider Not In    Chief Complaint  Patient presents with  . Shortness of Breath  . Hematemesis    Brief Narrative: 79 year old lady with prior history of severe COVID-19 infection, lower extremity DVT, CVA presented to the hospital with worsening shortness of breath.  She was found to have necrotizing right-sided pneumonia with loculated hydropneumothorax.  She was initially hospitalized from 11/8-11/15 for left-sided weakness due to CVA COVID-19 pneumonia and bilateral lower extremity DVT.  CT angiogram on 05/20/2020 showed a large loculated right hydropneumothorax and necrotizing pneumonia.  CT surgery consulted/ID on board. -Chest tube placed in IR   Assessment & Plan:   MSSA necrotizing pneumonia of the right lung with empyema Hydropneumothorax -S/p chest tube x2  Placed in IR 12/7 -CT surgery following  -Pleural fluid cultures positive for MSSA.   -Followed by ID on board transitioned to Unasyn and when significantly improved plan to plan to discharge on  oral Augmentin for 2 months duration until she sees CT surgery/pulmonary. -Chest tubes to waterseal -per TCTS  History of recent right MCA CVA with left-sided weakness probably secondary to hypercoagulable state from COVID-19 infection. Currently on therapeutic Lovenox, can transition to oral anticoagulation on discharge. Continue with lipitor. No changes in meds.   History of lower extremity DVT  -probably secondary to COVID-19 hypercoagulable state currently on therapeutic anticoagulation  Hypertension  -Continue with metoprolol 50 mg twice daily and Norvasc 5 mg daily  Anemia of chronic disease:  Hemoglobin stable between 9 to 10.   Hypokalemia:  Replaced  Recent COVID-19 infection Does not require any isolation as she has been more than 3 weeks out from her initial presentation  Debility/deconditioning Therapy  evaluations recommending SNF with 24 hours supervision.    DVT prophylaxis: Lovenox Code Status: Full code Family Communication: None at bedside, granddaughter at bedside Disposition:   Status is: Inpatient  Remains inpatient appropriate because:Unsafe d/c plan, IV treatments appropriate due to intensity of illness or inability to take PO and Inpatient level of care appropriate due to severity of illness  Chest tubes in place.    Dispo: The patient is from: Home              Anticipated d/c is to: SNF              Anticipated d/c date is: > 3 days              Patient currently is not medically stable to d/c.       Consultants:   Cardiothoracic surgery  Infectious disease  IR  Procedures: CT-guided chest tube placement by interventional radiology x2 on 12/7 Antimicrobials: Unasyn  Subjective: -Feels okay, breathing better overall  Objective: Vitals:   06/01/20 0454 06/01/20 0833 06/01/20 0907 06/01/20 0908  BP: 139/72 134/71    Pulse: 79 75    Resp: 20 20    Temp: 98.2 F (36.8 C) 98.4 F (36.9 C)    TempSrc: Oral Oral    SpO2: 97% 97% 96% 95%  Weight:      Height:        Intake/Output Summary (Last 24 hours) at 06/01/2020 1252 Last data filed at 06/01/2020 0500 Gross per 24 hour  Intake 740 ml  Output 590 ml  Net 150 ml   Filed Weights   05/20/20 1330 05/21/20 0028  Weight: 98 kg 81.5 kg    Examination:  General exam:  Pleasant Pleasant elderly female sitting up in bed, AAOx3, no distress CVS: S1-S2, regular rate rhythm Lungs: Decreased breath sounds the bases, two right-sided chest tubes noted to waterseal Abdomen: Soft, nontender, bowel sounds present Extremities: No edema Skin no rashes on exposed skin Psychiatry: Mood is appropriate   Data Reviewed: I have personally reviewed following labs and imaging studies  CBC: Recent Labs  Lab 05/26/20 0842 05/27/20 0148 05/28/20 0135 05/31/20 0125  WBC 9.8 8.0 8.9 8.1  HGB 10.5* 10.7*  9.3* 9.1*  HCT 33.0* 33.7* 30.5* 29.0*  MCV 97.9 97.4 99.0 98.3  PLT 457* 454* 352 459*    Basic Metabolic Panel: Recent Labs  Lab 05/26/20 0842 05/27/20 0148 05/28/20 0135 05/31/20 0125  NA 141 141 143 145  K 4.1 3.4* 4.1 3.6  CL 107 108 111 108  CO2 20* 24 22 27   GLUCOSE 87 89 88 87  BUN 6* <5* 7* <5*  CREATININE 0.74 0.80 0.81 0.76  CALCIUM 8.5* 8.5* 8.5* 8.2*  MG 1.8  --  1.7  --     GFR: Estimated Creatinine Clearance: 61.1 mL/min (by C-G formula based on SCr of 0.76 mg/dL).  Liver Function Tests: Recent Labs  Lab 05/26/20 0842 05/27/20 0148  AST 26 29  ALT 11 11  ALKPHOS 70 67  BILITOT 0.7 0.4  PROT 5.9* 6.2*  ALBUMIN 2.1* 2.1*    CBG: No results for input(s): GLUCAP in the last 168 hours.   No results found for this or any previous visit (from the past 240 hour(s)).   Radiology Studies: DG CHEST PORT 1 VIEW  Result Date: 05/31/2020 CLINICAL DATA:  Right chest tube.  Pneumothorax. EXAM: PORTABLE CHEST 1 VIEW COMPARISON:  May 30, 2020 FINDINGS: The right chest tube is stable. The small right hydropneumothorax near the base is stable. The cardiomediastinal silhouette and lungs are otherwise unchanged. Opacity in the right base is unchanged. IMPRESSION: 1. Stable right chest tube and stable small right hydropneumothorax. Persistent opacity in the right base. No other changes. Electronically Signed   By: June 01, 2020 III M.D   On: 05/31/2020 10:00   Scheduled Meds: . amLODipine  5 mg Oral Daily  . atorvastatin  40 mg Oral Daily  . enoxaparin (LOVENOX) injection  80 mg Subcutaneous Q12H  . influenza vaccine adjuvanted  0.5 mL Intramuscular Tomorrow-1000  . ipratropium-albuterol  3 mL Nebulization BID  . mouth rinse  15 mL Mouth Rinse BID  . metoprolol tartrate  50 mg Oral BID  . mometasone-formoterol  2 puff Inhalation BID  . sodium chloride flush  10-40 mL Intracatheter Q12H   Continuous Infusions: . ampicillin-sulbactam (UNASYN) IV 3 g  (06/01/20 0959)     LOS: 12 days   06/03/20, MD [facility]  06/01/2020, 12:52 PM

## 2020-06-01 NOTE — Progress Notes (Addendum)
      [address]       [place] [address]             [phone]         Subjective: Says breathing is more comfortable today  Objective: Vital signs in last 24 hours: Temp:  [97.5 F (36.4 C)-98.4 F (36.9 C)] 98.4 F (36.9 C) (12/19 0833) Pulse Rate:  [75-100] 75 (12/19 0833) Cardiac Rhythm: Sinus tachycardia;Bundle branch block (12/18 1901) Resp:  [17-20] 20 (12/19 0833) BP: (129-148)/(57-79) 134/71 (12/19 0833) SpO2:  [95 %-100 %] 95 % (12/19 0908)  Hemodynamic parameters for last 24 hours:    Intake/Output from previous day: 12/18 0701 - 12/19 0700 In: 980 [P.O.:580; IV Piggyback:400] Out: 590 [Urine:550; Chest Tube:40] Intake/Output this shift: No intake/output data recorded.  General appearance: alert, cooperative and no distress Heart: slightly irreg, tachy Lungs: dim right lower fields  Lab Results: Recent Labs    05/31/20 0125  WBC 8.1  HGB 9.1*  HCT 29.0*  PLT 459*   BMET:  Recent Labs    05/31/20 0125  NA 145  K 3.6  CL 108  CO2 27  GLUCOSE 87  BUN <5*  CREATININE 0.76  CALCIUM 8.2*    PT/INR: No results for input(s): LABPROT, INR in the last 72 hours. ABG    Component Value Date/Time   TCO2 27 04/21/2020 2117   CBG (last 3)  No results for input(s): GLUCAP in the last 72 hours.  Meds Scheduled Meds: . amLODipine  5 mg Oral Daily  . atorvastatin  40 mg Oral Daily  . enoxaparin (LOVENOX) injection  80 mg Subcutaneous Q12H  . influenza vaccine adjuvanted  0.5 mL Intramuscular Tomorrow-1000  . ipratropium-albuterol  3 mL Nebulization BID  . mouth rinse  15 mL Mouth Rinse BID  . metoprolol tartrate  50 mg Oral BID  . mometasone-formoterol  2 puff Inhalation BID  . sodium chloride flush  10-40 mL Intracatheter Q12H   Continuous Infusions: . ampicillin-sulbactam (UNASYN) IV 3 g (06/01/20 0500)   PRN Meds:.acetaminophen **OR** acetaminophen, oxyCODONE-acetaminophen, sodium chloride flush  Xrays DG CHEST  PORT 1 VIEW  Result Date: 05/31/2020 CLINICAL DATA:  Right chest tube.  Pneumothorax. EXAM: PORTABLE CHEST 1 VIEW COMPARISON:  May 30, 2020 FINDINGS: The right chest tube is stable. The small right hydropneumothorax near the base is stable. The cardiomediastinal silhouette and lungs are otherwise unchanged. Opacity in the right base is unchanged. IMPRESSION: 1. Stable right chest tube and stable small right hydropneumothorax. Persistent opacity in the right base. No other changes. Electronically Signed   By: Gerome Sam III M.D   On: 05/31/2020 10:00    Assessment/Plan:  1 afeb, some systolic HTN , occas sinus tachy, junctional dysrythymias 2 sats ok on RA 3 no new labs or CXR's, will get CXR in am 2 V 4 40 cc from CT's yesterday, no air leak curently 5 conts unasyn 6 poss d/c a chest tube soon  LOS: 12 days    Rowe Clack PA-C Pager [phone] 06/01/2020  no air leak today, likely start removing chest tubes tomorrow  I have seen and examined Beverly Howell and agree with the above assessment  and plan.  [name] MD Beeper [phone] Office [phone] 06/01/2020 12:52 PM

## 2020-06-02 ENCOUNTER — Inpatient Hospital Stay (HOSPITAL_COMMUNITY): Payer: Medicare (Managed Care)

## 2020-06-02 LAB — CBC
HCT: 31.4 % — ABNORMAL LOW (ref 36.0–46.0)
Hemoglobin: 9.6 g/dL — ABNORMAL LOW (ref 12.0–15.0)
MCH: 30.2 pg (ref 26.0–34.0)
MCHC: 30.6 g/dL (ref 30.0–36.0)
MCV: 98.7 fL (ref 80.0–100.0)
Platelets: 482 10*3/uL — ABNORMAL HIGH (ref 150–400)
RBC: 3.18 MIL/uL — ABNORMAL LOW (ref 3.87–5.11)
RDW: 16.7 % — ABNORMAL HIGH (ref 11.5–15.5)
WBC: 7.3 10*3/uL (ref 4.0–10.5)
nRBC: 0 % (ref 0.0–0.2)

## 2020-06-02 LAB — BASIC METABOLIC PANEL
Anion gap: 9 (ref 5–15)
BUN: <5 mg/dL — ABNORMAL LOW (ref 8–23)
CO2: 27 mmol/L (ref 22–32)
Calcium: 7.7 mg/dL — ABNORMAL LOW (ref 8.9–10.3)
Chloride: 106 mmol/L (ref 98–111)
Creatinine, Ser: 0.7 mg/dL (ref 0.44–1.00)
GFR, Estimated: >60 mL/min (ref >60)
Glucose, Bld: 83 mg/dL (ref 70–99)
Potassium: 2.6 mmol/L — CL (ref 3.5–5.1)
Sodium: 142 mmol/L (ref 135–145)

## 2020-06-02 MED ORDER — POTASSIUM CHLORIDE CRYS ER 20 MEQ PO TBCR: 40.0000 meq | EXTENDED_RELEASE_TABLET | ORAL | Status: DC

## 2020-06-02 MED ORDER — POTASSIUM CHLORIDE CRYS ER 20 MEQ PO TBCR
40.0000 meq | EXTENDED_RELEASE_TABLET | Freq: Once | ORAL | Status: AC
  Administered 2020-06-02: 03:00:00 40 meq via ORAL
  Filled 2020-06-02: qty 2

## 2020-06-02 MED ORDER — POTASSIUM CHLORIDE 10 MEQ/100ML IV SOLN
10.0000 meq | INTRAVENOUS | Status: AC
  Administered 2020-06-02 (×5): 10 meq via INTRAVENOUS
  Filled 2020-06-02 (×6): qty 100

## 2020-06-02 MED ORDER — POTASSIUM CHLORIDE CRYS ER 20 MEQ PO TBCR
40.0000 meq | EXTENDED_RELEASE_TABLET | Freq: Once | ORAL | Status: AC
  Administered 2020-06-02: 09:00:00 40 meq via ORAL
  Filled 2020-06-02: qty 2

## 2020-06-02 NOTE — Progress Notes (Addendum)
PROGRESS NOTE    Beverly Howell  ZOX:[id number] DOB: 1940-07-28 DOA: 05/19/2020 PCP: System, Provider Not In    Chief Complaint  Patient presents with  . Shortness of Breath  . Hematemesis    Brief Narrative: 79 year old lady with prior history of severe COVID-19 infection, lower extremity DVT, CVA presented to the hospital with worsening shortness of breath.  She was initially hospitalized from 11/8-11/15 for left-sided weakness due to CVA COVID-19 pneumonia and bilateral lower extremity DVT.   -Presented to the ED from [facility] on 12/6 with shortness of breath cough and hemoptysis , CT angiogram on 05/20/2020 showed a large loculated right hydropneumothorax and necrotizing pneumonia.  CT surgery following -2 Chest tubes placed in IR on 12/7   Assessment & Plan:   MSSA necrotizing pneumonia of the right lung with empyema Hydropneumothorax -S/p chest tube x2  Placed in IR 12/7 -CT surgery following  -Pleural fluid cultures positive for MSSA.   -Followed by ID on board transitioned to Unasyn and when significantly improved plan to plan to discharge on  oral Augmentin for 2 months duration until she sees CT surgery/pulmonary. -Has 2 chest tubes to waterseal now, clinically improving,  -per TCTS -Ambulate, PT OT as tolerated  History of recent right MCA CVA with left-sided weakness probably secondary to hypercoagulable state from COVID-19 infection. Currently on therapeutic Lovenox, can transition to oral anticoagulation on discharge. Continue with lipitor.   History of lower extremity DVT  -probably secondary to COVID-19 hypercoagulable state currently on therapeutic anticoagulation  Hypertension  -Continue with metoprolol 50 mg twice daily and Norvasc 5 mg daily  Anemia of chronic disease:  Hemoglobin stable between 9 to 10.   Hypokalemia:  Replace  Recent COVID-19 infection Does not require any isolation as she has been more than 3 weeks out from her initial  presentation  Debility/deconditioning Therapy evaluations recommending SNF with 24 hours supervision.    DVT prophylaxis-Lovenox Code Status-full code Family Communication-no family at bedside Disposition:   Status is: Inpatient  Remains inpatient appropriate because: Pneumothorax with 2 chest tubes in place.    Dispo: The patient is from: Home              Anticipated d/c is to: SNF versus home with home health              Anticipated d/c date is: Likely 2 to 3 days              Patient currently is not medically stable to d/c.       Consultants:   Cardiothoracic surgery  Infectious disease  IR  Procedures: CT-guided chest tube placement by interventional radiology x2 on 12/7 Antimicrobials: Unasyn  Subjective: -Feels okay, breathing better overall  Objective: Vitals:   06/02/20 0739 06/02/20 0851 06/02/20 0854 06/02/20 1044  BP: (!) 137/95   132/74  Pulse: 100 89  88  Resp: 20 18  (!) 21  Temp: 97.6 F (36.4 C)   (!) 97.4 F (36.3 C)  TempSrc: Oral   Oral  SpO2: 98% 94% 94% 100%  Weight:      Height:        Intake/Output Summary (Last 24 hours) at 06/02/2020 1238 Last data filed at 06/02/2020 0900 Gross per 24 hour  Intake 1612.32 ml  Output 400 ml  Net 1212.32 ml   Filed Weights   05/20/20 1330 05/21/20 0028  Weight: 98 kg 81.5 kg    Examination:  General exam: Pleasant elderly female sitting up  in bed, AAOx3, no distress CVS: S1-S2, regular rate rhythm Lungs: Decreased breath sounds in the right, 2 chest tubes to waterseal Abdomen: Soft, nontender, bowel sounds present Extremities: No edema  Skin no rashes on exposed skin Psychiatry: Mood is appropriate   Data Reviewed: I have personally reviewed following labs and imaging studies  CBC: Recent Labs  Lab 05/27/20 0148 05/28/20 0135 05/31/20 0125 06/02/20 0126  WBC 8.0 8.9 8.1 7.3  HGB 10.7* 9.3* 9.1* 9.6*  HCT 33.7* 30.5* 29.0* 31.4*  MCV 97.4 99.0 98.3 98.7  PLT 454* 352  459* 482*    Basic Metabolic Panel: Recent Labs  Lab 05/27/20 0148 05/28/20 0135 05/31/20 0125 06/02/20 0126  NA 141 143 145 142  K 3.4* 4.1 3.6 2.6*  CL 108 111 108 106  CO2 24 22 27 27   GLUCOSE 89 88 87 83  BUN <5* 7* <5* <5*  CREATININE 0.80 0.81 0.76 0.70  CALCIUM 8.5* 8.5* 8.2* 7.7*  MG  --  1.7  --   --     GFR: Estimated Creatinine Clearance: 61.1 mL/min (by C-G formula based on SCr of 0.7 mg/dL).  Liver Function Tests: Recent Labs  Lab 05/27/20 0148  AST 29  ALT 11  ALKPHOS 67  BILITOT 0.4  PROT 6.2*  ALBUMIN 2.1*    CBG: No results for input(s): GLUCAP in the last 168 hours.   No results found for this or any previous visit (from the past 240 hour(s)).   Radiology Studies: DG Chest 2 View  Result Date: 06/02/2020 CLINICAL DATA:  Follow-up.  Chest tube. EXAM: CHEST - 2 VIEW COMPARISON:  05/31/2020.  05/30/2020. FINDINGS: Right chest tubes in stable position. No definite pneumothorax noted on today's exam. Persistent right pleural thickening/effusion. Persistent bibasilar atelectasis/infiltrates. Heart size stable. Degenerative change thoracic spine and both shoulders. Mild chest wall subcutaneous emphysema. IMPRESSION: Right chest tubes in stable position. No definite pneumothorax noted on today's exam. Persistent right pleural thickening and bibasilar atelectasis/infiltrates. Electronically Signed   By: 06/01/2020  Register   On: 06/02/2020 08:01   Scheduled Meds: . amLODipine  5 mg Oral Daily  . atorvastatin  40 mg Oral Daily  . enoxaparin (LOVENOX) injection  80 mg Subcutaneous Q12H  . influenza vaccine adjuvanted  0.5 mL Intramuscular Tomorrow-1000  . ipratropium-albuterol  3 mL Nebulization BID  . mouth rinse  15 mL Mouth Rinse BID  . metoprolol tartrate  50 mg Oral BID  . mometasone-formoterol  2 puff Inhalation BID   Continuous Infusions: . ampicillin-sulbactam (UNASYN) IV 3 g (06/02/20 1047)     LOS: 13 days   06/04/20, MD [facility]  06/02/2020, 12:38 PM

## 2020-06-02 NOTE — Progress Notes (Signed)
Patient with right upper chest tube #2 removed. Dressing applied.  Will monitor. Eiman Maret, Randall An rN

## 2020-06-02 NOTE — Progress Notes (Signed)
PT Cancellation Note  Patient Details Name: Aaryana Betke MRN: [id number] DOB: [birth year]   Cancelled Treatment:    Reason Eval/Treat Not Completed: (P) Other (comment) (Pt eating lunch will f/u per POC.)   Noris Kulinski Artis Delay 06/02/2020, 12:40 PM  Bonney Leitz , PTA Acute Rehabilitation Services Pager [phone] Office [phone]

## 2020-06-02 NOTE — Care Management Important Message (Addendum)
Important Message  Patient Details  Name: Beverly Howell MRN: [id number] Date of Birth: 11-Feb-1941   Medicare Important Message Given:  Yes  Gave IM  letter to nurse for this pt.     [name] 06/02/2020, 11:33 AM

## 2020-06-02 NOTE — Progress Notes (Signed)
Physical Therapy Treatment [name] MRN: [id number] DOB: 24-Apr-1941 Today's Date: 06/02/2020    History of Present Illness Pt is a 79 y/o female admitted from [facility] secondary to SOB and cough with hemoptysis. X-rays showed right-sided hydropneumothorax and a CT angiogram was done which shows hydropneumothorax and necrotizing pneumonia on the right side and on the left side with features concerning for previous pneumonia from Covid. Pt is s/p chest tube placement. Pt with recent admission for CVA and COVID. PMH includes HTN and DVT.    PT Comments    Pt supine in bed on arrival.  Pt eager to mobilize.  Able to advance gt training to door and back in room with mild DOE and elevated HR.  Pt continues to require min assistance for mobility.  Pt continues to benefit from snf placement.      Follow Up Recommendations  SNF;Supervision/Assistance - 24 hour     Equipment Recommendations  Wheelchair (measurements PT);Wheelchair cushion (measurements PT)    Recommendations for Other Services Rehab consult     Precautions / Restrictions Precautions Precautions: Fall Precaution Comments: Chest tube x1 Restrictions Weight Bearing Restrictions: No    Mobility  Bed Mobility Overal bed mobility: Needs Assistance Bed Mobility: Supine to Sit Rolling: Supervision   Supine to sit: Supervision     General bed mobility comments: Supervision for lines/leads.  Transfers Overall transfer level: Needs assistance Equipment used: Rolling walker (2 wheeled) Transfers: Sit to/from Stand Sit to Stand: Supervision         General transfer comment: Slow to rise, cues for hand placement.  Pt holding RW returning back to bed.  Ambulation/Gait Ambulation/Gait assistance: Min assist Gait Distance (Feet): 24 Feet Assistive device: Rolling walker (2 wheeled) Gait Pattern/deviations: Step-through pattern;Trunk flexed;Decreased stride length Gait velocity: decr   General  Gait Details: Cues for upper trunk control and RW safety.  Pt with mild DO.  SPO2 93% on RA, HR elevated to 119 bpm.   Stairs             Wheelchair Mobility    Modified Rankin (Stroke Patients Only)       Balance Overall balance assessment: Needs assistance Sitting-balance support: No upper extremity supported;Feet supported Sitting balance-Leahy Scale: Good       Standing balance-Leahy Scale: Poor                              Cognition Arousal/Alertness: Awake/alert Behavior During Therapy: WFL for tasks assessed/performed;Agitated Overall Cognitive Status: Within Functional Limits for tasks assessed                                        Exercises      General Comments        Pertinent Vitals/Pain Pain Assessment: Faces Faces Pain Scale: Hurts little more Pain Location: Chest tube site Pain Descriptors / Indicators: Grimacing Pain Intervention(s): Monitored during session;Repositioned    Home Living                      Prior Function            PT Goals (current goals can now be found in the care plan section) Acute Rehab PT Goals Patient Stated Goal: get stronger Potential to Achieve Goals: Fair Progress towards PT goals: Progressing toward goals  Frequency    Min 2X/week      PT Plan Current plan remains appropriate    Co-evaluation              AM-PAC PT "6 Clicks" Mobility   Outcome Measure  Help needed turning from your back to your side while in a flat bed without using bedrails?: A Little Help needed moving from lying on your back to sitting on the side of a flat bed without using bedrails?: A Little Help needed moving to and from a bed to a chair (including a wheelchair)?: A Little Help needed standing up from a chair using your arms (e.g., wheelchair or bedside chair)?: A Little Help needed to walk in hospital room?: A Little Help needed climbing 3-5 steps with a railing? :  Total 6 Click Score: 16    End of Session Equipment Utilized During Treatment: Gait belt;Oxygen Activity Tolerance: Patient tolerated treatment well Patient left: in bed;with bed alarm set;with call bell/phone within reach Nurse Communication: Mobility status PT Visit Diagnosis: Muscle weakness (generalized) (M62.81);Unsteadiness on feet (R26.81) Hemiplegia - Right/Left: Left Hemiplegia - dominant/non-dominant: Non-dominant Hemiplegia - caused by: Cerebral infarction     Time: 1601-0932 PT Time Calculation (min) (ACUTE ONLY): 15 min  Charges:  $Gait Training: 8-22 mins                     Bonney Leitz , PTA Acute Rehabilitation Services Pager [phone] Office [phone]     Karas Pickerill Artis Delay 06/02/2020, 5:33 PM

## 2020-06-02 NOTE — Progress Notes (Addendum)
      [address]       [place] [address]             [phone]           Subjective: Patient sitting in chair and needs to use bedside commode. Nurse and I assisted her.  Objective: Vital signs in last 24 hours: Temp:  [97.3 F (36.3 C)-98.4 F (36.9 C)] 97.3 F (36.3 C) (12/20 0325) Pulse Rate:  [75-97] 92 (12/20 0325) Cardiac Rhythm: Normal sinus rhythm;Bundle branch block (12/20 0325) Resp:  [17-21] 21 (12/20 0325) BP: (133-145)/(69-79) 145/75 (12/20 0325) SpO2:  [94 %-98 %] 98 % (12/20 0325)     Intake/Output from previous day: 12/19 0701 - 12/20 0700 In: 1521.1 [P.O.:940; IV Piggyback:581.1] Out: 900 [Urine:900]   Physical Exam:  Cardiovascular: Tachycardic Pulmonary: Clear on the left and slightly diminished right base Wounds: Dressing is clean and dry.   Chest Tubes: to water seal. Neither pigtail chest tube with air leak this am.  Lab Results: CBC: Recent Labs    05/31/20 0125 06/02/20 0126  WBC 8.1 7.3  HGB 9.1* 9.6*  HCT 29.0* 31.4*  PLT 459* 482*   BMET:  Recent Labs    05/31/20 0125 06/02/20 0126  NA 145 142  K 3.6 2.6*  CL 108 106  CO2 27 27  GLUCOSE 87 83  BUN <5* <5*  CREATININE 0.76 0.70  CALCIUM 8.2* 7.7*    PT/INR: No results for input(s): LABPROT, INR in the last 72 hours. ABG:  INR: Will add last result for INR, ABG once components are confirmed Will add last 4 CBG results once components are confirmed  Assessment/Plan:  1. CV - Tachycardic with HR in the 100's this am  On Amlodipine 5 mg daily and Lopressor 50 mg bid;per primary 2.  Pulmonary - On room air. Chest tubes output recorded as 0.  Chest tubes to water seal and no air leak from either. CXR this am appears to show stable,right lateral pneumothorax and small right pleural effusion. Hope to remove 1 chest tube. Check CXR in am. Patient not a surgical candidate. 3. History of CVA, recent DVT-on Lovenox 80 mg [place] bid 4. ID-on Unasyn for MSSA  (previously treated with Vanco and Cefepime). 5. Supplement potassium per primary  Beverly M ZimmermanPA-C 06/02/2020,7:08 AM [phone]  No air leak, will d/c chest tube #2 today and likely remaining tube tomorrow  I have seen and examined Beverly Howell and agree with the above assessment  and plan.  [name] MD Beeper [phone] Office [phone] 06/02/2020 3:00 PM

## 2020-06-02 NOTE — Progress Notes (Signed)
CRITICAL VALUE ALERT  Critical Value: Potassium 2.6   Date & Time Notied:  06/02/20 @0255   Provider Notified: Dr.  Orders Received/Actions taken: Repletion ordered.

## 2020-06-03 ENCOUNTER — Inpatient Hospital Stay (HOSPITAL_COMMUNITY): Payer: Medicare (Managed Care)

## 2020-06-03 LAB — BASIC METABOLIC PANEL
Anion gap: 10 (ref 5–15)
BUN: <5 mg/dL — ABNORMAL LOW (ref 8–23)
CO2: 24 mmol/L (ref 22–32)
Calcium: 8 mg/dL — ABNORMAL LOW (ref 8.9–10.3)
Chloride: 108 mmol/L (ref 98–111)
Creatinine, Ser: 0.76 mg/dL (ref 0.44–1.00)
GFR, Estimated: >60 mL/min (ref >60)
Glucose, Bld: 87 mg/dL (ref 70–99)
Potassium: 3.8 mmol/L (ref 3.5–5.1)
Sodium: 142 mmol/L (ref 135–145)

## 2020-06-03 LAB — CBC
HCT: 32.9 % — ABNORMAL LOW (ref 36.0–46.0)
Hemoglobin: 10 g/dL — ABNORMAL LOW (ref 12.0–15.0)
MCH: 29.9 pg (ref 26.0–34.0)
MCHC: 30.4 g/dL (ref 30.0–36.0)
MCV: 98.2 fL (ref 80.0–100.0)
Platelets: 447 10*3/uL — ABNORMAL HIGH (ref 150–400)
RBC: 3.35 MIL/uL — ABNORMAL LOW (ref 3.87–5.11)
RDW: 16.7 % — ABNORMAL HIGH (ref 11.5–15.5)
WBC: 8.2 10*3/uL (ref 4.0–10.5)
nRBC: 0 % (ref 0.0–0.2)

## 2020-06-03 MED ORDER — TALC (STERITALC) POWDER FOR INTRAPLEURAL USE
4.0000 g | Freq: Once | INTRAVENOUS | Status: AC
  Administered 2020-06-03: 15:00:00 4 g via INTRAPLEURAL
  Filled 2020-06-03: qty 4

## 2020-06-03 MED ORDER — POTASSIUM CHLORIDE CRYS ER 20 MEQ PO TBCR
20.0000 meq | EXTENDED_RELEASE_TABLET | Freq: Once | ORAL | Status: AC
  Administered 2020-06-03: 08:00:00 20 meq via ORAL
  Filled 2020-06-03: qty 1

## 2020-06-03 NOTE — Progress Notes (Signed)
ANTICOAGULATION CONSULT NOTE - Follow Up Consult  Pharmacy Consult for Lovenox Indication:  Recent history of CVA and DVT   No Known Allergies  Patient Measurements: Height: 5\' 5"  (165.1 cm) Weight: 81.5 kg (179 lb 10.8 oz) IBW/kg (Calculated) : 57   Vital Signs: Temp: 97.5 F (36.4 C) (12/21 0727) Temp Source: Oral (12/21 0727) BP: 120/87 (12/21 0727) Pulse Rate: 89 (12/21 0727)  Labs: Recent Labs    06/02/20 0126 06/03/20 0114  HGB 9.6* 10.0*  HCT 31.4* 32.9*  PLT 482* 447*  CREATININE 0.70 0.76    Estimated Creatinine Clearance: 61.1 mL/min (by C-G formula based on SCr of 0.76 mg/dL).   Medications:  Medications Prior to Admission  Medication Sig Dispense Refill Last Dose  . acetaminophen (TYLENOL) 500 MG tablet Take 1,000 mg by mouth in the morning, at noon, and at bedtime.   05/14/2020  . albuterol (VENTOLIN HFA) 108 (90 Base) MCG/ACT inhaler Inhale 2 puffs into the lungs every 8 (eight) hours as needed for wheezing.    unk at prn  . amLODipine (NORVASC) 5 MG tablet Take 1 tablet (5 mg total) by mouth daily.   05/19/2020 at Unknown time  . apixaban (ELIQUIS) 5 MG TABS tablet Take 1 tablet (5 mg total) by mouth 2 (two) times daily. Start 11/17 evening. 60 tablet  05/19/2020 at 2200  . atenolol (TENORMIN) 50 MG tablet Take 50 mg by mouth at bedtime.   05/18/2020 at 2000  . atorvastatin (LIPITOR) 40 MG tablet Take 1 tablet (40 mg total) by mouth daily.   05/18/2020 at 2000  . Calcium Carb-Cholecalciferol (CALCIUM 600-D PO) Take 1 tablet by mouth in the morning and at bedtime.    05/19/2020 at Unknown time  . cholecalciferol (VITAMIN D) 25 MCG (1000 UNIT) tablet Take 1,000 Units by mouth daily.    05/19/2020 at Unknown time  . dexamethasone (DECADRON) 6 MG tablet Take 6 mg by mouth at bedtime.   05/18/2020  . furosemide (LASIX) 10 MG/ML injection Inject 10 mg into the muscle daily.   05/19/2020 at Unknown time  . guaiFENesin (MUCINEX) 600 MG 12 hr tablet Take 1,200 mg by mouth  2 (two) times daily.   05/14/2020  . ipratropium-albuterol (DUONEB) 0.5-2.5 (3) MG/3ML SOLN Take 3 mLs by nebulization 2 (two) times daily as needed.   05/16/2020  . lidocaine (LMX) 4 % cream Apply 1 application topically 2 (two) times daily.   05/19/2020 at Unknown time  . Omega-3 Fatty Acids (FISH OIL TRIPLE STRENGTH) 1400 MG CAPS Take 1,400 mg by mouth daily.   05/19/2020 at Unknown time  . pantoprazole (PROTONIX) 40 MG tablet Take 1 tablet (40 mg total) by mouth daily.   05/19/2020 at Unknown time  . polyethylene glycol (MIRALAX / GLYCOLAX) 17 g packet Take 17 g by mouth every other day.   05/18/2020  . sennosides-docusate sodium (SENOKOT-S) 8.6-50 MG tablet Take 2 tablets by mouth daily.   05/18/2020  . simethicone (MYLICON) 125 MG chewable tablet Chew 125 mg by mouth in the morning and at bedtime.   05/19/2020 at Unknown time  . apixaban (ELIQUIS) 5 MG TABS tablet Take 2 tablets (10 mg total) by mouth 2 (two) times daily for 4 doses. (Patient not taking: Reported on 05/20/2020)   Not Taking at Unknown time  . metoprolol tartrate (LOPRESSOR) 25 MG tablet Take 2 tablets (50 mg total) by mouth 2 (two) times daily. (Patient not taking: Reported on 05/20/2020)   Not Taking at Unknown time  Scheduled:  . amLODipine  5 mg Oral Daily  . atorvastatin  40 mg Oral Daily  . enoxaparin (LOVENOX) injection  80 mg Subcutaneous Q12H  . influenza vaccine adjuvanted  0.5 mL Intramuscular Tomorrow-1000  . ipratropium-albuterol  3 mL Nebulization BID  . mouth rinse  15 mL Mouth Rinse BID  . metoprolol tartrate  50 mg Oral BID  . mometasone-formoterol  2 puff Inhalation BID    Assessment: Beverly Howell is a 79 y.o. female admitted on 05/19/2020 with shortness of breath and hemoptysis.  Diagnosed with nectrotizing pneumonia currently treating with Unasyn for MSSA in pleural fluid culture.  Pharmacy consulted on 05/23/20 for full dose Lovenox while prior to admission Apixaban held.  Lovenox for bridging for history of  recent CVA and DVT.   H/H low, stable with Hgb in 9s-10s range, PLTC elevated.  No bleeding reported.   SCr <1, stable.  TCTS noted that patient is not a surgical candidate.  One chest tube removed 12/20.    Goal of Therapy:  Treatment and prevention of venous thrombosis/CVA. Monitor platelets by anticoagulation protocol: Yes   Plan:  Continue Lovenox 80 mg SQ every 12 hours Monitor renal function and for signs/symptoms of bleeding.  Follow up when appropriate to switch back to oral anticoagulation.   Thank you for allowing pharmacy to be part of this patients care team.  Noah Delaine, RPh Clinical Pharmacist [phone] Please check AMION for all [facility] Pharmacy phone numbers After 10:00 PM, call Main Pharmacy [phone] 06/03/2020,8:55 AM

## 2020-06-03 NOTE — Progress Notes (Signed)
Patient put back into bed.  We discussed placing TALC in the existing pigtail chest tube. Patient informed she may experience burning or pain after placement. She was then placed on her left side. 4 grams of TALC slurry placed into right pigtail chest tube. Patient tolerated the procedure well.

## 2020-06-03 NOTE — Progress Notes (Signed)
Occupational Therapy Treatment [name] MRN: [id number] DOB: [birth year] Today's Date: 06/03/2020    History of present illness Pt is a 79 y/o female admitted from [facility] secondary to SOB and cough with hemoptysis. X-rays showed right-sided hydropneumothorax and a CT angiogram was done which shows hydropneumothorax and necrotizing pneumonia on the right side and on the left side with features concerning for previous pneumonia from Covid. Pt is s/p chest tube placement. Pt with recent admission for CVA and COVID. PMH includes HTN and DVT.   OT comments  Patient continues to make steady progress towards goals in skilled OT session. Patient's session encompassed functional mobility in order to progress to walking household distances. Pt remains limited in ambulation, but is progressing more each day and is motivated to continue to progress in therapy. Pt became short of breath after ambulation, however noted no desating on room air. Discharge remains appropriate, therapy will continue to follow.   Follow Up Recommendations  SNF;Supervision/Assistance - 24 hour    Equipment Recommendations  3 in 1 bedside commode;Wheelchair (measurements OT);Wheelchair cushion (measurements OT);Other (comment)    Recommendations for Other Services      Precautions / Restrictions Precautions Precautions: Fall Precaution Comments: Chest tube x1 Restrictions Weight Bearing Restrictions: No       Mobility Bed Mobility               General bed mobility comments: Up in chair upon arrival  Transfers Overall transfer level: Needs assistance Equipment used: Rolling walker (2 wheeled) Transfers: Sit to/from Stand Sit to Stand: Min guard              Balance Overall balance assessment: Needs assistance Sitting-balance support: No upper extremity supported;Feet supported Sitting balance-Leahy Scale: Good     Standing balance support: Bilateral upper extremity  supported Standing balance-Leahy Scale: Poor Standing balance comment: reliant on UE support                           ADL either performed or assessed with clinical judgement   ADL Overall ADL's : Needs assistance/impaired                         Toilet Transfer: Minimal assistance;Ambulation;RW Toilet Transfer Details (indicate cue type and reason): simulated with functional mobility         Functional mobility during ADLs: Rolling walker;Minimal assistance General ADL Comments: pt motivated to compelte functional mobility in session to date     Vision       Perception     Praxis      Cognition Arousal/Alertness: Awake/alert Behavior During Therapy: WFL for tasks assessed/performed;Agitated Overall Cognitive Status: Within Functional Limits for tasks assessed                                          Exercises     Shoulder Instructions       General Comments      Pertinent Vitals/ Pain       Pain Assessment: No/denies pain  Home Living                                          Prior Functioning/Environment  Frequency  Min 2X/week        Progress Toward Goals  OT Goals(current goals can now be found in the care plan section)  Progress towards OT goals: Progressing toward goals  Acute Rehab OT Goals Patient Stated Goal: to walk a little further each day OT Goal Formulation: With patient Time For Goal Achievement: 06/05/20 Potential to Achieve Goals: Good  Plan Discharge plan remains appropriate    Co-evaluation                 AM-PAC OT "6 Clicks" Daily Activity     Outcome Measure   Help from another person eating meals?: None Help from another person taking care of personal grooming?: A Little Help from another person toileting, which includes using toliet, bedpan, or urinal?: A Little Help from another person bathing (including washing, rinsing, drying)?: A  Lot Help from another person to put on and taking off regular upper body clothing?: A Little Help from another person to put on and taking off regular lower body clothing?: A Lot 6 Click Score: 17    End of Session Equipment Utilized During Treatment: Rolling walker;Gait belt  OT Visit Diagnosis: Unsteadiness on feet (R26.81);Other abnormalities of gait and mobility (R26.89);Hemiplegia and hemiparesis;Muscle weakness (generalized) (M62.81);Other symptoms and signs involving cognitive function Hemiplegia - Right/Left: Left Hemiplegia - dominant/non-dominant: Non-Dominant Hemiplegia - caused by: Cerebral infarction   Activity Tolerance Patient tolerated treatment well   Patient Left in chair;with call bell/phone within reach;with chair alarm set   Nurse Communication Mobility status        Time: 3846-6599 OT Time Calculation (min): 22 min  Charges: OT General Charges $OT Visit: 1 Visit OT Treatments $Self Care/Home Management : 8-22 mins  Pollyann Glen E. Shenae Bonanno, COTA/L Acute Rehabilitation Services [phone]   Cherlyn Cushing 06/03/2020, 3:53 PM

## 2020-06-03 NOTE — Progress Notes (Addendum)
      [address]       [place] [address]             [phone]           Subjective: Patient sitting in chair this am. She has no complaint this am.  Objective: Vital signs in last 24 hours: Temp:  [97.3 F (36.3 C)-98.4 F (36.9 C)] 97.9 F (36.6 C) (12/21 0447) Pulse Rate:  [60-100] 72 (12/21 0447) Cardiac Rhythm: Normal sinus rhythm (12/21 0447) Resp:  [17-22] 20 (12/21 0447) BP: (124-144)/(62-95) 131/71 (12/21 0447) SpO2:  [94 %-100 %] 96 % (12/21 0447)     Intake/Output from previous day: 12/20 0701 - 12/21 0700 In: 1240 [P.O.:840; IV Piggyback:400] Out: 130 [Chest Tube:130]   Physical Exam:  Cardiovascular: RRR Pulmonary: Clear on the left and coarse on the right Wounds: Dressing is clean and dry.   Chest Tube: to water seal and there is a +++ air leak with cough  Lab Results: CBC: Recent Labs    06/02/20 0126 06/03/20 0114  WBC 7.3 8.2  HGB 9.6* 10.0*  HCT 31.4* 32.9*  PLT 482* 447*   BMET:  Recent Labs    06/02/20 0126 06/03/20 0114  NA 142 142  K 2.6* 3.8  CL 106 108  CO2 27 24  GLUCOSE 83 87  BUN <5* <5*  CREATININE 0.70 0.76  CALCIUM 7.7* 8.0*    PT/INR: No results for input(s): LABPROT, INR in the last 72 hours. ABG:  INR: Will add last result for INR, ABG once components are confirmed Will add last 4 CBG results once components are confirmed  Assessment/Plan:  1. CV - SR with HR in the 70-80's. On Amlodipine 5 mg daily and Lopressor 50 mg bid;per primary 2.  Pulmonary - On room air. 1 pigtail chest tube removed yesterday. Chest tube output 130 cc for last 24 hours.  Chest tubes to water seal and +++ air leak with cough. CXR this am appears stable (minor, new right lateral chest wall subcutaneous emphysema). Check CXR in am. Patient not a surgical candidate. 3. History of CVA, recent DVT-on Lovenox 80 mg [place] bid 4. ID-on Unasyn for MSSA (previously treated with Vanco and Cefepime). 5. Supplement  potassium  Beverly M ZimmermanPA-C 06/03/2020,7:09 AM [phone]  Now small air leak  With one tube out - will try talc slurry  I have seen and examined Beverly Howell and agree with the above assessment  and plan.  Beverly Ovens MD Beeper [phone] Office [phone] 06/03/2020 12:17 PM

## 2020-06-03 NOTE — TOC Initial Note (Signed)
Transition of Care [facility]) - Initial/Assessment Note    Patient Details  Name: Beverly Howell MRN: [id number] Date of Birth: 20-Jun-1940  Transition of Care [facility]) CM/SW Contact:    Carmina Miller, LCSWA Phone Number: 06/03/2020, 5:20 PM  Clinical Narrative:                 CSW spoke with pt's daughter, Adela Glimpse, via phone in reference to [employer]. [name] stated pt was in [facility] for short term rehab before being admitted. [name] stated pt was in SNF for 20 days and was told pt needed more time. Pt is now in copay status at $184.00 per day. [name] states she is unsure if they will be able to come up with the money or not but will do their best. CSW reached out to [place] to confirm, waiting on a response. Tonya requested an update from MD, CSW sent MD a secure chat. Tonya requested CSW follow up once [facility] [place] confirms as she has been trying to contact [place] as well.    Expected Discharge Plan: Skilled Nursing Facility Barriers to Discharge: Continued Medical Work up   Patient Goals and CMS Choice   CMS Medicare.gov Compare Post Acute Care list provided to:: Patient Represenative (must comment) Choice offered to / list presented to : Adult Children  Expected Discharge Plan and Services Expected Discharge Plan: Skilled Nursing Facility In-house Referral: Clinical Social Work   Post Acute Care Choice: Skilled Nursing Facility Living arrangements for the past 2 months: Skilled Nursing Facility                                      Prior Living Arrangements/Services Living arrangements for the past 2 months: Skilled Nursing Facility Lives with:: Self Patient language and need for interpreter reviewed:: Yes Do you feel safe going back to the place where you live?: Yes      Need for Family Participation in Patient Care: Yes (Comment) Care giver support system in place?: Yes (comment)   Criminal Activity/Legal Involvement Pertinent to Current Situation/Hospitalization:  No - Comment as needed  Activities of Daily Living Home Assistive Devices/Equipment: Cane (specify quad or straight) ADL Screening (condition at time of admission) Patient's cognitive ability adequate to safely complete daily activities?: No Is the patient deaf or have difficulty hearing?: No Does the patient have difficulty seeing, even when wearing glasses/contacts?: No Does the patient have difficulty concentrating, remembering, or making decisions?: Yes Patient able to express need for assistance with ADLs?: Yes Does the patient have difficulty dressing or bathing?: Yes Independently performs ADLs?: No Communication: Independent Dressing (OT): Needs assistance Is this a change from baseline?: Change from baseline, expected to last >3 days Grooming: Needs assistance Is this a change from baseline?: Change from baseline, expected to last >3 days Feeding: Independent Bathing: Needs assistance Is this a change from baseline?: Change from baseline, expected to last >3 days Toileting: Needs assistance Is this a change from baseline?: Change from baseline, expected to last >3days In/Out Bed: Needs assistance Is this a change from baseline?: Change from baseline, expected to last >3 days Walks in Home: Needs assistance Is this a change from baseline?: Change from baseline, expected to last >3 days Does the patient have difficulty walking or climbing stairs?: Yes Weakness of Legs: Left Weakness of Arms/Hands: Left  Permission Sought/Granted Permission sought to share information with : Family Surveyor, minerals granted  to share information with : Yes, Verbal Permission Granted  Share Information with NAME: Adela Glimpse  Permission granted to share info w AGENCY: SNF  Permission granted to share info w Relationship: Daughter  Permission granted to share info w Contact Information: [phone]  Emotional Assessment Appearance:: Appears stated  age Attitude/Demeanor/Rapport: Unable to Assess Affect (typically observed): Calm Orientation: : Oriented to Self,Oriented to Place,Oriented to  Time,Oriented to Situation Alcohol / Substance Use: Not Applicable Psych Involvement: No (comment)  Admission diagnosis:  Shortness of breath [R06.02] Necrotizing pneumonia (HCC) [J85.0] Hydropneumothorax [J94.8] Hypoxia [R09.02] Hemoptysis [R04.2] Dyspnea, unspecified type [R06.00] Patient Active Problem List   Diagnosis Date Noted  . Necrotizing pneumonia (HCC) 05/20/2020  . History of CVA in adulthood 05/20/2020  . History of DVT of lower extremity 05/20/2020  . Acute CVA (cerebrovascular accident) (HCC) 04/22/2020  . Acute respiratory failure due to COVID-19 (HCC) 04/22/2020  . ARF (acute renal failure) (HCC) 04/22/2020  . Essential hypertension 04/22/2020  . Acute respiratory failure (HCC) 04/21/2020   PCP:  System, Provider Not In Pharmacy:   [facility] [address] [place], [place] - [address] [place] [address] Phone: [phone] Fax: [phone]     Social Determinants of Health (SDOH) Interventions    Readmission Risk Interventions No flowsheet data found.

## 2020-06-03 NOTE — Progress Notes (Signed)
Referring Physician(s): Dr. Tyrone Sage  Supervising Physician: Suttle  Patient Status:  [facility] - In-pt  Chief Complaint:  F/U Chest tubes  Brief History:  Beverly Howell is a 79 yo with prior history of severe COVID-19 infection, lower extremity DVT, CVA presented to the hospital with worsening shortness of breath.  She was initially hospitalized from 11/8-11/15 for left-sided weakness due to CVA COVID-19 pneumonia and bilateral lower extremity DVT.   She presented to the ED from [facility] on 12/6 with shortness of breath cough and hemoptysis , CT angiogram on 05/20/2020 showed a large loculated right hydropneumothorax and necrotizing pneumonia.    2 Chest tubes placed in IR by Dr. Elby Showers on 12/8  CT surgery following. One of the tubes removed yesterday. CT Surgery note today reads = On room air. 1 pigtail chest tube removed yesterday. Chest tube output 130 cc for last 24 hours.  Chest tubes to water seal and +++ air leak with cough. CXR this am appears stable (minor, new right lateral chest wall subcutaneous emphysema). Check CXR in am. Patient not a surgical candidate.  Subjective:  Patient sitting up in chair. No complaints.  Allergies: Patient has no known allergies.  Medications: Prior to Admission medications   Medication Sig Start Date End Date Taking? Authorizing Provider  acetaminophen (TYLENOL) 500 MG tablet Take 1,000 mg by mouth in the morning, at noon, and at bedtime.   Yes [provider]  albuterol (VENTOLIN HFA) 108 (90 Base) MCG/ACT inhaler Inhale 2 puffs into the lungs every 8 (eight) hours as needed for wheezing.  05/01/20  Yes [provider]  amLODipine (NORVASC) 5 MG tablet Take 1 tablet (5 mg total) by mouth daily. 04/29/20  Yes Ghimire, Werner Lean, MD  apixaban (ELIQUIS) 5 MG TABS tablet Take 1 tablet (5 mg total) by mouth 2 (two) times daily. Start 11/17 evening. 04/30/20  Yes Ghimire, Werner Lean, MD  atenolol (TENORMIN) 50 MG tablet Take  50 mg by mouth at bedtime. 05/13/20  Yes [provider]  atorvastatin (LIPITOR) 40 MG tablet Take 1 tablet (40 mg total) by mouth daily. 04/29/20  Yes Ghimire, Werner Lean, MD  Calcium Carb-Cholecalciferol (CALCIUM 600-D PO) Take 1 tablet by mouth in the morning and at bedtime.    Yes [provider]  cholecalciferol (VITAMIN D) 25 MCG (1000 UNIT) tablet Take 1,000 Units by mouth daily.    Yes [provider]  dexamethasone (DECADRON) 6 MG tablet Take 6 mg by mouth at bedtime. 05/12/20  Yes [provider]  furosemide (LASIX) 10 MG/ML injection Inject 10 mg into the muscle daily. 05/13/20  Yes [provider]  guaiFENesin (MUCINEX) 600 MG 12 hr tablet Take 1,200 mg by mouth 2 (two) times daily.   Yes [provider]  ipratropium-albuterol (DUONEB) 0.5-2.5 (3) MG/3ML SOLN Take 3 mLs by nebulization 2 (two) times daily as needed. 05/12/20  Yes [provider]  lidocaine (LMX) 4 % cream Apply 1 application topically 2 (two) times daily.   Yes [provider]  Omega-3 Fatty Acids (FISH OIL TRIPLE STRENGTH) 1400 MG CAPS Take 1,400 mg by mouth daily.   Yes [provider]  pantoprazole (PROTONIX) 40 MG tablet Take 1 tablet (40 mg total) by mouth daily. 04/29/20  Yes Ghimire, Werner Lean, MD  polyethylene glycol (MIRALAX / GLYCOLAX) 17 g packet Take 17 g by mouth every other day.   Yes [provider]  sennosides-docusate sodium (SENOKOT-S) 8.6-50 MG tablet Take 2 tablets  by mouth daily.   Yes [provider]  simethicone (MYLICON) 125 MG chewable tablet Chew 125 mg by mouth in the morning and at bedtime.   Yes [provider]  apixaban (ELIQUIS) 5 MG TABS tablet Take 2 tablets (10 mg total) by mouth 2 (two) times daily for 4 doses. Patient not taking: Reported on 05/20/2020 04/28/20 05/20/20  Maretta Bees, MD  metoprolol tartrate (LOPRESSOR) 25 MG tablet Take 2 tablets (50 mg total) by mouth 2 (two)  times daily. Patient not taking: Reported on 05/20/2020 04/28/20   Maretta Bees, MD     Vital Signs: BP (!) 143/87 (BP Location: Left Arm)   Pulse 90   Temp (!) 97.5 F (36.4 C) (Oral)   Resp 20   Ht 5\' 5"  (1.651 m)   Wt 81.5 kg   SpO2 96%   BMI 29.90 kg/m   Physical Exam Vitals reviewed.  Constitutional:      Appearance: Normal appearance.  Cardiovascular:     Rate and Rhythm: Normal rate.  Pulmonary:     Effort: Pulmonary effort is normal. No respiratory distress.     Comments: Chest tube in place. Looks to be in good position. I don't see any holes outside the skin which would cause S emphysema. + Air leak, ~130 mL output. Abdominal:     Palpations: Abdomen is soft.  Neurological:     General: No focal deficit present.     Mental Status: She is alert and oriented to person, place, and time.  Psychiatric:        Mood and Affect: Mood normal.        Behavior: Behavior normal.        Thought Content: Thought content normal.        Judgment: Judgment normal.      Imaging: DG Chest 2 View  Result Date: 06/02/2020 CLINICAL DATA:  Follow-up.  Chest tube. EXAM: CHEST - 2 VIEW COMPARISON:  05/31/2020.  05/30/2020. FINDINGS: Right chest tubes in stable position. No definite pneumothorax noted on today's exam. Persistent right pleural thickening/effusion. Persistent bibasilar atelectasis/infiltrates. Heart size stable. Degenerative change thoracic spine and both shoulders. Mild chest wall subcutaneous emphysema. IMPRESSION: Right chest tubes in stable position. No definite pneumothorax noted on today's exam. Persistent right pleural thickening and bibasilar atelectasis/infiltrates. Electronically Signed   By: 06/01/2020  Register   On: 06/02/2020 08:01   DG Chest Port 1 View  Result Date: 06/03/2020 CLINICAL DATA:  Chest tube EXAM: PORTABLE CHEST 1 VIEW COMPARISON:  Yesterday FINDINGS: One of 2 small bore chest tubes remain in similar position near the diaphragm. There is  new right chest wall emphysema. Relative lucency over the right lung which correlates with a loculated cavity by most recent chest CT. The adjacent compressed/consolidated lung is likely stable. Low volume left chest with mild streaky density at the bases, stable. Normal heart size. IMPRESSION: Interval removal of 1 of the right chest tubes with new chest wall emphysema but stable appearing cavity. Electronically Signed   By: 06/05/2020 M.D.   On: 06/03/2020 06:17   DG CHEST PORT 1 VIEW  Result Date: 05/31/2020 CLINICAL DATA:  Right chest tube.  Pneumothorax. EXAM: PORTABLE CHEST 1 VIEW COMPARISON:  May 30, 2020 FINDINGS: The right chest tube is stable. The small right hydropneumothorax near the base is stable. The cardiomediastinal silhouette and lungs are otherwise unchanged. Opacity in the right base is unchanged. IMPRESSION: 1. Stable right chest tube and stable small right hydropneumothorax.  Persistent opacity in the right base. No other changes. Electronically Signed   By: Gerome Sam III M.D   On: 05/31/2020 10:00    Labs:  CBC: Recent Labs    05/28/20 0135 05/31/20 0125 06/02/20 0126 06/03/20 0114  WBC 8.9 8.1 7.3 8.2  HGB 9.3* 9.1* 9.6* 10.0*  HCT 30.5* 29.0* 31.4* 32.9*  PLT 352 459* 482* 447*    COAGS: Recent Labs    04/21/20 1950 05/20/20 1237 05/22/20 0135 05/22/20 0727 05/22/20 1710 05/23/20 0116  INR 1.5*  --   --   --   --   --   APTT 27   < > 37* 47* 77* 127*   < > = values in this interval not displayed.    BMP: Recent Labs    05/28/20 0135 05/31/20 0125 06/02/20 0126 06/03/20 0114  NA 143 145 142 142  K 4.1 3.6 2.6* 3.8  CL 111 108 106 108  CO2 22 27 27 24   GLUCOSE 88 87 83 87  BUN 7* <5* <5* <5*  CALCIUM 8.5* 8.2* 7.7* 8.0*  CREATININE 0.81 0.76 0.70 0.76  GFRNONAA >60 >60 >60 >60    LIVER FUNCTION TESTS: Recent Labs    05/24/20 0425 05/25/20 0213 05/26/20 0842 05/27/20 0148  BILITOT 0.6 0.4 0.7 0.4  AST 18 19 26 29    ALT 16 14 11 11   ALKPHOS 51 66 70 67  PROT 5.0* 5.7* 5.9* 6.2*  ALBUMIN 1.7* 1.9* 2.1* 2.1*    Assessment and Plan:  S/P Chest tube placement by Dr. 05/29/20 on 05/21/20. One now removed, one still remains. Appears to be in good position. + Air leak. + SQ emphysema.  Care per CT surgery.  Electronically Signed: , PA-C 06/03/2020, 12:57 PM    I spent a total of 15 Minutes at the the patient's bedside AND on the patient's hospital floor or unit, greater than 50% of which was counseling/coordinating care for f/u pigtail chest tubes.

## 2020-06-03 NOTE — Progress Notes (Signed)
PROGRESS NOTE    Beverly Howell  YJE:[id number] DOB: 09/20/1940 DOA: 05/19/2020 PCP: System, Provider Not In    Chief Complaint  Patient presents with  . Shortness of Breath  . Hematemesis    Brief Narrative: 79 year old lady with prior history of severe COVID-19 infection, lower extremity DVT, CVA presented to the hospital with worsening shortness of breath.  She was initially hospitalized from 11/8-11/15 for left-sided weakness due to CVA COVID-19 pneumonia and bilateral lower extremity DVT.   -Presented to the ED from [facility] on 12/6 with shortness of breath cough and hemoptysis , CT angiogram on 05/20/2020 showed a large loculated right hydropneumothorax and necrotizing pneumonia.  CT surgery following -2 Chest tubes placed in IR on 12/7   Assessment & Plan:   MSSA necrotizing pneumonia of the right lung with empyema Hydropneumothorax -S/p chest tube x2  Placed in IR 12/7 -CT surgery following  -Pleural fluid cultures positive for MSSA.   -Followed by ID this admission, antibiotics changed to Unasyn and continue while inpatient and transition to oral Augmentin at discharge for 2 months duration until she sees CT surgery/pulmonary. -1 chest tube removed yesterday( 12/20), has 1 remaining chest tube to waterseal still with some air leak -per TCTS -Ambulate, PT OT as tolerated, once chest tubes out may be able to ambulate more freely and possibly go home instead of rehab  History of recent right MCA CVA with left-sided weakness probably secondary to hypercoagulable state from COVID-19 infection. Currently on therapeutic Lovenox, can transition to oral anticoagulation on discharge. Continue with lipitor.   History of lower extremity DVT  -probably secondary to COVID-19 hypercoagulable state currently on therapeutic anticoagulation with Lovenox, transition back to apixaban at discharge  Hypertension  -Continue with metoprolol 50 mg twice daily and Norvasc 5 mg daily  Anemia  of chronic disease:  Hemoglobin stable between 9 to 10.   Hypokalemia:  Replaced  Recent COVID-19 infection -Out of window for isolation  Debility/deconditioning Therapy evaluations recommending SNF with 24 hours supervision.    DVT prophylaxis-Lovenox Code Status-full code Family Communication-no family at bedside Disposition:   Status is: Inpatient  Remains inpatient appropriate because: Pneumothorax with 2 chest tubes in place.    Dispo: The patient is from: Home              Anticipated d/c is to: SNF versus home with home health              Anticipated d/c date is: Likely 2 to 3 days              Patient currently is not medically stable to d/c.       Consultants:   Cardiothoracic surgery  Infectious disease  IR  Procedures: CT-guided chest tube placement by interventional radiology x2 on 12/7 One chest tube removed 12/20  Antimicrobials: Unasyn  Subjective: -Feels okay, breathing better overall  Objective: Vitals:   06/03/20 0447 06/03/20 0727 06/03/20 0900 06/03/20 1139  BP: 131/71 120/87  (!) 143/87  Pulse: 72 89  90  Resp: 20 19  20   Temp: 97.9 F (36.6 C) (!) 97.5 F (36.4 C)  (!) 97.5 F (36.4 C)  TempSrc: Oral Oral  Oral  SpO2: 96% 93% 100% 96%  Weight:      Height:        Intake/Output Summary (Last 24 hours) at 06/03/2020 1230 Last data filed at 06/03/2020 1100 Gross per 24 hour  Intake 1600 ml  Output 580 ml  Net 1020  ml   Filed Weights   05/20/20 1330 05/21/20 0028  Weight: 98 kg 81.5 kg    Examination:  General exam: Pleasant elderly female sitting up in bed, AAOx3, no distress CVS: S1-S2, regular rate rhythm Lungs: Decreased breath sounds on the right, 1 chest tube to waterseal Abdomen: Soft, nontender, bowel sounds present Extremities: No edema  Skin no rashes on exposed skin Psychiatry: Mood is appropriate   Data Reviewed: I have personally reviewed following labs and imaging studies  CBC: Recent Labs  Lab  05/28/20 0135 05/31/20 0125 06/02/20 0126 06/03/20 0114  WBC 8.9 8.1 7.3 8.2  HGB 9.3* 9.1* 9.6* 10.0*  HCT 30.5* 29.0* 31.4* 32.9*  MCV 99.0 98.3 98.7 98.2  PLT 352 459* 482* 447*    Basic Metabolic Panel: Recent Labs  Lab 05/28/20 0135 05/31/20 0125 06/02/20 0126 06/03/20 0114  NA 143 145 142 142  K 4.1 3.6 2.6* 3.8  CL 111 108 106 108  CO2 22 27 27 24   GLUCOSE 88 87 83 87  BUN 7* <5* <5* <5*  CREATININE 0.81 0.76 0.70 0.76  CALCIUM 8.5* 8.2* 7.7* 8.0*  MG 1.7  --   --   --     GFR: Estimated Creatinine Clearance: 61.1 mL/min (by C-G formula based on SCr of 0.76 mg/dL).  Liver Function Tests: No results for input(s): AST, ALT, ALKPHOS, BILITOT, PROT, ALBUMIN in the last 168 hours.  CBG: No results for input(s): GLUCAP in the last 168 hours.   No results found for this or any previous visit (from the past 240 hour(s)).   Radiology Studies: DG Chest 2 View  Result Date: 06/02/2020 CLINICAL DATA:  Follow-up.  Chest tube. EXAM: CHEST - 2 VIEW COMPARISON:  05/31/2020.  05/30/2020. FINDINGS: Right chest tubes in stable position. No definite pneumothorax noted on today's exam. Persistent right pleural thickening/effusion. Persistent bibasilar atelectasis/infiltrates. Heart size stable. Degenerative change thoracic spine and both shoulders. Mild chest wall subcutaneous emphysema. IMPRESSION: Right chest tubes in stable position. No definite pneumothorax noted on today's exam. Persistent right pleural thickening and bibasilar atelectasis/infiltrates. Electronically Signed   By: 06/01/2020  Register   On: 06/02/2020 08:01   DG Chest Port 1 View  Result Date: 06/03/2020 CLINICAL DATA:  Chest tube EXAM: PORTABLE CHEST 1 VIEW COMPARISON:  Yesterday FINDINGS: One of 2 small bore chest tubes remain in similar position near the diaphragm. 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 Relative lucency over the right lung which correlates with a loculated cavity by most recent chest CT. The  adjacent compressed/consolidated lung is likely stable. Low volume left chest with mild streaky density at the bases, stable. Normal heart size. IMPRESSION: Interval removal of 1 of the right chest tubes with new chest wall emphysema but stable appearing cavity. Electronically Signed   By: 06/05/2020 M.D.   On: 06/03/2020 06:17   Scheduled Meds: . amLODipine  5 mg Oral Daily  . atorvastatin  40 mg Oral Daily  . enoxaparin (LOVENOX) injection  80 mg Subcutaneous Q12H  . influenza vaccine adjuvanted  0.5 mL Intramuscular Tomorrow-1000  . ipratropium-albuterol  3 mL Nebulization BID  . mouth rinse  15 mL Mouth Rinse BID  . metoprolol tartrate  50 mg Oral BID  . mometasone-formoterol  2 puff Inhalation BID  . talc (STERITALC) slurry in NS  4 g Intrapleural Once   Continuous Infusions: . ampicillin-sulbactam (UNASYN) IV 3 g (06/03/20 0921)     LOS: 14 days   06/05/20, MD  [facility]  06/03/2020, 12:30 PM

## 2020-06-04 ENCOUNTER — Inpatient Hospital Stay (HOSPITAL_COMMUNITY): Payer: Medicare (Managed Care)

## 2020-06-04 LAB — CBC WITH DIFFERENTIAL/PLATELET
Abs Immature Granulocytes: 0 10*3/uL (ref 0.00–0.07)
Basophils Absolute: 0 10*3/uL (ref 0.0–0.1)
Basophils Relative: 0 %
Eosinophils Absolute: 0.4 10*3/uL (ref 0.0–0.5)
Eosinophils Relative: 4 %
HCT: 35.8 % — ABNORMAL LOW (ref 36.0–46.0)
Hemoglobin: 10.6 g/dL — ABNORMAL LOW (ref 12.0–15.0)
Lymphocytes Relative: 24 %
Lymphs Abs: 2.4 10*3/uL (ref 0.7–4.0)
MCH: 29.9 pg (ref 26.0–34.0)
MCHC: 29.6 g/dL — ABNORMAL LOW (ref 30.0–36.0)
MCV: 101.1 fL — ABNORMAL HIGH (ref 80.0–100.0)
Monocytes Absolute: 0.3 10*3/uL (ref 0.1–1.0)
Monocytes Relative: 3 %
Neutro Abs: 6.9 10*3/uL (ref 1.7–7.7)
Neutrophils Relative %: 69 %
Platelets: 481 10*3/uL — ABNORMAL HIGH (ref 150–400)
RBC: 3.54 MIL/uL — ABNORMAL LOW (ref 3.87–5.11)
RDW: 16.6 % — ABNORMAL HIGH (ref 11.5–15.5)
WBC: 10 10*3/uL (ref 4.0–10.5)
nRBC: 0 % (ref 0.0–0.2)
nRBC: 0 /100 WBC

## 2020-06-04 LAB — BASIC METABOLIC PANEL
Anion gap: 10 (ref 5–15)
BUN: <5 mg/dL — ABNORMAL LOW (ref 8–23)
CO2: 25 mmol/L (ref 22–32)
Calcium: 8.4 mg/dL — ABNORMAL LOW (ref 8.9–10.3)
Chloride: 105 mmol/L (ref 98–111)
Creatinine, Ser: 0.83 mg/dL (ref 0.44–1.00)
GFR, Estimated: >60 mL/min (ref >60)
Glucose, Bld: 102 mg/dL — ABNORMAL HIGH (ref 70–99)
Potassium: 3.6 mmol/L (ref 3.5–5.1)
Sodium: 140 mmol/L (ref 135–145)

## 2020-06-04 LAB — MAGNESIUM: Magnesium: 1.5 mg/dL — ABNORMAL LOW (ref 1.7–2.4)

## 2020-06-04 NOTE — TOC Progression Note (Signed)
Transition of Care [facility]) - Progression Note    Patient Details  Name: Beverly Howell MRN: [id number] Date of Birth: 1941/02/11  Transition [facility]) CM/SW Contact  Eduard Roux, [place] Phone Number: 06/04/2020, 12:11 PM  Clinical Narrative:     CSW contacted [facility] - advised anticipate discharge per MD note- 2-3 days. SNF will start insurance authorization. Patient will need covid test when closer to discharge readiness.   CSW will continue to follow and assist with discharge planning.  Expected Discharge Plan: Skilled Nursing Facility Barriers to Discharge: Continued Medical Work up  Expected Discharge Plan and Services Expected Discharge Plan: Skilled Nursing Facility In-house Referral: Clinical Social Work   Post Acute Care Choice: Skilled Nursing Facility Living arrangements for the past 2 months: Skilled Nursing Facility                                       Social Determinants of Health (SDOH) Interventions    Readmission Risk Interventions No flowsheet data found.

## 2020-06-04 NOTE — Progress Notes (Signed)
Chest tube removed per MD order without difficulty.  Pt educated on bed rest for an hour and BP's being cycled.  Will continue to monitor.

## 2020-06-04 NOTE — Progress Notes (Addendum)
      [address]       [name] [address]             [phone]           Subjective: Patient sitting in chair this am. She has no complaint this am.  Objective: Vital signs in last 24 hours: Temp:  [97.5 F (36.4 C)-98 F (36.7 C)] 97.9 F (36.6 C) (12/22 0502) Pulse Rate:  [85-97] 85 (12/22 0502) Cardiac Rhythm: Normal sinus rhythm (12/22 0502) Resp:  [18-21] 21 (12/22 0502) BP: (120-148)/(80-87) 145/83 (12/22 0502) SpO2:  [93 %-100 %] 97 % (12/22 0502)     Intake/Output from previous day: 12/21 0701 - 12/22 0700 In: 1343.2 [P.O.:840; IV Piggyback:503.2] Out: 520 [Urine:450; Chest Tube:70]   Physical Exam:  Cardiovascular: RRR Pulmonary: Clear on the left and coarse on the right Wounds: Dressing is clean and dry.   Chest Tube: to water seal and there is a +++ air leak with cough  Lab Results: CBC: Recent Labs    06/02/20 0126 06/03/20 0114  WBC 7.3 8.2  HGB 9.6* 10.0*  HCT 31.4* 32.9*  PLT 482* 447*   BMET:  Recent Labs    06/02/20 0126 06/03/20 0114  NA 142 142  K 2.6* 3.8  CL 106 108  CO2 27 24  GLUCOSE 83 87  BUN <5* <5*  CREATININE 0.70 0.76  CALCIUM 7.7* 8.0*    PT/INR: No results for input(s): LABPROT, INR in the last 72 hours. ABG:  INR: Will add last result for INR, ABG once components are confirmed Will add last 4 CBG results once components are confirmed  Assessment/Plan:  1. CV - SR with HR in the 70-80's. On Amlodipine 5 mg daily and Lopressor 50 mg bid;per primary 2.  Pulmonary - On room air. S/p TALC slurry. Chest tube output 70 cc for last 24 hours.  Chest tubes to water seal and NO air leak. CXR this am appears stable (  right lateral chest wall subcutaneous emphysema). Hope to remove chest tube soon. Check CXR in am. Patient not a surgical candidate. 3. History of CVA, recent DVT-on Lovenox 80 mg [place] bid 4. ID-on Unasyn for MSSA (previously treated with Vanco and Cefepime).   Lelon Huh  ZimmermanPA-C 06/04/2020,7:01 AM [phone]  Chest tube out follow up cxr in am I have seen and examined [name] and agree with the above assessment  and plan.  [name] MD Beeper [phone] Office [phone] 06/04/2020 9:54 PM

## 2020-06-04 NOTE — Progress Notes (Addendum)
PROGRESS NOTE    Beverly Howell  PTW:[id number] DOB: [birth year] DOA: 05/19/2020 PCP: System, Provider Not In    Chief Complaint  Patient presents with  . Shortness of Breath  . Hematemesis    Brief Narrative: 79 year old lady with prior history of severe COVID-19 infection, lower extremity DVT, CVA presented to the hospital with worsening shortness of breath.  She was initially hospitalized from 11/8-11/15 for left-sided weakness due to CVA COVID-19 pneumonia and bilateral lower extremity DVT.   -Presented to the ED from [facility] on 12/6 with shortness of breath cough and hemoptysis , CT angiogram on 05/20/2020 showed a large loculated right hydropneumothorax and necrotizing pneumonia.  CT surgery following -2 Chest tubes placed in IR on 12/7   Assessment & Plan:   MSSA necrotizing pneumonia of the right lung with empyema Hydropneumothorax -S/p chest tube x2  Placed in IR 12/7 -CT surgery was following and signed off on 06/04/2020. -Pleural fluid cultures positive for MSSA.   -Followed by ID this admission, antibiotics changed to Unasyn and continue while inpatient and transition to oral Augmentin at discharge for 2 months duration until she sees CT surgery/pulmonary. -1 chest tube removed yesterday( 12/20), has 1 remaining chest tube to waterseal still with some air leak -per TCTS -Ambulate, PT OT as tolerated, once chest tubes out may be able to ambulate more freely and possibly go home instead of rehab. Management per IR.  History of recent right MCA CVA with left-sided weakness probably secondary to hypercoagulable state from COVID-19 infection. Currently on therapeutic Lovenox, can transition to oral anticoagulation on discharge. Continue with lipitor.   History of lower extremity DVT  -probably secondary to COVID-19 hypercoagulable state currently on therapeutic anticoagulation with Lovenox, transition back to apixaban at discharge  Hypertension  -Blood pressure  controlled. Continue with metoprolol 50 mg twice daily and Norvasc 5 mg daily  Anemia of chronic disease:  Hemoglobin stable between 9 to 10.   Hypokalemia:  Replaced  Recent COVID-19 infection -Out of window for isolation  Debility/deconditioning Therapy evaluations recommending SNF with 24 hours supervision.    DVT prophylaxis-Lovenox Code Status-full code Family Communication-no family at bedside Disposition:   Status is: Inpatient  Remains inpatient appropriate because: Pneumothorax with 2 chest tubes in place.    Dispo: The patient is from: Home              Anticipated d/c is to: SNF versus home with home health              Anticipated d/c date is: Likely 2 to 3 days              Patient currently is not medically stable to d/c.       Consultants:   Cardiothoracic surgery  Infectious disease  IR  Procedures: CT-guided chest tube placement by interventional radiology x2 on 12/7 One chest tube removed 12/20  Antimicrobials: Unasyn  Subjective: Patient seen and examined.  She has no complaints. Denied any pain or any shortness of breath.  Objective: Vitals:   06/04/20 0100 06/04/20 0502 06/04/20 0723 06/04/20 0816  BP: (!) 148/86 (!) 145/83 (!) 143/82   Pulse: 95 85 98   Resp: 19 (!) 21 20   Temp: (!) 97.5 F (36.4 C) 97.9 F (36.6 C) 97.7 F (36.5 C)   TempSrc: Oral Oral Oral   SpO2: 97% 97% 93% 100%  Weight:      Height:        Intake/Output Summary (Last 24  hours) at 06/04/2020 1101 Last data filed at 06/04/2020 0809 Gross per 24 hour  Intake 1343.2 ml  Output 570 ml  Net 773.2 ml   Filed Weights   05/20/20 1330 05/21/20 0028  Weight: 98 kg 81.5 kg    Examination:  General exam:   General exam: Appears calm and comfortable  Respiratory system: Diminished breath sounds at the right base with possible rhonchi. Respiratory effort normal.  Chest tube coming out of the right lateral chest. Cardiovascular system: S1 & S2 heard, RRR.  No JVD, murmurs, rubs, gallops or clicks. No pedal edema. Gastrointestinal system: Abdomen is nondistended, soft and nontender. No organomegaly or masses felt. Normal bowel sounds heard. Central nervous system: Alert and oriented. Left hemiparesis in the left upper extremity Extremities: Symmetric 5 x 5 power. Skin: No rashes, lesions or ulcers.  Psychiatry: Judgement and insight appear normal. Mood & affect appropriate.    Data Reviewed: I have personally reviewed following labs and imaging studies  CBC: Recent Labs  Lab 05/31/20 0125 06/02/20 0126 06/03/20 0114  WBC 8.1 7.3 8.2  HGB 9.1* 9.6* 10.0*  HCT 29.0* 31.4* 32.9*  MCV 98.3 98.7 98.2  PLT 459* 482* 447*    Basic Metabolic Panel: Recent Labs  Lab 05/31/20 0125 06/02/20 0126 06/03/20 0114  NA 145 142 142  K 3.6 2.6* 3.8  CL 108 106 108  CO2 27 27 24   GLUCOSE 87 83 87  BUN <5* <5* <5*  CREATININE 0.76 0.70 0.76  CALCIUM 8.2* 7.7* 8.0*    GFR: Estimated Creatinine Clearance: 61.1 mL/min (by C-G formula based on SCr of 0.76 mg/dL).  Liver Function Tests: No results for input(s): AST, ALT, ALKPHOS, BILITOT, PROT, ALBUMIN in the last 168 hours.  CBG: No results for input(s): GLUCAP in the last 168 hours.   No results found for this or any previous visit (from the past 240 hour(s)).   Radiology Studies: DG CHEST PORT 1 VIEW  Result Date: 06/04/2020 CLINICAL DATA:  Pneumothorax EXAM: PORTABLE CHEST 1 VIEW COMPARISON:  06/03/2020 FINDINGS: Pigtail chest tube at the right lung base. Relative lucency of the right hemithorax as seen previously may reflect residual loculated cavity previously identified on chest CT. Right lung aeration is unchanged. Unchanged right pleural effusion or pleural thickening. Stable aeration of the left lung with probable atelectasis at the bases. Similar right chest wall emphysema. IMPRESSION: Right chest tube in place. Relative lucency of the right hemithorax as seen on prior study may  reflect residual loculated cavity previously identified on chest CT. Lung aeration is unchanged. Electronically Signed   By: 06/05/2020 M.D.   On: 06/04/2020 08:11   DG Chest Port 1 View  Result Date: 06/03/2020 CLINICAL DATA:  Chest tube EXAM: PORTABLE CHEST 1 VIEW COMPARISON:  Yesterday FINDINGS: One of 2 small bore chest tubes remain in similar position near the diaphragm. 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 Relative lucency over the right lung which correlates with a loculated cavity by most recent chest CT. The adjacent compressed/consolidated lung is likely stable. Low volume left chest with mild streaky density at the bases, stable. Normal heart size. IMPRESSION: Interval removal of 1 of the right chest tubes with new chest wall emphysema but stable appearing cavity. Electronically Signed   By: 06/05/2020 M.D.   On: 06/03/2020 06:17   Scheduled Meds: . amLODipine  5 mg Oral Daily  . atorvastatin  40 mg Oral Daily  . enoxaparin (LOVENOX) injection  80 mg Subcutaneous  Q12H  . influenza vaccine adjuvanted  0.5 mL Intramuscular Tomorrow-1000  . ipratropium-albuterol  3 mL Nebulization BID  . mouth rinse  15 mL Mouth Rinse BID  . metoprolol tartrate  50 mg Oral BID  . mometasone-formoterol  2 puff Inhalation BID   Continuous Infusions: . ampicillin-sulbactam (UNASYN) IV 3 g (06/04/20 0459)     LOS: 15 days   Hughie Closs, MD [facility]  06/04/2020, 11:01 AM

## 2020-06-05 ENCOUNTER — Inpatient Hospital Stay (HOSPITAL_COMMUNITY): Payer: Medicare (Managed Care)

## 2020-06-05 LAB — CBC WITH DIFFERENTIAL/PLATELET
Abs Immature Granulocytes: 0 10*3/uL (ref 0.00–0.07)
Basophils Absolute: 0.1 10*3/uL (ref 0.0–0.1)
Basophils Relative: 1 %
Eosinophils Absolute: 0 10*3/uL (ref 0.0–0.5)
Eosinophils Relative: 0 %
HCT: 34.8 % — ABNORMAL LOW (ref 36.0–46.0)
Hemoglobin: 10.7 g/dL — ABNORMAL LOW (ref 12.0–15.0)
Lymphocytes Relative: 18 %
Lymphs Abs: 1.7 10*3/uL (ref 0.7–4.0)
MCH: 30.7 pg (ref 26.0–34.0)
MCHC: 30.7 g/dL (ref 30.0–36.0)
MCV: 99.7 fL (ref 80.0–100.0)
Monocytes Absolute: 0.6 10*3/uL (ref 0.1–1.0)
Monocytes Relative: 6 %
Neutro Abs: 6.9 10*3/uL (ref 1.7–7.7)
Neutrophils Relative %: 75 %
Platelets: 488 10*3/uL — ABNORMAL HIGH (ref 150–400)
RBC: 3.49 MIL/uL — ABNORMAL LOW (ref 3.87–5.11)
RDW: 16.3 % — ABNORMAL HIGH (ref 11.5–15.5)
WBC: 9.2 10*3/uL (ref 4.0–10.5)
nRBC: 0 % (ref 0.0–0.2)
nRBC: 0 /100 WBC

## 2020-06-05 LAB — BASIC METABOLIC PANEL
Anion gap: 10 (ref 5–15)
BUN: <5 mg/dL — ABNORMAL LOW (ref 8–23)
CO2: 26 mmol/L (ref 22–32)
Calcium: 8.4 mg/dL — ABNORMAL LOW (ref 8.9–10.3)
Chloride: 106 mmol/L (ref 98–111)
Creatinine, Ser: 0.87 mg/dL (ref 0.44–1.00)
GFR, Estimated: >60 mL/min (ref >60)
Glucose, Bld: 111 mg/dL — ABNORMAL HIGH (ref 70–99)
Potassium: 3.5 mmol/L (ref 3.5–5.1)
Sodium: 142 mmol/L (ref 135–145)

## 2020-06-05 LAB — MAGNESIUM: Magnesium: 1.4 mg/dL — ABNORMAL LOW (ref 1.7–2.4)

## 2020-06-05 MED ORDER — APIXABAN 5 MG PO TABS
5.0000 mg | ORAL_TABLET | Freq: Two times a day (BID) | ORAL | Status: DC
  Administered 2020-06-05 – 2020-06-06 (×2): 5 mg via ORAL
  Filled 2020-06-05 (×2): qty 1

## 2020-06-05 NOTE — Progress Notes (Addendum)
      [address]       [name] [address]             [phone]           Subjective: Patient just waking up this am. She has no breathing complaint. She wants to know when she can go home.  Objective: Vital signs in last 24 hours: Temp:  [97.6 F (36.4 C)-98 F (36.7 C)] 97.7 F (36.5 C) (12/23 0402) Pulse Rate:  [80-98] 80 (12/23 0402) Cardiac Rhythm: Normal sinus rhythm (12/22 1900) Resp:  [16-21] 16 (12/23 0402) BP: (100-148)/(63-88) 148/85 (12/23 0402) SpO2:  [93 %-100 %] 94 % (12/23 0402)     Intake/Output from previous day: 12/22 0701 - 12/23 0700 In: 480 [P.O.:480] Out: 500 [Urine:500]   Physical Exam:  Cardiovascular: RRR Pulmonary: Clear on the left and coarse on the right Wounds: Dressing is clean and dry.     Lab Results: CBC: Recent Labs    06/03/20 0114 06/04/20 1327  WBC 8.2 10.0  HGB 10.0* 10.6*  HCT 32.9* 35.8*  PLT 447* 481*   BMET:  Recent Labs    06/03/20 0114 06/04/20 1327  NA 142 140  K 3.8 3.6  CL 108 105  CO2 24 25  GLUCOSE 87 102*  BUN <5* <5*  CREATININE 0.76 0.83  CALCIUM 8.0* 8.4*    PT/INR: No results for input(s): LABPROT, INR in the last 72 hours. ABG:  INR: Will add last result for INR, ABG once components are confirmed Will add last 4 CBG results once components are confirmed  Assessment/Plan:  1. CV - SR with HR in the 80-90's. On Amlodipine 5 mg daily and Lopressor 50 mg bid;per primary 2.  Pulmonary - On room air. S/p TALC slurry.  CXR this am shows volume loss at bases, stable,  right pleural effusion/thickening. 3. History of CVA, recent DVT-on Lovenox 80 mg Cassandra bid.Ok to restart Apixaban. 4. ID-on Unasyn for MSSA (previously treated with Vanco and Cefepime) necrotizing pneumonia right lung.   Donielle M ZimmermanPA-C 06/05/2020,7:00 AM [phone]  I have seen and examined [name] and agree with the above assessment  and plan.  [name] MD [facility]  [phone] Office [phone] 06/05/2020 3:48 PM

## 2020-06-05 NOTE — TOC Progression Note (Signed)
Transition of Care [facility]) - Progression Note    Patient Details  Name: Beverly Howell MRN: [id number] Date of Birth: 1941-01-29  [facility]) CM/SW Contact  Eduard Roux, [place] Phone Number: 06/05/2020, 11:18 AM  Clinical Narrative:     CSW spoke with the patient's daughter,Tonya. She informed CSW  patient will be in co-pay status at SNF. SNF did not inform of this information but CSW will call to confirm. She inquired about HH services- CSW explained if she needs someone to sit with the patient (provide supervision) it is not covered by insurance and is private pay. Family wants to know of she can discharge to SNF for 3 days before going home. They are willing to pay copay fees.   CSW contacted Sheliah Hatch  To confirm if co-pay days and if family can pay for 3 days at [facility].Sheliah Hatch will review and call CSW back.  Antony Blackbird, MSW, LCSW Clinical Social Worker   Expected Discharge Plan: Skilled Nursing Facility Barriers to Discharge: Continued Medical Work up  Expected Discharge Plan and Services Expected Discharge Plan: Skilled Nursing Facility In-house Referral: Clinical Social Work   Post Acute Care Choice: Skilled Nursing Facility Living arrangements for the past 2 months: Skilled Nursing Facility                                       Social Determinants of Health (SDOH) Interventions    Readmission Risk Interventions No flowsheet data found.

## 2020-06-05 NOTE — Progress Notes (Signed)
Physical Therapy Treatment [name] MRN: [id number] DOB: [birth year] Today's Date: 06/05/2020    History of Present Illness Pt is a 79 y/o female admitted from SNV secondary to SOB and cough with hemoptysis. X-rays showed right-sided hydropneumothorax and a CT angiogram was done which shows hydropneumothorax and necrotizing pneumonia on the right side and on the left side with features concerning for previous pneumonia from Covid. Pt is s/p chest tube placement. Pt with recent admission for CVA and COVID. PMH includes HTN and DVT.    PT Comments    Pt seated in recliner, Pt progressing well and will update d/c recommendations to home with HHPT.  Pt will also need DME listed below and frequency increased from 2x-3x wk.  Pt will have support from her daughter at home.  Will inform supervising PT of need for change in recommendations at this time.    Follow Up Recommendations  Supervision/Assistance - 24 hour;Home health PT     Equipment Recommendations  Rolling walker with 5" wheels;3in1 (PT) (youth height RW)    Recommendations for Other Services       Precautions / Restrictions Precautions Precautions: Fall Restrictions Weight Bearing Restrictions: No    Mobility  Bed Mobility Overal bed mobility: Needs Assistance             General bed mobility comments: Up in chair upon arrival  Transfers Overall transfer level: Needs assistance Equipment used: Rolling walker (2 wheeled) Transfers: Sit to/from Stand Sit to Stand: Min guard         General transfer comment: Slow to rise, cues for hand placement. Poor eccentric load back to seated surface.  Ambulation/Gait Ambulation/Gait assistance: Min guard Gait Distance (Feet): 60 Feet Assistive device: Rolling walker (2 wheeled) Gait Pattern/deviations: Step-through pattern;Trunk flexed;Decreased stride length Gait velocity: decr   General Gait Details: Cues for upper trunk control and RW  safety.  Pt with mild DOE. HR elevated to 119 bpm.   Stairs             Wheelchair Mobility    Modified Rankin (Stroke Patients Only)       Balance Overall balance assessment: Needs assistance Sitting-balance support: No upper extremity supported;Feet supported Sitting balance-Leahy Scale: Good       Standing balance-Leahy Scale: Fair Standing balance comment: reliant on UE support                            Cognition Arousal/Alertness: Awake/alert Behavior During Therapy: WFL for tasks assessed/performed;Agitated Overall Cognitive Status: Within Functional Limits for tasks assessed                                 General Comments: HOH.      Exercises General Exercises - Lower Extremity Ankle Circles/Pumps: AROM;Both;Supine;20 reps Quad Sets: AROM;Both;10 reps;Supine Long Arc Quad: AROM;Both;10 reps;Seated Heel Slides: AROM;Both;10 reps;Supine Hip ABduction/ADduction: AROM;Both;10 reps;Supine    General Comments        Pertinent Vitals/Pain Pain Assessment: No/denies pain    Home Living                      Prior Function            PT Goals (current goals can now be found in the care plan section) Acute Rehab PT Goals Patient Stated Goal: to walk a little further each day Potential to  Achieve Goals: Fair Progress towards PT goals: Progressing toward goals    Frequency    Min 3X/week      PT Plan Discharge plan needs to be updated    Co-evaluation              AM-PAC PT "6 Clicks" Mobility   Outcome Measure  Help needed turning from your back to your side while in a flat bed without using bedrails?: A Little Help needed moving from lying on your back to sitting on the side of a flat bed without using bedrails?: A Little Help needed moving to and from a bed to a chair (including a wheelchair)?: A Little Help needed standing up from a chair using your arms (e.g., wheelchair or bedside chair)?: A  Little Help needed to walk in hospital room?: A Little Help needed climbing 3-5 steps with a railing? : Total 6 Click Score: 16    End of Session Equipment Utilized During Treatment: Gait belt Activity Tolerance: Patient tolerated treatment well Patient left: in bed;with bed alarm set;with call bell/phone within reach Nurse Communication: Mobility status PT Visit Diagnosis: Muscle weakness (generalized) (M62.81);Unsteadiness on feet (R26.81) Hemiplegia - Right/Left: Left Hemiplegia - dominant/non-dominant: Non-dominant Hemiplegia - caused by: Cerebral infarction     Time: 1444-1500 PT Time Calculation (min) (ACUTE ONLY): 16 min  Charges:  $Gait Training: 8-22 mins                     Beverly Howell , PTA Acute Rehabilitation Services Pager [phone] Office [phone]     Beverly Howell 06/05/2020, 3:25 PM

## 2020-06-05 NOTE — TOC Initial Note (Signed)
Transition of Care [facility]) - Initial/Assessment Note    Patient Details  Name: Beverly Howell MRN: [id number] Date of Birth: [birth year]  Transition of Care [facility]) CM/SW Contact:    Beverly Bridgeman, RN Phone Number: 06/05/2020, 2:31 PM  Clinical Narrative:                 Case management spoke with Beverly Howell, MSW, and the patient and family have declined SNF admission due to cost of co-pay days with insurance.  The family, Beverly Howell, would like the patient to discharge home with her to the listed address on the facesheet and she will provide 24 hour supervision and be set up with home health services.  The family was offer Medicare choice relating to home health and dme and the daughter, Beverly Howell, did not have a preference.    I called and spoke with Beverly Howell and the patient was accepted for home health services including RN, PT, OT, aide.  PT is going to work with him today - regarding possible need for [facility] and she will reach out and let me know dme needs - otherwise Rw and 3:1 were ordered from [employer] to be delivered to the room today.  TOC will continue to follow for discharge needs.  Expected Discharge Plan: Home w Home Health Services Barriers to Discharge: Continued Medical Work up   Patient Goals and CMS Choice Patient states their goals for this hospitalization and ongoing recovery are:: Patient declined SNF placement and plans to discharge home with home health. CMS Medicare.gov Compare Post Acute Care list provided to:: Patient Represenative (must comment) (daughter Beverly Howell - [phone]) Choice offered to / list presented to : Adult Children  Expected Discharge Plan and Services Expected Discharge Plan: Home w Home Health Services In-house Referral: Clinical Social Work Discharge Planning Services: CM Consult Post Acute Care Choice: Home Health Living arrangements for the past 2 months: Single Family Home                 DME Arranged: 3-N-1,Walker rolling DME  Agency: [employer] Date DME Agency Contacted: 06/05/20 Time DME Agency Contacted: [phone] Representative spoke with at DME Agency: Beverly Howell, CM with [employer] HH Arranged: RN,PT,OT,Nurse's Aide HH Agency: [facility] Date [facility] Agency Contacted: 06/05/20 Time HH Agency Contacted: 1429 Representative spoke with at [facility] Agency: Beverly Howell  Prior Living Arrangements/Services Living arrangements for the past 2 months: Single Family Home Lives with:: Adult Children (daughter planning to care for the patient at home listed on facesheet) Patient language and need for interpreter reviewed:: Yes Do you feel safe going back to the place where you live?: Yes      Need for Family Participation in Patient Care: Yes (Comment) Care giver support system in place?: Yes (comment)   Criminal Activity/Legal Involvement Pertinent to Current Situation/Hospitalization: No - Comment as needed  Activities of Daily Living Home Assistive Devices/Equipment: Cane (specify quad or straight) ADL Screening (condition at time of admission) Patient's cognitive ability adequate to safely complete daily activities?: No Is the patient deaf or have difficulty hearing?: No Does the patient have difficulty seeing, even when wearing glasses/contacts?: No Does the patient have difficulty concentrating, remembering, or making decisions?: Yes Patient able to express need for assistance with ADLs?: Yes Does the patient have difficulty dressing or bathing?: Yes Independently performs ADLs?: No Communication: Independent Dressing (OT): Needs assistance Is this a change from baseline?: Change from baseline, expected to last >3 days Grooming: Needs assistance Is this a change  from baseline?: Change from baseline, expected to last >3 days Feeding: Independent Bathing: Needs assistance Is this a change from baseline?: Change from baseline, expected to last >3 days Toileting: Needs assistance Is this a change from baseline?:  Change from baseline, expected to last >3days In/Out Bed: Needs assistance Is this a change from baseline?: Change from baseline, expected to last >3 days Walks in Home: Needs assistance Is this a change from baseline?: Change from baseline, expected to last >3 days Does the patient have difficulty walking or climbing stairs?: Yes Weakness of Legs: Left Weakness of Arms/Hands: Left  Permission Sought/Granted Permission sought to share information with : Case Manager Permission granted to share information with : Yes, Release of Information Signed  Share Information with NAME: Beverly Howell  Permission granted to share info w AGENCY: Home Health Agency, [employer] for dme  Permission granted to share info w Relationship: Beverly Howell - daughter - [phone]  Permission granted to share info w Contact Information: [phone]  Emotional Assessment Appearance:: Appears stated age Attitude/Demeanor/Rapport: Unable to Assess Affect (typically observed): Accepting Orientation: : Oriented to Self,Oriented to Place,Oriented to  Time,Oriented to Situation Alcohol / Substance Use: Not Applicable Psych Involvement: No (comment)  Admission diagnosis:  Shortness of breath [R06.02] Necrotizing pneumonia (HCC) [J85.0] Hydropneumothorax [J94.8] Hypoxia [R09.02] Hemoptysis [R04.2] Dyspnea, unspecified type [R06.00] Patient Active Problem List   Diagnosis Date Noted  . Necrotizing pneumonia (HCC) 05/20/2020  . History of CVA in adulthood 05/20/2020  . History of DVT of lower extremity 05/20/2020  . Acute CVA (cerebrovascular accident) (HCC) 04/22/2020  . Acute respiratory failure due to COVID-19 (HCC) 04/22/2020  . ARF (acute renal failure) (HCC) 04/22/2020  . Essential hypertension 04/22/2020  . Acute respiratory failure (HCC) 04/21/2020   PCP:  System, Provider Not In Pharmacy:   [facility] [address] [place], [place] - [address] [place]  [address] Phone: [phone] Fax: [phone]     Social Determinants of Health (SDOH) Interventions    Readmission Risk Interventions Readmission Risk Prevention Plan 06/05/2020  Transportation Screening Complete  PCP or Specialist Appt within 5-7 Days Complete  Home Care Screening Complete  Medication Review (RN CM) Complete  Some recent data might be hidden

## 2020-06-05 NOTE — Progress Notes (Addendum)
PROGRESS NOTE    Beverly Howell  INO:[id number] DOB: 06-Sep-1940 DOA: 05/19/2020 PCP: System, Provider Not In    Chief Complaint  Patient presents with  . Shortness of Breath  . Hematemesis    Brief Narrative: 79 year old lady with prior history of severe COVID-19 infection, lower extremity DVT, CVA presented to the hospital with worsening shortness of breath.  She was initially hospitalized from 11/8-11/15 for left-sided weakness due to CVA COVID-19 pneumonia and bilateral lower extremity DVT.   -Presented to the ED from [facility] on 12/6 with shortness of breath cough and hemoptysis , CT angiogram on 05/20/2020 showed a large loculated right hydropneumothorax and necrotizing pneumonia.  CT surgery consulted.  IR placed 2 chest tubes on 05/20/2020.  ID was also consulted.  Pleural fluid culture were positive for MSSA.  Her antibiotics were changed to Unasyn.  1 chest tube was removed on 06/03/2019 and the other 1 was removed on 06/04/2020.  ID recommended continuing IV antibiotics while inpatient and discharging on Augmentin for 2 more months.  Patient was evaluated by PT OT.   Assessment & Plan:   MSSA necrotizing pneumonia of the right lung with empyema Hydropneumothorax -S/p chest tube x2  Placed in IR 12/7 -CT surgery was following and signed off on 06/04/2020. -Pleural fluid cultures positive for MSSA.   -Followed by ID this admission, antibiotics changed to Unasyn and continue while inpatient and transition to oral Augmentin at discharge for 2 months duration until she sees CT surgery/pulmonary. -1 chest tube removed yesterday( 12/20), and the other one was removed on 06/04/2020.  Patient has been cleared by CT surgery to be discharged.  She has been evaluated by PT OT who recommends either 24-hour supervision or SNF.  I discussed with patient's daughter over the phone while in patient's room, she wanted to talk to social worker to make the best decision.  I reached out to the social  worker to give a call to the daughter as well.  History of recent right MCA CVA with left-sided weakness probably secondary to hypercoagulable state from COVID-19 infection. Currently on therapeutic Lovenox, can transition to oral anticoagulation on discharge. Continue with lipitor.   History of lower extremity DVT  -probably secondary to COVID-19 hypercoagulable state currently on therapeutic anticoagulation with Lovenox, will transition to Eliquis today.  Hypertension  -Blood pressure controlled. Continue with metoprolol 50 mg twice daily and Norvasc 5 mg daily  Anemia of chronic disease:  Hemoglobin stable between 9 to 10.   Hypokalemia:  Replaced  Recent COVID-19 infection -Out of window for isolation  Debility/deconditioning Therapy evaluations recommending SNF with 24 hours supervision.   Hypomagnesemia: Replace through IV.  Recheck in the morning.  DVT prophylaxis-Lovenox Code Status-full code Family Communication-no family at bedside but had a lengthy discussion with patient's daughter over the phone while in patient's room. Disposition:   Status is: Inpatient  Remains inpatient appropriate because: Pneumothorax with 2 chest tubes in place.    Dispo: The patient is from: Home              Anticipated d/c is to: SNF versus home with home health              Anticipated d/c date is: Hopefully tomorrow              Patient currently is medically stable to d/c.       Consultants:   Cardiothoracic surgery  Infectious disease  IR  Procedures: CT-guided chest tube placement by  interventional radiology x2 on 12/7 One chest tube removed 12/20  Antimicrobials: Unasyn  Subjective: Patient seen and examined.  She has no complaints.  She was off of oxygen and denied any chest pain or shortness of breath.  Objective: Vitals:   06/05/20 0002 06/05/20 0402 06/05/20 0739 06/05/20 1238  BP: 129/69 (!) 148/85 126/87 (!) 165/98  Pulse: 87 80 99 95  Resp: 19 16 18  18   Temp: 97.7 F (36.5 C) 97.7 F (36.5 C) (!) 97.4 F (36.3 C) 98.2 F (36.8 C)  TempSrc: Oral Oral Oral Oral  SpO2: 94% 94% 96% 96%  Weight:      Height:        Intake/Output Summary (Last 24 hours) at 06/05/2020 1338 Last data filed at 06/05/2020 1241 Gross per 24 hour  Intake 240 ml  Output 800 ml  Net -560 ml   Filed Weights   05/20/20 1330 05/21/20 0028  Weight: 98 kg 81.5 kg    Examination:  General exam:   General exam: Appears calm and comfortable  Respiratory system: Clear to auscultation. Respiratory effort normal. Cardiovascular system: S1 & S2 heard, RRR. No JVD, murmurs, rubs, gallops or clicks. No pedal edema. Gastrointestinal system: Abdomen is nondistended, soft and nontender. No organomegaly or masses felt. Normal bowel sounds heard. Central nervous system: Alert and oriented.  Left upper extremity hemiparesis Extremities: Symmetric 5 x 5 power. Skin: No rashes, lesions or ulcers.  Psychiatry: Judgement and insight appear normal. Mood & affect appropriate.    Data Reviewed: I have personally reviewed following labs and imaging studies  CBC: Recent Labs  Lab 05/31/20 0125 06/02/20 0126 06/03/20 0114 06/04/20 1327 06/05/20 1125  WBC 8.1 7.3 8.2 10.0 9.2  NEUTROABS  --   --   --  6.9 6.9  HGB 9.1* 9.6* 10.0* 10.6* 10.7*  HCT 29.0* 31.4* 32.9* 35.8* 34.8*  MCV 98.3 98.7 98.2 101.1* 99.7  PLT 459* 482* 447* 481* 488*    Basic Metabolic Panel: Recent Labs  Lab 05/31/20 0125 06/02/20 0126 06/03/20 0114 06/04/20 1327 06/05/20 1125  NA 145 142 142 140 142  K 3.6 2.6* 3.8 3.6 3.5  CL 108 106 108 105 106  CO2 27 27 24 25 26   GLUCOSE 87 83 87 102* 111*  BUN <5* <5* <5* <5* <5*  CREATININE 0.76 0.70 0.76 0.83 0.87  CALCIUM 8.2* 7.7* 8.0* 8.4* 8.4*  MG  --   --   --  1.5* 1.4*    GFR: Estimated Creatinine Clearance: 56.2 mL/min (by C-G formula based on SCr of 0.87 mg/dL).  Liver Function Tests: No results for input(s): AST, ALT,  ALKPHOS, BILITOT, PROT, ALBUMIN in the last 168 hours.  CBG: No results for input(s): GLUCAP in the last 168 hours.   No results found for this or any previous visit (from the past 240 hour(s)).   Radiology Studies: DG Chest 2 View  Result Date: 06/05/2020 CLINICAL DATA:  Chest tube removal EXAM: CHEST - 2 VIEW COMPARISON:  06/04/2020 FINDINGS: Chest tube is been removed from the inferior right chest. There is slight worsening of volume loss at both lung bases. On the right, loculated pleural air space remains present. This has not apparently increased or developed more mass effect. Pleural density on the right appears similar. IMPRESSION: 1. Chest tube removed from the inferior right chest. Slight worsening of volume loss at both lung bases. 2. No change in loculated right pleural air space and right pleural fluid. Electronically Signed  By: Paulina Fusi M.D.   On: 06/05/2020 08:21   DG CHEST PORT 1 VIEW  Result Date: 06/04/2020 CLINICAL DATA:  Pneumothorax EXAM: PORTABLE CHEST 1 VIEW COMPARISON:  06/03/2020 FINDINGS: Pigtail chest tube at the right lung base. Relative lucency of the right hemithorax as seen previously may reflect residual loculated cavity previously identified on chest CT. Right lung aeration is unchanged. Unchanged right pleural effusion or pleural thickening. Stable aeration of the left lung with probable atelectasis at the bases. Similar right chest wall emphysema. IMPRESSION: Right chest tube in place. Relative lucency of the right hemithorax as seen on prior study may reflect residual loculated cavity previously identified on chest CT. Lung aeration is unchanged. Electronically Signed   By: Guadlupe Spanish M.D.   On: 06/04/2020 08:11   Scheduled Meds: . amLODipine  5 mg Oral Daily  . atorvastatin  40 mg Oral Daily  . enoxaparin (LOVENOX) injection  80 mg Subcutaneous Q12H  . influenza vaccine adjuvanted  0.5 mL Intramuscular Tomorrow-1000  . ipratropium-albuterol  3 mL  Nebulization BID  . mouth rinse  15 mL Mouth Rinse BID  . metoprolol tartrate  50 mg Oral BID  . mometasone-formoterol  2 puff Inhalation BID   Continuous Infusions: . ampicillin-sulbactam (UNASYN) IV 3 g (06/05/20 1058)     LOS: 16 days   Hughie Closs, MD [facility]  06/05/2020, 1:38 PM

## 2020-06-05 NOTE — Discharge Instructions (Signed)
ACTIVITY:  1.Increase activity slowly. 2.Walk daily and increase frequency and duration as tolerates. 3.May walk up steps. 4.No lifting more than ten pounds for two weeks. 5.No driving for two weeks. 6.STOP any activity that causes chest pain, shortness of breath, dizziness,sweating,     or excessive weakness. 7.Continue with breathing exercises daily.   WOUND:  1.May shower. 2.Clean wounds with mild soap and water.  Call the office at [phone] if any problems arise.  Information on my medicine - ELIQUIS (apixaban)  This medication education was reviewed with me or my healthcare representative as part of my discharge preparation.  You were taking this medication prior to this hospital admission. On  Apixaban for CVA (stroke) and recent DVT (deep vein thrombosis /blood clot).   Why was Eliquis prescribed for you? Eliquis was prescribed for you to reduce the risk of a blood clot forming that can cause a stroke and prevention of recurrent deep vein thrombosis (blood clots).  What do You need to know about Eliquis ? Take your Eliquis TWICE DAILY - one tablet in the morning and one tablet in the evening with or without food. If you have difficulty swallowing the tablet whole please discuss with your pharmacist how to take the medication safely.  Take Eliquis exactly as prescribed by your doctor and DO NOT stop taking Eliquis without talking to the doctor who prescribed the medication.  Stopping may increase your risk of developing a stroke.  Refill your prescription before you run out.  After discharge, you should have regular check-up appointments with your healthcare provider that is prescribing your Eliquis.  In the future your dose may need to be changed if your kidney function or weight changes by a significant amount or as you get older.  What do you do if you miss a dose? If you miss a dose, take it as soon as you remember on the same day and resume taking twice daily.   Do not take more than one dose of ELIQUIS at the same time to make up a missed dose.  Important Safety Information A possible side effect of Eliquis is bleeding. You should call your healthcare provider right away if you experience any of the following: ? Bleeding from an injury or your nose that does not stop. ? Unusual colored urine (red or dark brown) or unusual colored stools (red or black). ? Unusual bruising for unknown reasons. ? A serious fall or if you hit your head (even if there is no bleeding).  Some medicines may interact with Eliquis and might increase your risk of bleeding or clotting while on Eliquis. To help avoid this, consult your healthcare provider or pharmacist prior to using any new prescription or non-prescription medications, including herbals, vitamins, non-steroidal anti-inflammatory drugs (NSAIDs) and supplements.  This website has more information on Eliquis (apixaban): http://www.eliquis.com/eliquis/home

## 2020-06-05 NOTE — TOC Progression Note (Signed)
Transition of Care [facility]) - Progression Note    Patient Details  Name: Beverly Howell MRN: [id number] Date of Birth: 06-Dec-1940  Transition of Care [facility]) CM/SW Contact  Eduard Roux, [place] Phone Number: 06/05/2020, 1:32 PM  Clinical Narrative:     Received call form patient's daughter,Beverly Howell. She has informed CSW family has decided to take patient home.  RNCM Marcelino Duster and RN - updated   Clinical Social Worker will sign off for now as social work intervention is no longer needed. Please consult [place] if new need arises.    Antony Blackbird, LCSWA Clinical Social Worker [phone]   Antony Blackbird, MSW, LCSW Clinical Social Worker   Expected Discharge Plan: Skilled Nursing Facility Barriers to Discharge: Continued Medical Work up  Expected Discharge Plan and Services Expected Discharge Plan: Skilled Nursing Facility In-house Referral: Clinical Social Work   Post Acute Care Choice: Skilled Nursing Facility Living arrangements for the past 2 months: Skilled Nursing Facility                                       Social Determinants of Health (SDOH) Interventions    Readmission Risk Interventions No flowsheet data found.

## 2020-06-06 ENCOUNTER — Inpatient Hospital Stay (HOSPITAL_COMMUNITY): Payer: Medicare (Managed Care)

## 2020-06-06 LAB — CBC WITH DIFFERENTIAL/PLATELET
Abs Immature Granulocytes: 0.03 10*3/uL (ref 0.00–0.07)
Basophils Absolute: 0 10*3/uL (ref 0.0–0.1)
Basophils Relative: 1 %
Eosinophils Absolute: 0.2 10*3/uL (ref 0.0–0.5)
Eosinophils Relative: 2 %
HCT: 31.9 % — ABNORMAL LOW (ref 36.0–46.0)
Hemoglobin: 10.2 g/dL — ABNORMAL LOW (ref 12.0–15.0)
Immature Granulocytes: 0 %
Lymphocytes Relative: 30 %
Lymphs Abs: 2.5 10*3/uL (ref 0.7–4.0)
MCH: 30.7 pg (ref 26.0–34.0)
MCHC: 32 g/dL (ref 30.0–36.0)
MCV: 96.1 fL (ref 80.0–100.0)
Monocytes Absolute: 1 10*3/uL (ref 0.1–1.0)
Monocytes Relative: 11 %
Neutro Abs: 4.7 10*3/uL (ref 1.7–7.7)
Neutrophils Relative %: 56 %
Platelets: 480 10*3/uL — ABNORMAL HIGH (ref 150–400)
RBC: 3.32 MIL/uL — ABNORMAL LOW (ref 3.87–5.11)
RDW: 16.2 % — ABNORMAL HIGH (ref 11.5–15.5)
WBC: 8.3 10*3/uL (ref 4.0–10.5)
nRBC: 0 % (ref 0.0–0.2)

## 2020-06-06 LAB — BASIC METABOLIC PANEL
Anion gap: 13 (ref 5–15)
BUN: <5 mg/dL — ABNORMAL LOW (ref 8–23)
CO2: 22 mmol/L (ref 22–32)
Calcium: 8.2 mg/dL — ABNORMAL LOW (ref 8.9–10.3)
Chloride: 108 mmol/L (ref 98–111)
Creatinine, Ser: 0.78 mg/dL (ref 0.44–1.00)
GFR, Estimated: >60 mL/min (ref >60)
Glucose, Bld: 82 mg/dL (ref 70–99)
Potassium: 3.1 mmol/L — ABNORMAL LOW (ref 3.5–5.1)
Sodium: 143 mmol/L (ref 135–145)

## 2020-06-06 LAB — MAGNESIUM: Magnesium: 1.4 mg/dL — ABNORMAL LOW (ref 1.7–2.4)

## 2020-06-06 MED ORDER — MAGNESIUM SULFATE 2 GM/50ML IV SOLN
2.0000 g | Freq: Once | INTRAVENOUS | Status: AC
  Administered 2020-06-06: 10:00:00 2 g via INTRAVENOUS
  Filled 2020-06-06: qty 50

## 2020-06-06 MED ORDER — AMOXICILLIN-POT CLAVULANATE 875-125 MG PO TABS: 1.0000 | ORAL_TABLET | Freq: Two times a day (BID) | ORAL | 0 refills | Status: DC

## 2020-06-06 MED ORDER — POTASSIUM CHLORIDE CRYS ER 20 MEQ PO TBCR
40.0000 meq | EXTENDED_RELEASE_TABLET | ORAL | Status: AC
  Administered 2020-06-06 (×2): 40 meq via ORAL
  Filled 2020-06-06 (×2): qty 2

## 2020-06-06 NOTE — TOC Transition Note (Signed)
Transition of Care (TOC) - CM/SW Discharge Note Donn Pierini RN, BSN Transitions of Care Unit 4E- RN Case Manager See Treatment Team for direct phone #    Patient Details  Name: Beverly Howell MRN: [id number] Date of Birth: 06-01-1941  Transition of Care [facility] CM/SW Contact:  Darrold Span, RN Phone Number: 06/06/2020, 10:09 AM   Clinical Narrative:    Pt stable for transition home today, confirmed with bedside RN- DME has been delivered to bedside for home. HH has been set up with [facility] - they will f/u with pt post discharge for start of care.  Family to transport home.    Final next level of care: Home w Home Health Services Barriers to Discharge: Barriers Resolved   Patient Goals and CMS Choice Patient states their goals for this hospitalization and ongoing recovery are:: Patient declined SNF placement and plans to discharge home with home health. CMS Medicare.gov Compare Post Acute Care list provided to:: Patient Represenative (must comment) (daughter [name] - [phone]) Choice offered to / list presented to : Adult Children  Discharge Placement               Home with [facility]        Discharge Plan and Services In-house Referral: Clinical Social Work Discharge Planning Services: CM Consult Post Acute Care Choice: Home Health          DME Arranged: 3-N-1,Walker rolling DME Agency: [employer] Date DME Agency Contacted: 06/05/20 Time DME Agency Contacted: [phone] Representative spoke with at DME Agency: Maud Deed, CM with [employer] HH Arranged: RN,PT,OT,Nurse's Aide HH Agency: [facility] Date [facility] Agency Contacted: 06/05/20 Time HH Agency Contacted: [phone] Representative spoke with at [facility] Agency: Delila Spence  Social Determinants of Health (SDOH) Interventions     Readmission Risk Interventions Readmission Risk Prevention Plan 06/05/2020  Transportation Screening Complete  PCP or Specialist Appt within 5-7 Days Complete  Home Care Screening  Complete  Medication Review (RN CM) Complete  Some recent data might be hidden

## 2020-06-06 NOTE — Plan of Care (Signed)
Plan of care reviewed. Pt's been progressing.    Problem: Clinical Measurements: Goal: Respiratory complications will improve Outcome: Progressing   Problem: Clinical Measurements: Goal: Cardiovascular complication will be avoided Outcome: Progressing   Problem: Nutrition: Goal: Adequate nutrition will be maintained Outcome: Progressing   Problem: Activity: Goal: Risk for activity intolerance will decrease Outcome: Progressing   Problem: Pain Managment: Goal: General experience of comfort will improve Outcome: Progressing   Problem: Skin Integrity: Goal: Risk for impaired skin integrity will decrease Outcome: Progressing   She's hemodynamically stable. Remained afebrile. No distress noted.   Filiberto Pinks, RN

## 2020-06-06 NOTE — Progress Notes (Signed)
Physical Therapy Treatment [name] MRN: [id number] DOB: 05-06-1941 Today's Date: 06/06/2020    History of Present Illness Pt is a 79 y/o female admitted from [facility] secondary to SOB and cough with hemoptysis. X-rays showed right-sided hydropneumothorax and a CT angiogram was done which shows hydropneumothorax and necrotizing pneumonia on the right side and on the left side with features concerning for previous pneumonia from Covid. Pt is s/p chest tube placement. Pt with recent admission for CVA and COVID. PMH includes HTN and DVT.    PT Comments    Pt supine in bed on arrival.  Pt eager to return home.  Pt stood x1 and PTA adjusted personal RW to fit height.  After second standing trial performed gt.  Pt presented with increased WOB and required return back to room.  HR elevated >150 bpm.  SPO2 stable @ 97%.  POst gt training seated in recliner with multiple runs of vtach.  RN informed. Plan for return home today per orders.    Follow Up Recommendations  Supervision/Assistance - 24 hour;Home health PT     Equipment Recommendations  Rolling walker with 5" wheels;3in1 (PT) (youth height RW.)    Recommendations for Other Services       Precautions / Restrictions Precautions Precautions: Fall Precaution Comments: Chest tube x1 Restrictions Weight Bearing Restrictions: No    Mobility  Bed Mobility Overal bed mobility: Needs Assistance Bed Mobility: Supine to Sit Rolling: Supervision         General bed mobility comments: Use of rail to move to edge of bed.  Transfers Overall transfer level: Needs assistance Equipment used: Rolling walker (2 wheeled) Transfers: Sit to/from Stand Sit to Stand: Supervision Stand pivot transfers: Supervision       General transfer comment: Slow to rise, cues for hand placement. Poor eccentric load back to seated surface.  Ambulation/Gait Ambulation/Gait assistance: Min guard Gait Distance (Feet): 60  Feet Assistive device: Rolling walker (2 wheeled) Gait Pattern/deviations: Step-through pattern;Trunk flexed;Decreased stride length Gait velocity: decr   General Gait Details: X 1 standing break after 30 ft with DOE.  HR elevated to greater than 150 bpm.  SPO2 stable at 97% on RA. Post gt training telemetry showing runs of vtach.  RN informed.   Stairs             Wheelchair Mobility    Modified Rankin (Stroke Patients Only)       Balance Overall balance assessment: Needs assistance Sitting-balance support: No upper extremity supported;Feet supported Sitting balance-Leahy Scale: Good       Standing balance-Leahy Scale: Fair Standing balance comment: reliant on UE support                            Cognition Arousal/Alertness: Awake/alert Behavior During Therapy: WFL for tasks assessed/performed;Agitated Overall Cognitive Status: Within Functional Limits for tasks assessed                                 General Comments: HOH.      Exercises      General Comments        Pertinent Vitals/Pain Pain Assessment: No/denies pain    Home Living                      Prior Function            PT Goals (  current goals can now be found in the care plan section) Acute Rehab PT Goals Patient Stated Goal: to go home. Potential to Achieve Goals: Fair Progress towards PT goals: Progressing toward goals    Frequency    Min 3X/week      PT Plan Current plan remains appropriate    Co-evaluation              AM-PAC PT "6 Clicks" Mobility   Outcome Measure  Help needed turning from your back to your side while in a flat bed without using bedrails?: A Little Help needed moving from lying on your back to sitting on the side of a flat bed without using bedrails?: A Little Help needed moving to and from a bed to a chair (including a wheelchair)?: A Little Help needed standing up from a chair using your arms (e.g.,  wheelchair or bedside chair)?: A Little Help needed to walk in hospital room?: A Little Help needed climbing 3-5 steps with a railing? : A Little 6 Click Score: 18    End of Session Equipment Utilized During Treatment: Gait belt Activity Tolerance: Patient tolerated treatment well Patient left: in bed;with bed alarm set;with call bell/phone within reach Nurse Communication: Mobility status PT Visit Diagnosis: Muscle weakness (generalized) (M62.81);Unsteadiness on feet (R26.81) Hemiplegia - Right/Left: Left Hemiplegia - dominant/non-dominant: Non-dominant Hemiplegia - caused by: Cerebral infarction     Time: 1003-1018 PT Time Calculation (min) (ACUTE ONLY): 15 min  Charges:  $Gait Training: 8-22 mins                     Bonney Leitz , PTA Acute Rehabilitation Services Pager [phone] Office [phone]     Winston Misner Artis Delay 06/06/2020, 10:32 AM

## 2020-06-06 NOTE — Progress Notes (Signed)
Pt discharged home with daughter. IV and telemetry box removed. Pt and pt's daughter received discharge instructions and all questions were answered. Pt left with BSC and rolling walker. Pt discharged via wheelchair and was accompanied by this RN and NS.

## 2020-06-06 NOTE — Progress Notes (Signed)
Occupational Therapy Treatment [name] MRN: [id number] DOB: Sep 27, 1940 Today's Date: 06/06/2020    History of present illness Pt is a 79 y/o female admitted from [facility] secondary to SOB and cough with hemoptysis. X-rays showed right-sided hydropneumothorax and a CT angiogram was done which shows hydropneumothorax and necrotizing pneumonia on the right side and on the left side with features concerning for previous pneumonia from Covid. Pt is s/p chest tube placement. Pt with recent admission for CVA and COVID. PMH includes HTN and DVT.   OT comments  Pt noted to be DC'ing home today with daughter. Session focus on BADL reeducation as pt anxious to DC home. Pt currently requires MIN a for UB ADLs and MOD A for LB ADLs. Pt completed x2 sit<>stands from EOB with min guard needing at least one UE supported during dynamic balance tasks. Still feel pt would benefit from ST SNF placement although noted pt to DC home later today with daughter, recommend HHOT if pt continues to decline SNF, Will continue to follow acutely per POC.   Follow Up Recommendations  SNF;Supervision/Assistance - 24 hour;Home health OT    Equipment Recommendations  3 in 1 bedside commode;Wheelchair (measurements OT);Wheelchair cushion (measurements OT);Other (comment)    Recommendations for Other Services      Precautions / Restrictions Precautions Precautions: Fall Precaution Comments:  Restrictions Weight Bearing Restrictions: No       Mobility Bed Mobility Overal bed mobility: Needs Assistance Bed Mobility: Supine to Sit Rolling: Supervision         General bed mobility comments: pt OOB in recliner  Transfers Overall transfer level: Needs assistance Equipment used: Rolling walker (2 wheeled) Transfers: Sit to/from Stand Sit to Stand: Min guard Stand pivot transfers: Supervision       General transfer comment: min guard for sit<>stand for safety from recliner    Balance  Overall balance assessment: Needs assistance Sitting-balance support: No upper extremity supported;Feet supported Sitting balance-Leahy Scale: Good     Standing balance support: Single extremity supported;During functional activity Standing balance-Leahy Scale: Poor Standing balance comment: at least one UE supproted during ADL tasks                           ADL either performed or assessed with clinical judgement   ADL Overall ADL's : Needs assistance/impaired                 Upper Body Dressing : Minimal assistance;Sitting Upper Body Dressing Details (indicate cue type and reason): to don [place] shirt Lower Body Dressing: Moderate assistance;Sit to/from stand Lower Body Dressing Details (indicate cue type and reason): pt able to pull pants up to waist line but required assist to thread BLEs Toilet Transfer: Min Administrator Details (indicate cue type and reason): simulated via sit<>stand only with RW x2 during dressing       Tub/Shower Transfer Details (indicate cue type and reason): education and handout provided on safe tub shower transfer to 3n1, demo'ed technique for pt and her dtr and additionally provided handout to ensure carryover, encouraged daughter and pt to complete sink baths at first for safety Functional mobility during ADLs: Min guard;Rolling walker (sit<>stand only with RW) General ADL Comments: pt to DC home later today, session focus on BADL reeducation and education related to BADL participation for pt and her daughter     Vision       Perception     Praxis  Cognition Arousal/Alertness: Awake/alert Behavior During Therapy: WFL for tasks assessed/performed;Agitated Overall Cognitive Status: Within Functional Limits for tasks assessed                                 General Comments: HOH.        Exercises     Shoulder Instructions       General Comments VSS, pts daughter present during session,  education provided to daughter on set- up of DME. education provided on safe set- up of 3n1 in tub shower and provided pts daughter with handout to increase carryover. education provided on purchasing pulse ox for home to monitor pts HR and SpO2    Pertinent Vitals/ Pain       Pain Assessment: No/denies pain  Home Living                                          Prior Functioning/Environment              Frequency  Min 2X/week        Progress Toward Goals  OT Goals(current goals can now be found in the care plan section)  Progress towards OT goals: Progressing toward goals  Acute Rehab OT Goals Patient Stated Goal: to go home. OT Goal Formulation: With patient Time For Goal Achievement: 06/05/20 Potential to Achieve Goals: Good  Plan Discharge plan remains appropriate;Frequency remains appropriate    Co-evaluation                 AM-PAC OT "6 Clicks" Daily Activity     Outcome Measure   Help from another person eating meals?: None Help from another person taking care of personal grooming?: A Little Help from another person toileting, which includes using toliet, bedpan, or urinal?: A Little Help from another person bathing (including washing, rinsing, drying)?: A Lot Help from another person to put on and taking off regular upper body clothing?: A Little Help from another person to put on and taking off regular lower body clothing?: A Lot 6 Click Score: 17    End of Session Equipment Utilized During Treatment: Rolling walker  OT Visit Diagnosis: Unsteadiness on feet (R26.81);Other abnormalities of gait and mobility (R26.89);Hemiplegia and hemiparesis;Muscle weakness (generalized) (M62.81);Other symptoms and signs involving cognitive function Hemiplegia - Right/Left: Left Hemiplegia - dominant/non-dominant: Non-Dominant Hemiplegia - caused by: Cerebral infarction   Activity Tolerance Patient tolerated treatment well   Patient Left in  chair;with call bell/phone within reach;with family/visitor present   Nurse Communication Mobility status        Time: 1497-0263 OT Time Calculation (min): 17 min  Charges: OT General Charges $OT Visit: 1 Visit OT Treatments $Self Care/Home Management : 8-22 mins  Audery Amel., COTA/L Acute Rehabilitation Services [phone]    Angelina Pih 06/06/2020, 1:08 PM

## 2020-06-06 NOTE — Discharge Summary (Signed)
Physician Discharge Summary  Beverly Howell ZOX:[id number] DOB: 31-May-1941 DOA: 05/19/2020  PCP: System, Provider Not In  Admit date: 05/19/2020 Discharge date: 06/06/2020  Admitted From: Nursing home Disposition: Home  Recommendations for Outpatient Follow-up:  1. Follow up with PCP in 1-2 weeks 2. Follow-up with pulmonology in 1 month 3. Follow-up with ID in 1 month 4. Follow-up with CT surgery in 1 month 5. Please obtain BMP/CBC in one week 6. Please follow up with your PCP on the following pending results: Unresulted Labs (From admission, onward)         None       Home Health: Yes Equipment/Devices: Rolling walker, commode  Discharge Condition: Stable CODE STATUS: Full code Diet recommendation: Cardiac  Subjective: Seen and examined.  Patient is feeling well.  Cheerful.  Denies any complaint.  She is happy that she is going home today  Brief/Interim Summary: 79 year old lady with prior history of severe COVID-19 infection, lower extremity DVT, CVA presented to the hospital with worsening shortness of breath.  She was initially hospitalized from 11/8-11/15 for left-sided weakness due to CVA COVID-19 pneumonia and bilateral lower extremity DVT.   -Presented to the ED from [facility] on 12/6 with shortness of breath cough and hemoptysis , CT angiogram on 05/20/2020 showed a large loculated right hydropneumothorax and necrotizing pneumonia.  CT surgery consulted.  IR placed 2 chest tubes on 05/20/2020.  ID was also consulted.  Pleural fluid culture were positive for MSSA.  Her antibiotics were changed to Unasyn.  1 chest tube was removed on 06/03/2019 and the other 1 was removed on 06/04/2020.  ID recommended continuing IV antibiotics while inpatient and discharging on Augmentin for 2 more months.  Patient was evaluated by PT OT and they recommended either 24-hour supervision with home health or SNF.  Unfortunately, patient had to pay co-pay to go to SNF which family could not afford  so they decided to take her home with home health and they will provide 24-hour supervision.  Patient is feeling much better since last 2 days, since her chest tube was removed.  She is not hypoxic at all.  She is being discharged in a stable condition.  I have prescribed her 1 month of Augmentin.  She needs to follow-up with ID and pulmonary within a month and I will defer to ID or pulmonology to prescribe another month of Augmentin if they deem it necessary.  Discharge Diagnoses:  Principal Problem:   Necrotizing pneumonia (HCC) Active Problems:   Essential hypertension   History of CVA in adulthood   History of DVT of lower extremity    Discharge Instructions   Allergies as of 06/06/2020   No Known Allergies     Medication List    STOP taking these medications   atenolol 50 MG tablet Commonly known as: TENORMIN   dexamethasone 6 MG tablet Commonly known as: DECADRON   furosemide 10 MG/ML injection Commonly known as: LASIX     TAKE these medications   acetaminophen 500 MG tablet Commonly known as: TYLENOL Take 1,000 mg by mouth in the morning, at noon, and at bedtime.   albuterol 108 (90 Base) MCG/ACT inhaler Commonly known as: VENTOLIN HFA Inhale 2 puffs into the lungs every 8 (eight) hours as needed for wheezing.   amLODipine 5 MG tablet Commonly known as: NORVASC Take 1 tablet (5 mg total) by mouth daily.   amoxicillin-clavulanate 875-125 MG tablet Commonly known as: Augmentin Take 1 tablet by mouth 2 (two) times daily.  apixaban 5 MG Tabs tablet Commonly known as: ELIQUIS Take 1 tablet (5 mg total) by mouth 2 (two) times daily. Start 11/17 evening. What changed: Another medication with the same name was removed. Continue taking this medication, and follow the directions you see here.   atorvastatin 40 MG tablet Commonly known as: LIPITOR Take 1 tablet (40 mg total) by mouth daily.   CALCIUM 600-D PO Take 1 tablet by mouth in the morning and at  bedtime.   cholecalciferol 25 MCG (1000 UNIT) tablet Commonly known as: VITAMIN D Take 1,000 Units by mouth daily.   Fish Oil Triple Strength 1400 MG Caps Take 1,400 mg by mouth daily.   guaiFENesin 600 MG 12 hr tablet Commonly known as: MUCINEX Take 1,200 mg by mouth 2 (two) times daily.   ipratropium-albuterol 0.5-2.5 (3) MG/3ML Soln Commonly known as: DUONEB Take 3 mLs by nebulization 2 (two) times daily as needed.   lidocaine 4 % cream Commonly known as: LMX Apply 1 application topically 2 (two) times daily.   metoprolol tartrate 25 MG tablet Commonly known as: LOPRESSOR Take 2 tablets (50 mg total) by mouth 2 (two) times daily.   pantoprazole 40 MG tablet Commonly known as: PROTONIX Take 1 tablet (40 mg total) by mouth daily.   polyethylene glycol 17 g packet Commonly known as: MIRALAX / GLYCOLAX Take 17 g by mouth every other day.   sennosides-docusate sodium 8.6-50 MG tablet Commonly known as: SENOKOT-S Take 2 tablets by mouth daily.   simethicone 125 MG chewable tablet Commonly known as: MYLICON Chew 125 mg by mouth in the morning and at bedtime.            Durable Medical Equipment  (From admission, onward)         Start     Ordered   06/05/20 1406  For home use only DME Walker rolling  Once       Question Answer Comment  Walker: With 5 Inch Wheels   Patient needs a walker to treat with the following condition Balance problem      06/05/20 1406   06/05/20 1406  For home use only DME Bedside commode  Once       Question:  Patient needs a bedside commode to treat with the following condition  Answer:  Balance problem   06/05/20 1406          Follow-up Information    Delight OvensGerhardt, Edward B, MD. Go on 06/19/2020.   Specialty: Cardiothoracic Surgery Why: PA/LAT CXR to be taken ([facility] which is in the same building as Dr. Dennie MaizesGerhardt's office) on 01/06 at 9:00 am;Appointment time is at 9:30 am Contact information: [address] [place] [address] [phone]        [facility], [facility] Follow up.   Why: [employer] is delivering a rolling walker and 3:1 to your hospital room prior to your discharge home. Contact information: [address] [place] [address] [phone]        [facility] Follow up.   Specialty: Home Health Services Why: [facility] be providing you with home health services including RN, PT, OT, aide.  They will call you in the next 24-48 hours to set up therapy times. Contact information: [address] [place] [address] [phone]        Daiva EvesVan Dam, Lisette Grinderornelius N, MD Follow up in 4 week(s).   Specialty: Infectious Diseases Contact information: [address] [place] [address] [phone]              No Known Allergies  Consultations: ID, CT surgery, IR   Procedures/Studies: DG Chest 1 View  Result Date: 05/24/2020 CLINICAL DATA:  Pneumothorax. EXAM: CHEST  1 VIEW COMPARISON:  May 23, 2020. FINDINGS: Stable cardiomediastinal silhouette. Stable position of 2 right-sided chest tubes. Stable mild pneumothorax is noted laterally. Stable right basilar atelectasis is noted with probable pleural effusion. Pleural thickening is noted. Stable left basilar atelectasis or infiltrates are noted. Bony thorax is unremarkable. IMPRESSION: Stable position of 2 right-sided chest tubes. Stable mild pneumothorax is noted laterally. Stable right basilar atelectasis is noted with probable pleural effusion. Stable left basilar atelectasis or infiltrates are noted. Electronically Signed   By: Lupita Raider M.D.   On: 05/24/2020 08:46   DG Chest 2 View  Result Date: 06/05/2020 CLINICAL DATA:  Chest tube removal EXAM: CHEST - 2 VIEW COMPARISON:  06/04/2020 FINDINGS: Chest tube is been removed from the inferior right chest. There is slight worsening of volume loss at both lung bases. On the right, loculated pleural  air space remains present. This has not apparently increased or developed more mass effect. Pleural density on the right appears similar. IMPRESSION: 1. Chest tube removed from the inferior right chest. Slight worsening of volume loss at both lung bases. 2. No change in loculated right pleural air space and right pleural fluid. Electronically Signed   By: Paulina Fusi M.D.   On: 06/05/2020 08:21   DG Chest 2 View  Result Date: 06/02/2020 CLINICAL DATA:  Follow-up.  Chest tube. EXAM: CHEST - 2 VIEW COMPARISON:  05/31/2020.  05/30/2020. FINDINGS: Right chest tubes in stable position. No definite pneumothorax noted on today's exam. Persistent right pleural thickening/effusion. Persistent bibasilar atelectasis/infiltrates. Heart size stable. Degenerative change thoracic spine and both shoulders. Mild chest wall subcutaneous emphysema. IMPRESSION: Right chest tubes in stable position. No definite pneumothorax noted on today's exam. Persistent right pleural thickening and bibasilar atelectasis/infiltrates. Electronically Signed   By: Maisie Fus  Register   On: 06/02/2020 08:01   CT Angio Chest PE W and/or Wo Contrast  Result Date: 05/20/2020 CLINICAL DATA:  PE suspected. Recent COVID diagnosis with new hypoxemia and hemoptysis. EXAM: CT ANGIOGRAPHY CHEST WITH CONTRAST TECHNIQUE: Multidetector CT imaging of the chest was performed using the standard protocol during bolus administration of intravenous contrast. Multiplanar CT image reconstructions and MIPs were obtained to evaluate the vascular anatomy. CONTRAST:  75 mL of Omnipaque 350 COMPARISON:  None. FINDINGS: Cardiovascular: Contrast injection is sufficient to demonstrate satisfactory opacification of the pulmonary arteries to the segmental level. There is no pulmonary embolus or evidence of right heart strain. The size of the main pulmonary artery is normal. Mild cardiomegaly. The course and caliber of the aorta are normal. There is mild atherosclerotic  calcification. Opacification decreased due to pulmonary arterial phase contrast bolus timing. Mediastinum/Nodes: -- No mediastinal lymphadenopathy. -- No hilar lymphadenopathy. -- No axillary lymphadenopathy. -- No supraclavicular lymphadenopathy. -- Normal thyroid gland where visualized. -  Unremarkable esophagus. Lungs/Pleura: There is a large, loculated, right-sided hydropneumothorax. There is complete collapse of the right lower lobe. There are hypoattenuating areas throughout the collapsed right lower lobe consistent with a necrotizing pneumonia. There are findings suspicious for a bronchopleural fistula arising from the right lower lobe (axial series 8 image 96). There is complete collapse of the right middle lobe. There is near complete collapse of the right upper lobe. Extensive consolidation and atelectasis  is noted throughout the right middle and right upper lobes. There is shift of the mediastinum to the left. Hazy, coarse airspace opacities are noted involving the left lung field without evidence for left-sided pneumothorax or significant pleural effusion. Upper Abdomen: Contrast bolus timing is not optimized for evaluation of the abdominal organs. The visualized portions of the organs of the upper abdomen are normal. Musculoskeletal: No chest wall abnormality. No bony spinal canal stenosis. Review of the MIP images confirms the above findings. IMPRESSION: 1. No acute pulmonary embolism. 2. Large right-sided, loculated, hydropneumothorax. 3. 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 4. Complete collapse of the right middle lobe and partial collapse of the right upper lobe. There are nonenhancing components within the right middle lobe and right upper lobe concerning for multifocal pneumonia. 5. Scattered coarse airspace opacities are noted throughout the left lung field consistent with the  patient's history of recent COVID-19 pneumonia. Aortic Atherosclerosis (ICD10-I70.0). Electronically Signed   By: Katherine Mantle M.D.   On: 05/20/2020 00:22   DG CHEST PORT 1 VIEW  Result Date: 06/04/2020 CLINICAL DATA:  Pneumothorax EXAM: PORTABLE CHEST 1 VIEW COMPARISON:  06/03/2020 FINDINGS: Pigtail chest tube at the right lung base. Relative lucency of the right hemithorax as seen previously may reflect residual loculated cavity previously identified on chest CT. Right lung aeration is unchanged. Unchanged right pleural effusion or pleural thickening. Stable aeration of the left lung with probable atelectasis at the bases. Similar right chest wall emphysema. IMPRESSION: Right chest tube in place. Relative lucency of the right hemithorax as seen on prior study may reflect residual loculated cavity previously identified on chest CT. Lung aeration is unchanged. Electronically Signed   By: Guadlupe Spanish M.D.   On: 06/04/2020 08:11   DG Chest Port 1 View  Result Date: 06/03/2020 CLINICAL DATA:  Chest tube EXAM: PORTABLE CHEST 1 VIEW COMPARISON:  Yesterday FINDINGS: One of 2 small bore chest tubes remain in similar position near the diaphragm. 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 Relative lucency over the right lung which correlates with a loculated cavity by most recent chest CT. The adjacent compressed/consolidated lung is likely stable. Low volume left chest with mild streaky density at the bases, stable. Normal heart size. IMPRESSION: Interval removal of 1 of the right chest tubes with new chest wall emphysema but stable appearing cavity. Electronically Signed   By: Marnee Spring M.D.   On: 06/03/2020 06:17   DG CHEST PORT 1 VIEW  Result Date: 05/31/2020 CLINICAL DATA:  Right chest tube.  Pneumothorax. EXAM: PORTABLE CHEST 1 VIEW COMPARISON:  May 30, 2020 FINDINGS: The right chest tube is stable. The small right hydropneumothorax near the base is stable. The cardiomediastinal  silhouette and lungs are otherwise unchanged. Opacity in the right base is unchanged. IMPRESSION: 1. Stable right chest tube and stable small right hydropneumothorax. Persistent opacity in the right base. No other changes. Electronically Signed   By: Gerome Sam III M.D   On: 05/31/2020 10:00   DG CHEST PORT 1 VIEW  Result Date: 05/30/2020 CLINICAL DATA:  Follow-up chest tubes EXAM: PORTABLE CHEST 1 VIEW COMPARISON:  05/29/2020 FINDINGS: Two pigtail catheters are again identified on the right and stable. Persistent pleural collection is seen as well as a basal lateral pneumothorax. Stable opacity is noted related to the collapsed right lower lobe. Left basilar atelectasis is noted. No new focal abnormality is seen. IMPRESSION:  Stable appearance on the right chest with persistent pleural collection and basilar pneumothorax. Chest tubes remain in place. Electronically Signed   By: Alcide Clever M.D.   On: 05/30/2020 08:11   DG Chest Port 1 View  Result Date: 05/29/2020 CLINICAL DATA:  Pneumothorax with chest tubes in place EXAM: PORTABLE CHEST 1 VIEW COMPARISON:  May 28, 2020 FINDINGS: Chest tubes are unchanged in position on the right. There is a pneumothorax in the inferolateral right base region, slightly larger compared to 1 day prior without tension component. There is subcutaneous air on the right. There is a small right pleural effusion with right base atelectatic change. There is an area of ill-defined opacity in the left base, similar to 1 day prior. Heart size and pulmonary vascularity are normal. No adenopathy. Extensive arthropathy in each shoulder noted. IMPRESSION: Chest tubes unchanged on the right with slightly larger lateral right basilar pneumothorax. No tension component. There is a small right pleural effusion with atelectatic change in the right base. There is subcutaneous air on the right. Question atelectasis versus focal area of infiltrate left base. Left lung otherwise clear.  Heart size within normal limits. Electronically Signed   By: Bretta Bang III M.D.   On: 05/29/2020 08:22   DG CHEST PORT 1 VIEW  Result Date: 05/28/2020 CLINICAL DATA:  79 year old female with recent COVID-19. Diagnosis. Right chest loculated hydropneumothorax treated with chest tube. EXAM: PORTABLE CHEST 1 VIEW COMPARISON:  Portable chest 05/27/2020 and earlier. FINDINGS: Portable AP semi upright view at 0531 hours. Stable two right chest pigtail tubes. Lateral pleural thickening redemonstrated. Decreased component of pleural gas since yesterday. Slightly increased right chest wall subcutaneous gas. Stable lung volumes. Patchy and confluent right lung base opacity stable since 05/26/2020. Stable patchy opacity in the left mid and lower lung. Stable cardiac size and mediastinal contours. Visualized tracheal air column is within normal limits. Negative visible bowel gas pattern. Stable visualized osseous structures. IMPRESSION: 1. Stable right chest tubes. Decreased right pleural gas, trace residual. Underlying right pleural thickening. 2. Otherwise stable ventilation with bilateral COVID-19 pneumonia. Electronically Signed   By: Odessa Fleming M.D.   On: 05/28/2020 07:39   DG CHEST PORT 1 VIEW  Result Date: 05/27/2020 CLINICAL DATA:  Pleural effusion.  Pneumothorax.  Chest tube. EXAM: PORTABLE CHEST 1 VIEW COMPARISON:  05/26/2020. FINDINGS: Two right chest tubes in stable position. Stable small right pneumothorax. Stable right-sided pleural thickening. Persistent bibasilar atelectasis/infiltrates. Heart size stable. Severe degenerative changes cervicothoracic spine and both shoulders. Mild right chest wall subcutaneous emphysema again noted. IMPRESSION: 1. Two right chest tubes in stable position. Stable small right pneumothorax. Stable right-sided pleural thickening. 2. Persistent bibasilar atelectasis/infiltrates. Electronically Signed   By: Maisie Fus  Register   On: 05/27/2020 05:45   DG Chest Port 1  View  Result Date: 05/26/2020 CLINICAL DATA:  Chest tube. EXAM: PORTABLE CHEST 1 VIEW COMPARISON:  05/24/2020 FINDINGS: The cardiomediastinal silhouette is unchanged. 2 right-sided chest tubes remain in place. There has been interval retraction of the more inferiorly positioned chest tube which now terminates over the mid to lateral aspect of the right lung base. A small right-sided pneumothorax is again noted laterally and has not significantly changed in size. Airspace opacity in the right mid and lower lung and a small right pleural effusion are unchanged, as are mild left basilar airspace opacities. A small amount of subcutaneous emphysema in the right lateral chest wall/axilla has decreased. IMPRESSION: 1. Interval retraction of the more inferiorly positioned right chest  tube. Unchanged small right pneumothorax. 2. Unchanged right greater than left basilar airspace disease and small right pleural effusion. Electronically Signed   By: Sebastian Ache M.D.   On: 05/26/2020 07:42   DG Chest Port 1 View  Result Date: 05/23/2020 CLINICAL DATA:  Pleural effusion. EXAM: PORTABLE CHEST 1 VIEW COMPARISON:  05/22/2020. FINDINGS: Two right chest tubes in stable position. Stable right base pneumothorax and small right pleural effusion. Persistent bibasilar atelectasis/infiltrates. Heart size stable. Right chest wall subcutaneous emphysema again noted. Degenerative change both shoulders and thoracic spine. IMPRESSION: 1. Two right chest tubes in stable position. Stable right base pneumothorax and small right pleural effusion. Right chest wall subcutaneous emphysema again noted. 2. Persistent bibasilar atelectasis/infiltrates. Electronically Signed   By: Maisie Fus  Register   On: 05/23/2020 05:55   DG Chest Port 1 View  Result Date: 05/22/2020 CLINICAL DATA:  Chest tube. EXAM: PORTABLE CHEST 1 VIEW COMPARISON:  05/21/2020. FINDINGS: Two right chest tubes in stable position. Stable right pneumothorax and small right  pleural effusion. Persistent bibasilar atelectasis/infiltrates. Heart size normal. Degenerative change thoracic spine. Stable right chest wall subcutaneous emphysema. IMPRESSION: 1. Two right chest tubes in stable position. Stable right pneumothorax and small right pleural effusion. Stable right chest wall subcutaneous emphysema. 2. Persistent bibasilar atelectasis/infiltrates. Electronically Signed   By: Maisie Fus  Register   On: 05/22/2020 06:16   DG Chest Port 1 View  Result Date: 05/21/2020 CLINICAL DATA:  Chest tube in place. EXAM: PORTABLE CHEST 1 VIEW COMPARISON:  May 19, 2020. FINDINGS: Stable cardiomediastinal silhouette. Interval placement of 2 right-sided chest tubes. Right pneumothorax is significantly smaller compared to prior exam. Some degree of pleural effusion is also noted. Mild subcutaneous emphysema is seen over right lateral chest wall. Bilateral lung opacities are noted which may represent multifocal pneumonia. Bony thorax is unremarkable. IMPRESSION: Interval placement of 2 right-sided chest tubes. Right pneumothorax is significantly smaller compared to prior exam. Some degree of pleural effusion is also noted. Bilateral lung opacities are noted which may represent multifocal pneumonia. Electronically Signed   By: Lupita Raider M.D.   On: 05/21/2020 08:41   DG Chest Portable 1 View  Result Date: 05/19/2020 CLINICAL DATA:  Sudden onset short of breath with hemoptysis EXAM: PORTABLE CHEST 1 VIEW COMPARISON:  04/21/2020 FINDINGS: Interval hyper lucent right thorax with meniscus levels at the right base concerning for moderate to large hydropneumothorax. Patchy consolidation and ground-glass density in the left lower lung without significant change. Stable cardiomediastinal silhouette with aortic atherosclerosis. IMPRESSION: 1. Increased hyperlucency of the right thorax with meniscus levels at the right base concerning for moderate to large hydropneumothorax. CT chest recommended for  further evaluation. 2. Stable patchy consolidation and ground-glass density in the left lower lung. Critical Value/emergent results were called by telephone at the time of interpretation on 05/19/2020 at 7:39 pm to provider [facility] , who verbally acknowledged these results. Electronically Signed   By: Jasmine Pang M.D.   On: 05/19/2020 19:39   CT IMAGE GUIDED DRAINAGE BY PERCUTANEOUS CATHETER  Result Date: 05/21/2020 INDICATION: 79 year old female with history of recent COVID pneumonia presenting with large right hydropneumothorax. EXAM: CT IMAGE GUIDED DRAINAGE BY PERCUTANEOUS CATHETER COMPARISON:  Chest CT from 03/03/2020 MEDICATIONS: The patient is currently admitted to the hospital and receiving intravenous antibiotics. The antibiotics were administered within an appropriate time frame prior to the initiation of the procedure. ANESTHESIA/SEDATION: Moderate (conscious) sedation was employed during this procedure. A total of Versed 3 mg and Fentanyl 150 mcg  was administered intravenously. Moderate Sedation Time: 37 minutes. The patient's level of consciousness and vital signs were monitored continuously by radiology nursing throughout the procedure under my direct supervision. CONTRAST:  None COMPLICATIONS: None immediate. PROCEDURE: Informed written consent was obtained from the patient after a discussion of the risks, benefits and alternatives to treatment. The patient was placed supine on the CT gantry and a pre procedural CT was performed re-demonstrating the large right hydropneumothorax. The procedure was planned. A timeout was performed prior to the initiation of the procedure. The anterior, right superior thorax was prepped and draped in the usual sterile fashion. The overlying soft tissues were anesthetized with 1% lidocaine with epinephrine. An 18 gauge trocar needle was advanced into the pleural space and a short Amplatz super stiff wire was coiled within the collection. Appropriate  positioning was confirmed with a limited CT scan. The tract was serially dilated allowing placement of a 14 French all-purpose drainage catheter. Appropriate positioning was confirmed with a limited postprocedural CT scan. A total of approximately 100 ml of thick, purulence and sanguinous fluid was aspirated. Samples were sent to the lab for fluid analysis. The tube was connected to a pleurovac and sutured in place. A dressing was placed. Next, the patient was placed in a partial left lateral decubitus position and an additional limited range CT of the chest was performed demonstrated persistent fluid collection in the basal pleura. Additional thoracostomy tube placement was planned. The right mid axillary line was prepped and draped in the usual sterile fashion. The overlying soft tissues were anesthetized with 1% lidocaine with epinephrine. An 18 gauge trocar needle was advanced into the pleural space and a short Amplatz super stiff wire was coiled within the collection. Appropriate positioning was confirmed with a limited CT scan. The tract was serially dilated allowing placement of a 14 French all-purpose drainage catheter. Appropriate positioning was confirmed with a limited postprocedural CT scan. The patient tolerated the procedure well without immediate post procedural complication. IMPRESSION: Successful CT guided placement of a 32 French all purpose drain catheter into the right lung apex as well as additional 14 [place] all-purpose drain catheter into the posterolateral and inferior right pleural space with aspiration of thick, purulence thin sanguinous fluid. Samples were sent to the laboratory as requested by the ordering clinical team. Marliss Coots, MD [facility] Radiology Electronically Signed   By: Marliss Coots MD   On: 05/21/2020 08:12      Discharge Exam: Vitals:   06/06/20 0400 06/06/20 0800  BP: (!) 150/76 139/78  Pulse:  82  Resp: 20 20   Temp:  (!) 97.4 F (36.3 C)  SpO2:  98%   Vitals:   06/06/20 0000 06/06/20 0256 06/06/20 0400 06/06/20 0800  BP: 135/79 (!) 151/90 (!) 150/76 139/78  Pulse: 88 85  82  Resp: 20 20 20 20   Temp:  98.7 F (37.1 C)  (!) 97.4 F (36.3 C)  TempSrc:  Oral  Oral  SpO2: 96% 97%  98%  Weight:      Height:        General: Pt is alert, awake, not in acute distress Cardiovascular: RRR, S1/S2 +, no rubs, no gallops Respiratory: CTA bilaterally, no wheezing, no rhonchi Abdominal: Soft, NT, ND, bowel sounds + Extremities: no edema, no cyanosis    The results of significant diagnostics from this hospitalization (including imaging, microbiology, ancillary and laboratory) are listed below for reference.     Microbiology: No results found for  this or any previous visit (from the past 240 hour(s)).   Labs: BNP (last 3 results) Recent Labs    04/21/20 1950 04/25/20 0052  BNP 123.6* 103.4*   Basic Metabolic Panel: Recent Labs  Lab 06/02/20 0126 06/03/20 0114 06/04/20 1327 06/05/20 1125 06/06/20 0215  NA 142 142 140 142 143  K 2.6* 3.8 3.6 3.5 3.1*  CL 106 108 105 106 108  CO2 GLUCOSE 83 87 102* 111* 82  BUN <5* <5* <5* <5* <5*  CREATININE 0.70 0.76 0.83 0.87 0.78  CALCIUM 7.7* 8.0* 8.4* 8.4* 8.2*  MG  --   --  1.5* 1.4* 1.4*   Liver Function Tests: No results for input(s): AST, ALT, ALKPHOS, BILITOT, PROT, ALBUMIN in the last 168 hours. No results for input(s): LIPASE, AMYLASE in the last 168 hours. No results for input(s): AMMONIA in the last 168 hours. CBC: Recent Labs  Lab 06/02/20 0126 06/03/20 0114 06/04/20 1327 06/05/20 1125 06/06/20 0215  WBC 7.3 8.2 10.0 9.2 8.3  NEUTROABS  --   --  6.9 6.9 4.7  HGB 9.6* 10.0* 10.6* 10.7* 10.2*  HCT 31.4* 32.9* 35.8* 34.8* 31.9*  MCV 98.7 98.2 101.1* 99.7 96.1  PLT 482* 447* 481* 488* 480*   Cardiac Enzymes: No results for input(s): CKTOTAL, CKMB, CKMBINDEX, TROPONINI in the last 168  hours. BNP: Invalid input(s): POCBNP CBG: No results for input(s): GLUCAP in the last 168 hours. D-Dimer No results for input(s): DDIMER in the last 72 hours. Hgb A1c No results for input(s): HGBA1C in the last 72 hours. Lipid Profile No results for input(s): CHOL, HDL, LDLCALC, TRIG, CHOLHDL, LDLDIRECT in the last 72 hours. Thyroid function studies No results for input(s): TSH, T4TOTAL, T3FREE, THYROIDAB in the last 72 hours.  Invalid input(s): FREET3 Anemia work up No results for input(s): VITAMINB12, FOLATE, FERRITIN, TIBC, IRON, RETICCTPCT in the last 72 hours. Urinalysis    Component Value Date/Time   COLORURINE AMBER (A) 04/22/2020 1205   APPEARANCEUR HAZY (A) 04/22/2020 1205   LABSPEC 1.026 04/22/2020 1205   PHURINE 5.0 04/22/2020 1205   GLUCOSEU NEGATIVE 04/22/2020 1205   HGBUR NEGATIVE 04/22/2020 1205   BILIRUBINUR NEGATIVE 04/22/2020 1205   KETONESUR 5 (A) 04/22/2020 1205   PROTEINUR 30 (A) 04/22/2020 1205   UROBILINOGEN 0.2 02/03/2014 2029   NITRITE NEGATIVE 04/22/2020 1205   LEUKOCYTESUR NEGATIVE 04/22/2020 1205   Sepsis Labs Invalid input(s): PROCALCITONIN,  WBC,  LACTICIDVEN Microbiology No results found for this or any previous visit (from the past 240 hour(s)).   Time coordinating discharge: Over 30 minutes  SIGNED:   Hughie Closs, MD  [facility] 06/06/2020, 9:09 AM  If 7PM-7AM, please contact night-coverage [email or web address]

## 2020-06-06 NOTE — Care Management Important Message (Signed)
Important Message  Patient Details  Name: Beverly Howell MRN: [id number] Date of Birth: 10-Dec-1940   Medicare Important Message Given:  Yes  Left at the desk for the nurse .     [name] 06/06/2020, 10:00 AM

## 2020-06-18 ENCOUNTER — Other Ambulatory Visit: Payer: Self-pay | Admitting: Cardiothoracic Surgery

## 2020-06-18 DIAGNOSIS — J90 Pleural effusion, not elsewhere classified: Secondary | ICD-10-CM

## 2020-06-19 ENCOUNTER — Other Ambulatory Visit: Payer: Self-pay

## 2020-06-19 ENCOUNTER — Ambulatory Visit: Payer: Medicare (Managed Care) | Admitting: Cardiothoracic Surgery

## 2020-06-19 ENCOUNTER — Ambulatory Visit
Admission: RE | Admit: 2020-06-19 | Discharge: 2020-06-19 | Disposition: A | Discharge status: Home / Self Care | Payer: Medicare (Managed Care) | Source: Ambulatory Visit | Attending: Cardiothoracic Surgery | Admitting: Cardiothoracic Surgery

## 2020-06-19 VITALS — BP 132/83 | HR 68 | Resp 20 | Ht 63.0 in | Wt 175.0 lb

## 2020-06-19 DIAGNOSIS — J850 Gangrene and necrosis of lung: Secondary | ICD-10-CM | POA: Diagnosis not present

## 2020-06-19 DIAGNOSIS — Z8673 Personal history of transient ischemic attack (TIA), and cerebral infarction without residual deficits: Secondary | ICD-10-CM | POA: Diagnosis not present

## 2020-06-19 DIAGNOSIS — J90 Pleural effusion, not elsewhere classified: Secondary | ICD-10-CM

## 2020-06-19 NOTE — Progress Notes (Signed)
[address]       [place] [address]             [phone]      Adysson Schreiner [place] Medical Record #[id number] Date of Birth: Oct 18, 1940  Referring: No ref. provider found Primary Care: Windell Hummingbird, PA-C Primary Cardiologist: No primary care provider on file.   Chief Complaint:   POST OP FOLLOW UP Patient readmitted  after COVID infection, stroke, -discharged home return with right hydropneumothorax-chest tube placed by radiology and admitted to hospitalist service  History of Present Illness:     Patient comes to the office today in follow-up after placement of 2 pigtail catheters into the right pleural space by radiology on her last hospital admission.  She had been admitted in November to [facility] with respiratory distress and COVID infection, while there she also suffered a stroke and was started on Eliquis.  She was discharged to a skilled nursing facility-however no follow-up was arranged for her at the time of that discharge.   In early December she became increasingly short of breath and had some hemoptysis and was brought to the [facility] emergency room with a large right hydropneumothorax.  Radiology under CT guidance placed a superior and inferior pigtail catheter.  It was not felt that she was a candidate for any type of surgical procedure-the chest was treated with drainage and ultimately a talc pleurodesis.   Her daughter brings her to the office today-her first follow-up visit from her 2: Hospital admissions, for COVID stroke and hydropneumothorax.  Her daughter notes that she is doing relatively well at home ambulating around the house, on my assessment she looks to much better than she ever look while she was in the hospital.  Patient's daughter is noted no fever or chills.  She notes that the only medication she was given on discharge was amoxicillin.  Although Eliquis is listed on her current medication list daughter notes that she has not  been taking this.      Past Medical History:  Diagnosis Date  . High cholesterol   . Hypertension      Social History   Tobacco Use  Smoking Status Former Smoker  Smokeless Tobacco Never Used    Social History   Substance and Sexual Activity  Alcohol Use No     No Known Allergies  Current Outpatient Medications  Medication Sig Dispense Refill  . albuterol (VENTOLIN HFA) 108 (90 Base) MCG/ACT inhaler Inhale 2 puffs into the lungs every 8 (eight) hours as needed for wheezing.     Marland Kitchen amoxicillin-clavulanate (AUGMENTIN) 875-125 MG tablet Take 1 tablet by mouth 2 (two) times daily. 60 tablet 0  . Calcium Carb-Cholecalciferol (CALCIUM 600-D PO) Take 1 tablet by mouth in the morning and at bedtime.     . cholecalciferol (VITAMIN D) 25 MCG (1000 UNIT) tablet Take 1,000 Units by mouth daily.     Marland Kitchen diltiazem (DILACOR XR) 240 MG 24 hr capsule Take 240 mg by mouth daily.    Marland Kitchen ipratropium-albuterol (DUONEB) 0.5-2.5 (3) MG/3ML SOLN Take 3 mLs by nebulization 2 (two) times daily as needed.    Marland Kitchen lisinopril (ZESTRIL) 10 MG tablet Take 10 mg by mouth daily.    . metoprolol tartrate (LOPRESSOR) 25 MG tablet Take 2 tablets (50 mg total) by mouth 2 (two) times daily.    . Omega-3 Fatty Acids (FISH OIL TRIPLE STRENGTH) 1400 MG CAPS Take 1,400 mg by mouth daily.    Marland Kitchen  acetaminophen (TYLENOL) 500 MG tablet Take 1,000 mg by mouth in the morning, at noon, and at bedtime. (Patient not taking: Reported on 06/19/2020)    . amLODipine (NORVASC) 5 MG tablet Take 1 tablet (5 mg total) by mouth daily. (Patient not taking: Reported on 06/19/2020)    . apixaban (ELIQUIS) 5 MG TABS tablet Take 1 tablet (5 mg total) by mouth 2 (two) times daily. Start 11/17 evening. (Patient not taking: Reported on 06/19/2020) 60 tablet   . atorvastatin (LIPITOR) 40 MG tablet Take 1 tablet (40 mg total) by mouth daily. (Patient not taking: Reported on 06/19/2020)    . guaiFENesin (MUCINEX) 600 MG 12 hr tablet Take 1,200 mg by mouth 2  (two) times daily. (Patient not taking: Reported on 06/19/2020)    . lidocaine (LMX) 4 % cream Apply 1 application topically 2 (two) times daily. (Patient not taking: Reported on 06/19/2020)    . pantoprazole (PROTONIX) 40 MG tablet Take 1 tablet (40 mg total) by mouth daily. (Patient not taking: Reported on 06/19/2020)    . polyethylene glycol (MIRALAX / GLYCOLAX) 17 g packet Take 17 g by mouth every other day. (Patient not taking: Reported on 06/19/2020)    . sennosides-docusate sodium (SENOKOT-S) 8.6-50 MG tablet Take 2 tablets by mouth daily. (Patient not taking: Reported on 06/19/2020)    . simethicone (MYLICON) 125 MG chewable tablet Chew 125 mg by mouth in the morning and at bedtime. (Patient not taking: Reported on 06/19/2020)     No current facility-administered medications for this visit.       Physical Exam: BP 132/83   Pulse 68   Resp 20   Ht 5\' 3"  (1.6 m)   Wt 175 lb (79.4 kg)   SpO2 97% Comment: RA  BMI 31.00 kg/m   General appearance: alert, cooperative, appears older than stated age and no distress Neurologic: intact Heart: regular rate and rhythm, S1, S2 normal, no murmur, click, rub or gallop Lungs: diminished breath sounds RLL Abdomen: soft, non-tender; bowel sounds normal; no masses,  no organomegaly Extremities: extremities normal, atraumatic, no cyanosis or edema and Homans sign is negative, no sign of DVT Wound: Chest tube sites are healed   Diagnostic Studies & Laboratory data:     Recent Radiology Findings:   DG Chest 2 View  Result Date: 06/19/2020 CLINICAL DATA:  Follow-up pleural effusion EXAM: CHEST - 2 VIEW COMPARISON:  06/06/2020 FINDINGS: Cardiac shadow is stable. Left lung remains well aerated with some minimal basilar scarring. Large air collection in the pleural space is again identified with compressed lung superiorly. Increase in the degree of right-sided pleural fluid is noted when compared with the prior exam. No bony abnormality is noted. IMPRESSION:  Left basilar scarring. Increase in fluid within the loculated air fluid collection on the right. The overall size of the collection appears stable however. Electronically Signed   By: 06/08/2020 M.D.   On: 06/19/2020 08:25    I have independently reviewed the above radiology studies  and reviewed the findings with the patient.    Recent Lab Findings: Lab Results  Component Value Date   WBC 8.3 06/06/2020   HGB 10.2 (L) 06/06/2020   HCT 31.9 (L) 06/06/2020   PLT 480 (H) 06/06/2020   GLUCOSE 82 06/06/2020   CHOL 158 04/22/2020   TRIG 135 04/22/2020   HDL 38 (L) 04/22/2020   LDLCALC 93 04/22/2020   ALT 11 05/27/2020   AST 29 05/27/2020   NA 143 06/06/2020  K 3.1 (L) 06/06/2020   CL 108 06/06/2020   CREATININE 0.78 06/06/2020   BUN <5 (L) 06/06/2020   CO2 22 06/06/2020   INR 1.5 (H) 04/21/2020   HGBA1C 5.5 04/22/2020      Assessment / Plan:   #1  Patient's status post COVID infection, complicated by stroke while hospitalized at [facility] in November #2 right spontaneous hydropneumothorax on the right-treated with pigtail catheter drainage and ultimately talc pleurodesis #3 severe physical deconditioning  The patient appears clinically stable at this point, improved from when she was in the hospital, chest x-ray is reviewed she still has not air-fluid level in the right chest, does not appear to be infected.  At this point I would not recommend any further intervention from a thoracic surgery point of view  I have helped the daughter arrange the follow-up from her recent discharge, she will call and make an appointment with neurology and her primary care doctor, she has appointment with infectious disease next week.  We will plan to see her back in 4 weeks with a follow-up chest x-ray  Medication Changes: No orders of the defined types were placed in this encounter.     [name] MD      [address] [place] [place] [address] Office  [phone]     06/19/2020 10:39 AM

## 2020-06-25 ENCOUNTER — Other Ambulatory Visit: Payer: Self-pay | Admitting: Infectious Disease

## 2020-06-25 ENCOUNTER — Telehealth: Payer: Self-pay | Admitting: Adult Health

## 2020-06-25 ENCOUNTER — Ambulatory Visit (INDEPENDENT_AMBULATORY_CARE_PROVIDER_SITE_OTHER): Payer: Medicare (Managed Care) | Admitting: Infectious Disease

## 2020-06-25 ENCOUNTER — Other Ambulatory Visit: Payer: Self-pay

## 2020-06-25 ENCOUNTER — Encounter: Payer: Self-pay | Admitting: Infectious Disease

## 2020-06-25 VITALS — BP 123/78 | HR 74 | Temp 97.7°F

## 2020-06-25 DIAGNOSIS — Z86718 Personal history of other venous thrombosis and embolism: Secondary | ICD-10-CM | POA: Diagnosis not present

## 2020-06-25 DIAGNOSIS — I639 Cerebral infarction, unspecified: Secondary | ICD-10-CM | POA: Diagnosis not present

## 2020-06-25 DIAGNOSIS — J850 Gangrene and necrosis of lung: Secondary | ICD-10-CM

## 2020-06-25 DIAGNOSIS — Z8673 Personal history of transient ischemic attack (TIA), and cerebral infarction without residual deficits: Secondary | ICD-10-CM | POA: Diagnosis not present

## 2020-06-25 MED ORDER — APIXABAN 5 MG PO TABS: 5.0000 mg | ORAL_TABLET | Freq: Two times a day (BID) | ORAL | 1 refills | Status: DC

## 2020-06-25 MED ORDER — AMOXICILLIN-POT CLAVULANATE 875-125 MG PO TABS: 1.0000 | ORAL_TABLET | Freq: Two times a day (BID) | ORAL | 2 refills | Status: AC

## 2020-06-25 NOTE — Telephone Encounter (Signed)
Noted  

## 2020-06-25 NOTE — Telephone Encounter (Signed)
Called daughter and could not LVM not set up yet.

## 2020-06-25 NOTE — Telephone Encounter (Signed)
Pt.'s Daughter [name] called to see if mom is still supposed to be on blood thinners & if so can they be sent to pharmacy. She states mom was on them in the hospital.  Pharmacy: [facility] [phone]

## 2020-06-25 NOTE — Telephone Encounter (Signed)
Pt.'s daughter called back & was read message of NP that refills can be obtained by PCP.

## 2020-06-25 NOTE — Progress Notes (Signed)
Subjective:    Patient ID: Beverly Howell, female    DOB: 10/19/1940, 80 y.o.   MRN: [id number]  HPI  80 y.o. female history of hypertension obesity who is unvaccinated against COVID-19 and admitted in November with a stroke bilateral DVTs and severe COVID pneumonia.  She ultimately recovered and was discharged from the hospital.  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  She is undergone T guided chest tube placement x2 by interventional radiology with purulent material obtained which is now grown methicillin sensitive Staph aureus  We treated her with unasyn in the hospital and DC on augmentin which she has been taking since.   She has seen Dr. Tyrone Sage who feels effusion is stable. RIght side is more prominent on most recent CXR  She is not certain if she is supposed to be on Eliquis  Or not. WHile she has followup with Neurology she should know if she is to be on it or not so I have asked them to reach out to [facility] and I will message Dr. Pearlean Brownie as well.  Past Medical History:  Diagnosis Date  . High cholesterol   . Hypertension     Past Surgical History:  Procedure Laterality Date  . HAND SURGERY      Family History  Problem Relation Age of Onset  . Stroke Mother       Social History   Socioeconomic History  . Marital status: Single    Spouse name: Not on file  . Number of children: Not on file  . Years of education: Not on file  . Highest education level: Not on file  Occupational History  . Not on file  Tobacco Use  . Smoking status: Former Games developer  . Smokeless tobacco: Never Used  Substance and Sexual Activity  . Alcohol use: No  . Drug use: No  . Sexual activity: Not on file  Other Topics Concern  . Not on file  Social History Narrative  . Not on file   Social  Determinants of Health   Financial Resource Strain: Not on file  Food Insecurity: Not on file  Transportation Needs: Not on file  Physical Activity: Not on file  Stress: Not on file  Social Connections: Not on file    No Known Allergies   Current Outpatient Medications:  .  acetaminophen (TYLENOL) 500 MG tablet, Take 1,000 mg by mouth in the morning, at noon, and at bedtime., Disp: , Rfl:  .  albuterol (VENTOLIN HFA) 108 (90 Base) MCG/ACT inhaler, Inhale 2 puffs into the lungs every 8 (eight) hours as needed for wheezing. , Disp: , Rfl:  .  amLODipine (NORVASC) 5 MG tablet, Take 1 tablet (5 mg total) by mouth daily., Disp: , Rfl:  .  amoxicillin-clavulanate (AUGMENTIN) 875-125 MG tablet, Take 1 tablet by mouth 2 (two) times daily., Disp: 60 tablet, Rfl: 0 .  Calcium Carb-Cholecalciferol (CALCIUM 600-D PO), Take 1 tablet by mouth in the morning and at bedtime. , Disp: , Rfl:  .  cholecalciferol (VITAMIN D) 25 MCG (1000 UNIT) tablet, Take 1,000 Units by mouth daily. , Disp: , Rfl:  .  diltiazem (DILACOR XR) 240 MG 24 hr capsule, Take 240 mg by mouth daily., Disp: , Rfl:  .  lisinopril (ZESTRIL) 10 MG tablet, Take 10 mg by mouth daily., Disp: ,  Rfl:  .  metoprolol tartrate (LOPRESSOR) 25 MG tablet, Take 2 tablets (50 mg total) by mouth 2 (two) times daily., Disp: , Rfl:  .  Omega-3 Fatty Acids (FISH OIL TRIPLE STRENGTH) 1400 MG CAPS, Take 1,400 mg by mouth daily., Disp: , Rfl:  .  apixaban (ELIQUIS) 5 MG TABS tablet, Take 1 tablet (5 mg total) by mouth 2 (two) times daily. Start 11/17 evening. (Patient not taking: No sig reported), Disp: 60 tablet, Rfl:  .  atorvastatin (LIPITOR) 40 MG tablet, Take 1 tablet (40 mg total) by mouth daily. (Patient not taking: No sig reported), Disp: , Rfl:  .  guaiFENesin (MUCINEX) 600 MG 12 hr tablet, Take 1,200 mg by mouth 2 (two) times daily. (Patient not taking: No sig reported), Disp: , Rfl:  .  ipratropium-albuterol (DUONEB) 0.5-2.5 (3) MG/3ML SOLN, Take  3 mLs by nebulization 2 (two) times daily as needed. (Patient not taking: Reported on 06/25/2020), Disp: , Rfl:  .  lidocaine (LMX) 4 % cream, Apply 1 application topically 2 (two) times daily. (Patient not taking: No sig reported), Disp: , Rfl:  .  pantoprazole (PROTONIX) 40 MG tablet, Take 1 tablet (40 mg total) by mouth daily. (Patient not taking: No sig reported), Disp: , Rfl:  .  polyethylene glycol (MIRALAX / GLYCOLAX) 17 g packet, Take 17 g by mouth every other day. (Patient not taking: No sig reported), Disp: , Rfl:  .  sennosides-docusate sodium (SENOKOT-S) 8.6-50 MG tablet, Take 2 tablets by mouth daily. (Patient not taking: No sig reported), Disp: , Rfl:  .  simethicone (MYLICON) 125 MG chewable tablet, Chew 125 mg by mouth in the morning and at bedtime. (Patient not taking: No sig reported), Disp: , Rfl:     Review of Systems  Constitutional: Negative for activity change, appetite change, chills, diaphoresis, fatigue, fever and unexpected weight change.  HENT: Negative for congestion, rhinorrhea, sinus pressure, sneezing, sore throat and trouble swallowing.   Eyes: Negative for photophobia and visual disturbance.  Respiratory: Positive for shortness of breath. Negative for cough, chest tightness, wheezing and stridor.   Cardiovascular: Negative for chest pain, palpitations and leg swelling.  Gastrointestinal: Negative for abdominal distention, abdominal pain, anal bleeding, blood in stool, constipation, diarrhea, nausea and vomiting.  Genitourinary: Negative for difficulty urinating, dysuria, flank pain and hematuria.  Musculoskeletal: Negative for arthralgias, back pain, gait problem, joint swelling and myalgias.  Skin: Negative for color change, pallor, rash and wound.  Neurological: Negative for dizziness, tremors, weakness and light-headedness.  Hematological: Negative for adenopathy. Does not bruise/bleed easily.  Psychiatric/Behavioral: Negative for agitation, behavioral  problems, confusion, decreased concentration, dysphoric mood and sleep disturbance.       Objective:   Physical Exam Constitutional:      General: She is not in acute distress.    Appearance: Normal appearance. She is well-developed and well-nourished. She is not ill-appearing or diaphoretic.  HENT:     Head: Normocephalic and atraumatic.     Right Ear: Hearing and external ear normal.     Left Ear: Hearing and external ear normal.     Nose: No nasal deformity, rhinorrhea or epistaxis.  Eyes:     General: No scleral icterus.    Extraocular Movements: EOM normal.     Conjunctiva/sclera: Conjunctivae normal.     Right eye: Right conjunctiva is not injected.     Left eye: Left conjunctiva is not injected.     Pupils: Pupils are equal, round, and reactive to light.  Neck:  Vascular: No JVD.  Cardiovascular:     Rate and Rhythm: Normal rate and regular rhythm.     Heart sounds: Normal heart sounds, S1 normal and S2 normal. No murmur heard. No friction rub.  Pulmonary:     Effort: No respiratory distress.     Breath sounds: Examination of the right-lower field reveals decreased breath sounds. Examination of the left-lower field reveals decreased breath sounds. Decreased breath sounds present. No wheezing.  Abdominal:     General: Bowel sounds are normal. There is no distension or ascites.     Palpations: Abdomen is soft. There is no hepatosplenomegaly.     Tenderness: There is no abdominal tenderness.  Musculoskeletal:        General: Normal range of motion.     Right shoulder: Normal.     Left shoulder: Normal.     Cervical back: Normal range of motion and neck supple.     Right hip: Normal.     Left hip: Normal.     Right knee: Normal.     Left knee: Normal.  Lymphadenopathy:     Head:     Right side of head: No submandibular, preauricular or posterior auricular adenopathy.     Left side of head: No submandibular, preauricular or posterior auricular adenopathy.      Cervical: No cervical adenopathy.     Right cervical: No superficial or deep cervical adenopathy.    Left cervical: No superficial or deep cervical adenopathy.  Skin:    General: Skin is warm, dry and intact.     Coloration: Skin is not pale.     Findings: No abrasion, bruising, ecchymosis, erythema, lesion or rash.     Nails: There is no clubbing or cyanosis.  Neurological:     Mental Status: She is alert and oriented to person, place, and time.     Sensory: No sensory deficit.     Coordination: Coordination normal.     Gait: Gait normal.     Deep Tendon Reflexes: Strength normal.  Psychiatric:        Attention and Perception: She is attentive.        Mood and Affect: Mood and affect normal.        Speech: Speech normal.        Behavior: Behavior normal. Behavior is cooperative.        Thought Content: Thought content normal.        Cognition and Memory: Cognition and memory normal.        Judgment: Judgment normal.           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

## 2020-06-25 NOTE — Telephone Encounter (Signed)
Refills can be obtained by PCP -Per review of chart, placed on Eliquis for evidence of DVT which is typically managed by PCP

## 2020-07-17 ENCOUNTER — Ambulatory Visit: Payer: Medicare (Managed Care) | Admitting: Cardiothoracic Surgery

## 2020-07-22 ENCOUNTER — Inpatient Hospital Stay: Payer: Medicare (Managed Care) | Admitting: Adult Health

## 2020-08-05 ENCOUNTER — Inpatient Hospital Stay: Payer: Medicare (Managed Care) | Admitting: Adult Health

## 2020-08-13 ENCOUNTER — Other Ambulatory Visit: Payer: Self-pay | Admitting: Cardiothoracic Surgery

## 2020-08-13 DIAGNOSIS — J948 Other specified pleural conditions: Secondary | ICD-10-CM

## 2020-08-14 ENCOUNTER — Other Ambulatory Visit: Payer: Self-pay

## 2020-08-14 ENCOUNTER — Inpatient Hospital Stay (HOSPITAL_COMMUNITY)
Admission: EM | Admit: 2020-08-14 | Discharge: 2020-08-22 | DRG: 640 | Disposition: A | Discharge status: Home Health | Payer: Medicare (Managed Care) | Attending: Internal Medicine | Admitting: Internal Medicine

## 2020-08-14 ENCOUNTER — Encounter (HOSPITAL_COMMUNITY): Payer: Self-pay | Admitting: Emergency Medicine

## 2020-08-14 ENCOUNTER — Inpatient Hospital Stay: Payer: Medicare (Managed Care) | Admitting: Adult Health

## 2020-08-14 ENCOUNTER — Emergency Department (HOSPITAL_COMMUNITY): Payer: Medicare (Managed Care)

## 2020-08-14 ENCOUNTER — Ambulatory Visit: Payer: Medicare (Managed Care) | Admitting: Cardiothoracic Surgery

## 2020-08-14 DIAGNOSIS — R531 Weakness: Secondary | ICD-10-CM

## 2020-08-14 DIAGNOSIS — I471 Supraventricular tachycardia: Secondary | ICD-10-CM | POA: Diagnosis present

## 2020-08-14 DIAGNOSIS — Z79899 Other long term (current) drug therapy: Secondary | ICD-10-CM

## 2020-08-14 DIAGNOSIS — E869 Volume depletion, unspecified: Secondary | ICD-10-CM | POA: Diagnosis present

## 2020-08-14 DIAGNOSIS — E46 Unspecified protein-calorie malnutrition: Secondary | ICD-10-CM | POA: Diagnosis present

## 2020-08-14 DIAGNOSIS — I951 Orthostatic hypotension: Secondary | ICD-10-CM | POA: Diagnosis not present

## 2020-08-14 DIAGNOSIS — J850 Gangrene and necrosis of lung: Secondary | ICD-10-CM | POA: Diagnosis present

## 2020-08-14 DIAGNOSIS — Z8249 Family history of ischemic heart disease and other diseases of the circulatory system: Secondary | ICD-10-CM

## 2020-08-14 DIAGNOSIS — Z86718 Personal history of other venous thrombosis and embolism: Secondary | ICD-10-CM

## 2020-08-14 DIAGNOSIS — E8809 Other disorders of plasma-protein metabolism, not elsewhere classified: Secondary | ICD-10-CM | POA: Diagnosis present

## 2020-08-14 DIAGNOSIS — Z20822 Contact with and (suspected) exposure to covid-19: Secondary | ICD-10-CM | POA: Diagnosis present

## 2020-08-14 DIAGNOSIS — I639 Cerebral infarction, unspecified: Secondary | ICD-10-CM

## 2020-08-14 DIAGNOSIS — Z87891 Personal history of nicotine dependence: Secondary | ICD-10-CM

## 2020-08-14 DIAGNOSIS — K222 Esophageal obstruction: Secondary | ICD-10-CM | POA: Diagnosis present

## 2020-08-14 DIAGNOSIS — Z7901 Long term (current) use of anticoagulants: Secondary | ICD-10-CM

## 2020-08-14 DIAGNOSIS — E785 Hyperlipidemia, unspecified: Secondary | ICD-10-CM | POA: Diagnosis present

## 2020-08-14 DIAGNOSIS — E1122 Type 2 diabetes mellitus with diabetic chronic kidney disease: Secondary | ICD-10-CM | POA: Diagnosis present

## 2020-08-14 DIAGNOSIS — E7800 Pure hypercholesterolemia, unspecified: Secondary | ICD-10-CM | POA: Diagnosis present

## 2020-08-14 DIAGNOSIS — N183 Chronic kidney disease, stage 3 unspecified: Secondary | ICD-10-CM | POA: Diagnosis present

## 2020-08-14 DIAGNOSIS — Z6826 Body mass index (BMI) 26.0-26.9, adult: Secondary | ICD-10-CM

## 2020-08-14 DIAGNOSIS — E876 Hypokalemia: Secondary | ICD-10-CM | POA: Diagnosis not present

## 2020-08-14 DIAGNOSIS — Z800 Family history of malignant neoplasm of digestive organs: Secondary | ICD-10-CM

## 2020-08-14 DIAGNOSIS — Z8616 Personal history of COVID-19: Secondary | ICD-10-CM

## 2020-08-14 DIAGNOSIS — I69391 Dysphagia following cerebral infarction: Secondary | ICD-10-CM

## 2020-08-14 DIAGNOSIS — I129 Hypertensive chronic kidney disease with stage 1 through stage 4 chronic kidney disease, or unspecified chronic kidney disease: Secondary | ICD-10-CM | POA: Diagnosis present

## 2020-08-14 DIAGNOSIS — Z8673 Personal history of transient ischemic attack (TIA), and cerebral infarction without residual deficits: Secondary | ICD-10-CM

## 2020-08-14 DIAGNOSIS — D509 Iron deficiency anemia, unspecified: Secondary | ICD-10-CM | POA: Diagnosis present

## 2020-08-14 DIAGNOSIS — N179 Acute kidney failure, unspecified: Secondary | ICD-10-CM | POA: Diagnosis present

## 2020-08-14 DIAGNOSIS — Z823 Family history of stroke: Secondary | ICD-10-CM

## 2020-08-14 DIAGNOSIS — R1312 Dysphagia, oropharyngeal phase: Secondary | ICD-10-CM | POA: Diagnosis present

## 2020-08-14 LAB — BASIC METABOLIC PANEL
Anion gap: 20 — ABNORMAL HIGH (ref 5–15)
Anion gap: 18 — ABNORMAL HIGH (ref 5–15)
BUN: 9 mg/dL (ref 8–23)
BUN: 11 mg/dL (ref 8–23)
CO2: 26 mmol/L (ref 22–32)
CO2: 27 mmol/L (ref 22–32)
Calcium: 9.2 mg/dL (ref 8.9–10.3)
Calcium: 9.1 mg/dL (ref 8.9–10.3)
Chloride: 89 mmol/L — ABNORMAL LOW (ref 98–111)
Chloride: 91 mmol/L — ABNORMAL LOW (ref 98–111)
Creatinine, Ser: 1.22 mg/dL — ABNORMAL HIGH (ref 0.44–1.00)
Creatinine, Ser: 1.13 mg/dL — ABNORMAL HIGH (ref 0.44–1.00)
GFR, Estimated: 45 mL/min — ABNORMAL LOW (ref >60)
GFR, Estimated: 49 mL/min — ABNORMAL LOW (ref >60)
Glucose, Bld: 119 mg/dL — ABNORMAL HIGH (ref 70–99)
Glucose, Bld: 79 mg/dL (ref 70–99)
Potassium: 2.7 mmol/L — CL (ref 3.5–5.1)
Potassium: 3.4 mmol/L — ABNORMAL LOW (ref 3.5–5.1)
Sodium: 135 mmol/L (ref 135–145)
Sodium: 136 mmol/L (ref 135–145)

## 2020-08-14 LAB — CBC
HCT: 35.9 % — ABNORMAL LOW (ref 36.0–46.0)
Hemoglobin: 11.4 g/dL — ABNORMAL LOW (ref 12.0–15.0)
MCH: 28.1 pg (ref 26.0–34.0)
MCHC: 31.8 g/dL (ref 30.0–36.0)
MCV: 88.6 fL (ref 80.0–100.0)
Platelets: 370 K/uL (ref 150–400)
RBC: 4.05 MIL/uL (ref 3.87–5.11)
RDW: 15.6 % — ABNORMAL HIGH (ref 11.5–15.5)
WBC: 8.1 K/uL (ref 4.0–10.5)
nRBC: 0 % (ref 0.0–0.2)

## 2020-08-14 LAB — HEPATIC FUNCTION PANEL
ALT: 12 U/L (ref 0–44)
AST: 18 U/L (ref 15–41)
Albumin: 3.1 g/dL — ABNORMAL LOW (ref 3.5–5.0)
Alkaline Phosphatase: 45 U/L (ref 38–126)
Bilirubin, Direct: 0.2 mg/dL (ref 0.0–0.2)
Indirect Bilirubin: 1.8 mg/dL — ABNORMAL HIGH (ref 0.3–0.9)
Total Bilirubin: 2 mg/dL — ABNORMAL HIGH (ref 0.3–1.2)
Total Protein: 6.3 g/dL — ABNORMAL LOW (ref 6.5–8.1)

## 2020-08-14 LAB — TROPONIN I (HIGH SENSITIVITY)
Troponin I (High Sensitivity): 21 ng/L — ABNORMAL HIGH
Troponin I (High Sensitivity): 20 ng/L — ABNORMAL HIGH (ref <18)

## 2020-08-14 LAB — MAGNESIUM: Magnesium: 1.7 mg/dL (ref 1.7–2.4)

## 2020-08-14 LAB — SARS CORONAVIRUS 2 (TAT 6-24 HRS): SARS Coronavirus 2: NEGATIVE

## 2020-08-14 MED ORDER — LACTATED RINGERS IV BOLUS
1000.0000 mL | Freq: Once | INTRAVENOUS | Status: AC
  Administered 2020-08-14: 1000 mL via INTRAVENOUS

## 2020-08-14 MED ORDER — APIXABAN 5 MG PO TABS
5.0000 mg | ORAL_TABLET | Freq: Two times a day (BID) | ORAL | Status: DC
  Administered 2020-08-15 – 2020-08-18 (×7): 5 mg via ORAL
  Filled 2020-08-14 (×8): qty 1

## 2020-08-14 MED ORDER — SODIUM CHLORIDE 0.9 % IV BOLUS
500.0000 mL | Freq: Once | INTRAVENOUS | Status: AC
  Administered 2020-08-14: 500 mL via INTRAVENOUS

## 2020-08-14 MED ORDER — AMOXICILLIN-POT CLAVULANATE 875-125 MG PO TABS
1.0000 | ORAL_TABLET | Freq: Two times a day (BID) | ORAL | Status: DC
  Administered 2020-08-14 – 2020-08-22 (×15): 1 via ORAL
  Filled 2020-08-14 (×15): qty 1

## 2020-08-14 MED ORDER — ACETAMINOPHEN 650 MG RE SUPP: 650.0000 mg | Freq: Four times a day (QID) | RECTAL | Status: DC | PRN

## 2020-08-14 MED ORDER — POTASSIUM CHLORIDE CRYS ER 20 MEQ PO TBCR: 40.0000 meq | EXTENDED_RELEASE_TABLET | Freq: Once | ORAL | Status: DC

## 2020-08-14 MED ORDER — MAGNESIUM SULFATE 2 GM/50ML IV SOLN
2.0000 g | Freq: Once | INTRAVENOUS | Status: AC
  Administered 2020-08-14: 2 g via INTRAVENOUS
  Filled 2020-08-14: qty 50

## 2020-08-14 MED ORDER — POTASSIUM CHLORIDE 10 MEQ/100ML IV SOLN
10.0000 meq | INTRAVENOUS | Status: AC
  Administered 2020-08-14 (×3): 10 meq via INTRAVENOUS
  Filled 2020-08-14 (×3): qty 100

## 2020-08-14 MED ORDER — LISINOPRIL 10 MG PO TABS: 10.0000 mg | ORAL_TABLET | Freq: Every day | ORAL | Status: DC

## 2020-08-14 MED ORDER — DILTIAZEM HCL ER 60 MG PO CP12
240.0000 mg | ORAL_CAPSULE | Freq: Every day | ORAL | Status: DC
  Administered 2020-08-15: 240 mg via ORAL
  Filled 2020-08-14 (×2): qty 4

## 2020-08-14 MED ORDER — ACETAMINOPHEN 325 MG PO TABS
650.0000 mg | ORAL_TABLET | Freq: Four times a day (QID) | ORAL | Status: DC | PRN
  Administered 2020-08-15 – 2020-08-17 (×3): 650 mg via ORAL
  Filled 2020-08-14 (×3): qty 2

## 2020-08-14 MED ORDER — POTASSIUM CHLORIDE 10 MEQ/100ML IV SOLN
10.0000 meq | INTRAVENOUS | Status: AC
  Administered 2020-08-14: 10 meq via INTRAVENOUS
  Filled 2020-08-14: qty 100

## 2020-08-14 MED ORDER — POTASSIUM CHLORIDE 20 MEQ PO PACK
40.0000 meq | PACK | Freq: Every day | ORAL | Status: DC
  Administered 2020-08-14 – 2020-08-18 (×5): 40 meq via ORAL
  Filled 2020-08-14 (×5): qty 2
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
## 2020-08-14 NOTE — ED Triage Notes (Signed)
Pt here from home with c/o gen weakness pt hada a syncopal episode while getting ready to go to the MD , pt has hx of covid and stroke in nov/dec of last year

## 2020-08-14 NOTE — ED Notes (Signed)
Attempted to give reportx1 

## 2020-08-14 NOTE — Progress Notes (Signed)
Had to ask pharmacy to reschedule potassium runs d/t loss of IV access in the ED prior to transport to 6N. Gave LR bolus as soon as access was established by IV team (Difficult Stick) and then started potassium runs slower than prescribed and Y-ed in to NS.  Pt resting comfortably.

## 2020-08-14 NOTE — ED Provider Notes (Signed)
[facility] EMERGENCY DEPARTMENT Provider Note   CSN: [id number] Arrival date & time: 08/14/20  2725     History No chief complaint on file.   Beverly Howell is a 80 y.o. female.  HPI   80 year old female history of high cholesterol, hypertension, stroke without significant residual deficits, right sided spontaneous pneumothorax who presents today with onset of lower extremity weakness.  She and her daughter report that she was at her baseline this morning.  They went to get her ready for her doctor's appointment and she had bilateral leg weakness when attempting to stand.  Her daughter brought her to the emergency department secondary to this.  She was going to see her neurologist and cardio thoracic surgeon today.  She reports that she has had some cough and dyspnea had any significant pain.  She has had decreased ability to swallow since her stroke and had decreased p.o. intake but has not noted anything new with this.  She denies headache, head pain, neck pain, chest pain, nausea, vomiting, diarrhea, fever or chills  Past Medical History:  Diagnosis Date  . High cholesterol   . Hypertension     Patient Active Problem List   Diagnosis Date Noted  . Necrotizing pneumonia (HCC) 05/20/2020  . History of CVA in adulthood 05/20/2020  . History of DVT of lower extremity 05/20/2020  . Acute CVA (cerebrovascular accident) (HCC) 04/22/2020  . Pneumonia due to COVID-19 virus 04/22/2020  . ARF (acute renal failure) (HCC) 04/22/2020  . Essential hypertension 04/22/2020  . Acute respiratory failure (HCC) 04/21/2020    Past Surgical History:  Procedure Laterality Date  . HAND SURGERY       OB History   No obstetric history on file.     Family History  Problem Relation Age of Onset  . Stroke Mother     Social History   Tobacco Use  . Smoking status: Former Games developer  . Smokeless tobacco: Never Used  Substance Use Topics  . Alcohol use: No  . Drug use: No     Home Medications Prior to Admission medications   Medication Sig Start Date End Date Taking? Authorizing Provider  acetaminophen (TYLENOL) 500 MG tablet Take 1,000 mg by mouth in the morning, at noon, and at bedtime.    [provider]  albuterol (VENTOLIN HFA) 108 (90 Base) MCG/ACT inhaler Inhale 2 puffs into the lungs every 8 (eight) hours as needed for wheezing.  05/01/20   [provider]  amLODipine (NORVASC) 5 MG tablet Take 1 tablet (5 mg total) by mouth daily. 04/29/20   Ghimire, Werner Lean, MD  apixaban (ELIQUIS) 5 MG TABS tablet Take 1 tablet (5 mg total) by mouth 2 (two) times daily. Start 11/17 evening. 06/25/20   Randall Hiss, MD  atorvastatin (LIPITOR) 40 MG tablet Take 1 tablet (40 mg total) by mouth daily. Patient not taking: No sig reported 04/29/20   Maretta Bees, MD  Calcium Carb-Cholecalciferol (CALCIUM 600-D PO) Take 1 tablet by mouth in the morning and at bedtime.     [provider]  cholecalciferol (VITAMIN D) 25 MCG (1000 UNIT) tablet Take 1,000 Units by mouth daily.     [provider]  diltiazem (DILACOR XR) 240 MG 24 hr capsule Take 240 mg by mouth daily.    [provider]  guaiFENesin (MUCINEX) 600 MG 12 hr tablet Take 1,200 mg by mouth 2 (two) times daily. Patient not taking: No sig reported    [provider]  ipratropium-albuterol (DUONEB) 0.5-2.5 (3) MG/3ML SOLN Take 3 mLs by nebulization 2 (two) times daily as needed. Patient not taking: Reported on 06/25/2020 05/12/20   [provider]  lidocaine (LMX) 4 % cream Apply 1 application topically 2 (two) times daily. Patient not taking: No sig reported    [provider]  lisinopril (ZESTRIL) 10 MG tablet Take 10 mg by mouth daily.    [provider]  metoprolol tartrate (LOPRESSOR) 25 MG tablet Take 2 tablets (50 mg total) by mouth 2 (two) times daily. 04/28/20   Ghimire, Werner Lean, MD  Omega-3 Fatty Acids (FISH OIL  TRIPLE STRENGTH) 1400 MG CAPS Take 1,400 mg by mouth daily.    [provider]  pantoprazole (PROTONIX) 40 MG tablet Take 1 tablet (40 mg total) by mouth daily. Patient not taking: No sig reported 04/29/20   Maretta Bees, MD  polyethylene glycol (MIRALAX / GLYCOLAX) 17 g packet Take 17 g by mouth every other day. Patient not taking: No sig reported    [provider]  sennosides-docusate sodium (SENOKOT-S) 8.6-50 MG tablet Take 2 tablets by mouth daily. Patient not taking: No sig reported    [provider]  simethicone (MYLICON) 125 MG chewable tablet Chew 125 mg by mouth in the morning and at bedtime. Patient not taking: No sig reported    [provider]    Allergies    Patient has no known allergies.  Review of Systems   Review of Systems  Physical Exam Updated Vital Signs BP 120/78   Pulse (!) 53   Temp 97.9 F (36.6 C) (Oral)   Resp 20   SpO2 100%   Physical Exam Elderly female nad Heent- no trauma noted Eyes perrl Ears normal externally bilaterally Neck reveals no JVD trachea is midline Posterior reveals no signs of trauma no tenderness palpation Chest wall is normal Lungs are clear to auscultation Heart is regular rate and rhythm Abdomen soft nontender Extremities show no obvious signs of trauma with full active range of motion Neurologically patient is alert and oriented x3 extraocular movements are intact No facial droop is noted No palmar drift is noted Patient is able to lift each leg off the bed for count of 4 but appears somewhat weak equally bilaterally Deep tendon reflexes are equal bilateral elbows and knees Psychiatric exam appears normal with patient with normal mood and interaction  ED Results / Procedures / Treatments   Labs (all labs ordered are listed, but only abnormal results are displayed) Labs Reviewed  BASIC METABOLIC PANEL - Abnormal; Notable for the following components:      Result Value    Potassium 2.7 (*)    Chloride 89 (*)    Glucose, Bld 119 (*)    Creatinine, Ser 1.22 (*)    GFR, Estimated 45 (*)    Anion gap 20 (*)    All other components within normal limits  CBC - Abnormal; Notable for the following components:   Hemoglobin 11.4 (*)    HCT 35.9 (*)    RDW 15.6 (*)    All other components within normal limits  TROPONIN I (HIGH SENSITIVITY) - Abnormal; Notable for the following components:   Troponin I (High Sensitivity) 21 (*)    All other components within normal limits  URINALYSIS, ROUTINE W REFLEX MICROSCOPIC  HEPATIC FUNCTION PANEL  MAGNESIUM  TROPONIN I (HIGH SENSITIVITY)    EKG EKG Interpretation  Date/Time:  Thursday August 14 2020 08:50:05 EST Ventricular  Rate:  60 PR Interval:  112 QRS Duration: 132 QT Interval:  454 QTC Calculation: 454 R Axis:   48 Text Interpretation: Sinus rhythm with sinus arrhythmia with Premature ventricular complexes or Fusion complexes Right bundle branch block T wave abnormality, consider inferolateral ischemia Abnormal ECG Confirmed by Margarita Grizzle [phone]) on 08/14/2020 11:45:26 AM   Radiology DG Chest 2 View  Result Date: 08/14/2020 CLINICAL DATA:  Syncope, weakness. EXAM: CHEST - 2 VIEW COMPARISON:  June 19, 2020.  May 19, 2020. FINDINGS: The heart size and mediastinal contours are within normal limits. Small loculated right pleural effusion is noted. Minimal left basilar subsegmental atelectasis is noted. Margin of large air collection is again noted medially in the right lung suggesting sequela of previous cavitating pneumonia; it does not appear to be as thick walled as noted previously. Left lung is clear. The visualized skeletal structures are unremarkable. IMPRESSION: Stable small loculated right pleural effusion. Continued presence of large air collection seen in right lung suggesting sequela of previous cavitating pneumonia; it does not appear to be as thick walled as noted previously. Electronically Signed    By: Lupita Raider M.D.   On: 08/14/2020 09:28    Procedures Procedures   Medications Ordered in ED Medications  sodium chloride 0.9 % bolus 500 mL (has no administration in time range)  potassium chloride SA (KLOR-CON) CR tablet 40 mEq (has no administration in time range)  potassium chloride 10 mEq in 100 mL IVPB (has no administration in time range)  magnesium sulfate IVPB 2 g 50 mL (has no administration in time range)    ED Course  I have reviewed the triage vital signs and the nursing notes.  Pertinent labs & imaging results that were available during my care of the patient were reviewed by me and considered in my medical decision making (see chart for details).    MDM Rules/Calculators/A&P                          80 year old female history of stroke and hydropneumothorax presents today complaining of generalized weakness.  Patient with normal neurological exam here in the ED.  She reports that her movement was normal earlier in the day and she became generally weak upon attempting to go to her physician appointments today.  Her daughter is present here at the bedside and confirms the above.  She has had some decreased p.o. intake due to swallowing difficulty since the stroke but has had no acute changes.  Work-up here is significant for hypokalemia with potassium of 2.7 and chloride of 89.  Creatinine is increased from baseline of 0.78-1.22 today. Patient is having IV fluids ordered as well as p.o. IV potassium and magnesium. Patient has nonspecific ST-T wave changes on EKG which could be consistent with her hypokalemia.  Patient does not have any complaints of chest pain or dyspnea and have a low index of suspicion for cardiac etiology. Chest x-Lyberti Thrush is significant for loculated right pleural effusion which appears consistent with her prior cavitating pneumonia. Will need to have ct surgery assess during admission  Plan IV fluids and potassium and magnesium replenishment.  Will  consult for inpatient management Final Clinical Impression(s) / ED Diagnoses Final diagnoses:  Hypokalemia  Weakness    Rx / DC Orders ED Discharge Orders    None       Margarita Grizzle, MD 08/14/20 [phone]

## 2020-08-14 NOTE — ED Notes (Signed)
Attempted to give report x2 

## 2020-08-15 ENCOUNTER — Observation Stay (HOSPITAL_COMMUNITY): Payer: Medicare (Managed Care)

## 2020-08-15 ENCOUNTER — Observation Stay (HOSPITAL_BASED_OUTPATIENT_CLINIC_OR_DEPARTMENT_OTHER): Payer: Medicare (Managed Care)

## 2020-08-15 DIAGNOSIS — I6389 Other cerebral infarction: Secondary | ICD-10-CM | POA: Diagnosis not present

## 2020-08-15 DIAGNOSIS — I639 Cerebral infarction, unspecified: Secondary | ICD-10-CM

## 2020-08-15 DIAGNOSIS — I6310 Cerebral infarction due to embolism of unspecified precerebral artery: Secondary | ICD-10-CM | POA: Diagnosis not present

## 2020-08-15 DIAGNOSIS — R1310 Dysphagia, unspecified: Secondary | ICD-10-CM | POA: Diagnosis not present

## 2020-08-15 DIAGNOSIS — I351 Nonrheumatic aortic (valve) insufficiency: Secondary | ICD-10-CM | POA: Diagnosis not present

## 2020-08-15 LAB — CBC
HCT: 30.1 % — ABNORMAL LOW (ref 36.0–46.0)
Hemoglobin: 10.1 g/dL — ABNORMAL LOW (ref 12.0–15.0)
MCH: 29 pg (ref 26.0–34.0)
MCHC: 33.6 g/dL (ref 30.0–36.0)
MCV: 86.5 fL (ref 80.0–100.0)
Platelets: 350 10*3/uL (ref 150–400)
RBC: 3.48 MIL/uL — ABNORMAL LOW (ref 3.87–5.11)
RDW: 16.1 % — ABNORMAL HIGH (ref 11.5–15.5)
WBC: 6.5 10*3/uL (ref 4.0–10.5)
nRBC: 0 % (ref 0.0–0.2)

## 2020-08-15 LAB — URINALYSIS, ROUTINE W REFLEX MICROSCOPIC
Bilirubin Urine: NEGATIVE
Glucose, UA: 50 mg/dL — AB
Hgb urine dipstick: NEGATIVE
Ketones, ur: 20 mg/dL — AB
Nitrite: NEGATIVE
Protein, ur: NEGATIVE mg/dL
Specific Gravity, Urine: 1.01 (ref 1.005–1.030)
pH: 6 (ref 5.0–8.0)

## 2020-08-15 LAB — ECHOCARDIOGRAM COMPLETE
Area-P 1/2: 2.76 cm2
S' Lateral: 1.7 cm
Weight: 2402.13 oz

## 2020-08-15 LAB — BASIC METABOLIC PANEL
Anion gap: 12 (ref 5–15)
BUN: 6 mg/dL — ABNORMAL LOW (ref 8–23)
CO2: 31 mmol/L (ref 22–32)
Calcium: 8.9 mg/dL (ref 8.9–10.3)
Chloride: 96 mmol/L — ABNORMAL LOW (ref 98–111)
Creatinine, Ser: 0.93 mg/dL (ref 0.44–1.00)
GFR, Estimated: >60 mL/min (ref >60)
Glucose, Bld: 71 mg/dL (ref 70–99)
Potassium: 3.7 mmol/L (ref 3.5–5.1)
Sodium: 139 mmol/L (ref 135–145)

## 2020-08-15 LAB — HEMOGLOBIN A1C
Hgb A1c MFr Bld: 5.3 % (ref 4.8–5.6)
Mean Plasma Glucose: 105.41 mg/dL

## 2020-08-15 LAB — GLUCOSE, CAPILLARY
Glucose-Capillary: 61 mg/dL — ABNORMAL LOW (ref 70–99)
Glucose-Capillary: 99 mg/dL (ref 70–99)

## 2020-08-15 LAB — MAGNESIUM: Magnesium: 2 mg/dL (ref 1.7–2.4)

## 2020-08-15 MED ORDER — ENSURE ENLIVE PO LIQD
237.0000 mL | Freq: Three times a day (TID) | ORAL | Status: DC
  Administered 2020-08-15 – 2020-08-21 (×16): 237 mL via ORAL

## 2020-08-15 MED ORDER — DEXTROSE 50 % IV SOLN
12.5000 g | INTRAVENOUS | Status: AC
  Administered 2020-08-15: 12.5 g via INTRAVENOUS
  Filled 2020-08-15: qty 50

## 2020-08-15 MED ORDER — ATORVASTATIN CALCIUM 40 MG PO TABS
40.0000 mg | ORAL_TABLET | Freq: Every day | ORAL | Status: DC
  Administered 2020-08-15 – 2020-08-22 (×7): 40 mg via ORAL
  Filled 2020-08-15 (×7): qty 1

## 2020-08-15 MED ORDER — ADULT MULTIVITAMIN W/MINERALS CH
1.0000 | ORAL_TABLET | Freq: Every day | ORAL | Status: DC
  Administered 2020-08-15 – 2020-08-22 (×7): 1 via ORAL
  Filled 2020-08-15 (×8): qty 1

## 2020-08-15 NOTE — Evaluation (Signed)
Clinical/Bedside Swallow Evaluation Patient Details  Name: Beverly Howell MRN: [id number] Date of Birth: 01-10-1941  Today's Date: 08/15/2020 Time: SLP Start Time (ACUTE ONLY): 0912 SLP Stop Time (ACUTE ONLY): 0921 SLP Time Calculation (min) (ACUTE ONLY): 9 min  Past Medical History:  Past Medical History:  Diagnosis Date  . High cholesterol   . Hypertension    Past Surgical History:  Past Surgical History:  Procedure Laterality Date  . 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 Pt reports swallowing difficulty since November, but not immediately following stroke.  Pt was not seen by ST during admission for CVA, presumably because of no dysphagia at that time.   Assessment / Plan / Recommendation Clinical Impression  Pt tolerate all consistencies trialed today with no clinical s/s of aspiration; however, a concern for prandial aspiration still exists.  Pt c/o difficulty swallowing and describes globus sensation, suggestive of esophageal dysphagia.  Pt reports no difficulty with liquids only solids and endorses that puree textrues are easier to consume.  She states that she will feel something stick at times and inidicates area around laryngeal cartilage and then coughs up something yellow.  Pt also reports runny nose with PO intake, decreased appetite, and recent, significant, unintended weight loss. Pt has had recent pna as well.   Instrumental swallow evaluation is indicated for further assessment of pharyngeal swallow function.  MBSS scheduled for later this date.   SLP Visit Diagnosis: Dysphagia, unspecified (R13.10)    Aspiration Risk  Mild aspiration risk    Diet Recommendation NPO (pending results of  MBSS, may have puree snacks and thin liquids for comfort)   Liquid Administration via: Cup;Straw Medication Administration:  (pending results of MBSS) Supervision: Patient able to self feed    Other  Recommendations     Follow up Recommendations  (TBD)      Frequency and Duration  (TBD)          Prognosis Prognosis for Safe Diet Advancement: Good      Swallow Study   General Date of Onset: 08/15/20 HPI: This is 80 year old female with a history of HTN, HLD, CKD stage III, and recent COVID infection with 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 Pt reports swallowing difficulty since November, but not immediately following stroke.  Pt was not seen by ST during admission for CVA, presumably because of no dysphagia at that time. Type of Study: Bedside Swallow Evaluation Previous Swallow Assessment: none Diet Prior to this Study: NPO Temperature Spikes Noted: No Respiratory Status: Room air History of Recent Intubation: No Behavior/Cognition: Cooperative;Alert;Pleasant mood Oral Cavity Assessment: Within Functional Limits Oral Care Completed by SLP: No Oral Cavity - Dentition: Missing dentition Vision: Functional for self-feeding Self-Feeding Abilities: Able to feed self Patient Positioning: Upright in chair Baseline Vocal Quality: Normal Volitional Cough: Strong Volitional Swallow: Able to elicit    Oral/Motor/Sensory Function Overall Oral Motor/Sensory Function: Mild impairment Facial ROM: Within Functional Limits Facial Symmetry: Within Functional Limits Lingual ROM: Within Functional Limits Lingual Symmetry: Within Functional Limits Lingual Strength: Within Functional Limits Velum: Impaired left Mandible: Within Functional Limits  Ice Chips Ice chips: Not tested   Thin Liquid Thin Liquid: Within functional  limits Presentation: Cup;Straw    Nectar Thick Nectar Thick Liquid: Not tested   Honey Thick Honey Thick Liquid: Not tested   Puree Puree: Within functional limits Presentation: Spoon;Self Fed   Solid     Solid: Within functional limits Presentation: Self Fed      Kerrie Pleasure, MA, CCC-SLP Acute Rehabilitation Services Office: [phone]  08/15/2020,9:37 AM

## 2020-08-15 NOTE — Progress Notes (Signed)
Initial Nutrition Assessment  DOCUMENTATION CODES:   Not applicable  INTERVENTION:   -Initiate 48 hour calorie count per MD request; RD to follow-up on Monday, 08/18/20 for results -Ensure Enlive po TID, each supplement provides 350 kcal and 20 grams of protein -MVI with minerals daily -Magic cup TID with meals, each supplement provides 290 kcal and 9 grams of protein  NUTRITION DIAGNOSIS:   Predicted suboptimal nutrient intake related to dysphagia,decreased appetite as evidenced by percent weight loss.  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  Pt admitted with generalized weakness and hypokalemia due to inadequate potassium intake.   Reviewed I/O's: -250 ml x 24 hours  UOP: 450 ml x 24 hours  Pt unavailable at time of visit.   Pt currently on a dysphagia 1 diet with thin liquids. SLP evaluated earlier this morning; plan for MBSS today. No meal completion data available to assess at this time.   Reviewed wt hx; pt has experienced a 16.4% wt loss over the past 3 months, which is significant for time frame.   Pt is at high risk for malnutrition, however, unable t identify at this time. Pt would greatly benefit from addition of oral nutrition supplements.   Medications reviewed and include potassium chloride.  Lab Results  Component Value Date   HGBA1C 5.5 04/22/2020   PTA DM medications are none.   Labs reviewed: CBGS: 61-99  (inpatient orders for glycemic control are none).   Diet Order:   Diet Order            Diet regular Room service appropriate? Yes; Fluid consistency: Thin  Diet effective now                 EDUCATION NEEDS:   No education needs have been identified at this time  Skin:  Skin Assessment: Reviewed RN Assessment  Last BM:  08/14/20  Height:   Ht Readings from Last 1 Encounters:  06/19/20 5\' 3"  (1.6 m)    Weight:   Wt Readings from Last 1 Encounters:  08/15/20 68.1 kg    Ideal Body Weight:  52.3 kg  BMI:  Body mass index is 26.59 kg/m.  Estimated Nutritional Needs:   Kcal:  2050-2250  Protein:  105-120 grams  Fluid:  > 2 L    01-14-1979, RD, LDN, CDCES Registered Dietitian II Certified Diabetes Care and Education Specialist Please refer to [facility] for RD and/or RD on-call/weekend/after hours pager

## 2020-08-15 NOTE — Evaluation (Addendum)
Occupational Therapy Evaluation [name] MRN: [id number] DOB: 1940-09-02 Today's Date: 08/15/2020    History of Present Illness Pt is a 80 year old woman admitted from home with her daughter with weakness experienced at the bottom of the stairs when she was going to the doctor. +hypokalemia. PMH: recent COVID, CVA, necrotizing PNA, T PTX, HTN, HLD, CKD III, dysphagia, MSSA empyema, B LE DVT.   Clinical Impression   Pt reports walking with a walker except in the kitchen where she could hold the countertop. She is assisted for bathing, meal prep and housekeeping, but can warm up meals left for herself while her daughter works. Pt is likely near her baseline in ADL and mobility. Supervised OOB to bathroom/sink this visit. Will follow acutely, but do not anticipate pt will require OT upon discharge.     Follow Up Recommendations  No OT follow up    Equipment Recommendations  None recommended by OT    Recommendations for Other Services       Precautions / Restrictions Precautions Precautions: Fall Precaution Comments: denies falls at home Restrictions Weight Bearing Restrictions: No      Mobility Bed Mobility Overal bed mobility: Modified Independent             General bed mobility comments: increased time, HOB up    Transfers Overall transfer level: Needs assistance Equipment used: Rolling walker (2 wheeled) Transfers: Sit to/from Stand Sit to Stand: Supervision         General transfer comment: cues for hand placement with stand to sit    Balance Overall balance assessment: Needs assistance   Sitting balance-Leahy Scale: Good Sitting balance - Comments: no LOB with donning socks     Standing balance-Leahy Scale: Poor Standing balance comment: requires on hand stability on walker with pericare                           ADL either performed or assessed with clinical judgement   ADL Overall ADL's : Needs  assistance/impaired Eating/Feeding: NPO   Grooming: Oral care;Wash/dry hands;Standing;Supervision/safety   Upper Body Bathing: Minimal assistance;Sitting   Lower Body Bathing: Minimal assistance;Sit to/from stand   Upper Body Dressing : Set up;Sitting   Lower Body Dressing: Supervision/safety;Sit to/from stand   Toilet Transfer: Supervision/safety;Ambulation;RW;BSC   Toileting- Clothing Manipulation and Hygiene: Minimal assistance;Sit to/from stand       Functional mobility during ADLs: Supervision/safety;Rolling walker       Vision Patient Visual Report: No change from baseline       Perception     Praxis      Pertinent Vitals/Pain Pain Assessment: No/denies pain     Hand Dominance Right   Extremity/Trunk Assessment Upper Extremity Assessment Upper Extremity Assessment: RUE deficits/detail RUE Deficits / Details: long standing shoulder limitations RUE Coordination: decreased gross motor   Lower Extremity Assessment Lower Extremity Assessment: Defer to PT evaluation       Communication Communication Communication: HOH   Cognition Arousal/Alertness: Awake/alert Behavior During Therapy: WFL for tasks assessed/performed Overall Cognitive Status: Within Functional Limits for tasks assessed                                     General Comments       Exercises     Shoulder Instructions      Home Living Family/patient expects to be discharged to:: Private  residence Living Arrangements: Children (daughter) Available Help at Discharge: Family;Available PRN/intermittently (daughter works days) Type of Home: Apartment Home Access: Stairs to enter Secretary/administrator of Steps: flight (on second floor) Entrance Stairs-Rails: Right;Left Home Layout: One level     Bathroom Shower/Tub: Chief Strategy Officer: Standard     Home Equipment: Environmental consultant - 2 wheels;Bedside commode;Cane - single point   Additional Comments: daughter  leaves food prepared for her when she goes to work      Prior Functioning/Environment Level of Independence: Needs assistance  Gait / Transfers Assistance Needed: walks with RW ADL's / Homemaking Assistance Needed: assisted for showering, can dress, toilet, self feed and reheat meals            OT Problem List: Impaired balance (sitting and/or standing);Decreased knowledge of use of DME or AE      OT Treatment/Interventions: Self-care/ADL training;Patient/family education    OT Goals(Current goals can be found in the care plan section) Acute Rehab OT Goals Patient Stated Goal: to go home OT Goal Formulation: With patient Time For Goal Achievement: 08/29/20 Potential to Achieve Goals: Good ADL Goals Pt Will Perform Grooming: with modified independence;standing Pt Will Transfer to Toilet: with modified independence;ambulating;bedside commode (over toilet) Pt Will Perform Toileting - Clothing Manipulation and hygiene: with modified independence;sit to/from stand Additional ADL Goal #1: Pt will gather items necessary for ADL around her room modified independently.  OT Frequency: Min 2X/week   Barriers to D/C:            Co-evaluation              AM-PAC OT "6 Clicks" Daily Activity     Outcome Measure Help from another person eating meals?: None Help from another person taking care of personal grooming?: A Little Help from another person toileting, which includes using toliet, bedpan, or urinal?: A Little Help from another person bathing (including washing, rinsing, drying)?: A Little Help from another person to put on and taking off regular upper body clothing?: None Help from another person to put on and taking off regular lower body clothing?: A Little 6 Click Score: 20   End of Session Equipment Utilized During Treatment: Gait belt;Rolling walker  Activity Tolerance: Patient tolerated treatment well Patient left: in chair;with call bell/phone within reach;with  chair alarm set  OT Visit Diagnosis: Unsteadiness on feet (R26.81);Other abnormalities of gait and mobility (R26.89)                Time: 5638-9373 OT Time Calculation (min): 26 min Charges:  OT General Charges $OT Visit: 1 Visit OT Evaluation $OT Eval Moderate Complexity: 1 Mod, 1,[place]  Martie Round, OTR/L Acute Rehabilitation Services Pager: [phone] Office: [phone]  Evern Bio 08/15/2020, 9:20 AM

## 2020-08-15 NOTE — Progress Notes (Addendum)
HD#0 Subjective:  Overnight Events: Admitted overnight  [name] states that she feels well overall. Her breathing and weakness have improved since yesterday and she was able to get up and walk around. She notes she has lost 43 lbs from her baseline of 183 lbs. She continues to have trouble swallowing with discomfort in her throat. She feels she chokes after eating and coughs up yellowish mucus.  She feels as though this has worsened over the past day or so.  She also endorses continued weakness of her lower extremities.  She denies a history of COPD, stating she just has HTN. She states she has been taking her statin as well as her Eliquis.  Objective:  Vital signs in last 24 hours:       Vitals:   08/15/20 0208 08/15/20 0500 08/15/20 0532 08/15/20 1439  BP: 129/70  119/60 112/70  Pulse: 68  63 75  Resp: 16  16 16   Temp: 98.1 F (36.7 C)  97.7 F (36.5 C) 97.8 F (36.6 C)  TempSrc: Oral  Oral Oral  SpO2: 100%  98% 100%  Weight:  68.1 kg     Supplemental O2: Room Air SpO2: 100 %   Physical Exam:  Constitutional: well-appearing, sitting upright in recliner HENT: normocephalic atraumatic Eyes: conjunctiva non-erythematous.  PERRL. EOMI Neck: supple Cardiovascular: regular rate and rhythm, no m/r/g Pulmonary/Chest: normal work of breathing on room air, lungs clear to auscultation bilaterally Abdominal: non-distended MSK: normal bulk and tone Neurological: alert & oriented x 3. No facial asymmetry.  Facial sensation normal bilaterally.  No uvular deviation.  No tongue fasciculations.  Normal finger-to-nose.  Bilateral shoulder shrug weakness.  Right upper extremity and left upper extremity 2/5 strength. Bilateral lower extremity weakness 1/5 on the right, 2/5 on the left.  Unable to perform heel-to-shin with right lower extremity.  Light touch intact throughout. Skin: warm and dry Psych: Normal mood     Filed Weights   08/15/20 0500  Weight: 68.1  kg     Intake/Output Summary (Last 24 hours) at 08/15/2020 1750 Last data filed at 08/15/2020 1200    Gross per 24 hour  Intake 380 ml  Output 450 ml  Net -70 ml   Net IO Since Admission: -70 mL [08/15/20 1750]  Pertinent Labs: CBC Latest Ref Rng & Units 08/15/2020 08/14/2020 06/06/2020  WBC 4.0 - 10.5 K/uL 6.5 8.1 8.3  Hemoglobin 12.0 - 15.0 g/dL 10.1(L) 11.4(L) 10.2(L)  Hematocrit 36.0 - 46.0 % 30.1(L) 35.9(L) 31.9(L)  Platelets 150 - 400 K/uL 350 370 480(H)    CMP Latest Ref Rng & Units 08/15/2020 08/14/2020 08/14/2020  Glucose 70 - 99 mg/dL 71 79 161(W119(H)  BUN 8 - 23 mg/dL 6(L) 11 9  Creatinine 9.600.44 - 1.00 mg/dL 4.540.93 0.98(J1.13(H) 1.91(Y1.22(H)  Sodium 135 - 145 mmol/L 139 136 135  Potassium 3.5 - 5.1 mmol/L 3.7 3.4(L) 2.7(LL)  Chloride 98 - 111 mmol/L 96(L) 91(L) 89(L)  CO2 22 - 32 mmol/L 31 27 26   Calcium 8.9 - 10.3 mg/dL 8.9 9.1 9.2  Total Protein 6.5 - 8.1 g/dL - - 6.3(L)  Total Bilirubin 0.3 - 1.2 mg/dL - - 2.0(H)  Alkaline Phos 38 - 126 U/L - - 45  AST 15 - 41 U/L - - 18  ALT 0 - 44 U/L - - 12    Imaging: MR BRAIN WO CONTRAST  Result Date: 08/15/2020 CLINICAL DATA:  weakness EXAM: MRI HEAD WITHOUT CONTRAST TECHNIQUE: Multiplanar, multiecho pulse sequences of  the brain and surrounding structures were obtained without intravenous contrast. COMPARISON:  Concurrent MRV head.  04/22/2020 and prior. FINDINGS: Brain: 3 mm right centrum semiovale DWI hyperintensity. Slightly larger DWI hyperintensity involving the right corona radiata. Sequela of small remote right MCA territory insults, better demonstrated on prior exam. Small remote right parietal hemorrhages. No midline shift, ventriculomegaly or extra-axial fluid collection. No mass lesion. Chronic left cerebellar insult. Mild chronic microvascular ischemic changes. Vascular: Major intracranial flow voids are preserved at the skull base. Prominent flow voids on SWI involving the right corona radiata and occipital region. Skull and upper  cervical spine: Normal marrow signal. Sinuses/Orbits: Normal orbits. Pneumatized paranasal sinuses. Trace mastoid free fluid. Other: None. IMPRESSION: 3 mm subacute right centrum semiovale infarct. Slightly larger subacute infarct involving the right corona radiata. 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 Consider postcontrast MRI for better characterization. Multifocal remote chronic insults as detailed above. Mild chronic microvascular ischemic changes. These results will be called to the ordering clinician or representative by the Radiologist Assistant, and communication documented in the PACS or [employer]. Electronically Signed   By: Stana Bunting M.D.   On: 08/15/2020 13:02   DG Swallowing Func-Speech Pathology  Result Date: 08/15/2020 Objective Swallowing Evaluation: Type of Study: MBS-Modified Barium Swallow Study  [name] MRN: [id number] Date of Birth: Feb 01, 1941 Today's Date: 08/15/2020 Time: SLP Start Time (ACUTE ONLY): 1043 -SLP Stop Time (ACUTE ONLY): 1054 SLP Time Calculation (min) (ACUTE ONLY): 11 min Past Medical History: Past Medical History: Diagnosis Date . High cholesterol  . Hypertension  Past Surgical History: Past Surgical History: Procedure Laterality Date . HAND SURGERY   HPI: This is 80 year old female with a history of HTN, HLD, CKD stage III, and recent COVID infection with 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 Pt reports swallowing difficulty since November, but not immediately following stroke.  Pt was not seen by ST during admission for CVA, presumably because of no dysphagia at that time.  Subjective: Pt awake, alert, pleasant, participative Assessment / Plan / Recommendation CHL IP CLINICAL  IMPRESSIONS 08/15/2020 Clinical Impression Pt presents with normal oropharyngeal swallow function.  There was no penetration or aspiration of any consistencies trialed.  There was no pharyngeal residue. Swallow was intermittently delayed to vallecula or pyriform sinuses with thin liquids, but remains WFL, especially considering pt's age.  There was stasis of contrast noted in upper esophagus with puree and solid textures.  Pt stated that she felt something was stuck or she would have to bring something up on several occaision and bolus trials were paused to allow this sensation to pass.  With pill simulation there was initially difficulty with oral transit at pill was expectorated, but pt was able to clear pill on second attempt when self-administered.  On esophageal sweep, tablet could not be located, but there was contrast material remaining in esophagus. Of note, there was mild narrowing of the pharynx, which seems to be related to cervical osteophytes which did not impair swallow function.  Recommend regular texture diet with thin liquid. Consider GI consult as pt's dysphagia symptoms are impacting her level of function and seem to be causing her distress. SLP Visit Diagnosis Dysphagia, unspecified (R13.10) Attention and concentration deficit following -- Frontal lobe and executive function deficit following -- Impact on safety and  function No limitations   CHL IP TREATMENT RECOMMENDATION 08/15/2020 Treatment Recommendations No treatment recommended at this time   Prognosis 08/15/2020 Prognosis for Safe Diet Advancement (No Data) Barriers to Reach Goals -- Barriers/Prognosis Comment -- CHL IP DIET RECOMMENDATION 08/15/2020 SLP Diet Recommendations Regular solids;Thin liquid Liquid Administration via Cup;Straw Medication Administration Whole meds with liquid Compensations Slow rate;Small sips/bites Postural Changes Seated upright at 90 degrees;Remain semi-upright after after feeds/meals (Comment)   CHL IP OTHER  RECOMMENDATIONS 08/15/2020 Recommended Consults Consider GI evaluation;Consider esophageal assessment Oral Care Recommendations Oral care BID Other Recommendations --   CHL IP FOLLOW UP RECOMMENDATIONS 08/15/2020 Follow up Recommendations None   CHL IP FREQUENCY AND DURATION 08/15/2020 Speech Therapy Frequency (ACUTE ONLY) (No Data) Treatment Duration --      CHL IP ORAL PHASE 08/15/2020 Oral Phase Impaired Oral - Pudding Teaspoon -- Oral - Pudding Cup -- Oral - Honey Teaspoon -- Oral - Honey Cup -- Oral - Nectar Teaspoon -- Oral - Nectar Cup -- Oral - Nectar Straw -- Oral - Thin Teaspoon -- Oral - Thin Cup WFL Oral - Thin Straw WFL Oral - Puree WFL Oral - Mech Soft -- Oral - Regular Piecemeal swallowing Oral - Multi-Consistency -- Oral - Pill Reduced posterior propulsion Oral Phase - Comment --  CHL IP PHARYNGEAL PHASE 08/15/2020 Pharyngeal Phase Impaired Pharyngeal- Pudding Teaspoon -- Pharyngeal -- Pharyngeal- Pudding Cup -- Pharyngeal -- Pharyngeal- Honey Teaspoon -- Pharyngeal -- Pharyngeal- Honey Cup -- Pharyngeal -- Pharyngeal- Nectar Teaspoon -- Pharyngeal -- Pharyngeal- Nectar Cup -- Pharyngeal -- Pharyngeal- Nectar Straw -- Pharyngeal -- Pharyngeal- Thin Teaspoon -- Pharyngeal -- Pharyngeal- Thin Cup Delayed swallow initiation-vallecula;Delayed swallow initiation-pyriform sinuses Pharyngeal Material does not enter airway Pharyngeal- Thin Straw Delayed swallow initiation-vallecula;Delayed swallow initiation-pyriform sinuses Pharyngeal Material does not enter airway Pharyngeal- Puree WFL Pharyngeal Material does not enter airway Pharyngeal- Mechanical Soft -- Pharyngeal -- Pharyngeal- Regular WFL Pharyngeal Material does not enter airway Pharyngeal- Multi-consistency -- Pharyngeal -- Pharyngeal- Pill WFL Pharyngeal Material does not enter airway Pharyngeal Comment --  CHL IP CERVICAL ESOPHAGEAL PHASE 08/15/2020 Cervical Esophageal Phase Impaired Pudding Teaspoon -- Pudding Cup -- Honey Teaspoon -- Honey Cup -- Nectar  Teaspoon -- Nectar Cup -- Nectar Straw -- Thin Teaspoon -- Thin Cup WFL Thin Straw WFL Puree (No Data) Mechanical Soft -- Regular (No Data) Multi-consistency -- Pill WFL Cervical Esophageal Comment -- Kerrie Pleasure, MA, CCC-SLP Acute Rehabilitation Services Office: [phone] 08/15/2020, 11:43 AM              MR MRV HEAD WO CM  Result Date: 08/15/2020 CLINICAL DATA:  Weakness of both lower extremities. EXAM: MR VENOGRAM OF THE HEAD WITHOUT CONTRAST TECHNIQUE: Angiographic images of the intracranial venous structures were obtained using MRV technique without intravenous contrast. COMPARISON:  04/22/2020 FINDINGS: Superior sagittal sinus is patent and normal. Both transverse sinuses are patent, with the left being larger than the right. Sigmoid sinuses and jugular veins are patent. Deep veins are patent. No visible superficial thrombosis. IMPRESSION: Normal intracranial venography. Electronically Signed   By: Paulina Fusi M.D.   On: 08/15/2020 11:40   ECHOCARDIOGRAM COMPLETE  Result Date: 08/15/2020    ECHOCARDIOGRAM REPORT   Patient Name:   Beverly Howell Date of Exam: 08/15/2020 Medical Rec #:  [id number]     Height:       63.0 in Accession #:    [id number]    Weight:       150.1 lb Date of Birth:  10-17-1940  BSA:          1.712 m Patient Age:    80 years      BP:           112/70 mmHg Patient Gender: F             HR:           83 bpm. Exam Location:  Inpatient Procedure: 2D Echo Indications:    stroke  History:        Patient has prior history of Echocardiogram examinations, most                 recent 04/23/2020. Abnormal ECG, chronic kidney disease; Risk                 Factors:Hypertension and Dyslipidemia.  Sonographer:    Delcie Roch Referring Phys: Stefanie.Gip EMILY B MULLEN  Sonographer Comments: Image acquisition challenging due to respiratory motion. IMPRESSIONS  1. Left ventricular ejection fraction, by estimation, is >75%. The left ventricle has hyperdynamic function. The left ventricle has no  regional wall motion abnormalities. Left ventricular diastolic parameters are consistent with Grade I diastolic dysfunction (impaired relaxation).  2. Right ventricular systolic function is hyperdynamic. The right ventricular size is normal. There is mildly elevated pulmonary artery systolic pressure. The estimated right ventricular systolic pressure is 43.4 mmHg.  3. The mitral valve was not well visualized. Trivial mitral valve regurgitation.  4. The aortic valve was not well visualized. There is moderate calcification of the aortic valve. Aortic valve regurgitation is not visualized. Mild to moderate aortic valve sclerosis/calcification is present, without any evidence of aortic stenosis.  5. The inferior vena cava is normal in size with greater than 50% respiratory variability, suggesting right atrial pressure of 3 mmHg. Comparison(s): Changes from prior study are noted. 04/23/2020: LVEF 60-65%. FINDINGS  Left Ventricle: Left ventricular ejection fraction, by estimation, is >75%. The left ventricle has hyperdynamic function. The left ventricle has no regional wall motion abnormalities. The left ventricular internal cavity size was small. There is no left  ventricular hypertrophy. Left ventricular diastolic parameters are consistent with Grade I diastolic dysfunction (impaired relaxation). Indeterminate filling pressures. Right Ventricle: The right ventricular size is normal. No increase in right ventricular wall thickness. Right ventricular systolic function is hyperdynamic. There is mildly elevated pulmonary artery systolic pressure. The tricuspid regurgitant velocity is 3.18 m/s, and with an assumed right atrial pressure of 3 mmHg, the estimated right ventricular systolic pressure is 43.4 mmHg. Left Atrium: Left atrial size was normal in size. Right Atrium: Right atrial size was normal in size. Pericardium: There is no evidence of pericardial effusion. Mitral Valve: The mitral valve was not well visualized.  Trivial mitral valve regurgitation. Tricuspid Valve: The tricuspid valve is grossly normal. Tricuspid valve regurgitation is mild. Aortic Valve: The aortic valve was not well visualized. There is moderate calcification of the aortic valve. Aortic valve regurgitation is not visualized. Mild to moderate aortic valve sclerosis/calcification is present, without any evidence of aortic stenosis. Pulmonic Valve: The pulmonic valve was normal in structure. Pulmonic valve regurgitation is not visualized. Aorta: The aortic arch was not well visualized. Venous: The inferior vena cava is normal in size with greater than 50% respiratory variability, suggesting right atrial pressure of 3 mmHg. IAS/Shunts: No atrial level shunt detected by color flow Doppler.  LEFT VENTRICLE PLAX 2D LVIDd:         3.60 cm  Diastology LVIDs:         1.70  cm  LV e' medial:    8.49 cm/s LV PW:         1.00 cm  LV E/e' medial:  8.3 LV IVS:        1.00 cm  LV e' lateral:   9.03 cm/s LVOT diam:     1.70 cm  LV E/e' lateral: 7.8 LVOT Area:     2.27 cm  RIGHT VENTRICLE         IVC TAPSE (M-mode): 1.6 cm  IVC diam: 1.50 cm LEFT ATRIUM             Index       RIGHT ATRIUM          Index LA diam:        2.70 cm 1.58 cm/m  RA Area:     8.87 cm LA Vol (A2C):   28.1 ml 16.42 ml/m RA Volume:   16.90 ml 9.87 ml/m LA Vol (A4C):   24.8 ml 14.49 ml/m LA Biplane Vol: 26.7 ml 15.60 ml/m   AORTA Ao Root diam: 2.50 cm MITRAL VALVE               TRICUSPID VALVE MV Area (PHT): 2.76 cm    TR Peak grad:   40.4 mmHg MV Decel Time: 275 msec    TR Vmax:        318.00 cm/s MV E velocity: 70.40 cm/s MV A velocity: 85.90 cm/s  SHUNTS MV E/A ratio:  0.82        Systemic Diam: 1.70 cm Zoila Shutter MD Electronically signed by Zoila Shutter MD Signature Date/Time: 08/15/2020/5:30:22 PM    Final     Assessment/Plan:   Active Problems:   Hypokalemia due to inadequate potassium intake   Patient Summary: Beverly Howell is a 80 y.o. with a pertinent PMH of  HTN,  HLD,CKD stage III,and recent COVID infection with CVA with residual difficulty swallowing, multifocal necrotizing pneumonia,right-sided hydropneumothorax,MSSA empyemas/pinsertion of chesttubes, who presented with a 1 month history of generalized weakness and developedworsening of her bilateral lower extremity weakness that resulted in fall space prior to arrival and admitted for further work-up of generalized weakness and hypokalemia.   Generalized weakness: Hypokalemia: Patient continues to endorse weakness and states that her lower extremity weakness has worsened.  Neurological exam as per above that was concerning for worsening weakness as well as worsening of swallowing.  Patient also appeared to have difficulty with movement, concerning for cerebellar infarct.  Her potassium has improved significantly to 3.7.  Because of this worsening of the weakness and MRI of the brain without contrast was performed.  This showed a new 3 mm subacute right centrum semiovale infarct.  Slightly larger subacute infarct involving the right corona radiata.  There was also found to be prominent right corona radiata and occipital region flow-voids, suspicious for vascular malformation/development venous anomaly. With history of suspected CVA due to emboli (uncertain if from Covid infection or bilateral lower extremity DVTs), suspect emboli may be the etiology for new infarcts.  Consider bubble study.  Patient will need full stroke work-up and neurology consult. In reviewing patient's medical records it does appear that she has been inconsistently taking her Eliquis and that she has not also not been on high-dose intensity statin.  Both of these increase her risk for stroke. -Neurology consult.  Appreciate their recommendations -Lipid panel, A1c ordered  -Echocardiogram ordered. Consider bubble study. -Carotid Dopplers -Eliquis continued -PT OT consult -Frequent neurochecks  Dysphagia: Evaluated by SLP  today.  Patient with difficulty swallowing described globus sensation.  Modified barium swallow revealed normal oropharyngeal swallow function.  No penetration or aspiration with any consistencies trialed.  Despite these, patient still with globus sensation.  Her significant weight loss is thought to be due to decrease in diet which is thought to be secondary to her dysphagia.  While new stroke may be contributing to her worsening dysphagia, will consider GI consult while admitted and otherwise will recommend outpatient GI follow-up. Nutrition consulted recommended Ensure p.o. 3 times daily MVI with minerals daily Magic cup 3 times daily with meals.  -Consider inpatient GI consult. Otherwise plan to follow-up on outpatient basis -Nutrition consult  AKI: Creatinine appears to be at baseline from 1.22 > 0.93 -Daily BMPs  History of Covid: History of multifocal necrotizing pneumonia: History of right lower lung empyema and pneumothorax status post chest tube placement: Patient with no worsening shortness of breath.  Plan to continue antibiotics and have her follow-up in outpatient basis. -Continue home Augmentin -Follow-up with cardiothoracic surgery and infectious disease outpatient  History of bilateral lower extremity DVT: We will continue anticoagulation at this time.  Uncertain if she has consistently been taking Eliquis since diagnosis of DVT.  Will consider repeat venous Doppler of lower extremities.  If bubble study is performed, and positive for PFO, can consider referral to vascular surgery. -Continue Eliquis 5 mg twice daily for now -Consider repeating lower extremity venous duplex.  Hypertension: Patient is on metoprolol tartrate 100 mg BID and lisinopril 10 mg daily at home. BP have been soft while here. Appears volume depleted on exam.  -Hold home BP medications for now -We will continue to monitor closely in context of recent stroke  Hyperlipidemia: Patient on lovastatin at  home.  Will start on high intensity statin. -Resume home lovastatin 40 mg daily  Diet: Regular IVF: None,None VTE: Eliquis Code: Full PT/OT recs: Pending TOC recs: None at this time Family Update: Daughter [name] updated of patient's new stroke.  Dispo: Anticipated discharge in 3 days pending further work-up of her acute CVA.   Thalia Bloodgood DO Internal Medicine Resident PGY-1 Pager [phone] Please contact the on call pager after 5 pm and on weekends at [phone].

## 2020-08-15 NOTE — Progress Notes (Addendum)
  Date: 08/15/2020  Patient name: Beverly Howell  Medical record number: [id number]  Date of birth: 1940-08-19   I have seen and evaluated Beverly Howell and discussed their care with the Residency Team. Briefly, Beverly Howell is a 80 year old woman with PMH of COVID, bilateral DVT and stroke.  Over the last few days-weeks she has been having increased fatigue, weakness.  She has lost about 40 pounds due to progressing dysphagia.  She presented to the hospital due to a fall, possible syncope, though symptoms are more consistent with being severely weak and debilitated.  When we saw her, her right sided weakness was worse than previously documented and her receptive language seemed to be a bit worse as well.  She was supposed to be an a NOAC, and she reports possibly getting samples from her physician.  She notes taking it every day.   Vitals:   08/15/20 0532 08/15/20 1439  BP: 119/60 112/70  Pulse: 63 75  Resp: 16 16  Temp: 97.7 F (36.5 C) 97.8 F (36.6 C)  SpO2: 98% 100%   Gen: Sitting in chair, no distress Eyes: Anicteric sclerae, EOMI HENT: Neck supple, MMM CV: RR, NR, no murmur Pulm: Breathing comfortably, mild crackles at bases Abd: Soft, +BS MSK: Normal tone and bulk, no contracture Neuro: EOMI, speech is fluent, sensation intact to light touch and warmth throughout.  She has 4/5 strength in the RUE and 3/5 strength in the RLE.  She has 4+/5 on the left side.  She has some dysmetria to FTN and heel to shin.   MRI done today shows new stroke.  Neurology consulted.   Assessment and Plan: I have seen and evaluated the patient as outlined above. I agree with the formulated Assessment and Plan as detailed in the residents' note, with the following changes:   1. Acute CVA, likely embolic - Neurology consult - Lipid panel and A1C - TTE - Dopplers - Eliquis - PT/OT, neuro checks - Telemetry  2. Dysphagia - Possibly related to above, SLP recommended GI consult - Continue to monitor for  choking  Other issues per Dr. Evie Lacks' note.   Beverly Catalina, MD 3/4/20227:39 PM

## 2020-08-15 NOTE — Progress Notes (Signed)
  Echocardiogram 2D Echocardiogram has been performed.  Delcie Roch 08/15/2020, 5:20 PM

## 2020-08-15 NOTE — Consult Note (Signed)
Referring Physician: Dr. Evie Lacks    Chief Complaint: Generalized weakness  HPI: Deeya Richeson is an 80 y.o. 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  Patient reports that she did fall onto there ground against her walker, but did not lose consciousness. Patient does not report having a headache prior to the episode, but does report having a headache after her MRI. On exam today patient reports that she feels at her baseline.   LSN: Unknown tPA Given: No: Outside window  Past Medical History:  Diagnosis Date  . High cholesterol   . Hypertension     Past Surgical History:  Procedure Laterality Date  . HAND SURGERY      Family History  Problem Relation Age of Onset  . Stroke Mother    Social History:  reports that she has quit smoking. She has never used smokeless tobacco. She reports that she does not drink alcohol and does not use drugs.  Allergies: No Known Allergies  Medications:  Scheduled: . amoxicillin-clavulanate  1 tablet Oral BID  . apixaban  5 mg Oral BID  . atorvastatin  40 mg Oral Daily  . diltiazem  240 mg Oral Daily  . feeding supplement  237 mL Oral TID BM  . multivitamin with minerals  1 tablet Oral Daily  . potassium chloride  40 mEq Oral Daily    ROS: HEENT: No headache, no change in vision, no sore throat CV: Neg chest pain, palpations Resp: Neg for SOB GI: Neg constipation, nausea, vomiting GU: Neg dysuria Psych: Neg for agitation MSK: Pos for swollen patellar joints and pain in the R knee  Physical Examination: Blood pressure 112/70, pulse 75, temperature 97.8 F (36.6 C), temperature source Oral, resp. rate 16,  weight 68.1 kg, SpO2 100 %.  Neurologic Examination: General: NAD Mental Status: Alert, oriented to place not year, thought content appropriate.  Speech fluent without evidence of aphasia.   Cranial Nerves: II:  Visual fields grossly normal, pupils equal, round, reactive to light and accommodation III,IV, VI: ptosis not present, extra-ocular motions intact bilaterally V,VII: smile symmetric, facial light touch sensation normal bilaterally VIII: hearing normal bilaterally IX,X: uvula rises symmetrically XI: bilateral shoulder shrug XII: midline tongue extension without atrophy or fasciculations  Motor: Right : Upper extremity   4/5    Left:     Upper extremity   4/5  Lower extremity   5/5     Lower extremity   5/5 Tone and bulk:normal tone throughout; no atrophy noted Sensory:  light touch intact throughout, bilaterally Deep Tendon Reflexes:  Right: Upper Extremity   Left: Upper extremity   biceps (C-5 to C-6) 2/4   biceps (C-5 to C-6) 2/4 tricep (C7) 2/4    triceps (C7) 2/4 Brachioradialis (C6) 2/4  Brachioradialis (C6) 2/4  Lower Extremity Lower Extremity  quadriceps (L-2 to L-4) 2/4   quadriceps (L-2 to L-4) 2/4 Achilles (S1) 2/4   Achilles (S1) 2/4  Plantars: Right: downgoing   Left: downgoing Cerebellar: normal finger-to-nose Gait: walks well with walker    Results for orders placed or performed during the hospital encounter of 08/14/20 (from the past 48 hour(s))  Basic metabolic panel     Status: Abnormal  Collection Time: 08/14/20  8:56 AM  Result Value Ref Range   Sodium 135 135 - 145 mmol/L   Potassium 2.7 (LL) 3.5 - 5.1 mmol/L    Comment: CRITICAL RESULT CALLED TO, READ BACK BY AND VERIFIED WITH: BANKS,A RN @1112  ON [id number] BY FLEMINGS    Chloride 89 (L) 98 - 111 mmol/L   CO2 26 22 - 32 mmol/L   Glucose, Bld 119 (H) 70 - 99 mg/dL    Comment: Glucose reference range applies only to samples taken after fasting for at least 8 hours.   BUN 9 8 - 23 mg/dL    Creatinine, Ser 4.781.22 (H) 0.44 - 1.00 mg/dL   Calcium 9.2 8.9 - 29.510.3 mg/dL   GFR, Estimated 45 (L) >60 mL/min    Comment: (NOTE) Calculated using the CKD-EPI Creatinine Equation (2021)    Anion gap 20 (H) 5 - 15    Comment: Performed at [facility], [address] [place], [place] [address]  CBC     Status: Abnormal   Collection Time: 08/14/20  8:56 AM  Result Value Ref Range   WBC 8.1 4.0 - 10.5 K/uL   RBC 4.05 3.87 - 5.11 MIL/uL   Hemoglobin 11.4 (L) 12.0 - 15.0 g/dL   HCT 86.535.9 (L) 78.436.0 - 69.646.0 %   MCV 88.6 80.0 - 100.0 fL   MCH 28.1 26.0 - 34.0 pg   MCHC 31.8 30.0 - 36.0 g/dL   RDW 29.515.6 (H) 28.411.5 - 13.215.5 %   Platelets 370 150 - 400 K/uL   nRBC 0.0 0.0 - 0.2 %    Comment: Performed at [facility], [address] [place], [place] [address]  Troponin I (High Sensitivity)     Status: Abnormal   Collection Time: 08/14/20  8:56 AM  Result Value Ref Range   Troponin I (High Sensitivity) 21 (H) <18 ng/L    Comment: (NOTE) Elevated high sensitivity troponin I (hsTnI) values and significant  changes across serial measurements may suggest ACS but many other  chronic and acute conditions are known to elevate hsTnI results.  Refer to the Links section for chest pain algorithms and additional  guidance. Performed at [facility], [address] [place], [place] [address]   Hepatic function panel     Status: Abnormal   Collection Time: 08/14/20  8:56 AM  Result Value Ref Range   Total Protein 6.3 (L) 6.5 - 8.1 g/dL   Albumin 3.1 (L) 3.5 - 5.0 g/dL   AST 18 15 - 41 U/L   ALT 12 0 - 44 U/L   Alkaline Phosphatase 45 38 - 126 U/L   Total Bilirubin 2.0 (H) 0.3 - 1.2 mg/dL   Bilirubin, Direct 0.2 0.0 - 0.2 mg/dL   Indirect Bilirubin 1.8 (H) 0.3 - 0.9 mg/dL    Comment: Performed at [facility], [address] [place], [place] [address]  Magnesium     Status: None   Collection Time: 08/14/20  8:56 AM  Result Value Ref Range   Magnesium 1.7 1.7 - 2.4 mg/dL     Comment: Performed at [facility], [address] [place], [place] [address]  Troponin I (High Sensitivity)     Status: Abnormal   Collection Time: 08/14/20  1:19 PM  Result Value Ref Range   Troponin I (High Sensitivity) 20 (H) <18 ng/L    Comment: (NOTE) Elevated high sensitivity troponin I (hsTnI) values and significant  changes across  serial measurements may suggest ACS but many other  chronic and acute conditions are known to elevate hsTnI results.  Refer to the "Links" section for chest pain algorithms and additional  guidance. Performed at [facility], [address], [place], [place] [address]   SARS CORONAVIRUS 2 (TAT 6-24 HRS) Nasopharyngeal Nasopharyngeal Swab     Status: None   Collection Time: 08/14/20  3:05 PM   Specimen: Nasopharyngeal Swab  Result Value Ref Range   SARS Coronavirus 2 NEGATIVE NEGATIVE    Comment: (NOTE) SARS-CoV-2 target nucleic acids are NOT DETECTED.  The SARS-CoV-2 RNA is generally detectable in upper and lower respiratory specimens during the acute phase of infection. Negative results do not preclude SARS-CoV-2 infection, do not rule out co-infections with other pathogens, and should not be used as the sole basis for treatment or other patient management decisions. Negative results must be combined with clinical observations, patient history, and epidemiological information. The expected result is Negative.  Fact Sheet for Patients: HairSlick.no  Fact Sheet for Healthcare Providers: quierodirigir.com  This test is not yet approved or cleared by the [place] FDA and  has been authorized for detection and/or diagnosis of SARS-CoV-2 by FDA under an Emergency Use Authorization (EUA). This EUA will remain  in effect (meaning this test can be used) for the duration of the COVID-19 declaration under Se ction 564(b)(1) of the Act, 21 U.S.C. section 360bbb-3(b)(1), unless the  authorization is terminated or revoked sooner.  Performed at [facility], [address] [place], [place] [address]   Basic metabolic panel     Status: Abnormal   Collection Time: 08/14/20  5:16 PM  Result Value Ref Range   Sodium 136 135 - 145 mmol/L   Potassium 3.4 (L) 3.5 - 5.1 mmol/L    Comment: NO VISIBLE HEMOLYSIS   Chloride 91 (L) 98 - 111 mmol/L   CO2 27 22 - 32 mmol/L   Glucose, Bld 79 70 - 99 mg/dL    Comment: Glucose reference range applies only to samples taken after fasting for at least 8 hours.   BUN 11 8 - 23 mg/dL   Creatinine, Ser 7.84 (H) 0.44 - 1.00 mg/dL   Calcium 9.1 8.9 - 69.6 mg/dL   GFR, Estimated 49 (L) >60 mL/min    Comment: (NOTE) Calculated using the CKD-EPI Creatinine Equation (2021)    Anion gap 18 (H) 5 - 15    Comment: Performed at [facility], [address], [place], [place] [address]  CBC     Status: Abnormal   Collection Time: 08/15/20  4:23 AM  Result Value Ref Range   WBC 6.5 4.0 - 10.5 K/uL   RBC 3.48 (L) 3.87 - 5.11 MIL/uL   Hemoglobin 10.1 (L) 12.0 - 15.0 g/dL   HCT 41.3 (L) 24.4 - 01.0 %   MCV 86.5 80.0 - 100.0 fL   MCH 29.0 26.0 - 34.0 pg   MCHC 33.6 30.0 - 36.0 g/dL   RDW 27.2 (H) 53.6 - 64.4 %   Platelets 350 150 - 400 K/uL   nRBC 0.0 0.0 - 0.2 %    Comment: Performed at [facility], [address] [place], [place] [address]  Basic metabolic panel     Status: Abnormal   Collection Time: 08/15/20  4:23 AM  Result Value Ref Range   Sodium 139 135 - 145 mmol/L   Potassium 3.7 3.5 - 5.1 mmol/L   Chloride 96 (L)  98 - 111 mmol/L   CO2 31 22 - 32 mmol/L   Glucose, Bld 71 70 - 99 mg/dL    Comment: Glucose reference range applies only to samples taken after fasting for at least 8 hours.   BUN 6 (L) 8 - 23 mg/dL   Creatinine, Ser 1.61 0.44 - 1.00 mg/dL   Calcium 8.9 8.9 - 09.6 mg/dL   GFR, Estimated >04 >54 mL/min    Comment: (NOTE) Calculated using the CKD-EPI Creatinine Equation (2021)    Anion gap 12 5  - 15    Comment: Performed at [facility], [address], [place], [place] [address]  Magnesium     Status: None   Collection Time: 08/15/20  4:23 AM  Result Value Ref Range   Magnesium 2.0 1.7 - 2.4 mg/dL    Comment: Performed at [facility], [address], [place], [place] [address]  Glucose, capillary     Status: Abnormal   Collection Time: 08/15/20  7:50 AM  Result Value Ref Range   Glucose-Capillary 61 (L) 70 - 99 mg/dL    Comment: Glucose reference range applies only to samples taken after fasting for at least 8 hours.  Glucose, capillary     Status: None   Collection Time: 08/15/20  8:34 AM  Result Value Ref Range   Glucose-Capillary 99 70 - 99 mg/dL    Comment: Glucose reference range applies only to samples taken after fasting for at least 8 hours.  Urinalysis, Routine w reflex microscopic Urine, Clean Catch     Status: Abnormal   Collection Time: 08/15/20 10:45 AM  Result Value Ref Range   Color, Urine YELLOW YELLOW   APPearance CLEAR CLEAR   Specific Gravity, Urine 1.010 1.005 - 1.030   pH 6.0 5.0 - 8.0   Glucose, UA 50 (A) NEGATIVE mg/dL   Hgb urine dipstick NEGATIVE NEGATIVE   Bilirubin Urine NEGATIVE NEGATIVE   Ketones, ur 20 (A) NEGATIVE mg/dL   Protein, ur NEGATIVE NEGATIVE mg/dL   Nitrite NEGATIVE NEGATIVE   Leukocytes,Ua TRACE (A) NEGATIVE   RBC / HPF 0-5 0 - 5 RBC/hpf   WBC, UA 0-5 0 - 5 WBC/hpf   Bacteria, UA RARE (A) NONE SEEN   Squamous Epithelial / LPF 0-5 0 - 5   Mucus PRESENT    Non Squamous Epithelial 0-5 (A) NONE SEEN    Comment: Performed at [facility], [address], [place], [place] [address]   DG Chest 2 View  Result Date: 08/14/2020 CLINICAL DATA:  Syncope, weakness. EXAM: CHEST - 2 VIEW COMPARISON:  June 19, 2020.  May 19, 2020. FINDINGS: The heart size and mediastinal contours are within normal limits. Small loculated right pleural effusion is noted. Minimal left basilar subsegmental atelectasis is noted. Margin  of large air collection is again noted medially in the right lung suggesting sequela of previous cavitating pneumonia; it does not appear to be as thick walled as noted previously. Left lung is clear. The visualized skeletal structures are unremarkable. IMPRESSION: Stable small loculated right pleural effusion. Continued presence of large air collection seen in right lung suggesting sequela of previous cavitating pneumonia; it does not appear to be as thick walled as noted previously. Electronically Signed   By: Lupita Raider M.D.   On: 08/14/2020 09:28   MR BRAIN WO CONTRAST  Result Date: 08/15/2020 CLINICAL DATA:  weakness EXAM: MRI HEAD WITHOUT CONTRAST TECHNIQUE: Multiplanar, multiecho pulse sequences of the brain and  surrounding structures were obtained without intravenous contrast. COMPARISON:  Concurrent MRV head.  04/22/2020 and prior. FINDINGS: Brain: 3 mm right centrum semiovale DWI hyperintensity. Slightly larger DWI hyperintensity involving the right corona radiata. Sequela of small remote right MCA territory insults, better demonstrated on prior exam. Small remote right parietal hemorrhages. No midline shift, ventriculomegaly or extra-axial fluid collection. No mass lesion. Chronic left cerebellar insult. Mild chronic microvascular ischemic changes. Vascular: Major intracranial flow voids are preserved at the skull base. Prominent flow voids on SWI involving the right corona radiata and occipital region. Skull and upper cervical spine: Normal marrow signal. Sinuses/Orbits: Normal orbits. Pneumatized paranasal sinuses. Trace mastoid free fluid. Other: None. IMPRESSION: 3 mm subacute right centrum semiovale infarct. Slightly larger subacute infarct involving the right corona radiata. 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 Consider postcontrast MRI for better characterization. Multifocal remote chronic insults  as detailed above. Mild chronic microvascular ischemic changes. These results will be called to the ordering clinician or representative by the Radiologist Assistant, and communication documented in the PACS or [employer]. Electronically Signed   By: Stana Bunting M.D.   On: 08/15/2020 13:02   DG Swallowing Func-Speech Pathology  Result Date: 08/15/2020 Objective Swallowing Evaluation: Type of Study: MBS-Modified Barium Swallow Study  Patient Details Name: Kalin Amrhein MRN: [id number] Date of Birth: 1941/01/01 Today's Date: 08/15/2020 Time: SLP Start Time (ACUTE ONLY): 1043 -SLP Stop Time (ACUTE ONLY): 1054 SLP Time Calculation (min) (ACUTE ONLY): 11 min Past Medical History: Past Medical History: Diagnosis Date . High cholesterol  . Hypertension  Past Surgical History: Past Surgical History: Procedure Laterality Date . HAND SURGERY   HPI: This is 80 year old female with a history of HTN, HLD, CKD stage III, and recent COVID infection with 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 Pt reports swallowing difficulty since November, but not immediately following stroke.  Pt was not seen by ST during admission for CVA, presumably because of no dysphagia at that time.  Subjective: Pt awake, alert, pleasant, participative Assessment / Plan / Recommendation CHL IP CLINICAL IMPRESSIONS 08/15/2020 Clinical Impression Pt presents with normal oropharyngeal swallow function.  There was no penetration or aspiration of any consistencies trialed.  There was no pharyngeal residue. Swallow was intermittently delayed to vallecula or pyriform sinuses with thin liquids, but remains WFL, especially considering pt's age.  There was stasis of contrast noted in upper esophagus with puree and solid textures.  Pt stated that she felt something was stuck or she would have to  bring something up on several occaision and bolus trials were paused to allow this sensation to pass.  With pill simulation there was initially difficulty with oral transit at pill was expectorated, but pt was able to clear pill on second attempt when self-administered.  On esophageal sweep, tablet could not be located, but there was contrast material remaining in esophagus. Of note, there was mild narrowing of the pharynx, which seems to be related to cervical osteophytes which did not impair swallow function.  Recommend regular texture diet with thin liquid. Consider GI consult as pt's dysphagia symptoms are impacting her level of function and seem to be causing her distress. SLP Visit Diagnosis Dysphagia, unspecified (R13.10) Attention and concentration deficit following -- Frontal lobe and executive function deficit following -- Impact on safety and function No limitations  CHL IP TREATMENT RECOMMENDATION 08/15/2020 Treatment Recommendations No treatment recommended at this time   Prognosis 08/15/2020 Prognosis for Safe Diet Advancement (No Data) Barriers to Reach Goals -- Barriers/Prognosis Comment -- CHL IP DIET RECOMMENDATION 08/15/2020 SLP Diet Recommendations Regular solids;Thin liquid Liquid Administration via Cup;Straw Medication Administration Whole meds with liquid Compensations Slow rate;Small sips/bites Postural Changes Seated upright at 90 degrees;Remain semi-upright after after feeds/meals (Comment)   CHL IP OTHER RECOMMENDATIONS 08/15/2020 Recommended Consults Consider GI evaluation;Consider esophageal assessment Oral Care Recommendations Oral care BID Other Recommendations --   CHL IP FOLLOW UP RECOMMENDATIONS 08/15/2020 Follow up Recommendations None   CHL IP FREQUENCY AND DURATION 08/15/2020 Speech Therapy Frequency (ACUTE ONLY) (No Data) Treatment Duration --      CHL IP ORAL PHASE 08/15/2020 Oral Phase Impaired Oral - Pudding Teaspoon -- Oral - Pudding Cup -- Oral - Honey Teaspoon -- Oral - Honey Cup --  Oral - Nectar Teaspoon -- Oral - Nectar Cup -- Oral - Nectar Straw -- Oral - Thin Teaspoon -- Oral - Thin Cup WFL Oral - Thin Straw WFL Oral - Puree WFL Oral - Mech Soft -- Oral - Regular Piecemeal swallowing Oral - Multi-Consistency -- Oral - Pill Reduced posterior propulsion Oral Phase - Comment --  CHL IP PHARYNGEAL PHASE 08/15/2020 Pharyngeal Phase Impaired Pharyngeal- Pudding Teaspoon -- Pharyngeal -- Pharyngeal- Pudding Cup -- Pharyngeal -- Pharyngeal- Honey Teaspoon -- Pharyngeal -- Pharyngeal- Honey Cup -- Pharyngeal -- Pharyngeal- Nectar Teaspoon -- Pharyngeal -- Pharyngeal- Nectar Cup -- Pharyngeal -- Pharyngeal- Nectar Straw -- Pharyngeal -- Pharyngeal- Thin Teaspoon -- Pharyngeal -- Pharyngeal- Thin Cup Delayed swallow initiation-vallecula;Delayed swallow initiation-pyriform sinuses Pharyngeal Material does not enter airway Pharyngeal- Thin Straw Delayed swallow initiation-vallecula;Delayed swallow initiation-pyriform sinuses Pharyngeal Material does not enter airway Pharyngeal- Puree WFL Pharyngeal Material does not enter airway Pharyngeal- Mechanical Soft -- Pharyngeal -- Pharyngeal- Regular WFL Pharyngeal Material does not enter airway Pharyngeal- Multi-consistency -- Pharyngeal -- Pharyngeal- Pill WFL Pharyngeal Material does not enter airway Pharyngeal Comment --  CHL IP CERVICAL ESOPHAGEAL PHASE 08/15/2020 Cervical Esophageal Phase Impaired Pudding Teaspoon -- Pudding Cup -- Honey Teaspoon -- Honey Cup -- Nectar Teaspoon -- Nectar Cup -- Nectar Straw -- Thin Teaspoon -- Thin Cup WFL Thin Straw WFL Puree (No Data) Mechanical Soft -- Regular (No Data) Multi-consistency -- Pill WFL Cervical Esophageal Comment -- Kerrie Pleasure, MA, CCC-SLP Acute Rehabilitation Services Office: [phone] 08/15/2020, 11:43 AM              MR MRV HEAD WO CM  Result Date: 08/15/2020 CLINICAL DATA:  Weakness of both lower extremities. EXAM: MR VENOGRAM OF THE HEAD WITHOUT CONTRAST TECHNIQUE: Angiographic images of the  intracranial venous structures were obtained using MRV technique without intravenous contrast. COMPARISON:  04/22/2020 FINDINGS: Superior sagittal sinus is patent and normal. Both transverse sinuses are patent, with the left being larger than the right. Sigmoid sinuses and jugular veins are patent. Deep veins are patent. No visible superficial thrombosis. IMPRESSION: Normal intracranial venography. Electronically Signed   By: Paulina Fusi M.D.   On: 08/15/2020 11:40    Assessment: 80 y.o. 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 Patient MRI was concerning for new CVA. Patient presentation appears to be at her baseline with difficulty swallowing a residual from a prior CVA. Patient has been seen by Speech Pathology and received Barium Swallow  which suggest patient have regular solids and thin liquid diet. Patient has been seen by OT who do not suggest f/u. PT was evaluating patient will leave their recommendations. Patient is on Eliquis and being treated for HTN and HLD. Recommend to continue with these treatments and have patient f/u with her neurologist OP.  Stroke Risk Factors - Previous stroke, HLD, HTN,   Plan: 1. HgbA1c, fasting lipid panel 3. PT consult 4. Echocardiogram 5. Carotid dopplers 6. Prophylactic therapy- Eliquis  (home medication) 7. Risk factor modification 8. Telemetry monitoring 9. Frequent neuro checks   Eliseo Gum, MD PGY-1 08/15/2020, 3:44 PM

## 2020-08-15 NOTE — Progress Notes (Signed)
Modified Barium Swallow Progress Note  Patient Details  Name: Beverly Howell MRN: [id number] Date of Birth: 04/21/1941  Today's Date: 08/15/2020  Modified Barium Swallow completed.  Full report located under Chart Review in the Imaging Section.  Brief recommendations include the following:  Clinical Impression  Pt presents with normal oropharyngeal swallow function.  There was no penetration or aspiration of any consistencies trialed.  There was no pharyngeal residue. Swallow was intermittently delayed to vallecula or pyriform sinuses with thin liquids, but remains WFL, especially considering pt's age.  There was stasis of contrast noted in upper esophagus with puree and solid textures.  Pt stated that she felt something was stuck or she would have to bring something up on several occaision and bolus trials were paused to allow this sensation to pass.  With pill simulation there was initially difficulty with oral transit at pill was expectorated, but pt was able to clear pill on second attempt when self-administered.  On esophageal sweep, tablet could not be located, but there was contrast material remaining in esophagus. Of note, there was mild narrowing of the pharynx, which seems to be related to cervical osteophytes which did not impair swallow function.    Recommend regular texture diet with thin liquid. Consider GI consult as pt's dysphagia symptoms are impacting her level of function and seem to be causing her distress.    Pt has no further ST needs.  SLP will sign off at this time.   Swallow Evaluation Recommendations   Recommended Consults: Consider GI evaluation;Consider esophageal assessment   SLP Diet Recommendations: Regular solids;Thin liquid   Liquid Administration via: Cup;Straw   Medication Administration: Whole meds with liquid   Supervision: Patient able to self feed   Compensations: Slow rate;Small sips/bites   Postural Changes: Seated upright at 90 degrees;Remain  semi-upright after after feeds/meals (Comment)   Oral Care Recommendations: Oral care BID        Kerrie Pleasure, MA, CCC-SLP Acute Rehabilitation Services Office: [phone] 08/15/2020,11:40 AM
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
## 2020-08-16 DIAGNOSIS — E876 Hypokalemia: Principal | ICD-10-CM

## 2020-08-16 DIAGNOSIS — N179 Acute kidney failure, unspecified: Secondary | ICD-10-CM

## 2020-08-16 DIAGNOSIS — R531 Weakness: Secondary | ICD-10-CM | POA: Diagnosis not present

## 2020-08-16 DIAGNOSIS — R1310 Dysphagia, unspecified: Secondary | ICD-10-CM

## 2020-08-16 DIAGNOSIS — R55 Syncope and collapse: Secondary | ICD-10-CM | POA: Diagnosis not present

## 2020-08-16 DIAGNOSIS — I951 Orthostatic hypotension: Secondary | ICD-10-CM | POA: Diagnosis not present

## 2020-08-16 DIAGNOSIS — Z8673 Personal history of transient ischemic attack (TIA), and cerebral infarction without residual deficits: Secondary | ICD-10-CM | POA: Diagnosis not present

## 2020-08-16 DIAGNOSIS — I82403 Acute embolism and thrombosis of unspecified deep veins of lower extremity, bilateral: Secondary | ICD-10-CM | POA: Diagnosis not present

## 2020-08-16 DIAGNOSIS — Z8616 Personal history of COVID-19: Secondary | ICD-10-CM

## 2020-08-16 DIAGNOSIS — I639 Cerebral infarction, unspecified: Secondary | ICD-10-CM | POA: Diagnosis not present

## 2020-08-16 DIAGNOSIS — I10 Essential (primary) hypertension: Secondary | ICD-10-CM

## 2020-08-16 DIAGNOSIS — I6310 Cerebral infarction due to embolism of unspecified precerebral artery: Secondary | ICD-10-CM | POA: Diagnosis not present

## 2020-08-16 DIAGNOSIS — E785 Hyperlipidemia, unspecified: Secondary | ICD-10-CM

## 2020-08-16 LAB — CBC
HCT: 32.8 % — ABNORMAL LOW (ref 36.0–46.0)
Hemoglobin: 10.8 g/dL — ABNORMAL LOW (ref 12.0–15.0)
MCH: 28.8 pg (ref 26.0–34.0)
MCHC: 32.9 g/dL (ref 30.0–36.0)
MCV: 87.5 fL (ref 80.0–100.0)
Platelets: 386 10*3/uL (ref 150–400)
RBC: 3.75 MIL/uL — ABNORMAL LOW (ref 3.87–5.11)
RDW: 16.4 % — ABNORMAL HIGH (ref 11.5–15.5)
WBC: 7.1 10*3/uL (ref 4.0–10.5)
nRBC: 0 % (ref 0.0–0.2)

## 2020-08-16 LAB — BASIC METABOLIC PANEL
Anion gap: 11 (ref 5–15)
BUN: 6 mg/dL — ABNORMAL LOW (ref 8–23)
CO2: 31 mmol/L (ref 22–32)
Calcium: 9.3 mg/dL (ref 8.9–10.3)
Chloride: 95 mmol/L — ABNORMAL LOW (ref 98–111)
Creatinine, Ser: 0.9 mg/dL (ref 0.44–1.00)
GFR, Estimated: >60 mL/min (ref >60)
Glucose, Bld: 114 mg/dL — ABNORMAL HIGH (ref 70–99)
Potassium: 4.2 mmol/L (ref 3.5–5.1)
Sodium: 137 mmol/L (ref 135–145)

## 2020-08-16 LAB — GLUCOSE, CAPILLARY: Glucose-Capillary: 82 mg/dL (ref 70–99)

## 2020-08-16 MED ORDER — LACTATED RINGERS IV BOLUS
500.0000 mL | Freq: Once | INTRAVENOUS | Status: AC
  Administered 2020-08-16: 500 mL via INTRAVENOUS

## 2020-08-16 MED ORDER — DILTIAZEM HCL ER COATED BEADS 240 MG PO CP24
240.0000 mg | ORAL_CAPSULE | Freq: Every day | ORAL | Status: DC
  Administered 2020-08-16: 240 mg via ORAL
  Filled 2020-08-16: qty 1

## 2020-08-16 NOTE — Progress Notes (Signed)
HD#2 Subjective:  Overnight Events: No acute events overnight  Patient doing well overall, sitting up eating breakfast without difficulties.  She endorses significant improvement of generalized weakness.  Patient is concerned that she continues to have strokes.  No other concerns at this time  Objective:  Vital signs in last 24 hours:       Vitals:   08/15/20 0208 08/15/20 0500 08/15/20 0532 08/15/20 1439  BP: 129/70  119/60 112/70  Pulse: 68  63 75  Resp: 16  16 16   Temp: 98.1 F (36.7 C)  97.7 F (36.5 C) 97.8 F (36.6 C)  TempSrc: Oral  Oral Oral  SpO2: 100%  98% 100%  Weight:  68.1 kg     Supplemental O2: Room Air SpO2: 100 %   Physical Exam:  Constitutional: well-appearing, eating breakfast without difficulty swallowing HENT: normocephalic atraumatic Eyes: conjunctiva non-erythematous. Neck: supple Cardiovascular: Irregular rate and rhythm, no m/r/g Pulmonary/Chest: normal work of breathing on room air MSK: normal bulk and tone Neurological: alert & oriented x 3. No facial asymmetry.  Lower extremity strength 4/5 bilaterally. upper extremity strength 5/5.  Skin: warm and dry Psych: Normal mood     Filed Weights   08/15/20 0500  Weight: 68.1 kg     Intake/Output Summary (Last 24 hours) at 08/15/2020 1750 Last data filed at 08/15/2020 1200    Gross per 24 hour  Intake 380 ml  Output 450 ml  Net -70 ml   Net IO Since Admission: -70 mL [08/15/20 1750]  Pertinent Labs: CBC Latest Ref Rng & Units 08/15/2020 08/14/2020 06/06/2020  WBC 4.0 - 10.5 K/uL 6.5 8.1 8.3  Hemoglobin 12.0 - 15.0 g/dL 10.1(L) 11.4(L) 10.2(L)  Hematocrit 36.0 - 46.0 % 30.1(L) 35.9(L) 31.9(L)  Platelets 150 - 400 K/uL 350 370 480(H)    CMP Latest Ref Rng & Units 08/15/2020 08/14/2020 08/14/2020  Glucose 70 - 99 mg/dL 71 79 409(W119(H)  BUN 8 - 23 mg/dL 6(L) 11 9  Creatinine 1.190.44 - 1.00 mg/dL 1.470.93 8.29(F1.13(H) 6.21(H1.22(H)  Sodium 135 - 145 mmol/L 139 136 135  Potassium 3.5  - 5.1 mmol/L 3.7 3.4(L) 2.7(LL)  Chloride 98 - 111 mmol/L 96(L) 91(L) 89(L)  CO2 22 - 32 mmol/L 31 27 26   Calcium 8.9 - 10.3 mg/dL 8.9 9.1 9.2  Total Protein 6.5 - 8.1 g/dL - - 6.3(L)  Total Bilirubin 0.3 - 1.2 mg/dL - - 2.0(H)  Alkaline Phos 38 - 126 U/L - - 45  AST 15 - 41 U/L - - 18  ALT 0 - 44 U/L - - 12    Imaging: MR BRAIN WO CONTRAST  Result Date: 08/15/2020 CLINICAL DATA:  weakness EXAM: MRI HEAD WITHOUT CONTRAST TECHNIQUE: Multiplanar, multiecho pulse sequences of the brain and surrounding structures were obtained without intravenous contrast. COMPARISON:  Concurrent MRV head.  04/22/2020 and prior. FINDINGS: Brain: 3 mm right centrum semiovale DWI hyperintensity. Slightly larger DWI hyperintensity involving the right corona radiata. Sequela of small remote right MCA territory insults, better demonstrated on prior exam. Small remote right parietal hemorrhages. No midline shift, ventriculomegaly or extra-axial fluid collection. No mass lesion. Chronic left cerebellar insult. Mild chronic microvascular ischemic changes. Vascular: Major intracranial flow voids are preserved at the skull base. Prominent flow voids on SWI involving the right corona radiata and occipital region. Skull and upper cervical spine: Normal marrow signal. Sinuses/Orbits: Normal orbits. Pneumatized paranasal sinuses. Trace mastoid free fluid. Other: None. IMPRESSION: 3 mm subacute right centrum semiovale infarct.  Slightly larger subacute infarct involving the right corona radiata. 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 Consider postcontrast MRI for better characterization. Multifocal remote chronic insults as detailed above. Mild chronic microvascular ischemic changes. These results will be called to the ordering clinician or representative by the Radiologist Assistant, and communication documented in the PACS or [employer].  Electronically Signed   By: Stana Bunting M.D.   On: 08/15/2020 13:02   DG Swallowing Func-Speech Pathology  Result Date: 08/15/2020 Objective Swallowing Evaluation: Type of Study: MBS-Modified Barium Swallow Study  [name] MRN: [id number] Date of Birth: Sep 03, 1940 Today's Date: 08/15/2020 Time: SLP Start Time (ACUTE ONLY): 1043 -SLP Stop Time (ACUTE ONLY): 1054 SLP Time Calculation (min) (ACUTE ONLY): 11 min Past Medical History: Past Medical History: Diagnosis Date  High cholesterol   Hypertension  Past Surgical History: Past Surgical History: Procedure Laterality Date  HAND SURGERY   HPI: This is 80 year old female with a history of HTN, HLD, CKD stage III, and recent COVID infection with 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 Pt reports swallowing difficulty since November, but not immediately following stroke.  Pt was not seen by ST during admission for CVA, presumably because of no dysphagia at that time.  Subjective: Pt awake, alert, pleasant, participative Assessment / Plan / Recommendation CHL IP CLINICAL IMPRESSIONS 08/15/2020 Clinical Impression Pt presents with normal oropharyngeal swallow function.  There was no penetration or aspiration of any consistencies trialed.  There was no pharyngeal residue. Swallow was intermittently delayed to vallecula or pyriform sinuses with thin liquids, but remains WFL, especially considering pt's age.  There was stasis of contrast noted in upper esophagus with puree and solid textures.  Pt stated that she felt something was stuck or she would have to bring something up on several occaision and bolus trials were paused to allow this sensation to pass.  With pill simulation there was initially difficulty with oral transit at pill was expectorated, but pt was able to clear pill  on second attempt when self-administered.  On esophageal sweep, tablet could not be located, but there was contrast material remaining in esophagus. Of note, there was mild narrowing of the pharynx, which seems to be related to cervical osteophytes which did not impair swallow function.  Recommend regular texture diet with thin liquid. Consider GI consult as pt's dysphagia symptoms are impacting her level of function and seem to be causing her distress. SLP Visit Diagnosis Dysphagia, unspecified (R13.10) Attention and concentration deficit following -- Frontal lobe and executive function deficit following -- Impact on safety and function No limitations   CHL IP TREATMENT RECOMMENDATION 08/15/2020 Treatment Recommendations No treatment recommended at this time   Prognosis 08/15/2020 Prognosis for Safe Diet Advancement (No Data) Barriers to Reach Goals -- Barriers/Prognosis Comment -- CHL IP DIET RECOMMENDATION 08/15/2020 SLP Diet Recommendations Regular solids;Thin liquid Liquid Administration via Cup;Straw Medication Administration Whole meds with liquid Compensations Slow rate;Small sips/bites Postural Changes Seated upright at 90 degrees;Remain semi-upright after after feeds/meals (Comment)   CHL IP OTHER RECOMMENDATIONS 08/15/2020 Recommended Consults Consider GI evaluation;Consider esophageal assessment Oral Care Recommendations Oral care BID Other Recommendations --   CHL IP FOLLOW UP RECOMMENDATIONS 08/15/2020 Follow up Recommendations None   CHL IP FREQUENCY AND DURATION 08/15/2020 Speech Therapy Frequency (ACUTE ONLY) (No Data) Treatment Duration --  CHL IP ORAL PHASE 08/15/2020 Oral Phase Impaired Oral - Pudding Teaspoon -- Oral - Pudding Cup -- Oral - Honey Teaspoon -- Oral - Honey Cup -- Oral - Nectar Teaspoon -- Oral - Nectar Cup -- Oral - Nectar Straw -- Oral - Thin Teaspoon -- Oral - Thin Cup WFL Oral - Thin Straw WFL Oral - Puree WFL Oral - Mech Soft -- Oral - Regular Piecemeal swallowing Oral -  Multi-Consistency -- Oral - Pill Reduced posterior propulsion Oral Phase - Comment --  CHL IP PHARYNGEAL PHASE 08/15/2020 Pharyngeal Phase Impaired Pharyngeal- Pudding Teaspoon -- Pharyngeal -- Pharyngeal- Pudding Cup -- Pharyngeal -- Pharyngeal- Honey Teaspoon -- Pharyngeal -- Pharyngeal- Honey Cup -- Pharyngeal -- Pharyngeal- Nectar Teaspoon -- Pharyngeal -- Pharyngeal- Nectar Cup -- Pharyngeal -- Pharyngeal- Nectar Straw -- Pharyngeal -- Pharyngeal- Thin Teaspoon -- Pharyngeal -- Pharyngeal- Thin Cup Delayed swallow initiation-vallecula;Delayed swallow initiation-pyriform sinuses Pharyngeal Material does not enter airway Pharyngeal- Thin Straw Delayed swallow initiation-vallecula;Delayed swallow initiation-pyriform sinuses Pharyngeal Material does not enter airway Pharyngeal- Puree WFL Pharyngeal Material does not enter airway Pharyngeal- Mechanical Soft -- Pharyngeal -- Pharyngeal- Regular WFL Pharyngeal Material does not enter airway Pharyngeal- Multi-consistency -- Pharyngeal -- Pharyngeal- Pill WFL Pharyngeal Material does not enter airway Pharyngeal Comment --  CHL IP CERVICAL ESOPHAGEAL PHASE 08/15/2020 Cervical Esophageal Phase Impaired Pudding Teaspoon -- Pudding Cup -- Honey Teaspoon -- Honey Cup -- Nectar Teaspoon -- Nectar Cup -- Nectar Straw -- Thin Teaspoon -- Thin Cup WFL Thin Straw WFL Puree (No Data) Mechanical Soft -- Regular (No Data) Multi-consistency -- Pill WFL Cervical Esophageal Comment -- Kerrie Pleasure, MA, CCC-SLP Acute Rehabilitation Services Office: [phone] 08/15/2020, 11:43 AM              MR MRV HEAD WO CM  Result Date: 08/15/2020 CLINICAL DATA:  Weakness of both lower extremities. EXAM: MR VENOGRAM OF THE HEAD WITHOUT CONTRAST TECHNIQUE: Angiographic images of the intracranial venous structures were obtained using MRV technique without intravenous contrast. COMPARISON:  04/22/2020 FINDINGS: Superior sagittal sinus is patent and normal. Both transverse sinuses are patent, with the  left being larger than the right. Sigmoid sinuses and jugular veins are patent. Deep veins are patent. No visible superficial thrombosis. IMPRESSION: Normal intracranial venography. Electronically Signed   By: Paulina Fusi M.D.   On: 08/15/2020 11:40   ECHOCARDIOGRAM COMPLETE  Result Date: 08/15/2020    ECHOCARDIOGRAM REPORT   Patient Name:   LEVY WELLMAN Date of Exam: 08/15/2020 Medical Rec #:  [id number]     Height:       63.0 in Accession #:    [id number]    Weight:       150.1 lb Date of Birth:  Feb 27, 1941      BSA:          1.712 m Patient Age:    80 years      BP:           112/70 mmHg Patient Gender: F             HR:           83 bpm. Exam Location:  Inpatient Procedure: 2D Echo Indications:    stroke  History:        Patient has prior history of Echocardiogram examinations, most                 recent 04/23/2020. Abnormal ECG, chronic kidney disease; Risk  Factors:Hypertension and Dyslipidemia.  Sonographer:    Delcie Roch Referring Phys: Stefanie.Gip EMILY B MULLEN  Sonographer Comments: Image acquisition challenging due to respiratory motion. IMPRESSIONS  1. Left ventricular ejection fraction, by estimation, is >75%. The left ventricle has hyperdynamic function. The left ventricle has no regional wall motion abnormalities. Left ventricular diastolic parameters are consistent with Grade I diastolic dysfunction (impaired relaxation).  2. Right ventricular systolic function is hyperdynamic. The right ventricular size is normal. There is mildly elevated pulmonary artery systolic pressure. The estimated right ventricular systolic pressure is 43.4 mmHg.  3. The mitral valve was not well visualized. Trivial mitral valve regurgitation.  4. The aortic valve was not well visualized. There is moderate calcification of the aortic valve. Aortic valve regurgitation is not visualized. Mild to moderate aortic valve sclerosis/calcification is present, without any evidence of aortic stenosis.  5. The  inferior vena cava is normal in size with greater than 50% respiratory variability, suggesting right atrial pressure of 3 mmHg. Comparison(s): Changes from prior study are noted. 04/23/2020: LVEF 60-65%. FINDINGS  Left Ventricle: Left ventricular ejection fraction, by estimation, is >75%. The left ventricle has hyperdynamic function. The left ventricle has no regional wall motion abnormalities. The left ventricular internal cavity size was small. There is no left  ventricular hypertrophy. Left ventricular diastolic parameters are consistent with Grade I diastolic dysfunction (impaired relaxation). Indeterminate filling pressures. Right Ventricle: The right ventricular size is normal. No increase in right ventricular wall thickness. Right ventricular systolic function is hyperdynamic. There is mildly elevated pulmonary artery systolic pressure. The tricuspid regurgitant velocity is 3.18 m/s, and with an assumed right atrial pressure of 3 mmHg, the estimated right ventricular systolic pressure is 43.4 mmHg. Left Atrium: Left atrial size was normal in size. Right Atrium: Right atrial size was normal in size. Pericardium: There is no evidence of pericardial effusion. Mitral Valve: The mitral valve was not well visualized. Trivial mitral valve regurgitation. Tricuspid Valve: The tricuspid valve is grossly normal. Tricuspid valve regurgitation is mild. Aortic Valve: The aortic valve was not well visualized. There is moderate calcification of the aortic valve. Aortic valve regurgitation is not visualized. Mild to moderate aortic valve sclerosis/calcification is present, without any evidence of aortic stenosis. Pulmonic Valve: The pulmonic valve was normal in structure. Pulmonic valve regurgitation is not visualized. Aorta: The aortic arch was not well visualized. Venous: The inferior vena cava is normal in size with greater than 50% respiratory variability, suggesting right atrial pressure of 3 mmHg. IAS/Shunts: No atrial  level shunt detected by color flow Doppler.  LEFT VENTRICLE PLAX 2D LVIDd:         3.60 cm  Diastology LVIDs:         1.70 cm  LV e' medial:    8.49 cm/s LV PW:         1.00 cm  LV E/e' medial:  8.3 LV IVS:        1.00 cm  LV e' lateral:   9.03 cm/s LVOT diam:     1.70 cm  LV E/e' lateral: 7.8 LVOT Area:     2.27 cm  RIGHT VENTRICLE         IVC TAPSE (M-mode): 1.6 cm  IVC diam: 1.50 cm LEFT ATRIUM             Index       RIGHT ATRIUM          Index LA diam:        2.70 cm 1.58  cm/m  RA Area:     8.87 cm LA Vol (A2C):   28.1 ml 16.42 ml/m RA Volume:   16.90 ml 9.87 ml/m LA Vol (A4C):   24.8 ml 14.49 ml/m LA Biplane Vol: 26.7 ml 15.60 ml/m   AORTA Ao Root diam: 2.50 cm MITRAL VALVE               TRICUSPID VALVE MV Area (PHT): 2.76 cm    TR Peak grad:   40.4 mmHg MV Decel Time: 275 msec    TR Vmax:        318.00 cm/s MV E velocity: 70.40 cm/s MV A velocity: 85.90 cm/s  SHUNTS MV E/A ratio:  0.82        Systemic Diam: 1.70 cm Zoila Shutter MD Electronically signed by Zoila Shutter MD Signature Date/Time: 08/15/2020/5:30:22 PM    Final     Assessment/Plan:   Active Problems:   Hypokalemia due to inadequate potassium intake   Patient Summary: Beverly Howell is a 80 y.o. with a pertinent PMH of  HTN, HLD,CKD stage III,and recent COVID infection with CVA with residual difficulty swallowing, multifocal necrotizing pneumonia,right-sided hydropneumothorax,MSSA empyemas/pinsertion of chesttubes, who presented with a 1 month history of generalized weakness and developedworsening of her bilateral lower extremity weakness that resulted in fall space prior to arrival and admitted for further work-up of generalized weakness and hypokalemia.   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  Patient able to ambulate with walker which is her baseline.     Consider bubble study in setting of lower extremity DVTs.  Echocardiogram performed hyperdynamic left ventricle, LVEF of over 75%.  No evidence of embolic etiology. Continue stroke work-up per neurology.  Discussed possibility of performing transcranial Doppler/bubble study with Dr. Roda Shutters.  He he believes that performing these will not change treatment since patient is currently being anticoagulated for her lower extremity DVTs.  Patient with a regular rhythm on exam today, on telemetry showed frequent PACs. -Lipid panel pending, A1c 5.3 -Echocardiogram ordered.  -Carotid Dopplers pending -Eliquis continued -PT recommend home health PT -Frequent neurochecks  Dysphagia,  Evaluated by SLP today.  Patient with difficulty swallowing described globus sensation.  Modified barium swallow revealed normal oropharyngeal swallow function.  No penetration or aspiration with any consistencies trialed.  Despite these, patient still with globus sensation.  Her significant weight loss is thought to be due to decrease in diet which is thought to be secondary to her dysphagia. Nutrition consulted recommended Ensure p.o. 3 times daily MVI with minerals daily Magic cup 3 times daily with meals.  -Follow with GI on outpatient basis -Nutrition consult  AKI: Creatinine at baseline -Daily BMPs  History of Covid: History of multifocal necrotizing pneumonia: History of right lower lung empyema and pneumothorax status post chest tube placement: Patient with no worsening shortness of breath.  Plan to continue antibiotics and have her follow-up in outpatient basis. -Continue home Augmentin -Follow-up with cardiothoracic surgery and infectious disease outpatient  History of bilateral lower extremity DVT: We will continue anticoagulation at this time.  Uncertain if she has consistently been taking Eliquis since diagnosis of DVT.  Will consider repeat venous Doppler of lower extremities.  If bubble study is performed, and  positive for PFO, can consider referral to vascular surgery. -Continue Eliquis 5 mg twice daily for now -Consider repeating lower extremity venous duplex.  Hypertension: Patient is on metoprolol  tartrate 100 mg BID and lisinopril 10 mg daily at home. BP have been soft while here. Appears volume depleted on exam.  -Hold home BP medications for now -We will continue to monitor closely in context of recent stroke  Hyperlipidemia: Patient on lovastatin at home.  Will start on high intensity statin. -Resume home lovastatin 40 mg daily  Diet: Regular IVF: None,None VTE: Eliquis Code: Full PT/OT recs: Home health TOC recs: None at this time Family Update: Plan to update daughter [place] tomorrow  Dispo: Anticipated discharge in 3 days pending further work-up of her acute CVA.   Thalia Bloodgood DO Internal Medicine Resident PGY-1 Pager [phone] Please contact the on call pager after 5 pm and on weekends at [phone].

## 2020-08-16 NOTE — Progress Notes (Addendum)
STROKE TEAM PROGRESS NOTE   INTERVAL HISTORY MRI reviewed with pt. Besides feeling tired from rehab efforts and "generalized" weakness, she reports no new neurologic symptoms.  Vitals:   08/16/20 0115 08/16/20 0457 08/16/20 0501 08/16/20 1436  BP: 114/63  132/74 (!) 96/51  Pulse: 78  79 73  Resp: 16  16 18   Temp: 98 F (36.7 C)  (!) 97.4 F (36.3 C) (!) 97.4 F (36.3 C)  TempSrc: Oral  Oral   SpO2: 98%  99%   Weight:  68.9 kg     CBC:  Recent Labs  Lab 08/15/20 0423 08/16/20 1059  WBC 6.5 7.1  HGB 10.1* 10.8*  HCT 30.1* 32.8*  MCV 86.5 87.5  PLT 350 386   Basic Metabolic Panel:  Recent Labs  Lab 08/14/20 0856 08/14/20 1716 08/15/20 0423 08/16/20 1059  NA 135   < > 139 137  K 2.7*   < > 3.7 4.2  CL 89*   < > 96* 95*  CO2 26   < > 31 31  GLUCOSE 119*   < > 71 114*  BUN 9   < > 6* 6*  CREATININE 1.22*   < > 0.93 0.90  CALCIUM 9.2   < > 8.9 9.3  MG 1.7  --  2.0  --    < > = values in this interval not displayed.   Lipid Panel: No results for input(s): CHOL, TRIG, HDL, CHOLHDL, VLDL, LDLCALC in the last 168 hours. HgbA1c:  Recent Labs  Lab 08/15/20 0423  HGBA1C 5.3   Urine Drug Screen: No results for input(s): LABOPIA, COCAINSCRNUR, LABBENZ, AMPHETMU, THCU, LABBARB in the last 168 hours.  Alcohol Level No results for input(s): ETH in the last 168 hours.  IMAGING past 24 hours ECHOCARDIOGRAM COMPLETE  Result Date: 08/15/2020    ECHOCARDIOGRAM REPORT   Patient Name:   Beverly Howell Date of Exam: 08/15/2020 Medical Rec #:  10/15/2020     Height:       63.0 in Accession #:    [id number]    Weight:       150.1 lb Date of Birth:  06/02/1941      BSA:          1.712 m Patient Age:    80 years      BP:           112/70 mmHg Patient Gender: F             HR:           83 bpm. Exam Location:  Inpatient Procedure: 2D Echo Indications:    stroke  History:        Patient has prior history of Echocardiogram examinations, most                 recent 04/23/2020. Abnormal ECG,  chronic kidney disease; Risk                 Factors:Hypertension and Dyslipidemia.  Sonographer:    13/03/2020 Referring Phys: Delcie Roch EMILY B MULLEN  Sonographer Comments: Image acquisition challenging due to respiratory motion. IMPRESSIONS  1. Left ventricular ejection fraction, by estimation, is >75%. The left ventricle has hyperdynamic function. The left ventricle has no regional wall motion abnormalities. Left ventricular diastolic parameters are consistent with Grade I diastolic dysfunction (impaired relaxation).  2. Right ventricular systolic function is hyperdynamic. The right ventricular size is normal. There is mildly elevated pulmonary artery systolic pressure. The estimated right ventricular  systolic pressure is 43.4 mmHg.  3. The mitral valve was not well visualized. Trivial mitral valve regurgitation.  4. The aortic valve was not well visualized. There is moderate calcification of the aortic valve. Aortic valve regurgitation is not visualized. Mild to moderate aortic valve sclerosis/calcification is present, without any evidence of aortic stenosis.  5. The inferior vena cava is normal in size with greater than 50% respiratory variability, suggesting right atrial pressure of 3 mmHg. Comparison(s): Changes from prior study are noted. 04/23/2020: LVEF 60-65%. FINDINGS  Left Ventricle: Left ventricular ejection fraction, by estimation, is >75%. The left ventricle has hyperdynamic function. The left ventricle has no regional wall motion abnormalities. The left ventricular internal cavity size was small. There is no left  ventricular hypertrophy. Left ventricular diastolic parameters are consistent with Grade I diastolic dysfunction (impaired relaxation). Indeterminate filling pressures. Right Ventricle: The right ventricular size is normal. No increase in right ventricular wall thickness. Right ventricular systolic function is hyperdynamic. There is mildly elevated pulmonary artery systolic pressure.  The tricuspid regurgitant velocity is 3.18 m/s, and with an assumed right atrial pressure of 3 mmHg, the estimated right ventricular systolic pressure is 43.4 mmHg. Left Atrium: Left atrial size was normal in size. Right Atrium: Right atrial size was normal in size. Pericardium: There is no evidence of pericardial effusion. Mitral Valve: The mitral valve was not well visualized. Trivial mitral valve regurgitation. Tricuspid Valve: The tricuspid valve is grossly normal. Tricuspid valve regurgitation is mild. Aortic Valve: The aortic valve was not well visualized. There is moderate calcification of the aortic valve. Aortic valve regurgitation is not visualized. Mild to moderate aortic valve sclerosis/calcification is present, without any evidence of aortic stenosis. Pulmonic Valve: The pulmonic valve was normal in structure. Pulmonic valve regurgitation is not visualized. Aorta: The aortic arch was not well visualized. Venous: The inferior vena cava is normal in size with greater than 50% respiratory variability, suggesting right atrial pressure of 3 mmHg. IAS/Shunts: No atrial level shunt detected by color flow Doppler.  LEFT VENTRICLE PLAX 2D LVIDd:         3.60 cm  Diastology LVIDs:         1.70 cm  LV e' medial:    8.49 cm/s LV PW:         1.00 cm  LV E/e' medial:  8.3 LV IVS:        1.00 cm  LV e' lateral:   9.03 cm/s LVOT diam:     1.70 cm  LV E/e' lateral: 7.8 LVOT Area:     2.27 cm  RIGHT VENTRICLE         IVC TAPSE (M-mode): 1.6 cm  IVC diam: 1.50 cm LEFT ATRIUM             Index       RIGHT ATRIUM          Index LA diam:        2.70 cm 1.58 cm/m  RA Area:     8.87 cm LA Vol (A2C):   28.1 ml 16.42 ml/m RA Volume:   16.90 ml 9.87 ml/m LA Vol (A4C):   24.8 ml 14.49 ml/m LA Biplane Vol: 26.7 ml 15.60 ml/m   AORTA Ao Root diam: 2.50 cm MITRAL VALVE               TRICUSPID VALVE MV Area (PHT): 2.76 cm    TR Peak grad:   40.4 mmHg MV Decel Time: 275 msec  TR Vmax:        318.00 cm/s MV E velocity: 70.40  cm/s MV A velocity: 85.90 cm/s  SHUNTS MV E/A ratio:  0.82        Systemic Diam: 1.70 cm Zoila ShutterKenneth Hilty MD Electronically signed by Zoila ShutterKenneth Hilty MD Signature Date/Time: 08/15/2020/5:30:22 PM    Final     PHYSICAL EXAM General: NAD Mental Status: Alert, oriented to place, month, is off by one year, but overall thought content appropriate. Speech fluent without evidence of aphasia.   Cranial Nerves: II:  Visual fields grossly normal, pupils equal, round, reactive to light and accommodation III,IV, VI: ptosis not present, extra-ocular motions intact bilaterally V,VII: smile symmetric, facial light touch sensation normal bilaterally VIII: hearing normal bilaterally IX,X: uvula rises symmetrically XI: bilateral shoulder shrug XII: midline tongue extension without atrophy or fasciculations  Motor: Right :  Upper extremity   4/5                                      Left:     Upper extremity   4/5             Lower extremity   4/5                                                  Lower extremity   4/5 Tone and bulk:normal tone throughout; no atrophy noted. She is generally weak throughout, no focal deficits Sensory:  light touch intact throughout, bilaterally Deep Tendon Reflexes:  wnl Plantars: Right: downgoing                                Left: downgoing Cerebellar: normal finger-to-nose Gait: did not assess  ASSESSMENT/PLAN Beverly Howell is a 80 y.o. female with history of HTN, HLD, CKD 3, COVID (04/21/2020) found to have b/l DVT (apixaban) and stroke. MRI shows subacute strokes, mostly c/w previous strokes. Pt most likely has decompensated post rehab and multiple illnesses with generalized weakness. Echo shows 75% and is hyperdynamic, but no acute issues. No further wk up as this will not change her current max medical tx for vascular risks and secondary stroke prevention, including continuing Eliquis. We will s/o. Please call back if needed.   Hospital day # 0  Desiree  Metzger-Cihelka, ARNP-C, ANVP-BC Pager: [phone]   ATTENDING NOTE: I reviewed above note and agree with the assessment and plan. Pt was seen and examined.   80 year old female with history of hypertension, hyperlipidemia, CKD, COVID and stroke in 04/2020 admitted for bilateral lower extremity weakness and loss conscious.  Patient stated that on 3/3, she was ready to go to her doctor's appointment.  She came down from a second floor, she had to hold down stair rails for going downstairs.  She felt generalized weakness.  She was outside with her daughter waiting for the ride, she had acute onset bilateral lower extremity weakness, not up to hold herself, she collapsed with loss of consciousness.  She woke up sitting in the car.  She denies any unilateral symptoms.  Patient had a stroke in 04/2020 when she was admitted for Covid treatment.  She had left-sided weakness and slurred speech.  MRI showed right MCA  patchy infarct.  MRA negative.  Carotid Doppler negative.  Bilateral LE venous Doppler positive for acute DVT.  LDL 93, A1c 5.5.  Stroke etiology concern for COVID hypercoagulable state, however, paradoxical emboli cannot be ruled out.  She was put on heparin IV and then transition to Eliquis on discharge.  Also discharged with Lipitor 40.  During hospitalization, her condition complicated by dysphagia, pneumonia, MSSA bacteremia, status post chest tube placement.  She had a prolonged rehab before going home.  On this admission, MRI showed punctate DWI changes at right SO/CR.  Etiology concerning for subacute infarct.  However, when we compare with her previous MRI, the current DWI changes seems to be T2 shine through.  Also, based on her presentation, her symptoms most likely due to syncope, orthostatic hypotension, and deconditioning due to prolonged illness.  Recommend continue Eliquis and statin for stroke prevention.  Her EKGs and telemetry monitoring concerning for frequent PACs and MACs,  both of them high risk for PAF.  Recommend continue Eliquis at this time.  Her orthostatic vitals showed lying down 87/56, sitting 94/70, standing 3 minutes 94/70.  Recommend further BP management per primary team, avoid low BP.  For detailed assessment and plan, please refer to above as I have made changes wherever appropriate.   I called IMTS resident on call and relayed my recommendations above.  Neurology will sign off. Please call with questions. Pt will follow up with stroke clinic NP at [facility] in about 4 weeks. Thanks for the consult.   Marvel Plan, MD PhD Stroke Neurology 08/16/2020 6:37 PM     To contact Stroke Continuity provider, please refer to [email or web address]. After hours, contact General Neurology

## 2020-08-17 LAB — RETICULOCYTES
Immature Retic Fract: 2.1 % — ABNORMAL LOW (ref 2.3–15.9)
RBC.: 3.66 MIL/uL — ABNORMAL LOW (ref 3.87–5.11)
Retic Count, Absolute: 12.8 10*3/uL — ABNORMAL LOW (ref 19.0–186.0)
Retic Ct Pct: <0.4 % — ABNORMAL LOW (ref 0.4–3.1)

## 2020-08-17 LAB — BASIC METABOLIC PANEL
Anion gap: 9 (ref 5–15)
BUN: 8 mg/dL (ref 8–23)
CO2: 31 mmol/L (ref 22–32)
Calcium: 9.5 mg/dL (ref 8.9–10.3)
Chloride: 97 mmol/L — ABNORMAL LOW (ref 98–111)
Creatinine, Ser: 0.98 mg/dL (ref 0.44–1.00)
GFR, Estimated: 59 mL/min — ABNORMAL LOW (ref >60)
Glucose, Bld: 101 mg/dL — ABNORMAL HIGH (ref 70–99)
Potassium: 3.7 mmol/L (ref 3.5–5.1)
Sodium: 137 mmol/L (ref 135–145)

## 2020-08-17 LAB — CBC
HCT: 28.7 % — ABNORMAL LOW (ref 36.0–46.0)
Hemoglobin: 9.1 g/dL — ABNORMAL LOW (ref 12.0–15.0)
MCH: 28 pg (ref 26.0–34.0)
MCHC: 31.7 g/dL (ref 30.0–36.0)
MCV: 88.3 fL (ref 80.0–100.0)
Platelets: 333 10*3/uL (ref 150–400)
RBC: 3.25 MIL/uL — ABNORMAL LOW (ref 3.87–5.11)
RDW: 16.6 % — ABNORMAL HIGH (ref 11.5–15.5)
WBC: 7.6 10*3/uL (ref 4.0–10.5)
nRBC: 0 % (ref 0.0–0.2)

## 2020-08-17 LAB — TSH: TSH: 2.757 u[IU]/mL (ref 0.350–4.500)

## 2020-08-17 LAB — GLUCOSE, CAPILLARY: Glucose-Capillary: 82 mg/dL (ref 70–99)

## 2020-08-17 MED ORDER — LACTATED RINGERS IV BOLUS
1000.0000 mL | Freq: Once | INTRAVENOUS | Status: AC
  Administered 2020-08-17: 1000 mL via INTRAVENOUS

## 2020-08-17 NOTE — Progress Notes (Signed)
HD#0 Subjective:  Overnight Events: No overnight events  Patient states that she is asymptomatic today.  She did have one episode of emesis without nausea or abdominal pain.  She does state in the past that she has had difficulties with swallowing which has complicated her nutritional status in the outpatient setting.  This is evident with her chronic protein caloric malnutrition on previous CMP's.  Objective:  Vital signs in last 24 hours: Vitals:   08/17/20 1155 08/17/20 1338 08/17/20 1629 08/17/20 1740  BP: 102/64 105/71 100/62 92/63  Pulse: (!) 102 (!) 118 87 65  Resp:  20 15 16   Temp:  97.9 F (36.6 C)  98.4 F (36.9 C)  TempSrc:  Oral    SpO2:  100%    Weight:       Supplemental O2: Room Air SpO2: 100 %   Physical Exam:  Physical Exam Constitutional:      Appearance: Normal appearance. She is obese.  HENT:     Head: Normocephalic and atraumatic.  Eyes:     Extraocular Movements: Extraocular movements intact.  Cardiovascular:     Rate and Rhythm: Normal rate.     Pulses: Normal pulses.     Heart sounds: Normal heart sounds.  Pulmonary:     Effort: Pulmonary effort is normal.     Breath sounds: Normal breath sounds.  Abdominal:     General: Bowel sounds are normal.     Palpations: Abdomen is soft.     Tenderness: There is no abdominal tenderness.  Musculoskeletal:        General: Normal range of motion.     Cervical back: Normal range of motion.     Right lower leg: No edema.     Left lower leg: No edema.  Skin:    General: Skin is warm and dry.  Neurological:     Mental Status: She is alert and oriented to person, place, and time. Mental status is at baseline.  Psychiatric:        Mood and Affect: Mood normal.     Filed Weights   08/15/20 0500 08/16/20 0457 08/17/20 0406  Weight: 68.1 kg 68.9 kg 70.5 kg     Intake/Output Summary (Last 24 hours) at 08/17/2020 1912 Last data filed at 08/17/2020 1326 Gross per 24 hour  Intake 591 ml  Output 150  ml  Net 441 ml   Net IO Since Admission: 491 mL [08/17/20 1912]  Recent Labs    08/15/20 0834 08/16/20 0836 08/17/20 0826  GLUCAP 99 82 82     Pertinent Labs: CBC Latest Ref Rng & Units 08/17/2020 08/16/2020 08/15/2020  WBC 4.0 - 10.5 K/uL 7.6 7.1 6.5  Hemoglobin 12.0 - 15.0 g/dL 10/15/2020) 10.8(L) 10.1(L)  Hematocrit 36.0 - 46.0 % 28.7(L) 32.8(L) 30.1(L)  Platelets 150 - 400 K/uL 333 386 350    CMP Latest Ref Rng & Units 08/17/2020 08/16/2020 08/15/2020  Glucose 70 - 99 mg/dL 10/15/2020) 166(A) 71  BUN 8 - 23 mg/dL 8 6(L) 6(L)  Creatinine 0.44 - 1.00 mg/dL 630(Z 6.01 0.93  Sodium 135 - 145 mmol/L 137 137 139  Potassium 3.5 - 5.1 mmol/L 3.7 4.2 3.7  Chloride 98 - 111 mmol/L 97(L) 95(L) 96(L)  CO2 22 - 32 mmol/L 31 31 31   Calcium 8.9 - 10.3 mg/dL 9.5 9.3 8.9  Total Protein 6.5 - 8.1 g/dL - - -  Total Bilirubin 0.3 - 1.2 mg/dL - - -  Alkaline Phos 38 - 126 U/L - - -  AST 15 - 41 U/L - - -  ALT 0 - 44 U/L - - -    Imaging: No results found.  Assessment/Plan:   Active Problems:   Hypokalemia due to inadequate potassium intake   CVA (cerebral vascular accident) [facility])   Patient Summary: Zelene Curtisis a 80 y.o.with a pertinent PMH of HTN, HLD,CKD stage III,and recent COVID infection with CVA with residual difficulty swallowing, multifocal necrotizing pneumonia,right-sided hydropneumothorax,MSSA empyemas/pinsertion of chesttubes, who presented witha1 month history of generalized weaknessanddevelopedworsening of her bilateral lower extremity weakness that resulted in fallspace prior to arrivaland admitted for further work-up of generalized weakness and hypokalemia.  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  Seems to be residual from her previous subacute strokes.   Otherwise work-up has been negative for acute strokes.  Will likely need to consult gastroenterology for further evaluation and management of he likely dysphagia.  She will likely need an upper endoscopy at some point to further evaluate this and possible manometry. -Consult gastroenterology tomorrow for further evaluation management -Continue PT/OT -Continue risk factor modification including cholesterol lowering and managing diabetes/hyperglycemia. -We will consult registered dietitians for further nutrition evaluation.  Supraventricular tachycardia, PAC vs Afib: Patient has a history of frequent PACs with supraventricular tachycardia.  She does not have any previously diagnosed atrial fibrillation.  She is on titis and in the past which may have complicated her presentation during this admission.  She apparently had syncopal episodes precipitated her presenting symptoms of generalized weakness.  Diltiazem was discontinued yesterday due to hypotension, as result she developed increased heart rate with concern for atrial fibrillation. -We will consult cardiology for further evaluation management for arrhythmia -She will likely need amiodarone versus digoxin for rate control.  History of Covid: History of multifocal necrotizing pneumonia: History of right lower lung empyema and pneumothorax status post chest tube placement: She does not have any acute pulmonary symptoms. -Continue Augmentin.  Will need to decide stop date. -She will likely need follow-up with CT surgery and infectious disease in outpatient setting for further evaluation and management.  History of bilateral lower extremity DVT: -Continue Eliquis 5 mg twice daily for now -Consider repeating lower extremity venous duplex.  Hypertension:.  -Hold home BP medications for now -We will continue to monitor closely in context of recent stroke  Hyperlipidemia: -Continue high intensity statin therapy.  Dysphagia Protein caloric  malnutrition Patient likely has oropharyngeal dysphagia in the setting of her recent subacute strokes.  She will likely need upper endoscopy and possibly manometry in the not-too-distant future -We will consult GI for further evaluation management -We will consult RD for nutritional assessment.  Diet:Regular PYK:DXIP,JASN KNL:ZJQBHAL Code:Full PT/OT recs:Home health TOC recs:None at this time  Dispo: Anticipated discharge in3days pendingfurther work-up of her acute CVA.  Chari Manning, D.O.  Internal Medicine Resident, PGY-2 [facility]  Pager: [phone] 7:12 PM, 08/17/2020   Please contact the on call pager after 5 pm and on weekends at [phone].

## 2020-08-17 NOTE — Progress Notes (Incomplete)
Bowel movement and had small emesis without nausea when trying to go to the bathroom No abdominal pain and she had some dysphagia on recent speech screening.   Heart rates elevated to 120s  dilt stopped yesterday due to hypotension.  Echo showed hyperdaynamic like volume down and chornic amlnutretion.

## 2020-08-17 NOTE — Significant Event (Signed)
Rapid Response Event Note   Reason for Call :  Tachycardia  Initial Focused Assessment:  Charge RN called to inform that this patient has been in and out of atrial fibrilation.  They stated that her heartrate got as high as the 150s.  The patient was getting Diltiazem but that was discontinued due to soft BP.  When I got to the room the patient was resting soundly.  She woke up and was pleasant to converse with.  She is AO x 4.  Patient denies any SOB, chest pain, or palpitations. Her only complaint is left knee pain.    Negative homans' sign.  Denies pain on inspiration.  Her left arm appears to be more swollen than the right and she endorses pain at the IV site.  The IV site was dripping IV fluid.  Her RN removed the IV.  BP 100/62 (73) Pulse 87 O2 90% room air RR 15 Temp 97.9 oral    Interventions:  Vital taken and patient assessed RN removed IV and I assessed for alternative IV access unsucessfully.    Plan of Care:  Patient will remain on the unit.  She has been instructed to let RN know if she has any chest pain, SOB, palpitations, feelings of impending doom.     Event Summary:   MD Notified:  Call Time: 1133 Arrival Time: 1520 (rapid response was unable to come before) End Time: 1630  Andrey Spearman, RN

## 2020-08-18 DIAGNOSIS — N183 Chronic kidney disease, stage 3 unspecified: Secondary | ICD-10-CM | POA: Diagnosis present

## 2020-08-18 DIAGNOSIS — E46 Unspecified protein-calorie malnutrition: Secondary | ICD-10-CM | POA: Diagnosis not present

## 2020-08-18 DIAGNOSIS — Z86718 Personal history of other venous thrombosis and embolism: Secondary | ICD-10-CM | POA: Diagnosis not present

## 2020-08-18 DIAGNOSIS — E785 Hyperlipidemia, unspecified: Secondary | ICD-10-CM | POA: Diagnosis not present

## 2020-08-18 DIAGNOSIS — I951 Orthostatic hypotension: Secondary | ICD-10-CM | POA: Diagnosis not present

## 2020-08-18 DIAGNOSIS — I639 Cerebral infarction, unspecified: Secondary | ICD-10-CM | POA: Diagnosis not present

## 2020-08-18 DIAGNOSIS — Z800 Family history of malignant neoplasm of digestive organs: Secondary | ICD-10-CM | POA: Diagnosis not present

## 2020-08-18 DIAGNOSIS — R1312 Dysphagia, oropharyngeal phase: Secondary | ICD-10-CM | POA: Diagnosis present

## 2020-08-18 DIAGNOSIS — E878 Other disorders of electrolyte and fluid balance, not elsewhere classified: Secondary | ICD-10-CM | POA: Diagnosis not present

## 2020-08-18 DIAGNOSIS — N179 Acute kidney failure, unspecified: Secondary | ICD-10-CM | POA: Diagnosis not present

## 2020-08-18 DIAGNOSIS — Z823 Family history of stroke: Secondary | ICD-10-CM | POA: Diagnosis not present

## 2020-08-18 DIAGNOSIS — E1122 Type 2 diabetes mellitus with diabetic chronic kidney disease: Secondary | ICD-10-CM | POA: Diagnosis present

## 2020-08-18 DIAGNOSIS — K222 Esophageal obstruction: Secondary | ICD-10-CM | POA: Diagnosis not present

## 2020-08-18 DIAGNOSIS — E869 Volume depletion, unspecified: Secondary | ICD-10-CM | POA: Diagnosis present

## 2020-08-18 DIAGNOSIS — R531 Weakness: Secondary | ICD-10-CM | POA: Diagnosis not present

## 2020-08-18 DIAGNOSIS — D509 Iron deficiency anemia, unspecified: Secondary | ICD-10-CM | POA: Diagnosis present

## 2020-08-18 DIAGNOSIS — I471 Supraventricular tachycardia: Secondary | ICD-10-CM

## 2020-08-18 DIAGNOSIS — E876 Hypokalemia: Secondary | ICD-10-CM | POA: Diagnosis present

## 2020-08-18 DIAGNOSIS — Z8249 Family history of ischemic heart disease and other diseases of the circulatory system: Secondary | ICD-10-CM | POA: Diagnosis not present

## 2020-08-18 DIAGNOSIS — J850 Gangrene and necrosis of lung: Secondary | ICD-10-CM | POA: Diagnosis not present

## 2020-08-18 DIAGNOSIS — I959 Hypotension, unspecified: Secondary | ICD-10-CM

## 2020-08-18 DIAGNOSIS — R1310 Dysphagia, unspecified: Secondary | ICD-10-CM | POA: Diagnosis not present

## 2020-08-18 DIAGNOSIS — Z6826 Body mass index (BMI) 26.0-26.9, adult: Secondary | ICD-10-CM | POA: Diagnosis not present

## 2020-08-18 DIAGNOSIS — I6310 Cerebral infarction due to embolism of unspecified precerebral artery: Secondary | ICD-10-CM | POA: Diagnosis not present

## 2020-08-18 DIAGNOSIS — I129 Hypertensive chronic kidney disease with stage 1 through stage 4 chronic kidney disease, or unspecified chronic kidney disease: Secondary | ICD-10-CM | POA: Diagnosis present

## 2020-08-18 DIAGNOSIS — E7849 Other hyperlipidemia: Secondary | ICD-10-CM

## 2020-08-18 DIAGNOSIS — Z20822 Contact with and (suspected) exposure to covid-19: Secondary | ICD-10-CM | POA: Diagnosis not present

## 2020-08-18 DIAGNOSIS — I69391 Dysphagia following cerebral infarction: Secondary | ICD-10-CM | POA: Diagnosis not present

## 2020-08-18 DIAGNOSIS — E7800 Pure hypercholesterolemia, unspecified: Secondary | ICD-10-CM | POA: Diagnosis not present

## 2020-08-18 DIAGNOSIS — Z8616 Personal history of COVID-19: Secondary | ICD-10-CM | POA: Diagnosis not present

## 2020-08-18 LAB — IRON AND TIBC
Iron: 27 ug/dL — ABNORMAL LOW (ref 28–170)
Iron: 37 ug/dL (ref 28–170)
Saturation Ratios: 16 % (ref 10.4–31.8)
Saturation Ratios: 23 % (ref 10.4–31.8)
TIBC: 171 ug/dL — ABNORMAL LOW (ref 250–450)
TIBC: 164 ug/dL — ABNORMAL LOW (ref 250–450)
UIBC: 144 ug/dL
UIBC: 127 ug/dL

## 2020-08-18 LAB — COMPREHENSIVE METABOLIC PANEL
ALT: 21 U/L (ref 0–44)
AST: 36 U/L (ref 15–41)
Albumin: 2.8 g/dL — ABNORMAL LOW (ref 3.5–5.0)
Alkaline Phosphatase: 71 U/L (ref 38–126)
Anion gap: 10 (ref 5–15)
BUN: 9 mg/dL (ref 8–23)
CO2: 27 mmol/L (ref 22–32)
Calcium: 10.4 mg/dL — ABNORMAL HIGH (ref 8.9–10.3)
Chloride: 102 mmol/L (ref 98–111)
Creatinine, Ser: 1.12 mg/dL — ABNORMAL HIGH (ref 0.44–1.00)
GFR, Estimated: 50 mL/min — ABNORMAL LOW (ref >60)
Glucose, Bld: 109 mg/dL — ABNORMAL HIGH (ref 70–99)
Potassium: 5 mmol/L (ref 3.5–5.1)
Sodium: 139 mmol/L (ref 135–145)
Total Bilirubin: 0.7 mg/dL (ref 0.3–1.2)
Total Protein: 5.4 g/dL — ABNORMAL LOW (ref 6.5–8.1)

## 2020-08-18 LAB — CBC
HCT: 35.5 % — ABNORMAL LOW (ref 36.0–46.0)
Hemoglobin: 11 g/dL — ABNORMAL LOW (ref 12.0–15.0)
MCH: 28.4 pg (ref 26.0–34.0)
MCHC: 31 g/dL (ref 30.0–36.0)
MCV: 91.7 fL (ref 80.0–100.0)
Platelets: 449 10*3/uL — ABNORMAL HIGH (ref 150–400)
RBC: 3.87 MIL/uL (ref 3.87–5.11)
RDW: 16.9 % — ABNORMAL HIGH (ref 11.5–15.5)
WBC: 10.6 10*3/uL — ABNORMAL HIGH (ref 4.0–10.5)
nRBC: 0.5 % — ABNORMAL HIGH (ref 0.0–0.2)

## 2020-08-18 LAB — HEPARIN LEVEL (UNFRACTIONATED): Heparin Unfractionated: >2.2 IU/mL — ABNORMAL HIGH (ref 0.30–0.70)

## 2020-08-18 LAB — VITAMIN B12: Vitamin B-12: 451 pg/mL (ref 180–914)

## 2020-08-18 LAB — FERRITIN
Ferritin: 250 ng/mL (ref 11–307)
Ferritin: 229 ng/mL (ref 11–307)

## 2020-08-18 LAB — APTT: aPTT: 31 seconds (ref 24–36)

## 2020-08-18 LAB — FOLATE: Folate: 6.9 ng/mL (ref >5.9)

## 2020-08-18 LAB — GLUCOSE, CAPILLARY: Glucose-Capillary: 99 mg/dL (ref 70–99)

## 2020-08-18 LAB — CORTISOL-AM, BLOOD: Cortisol - AM: 8.6 ug/dL (ref 6.7–22.6)

## 2020-08-18 MED ORDER — HEPARIN (PORCINE) 25000 UT/250ML-% IV SOLN
950.0000 [IU]/h | INTRAVENOUS | Status: DC
  Administered 2020-08-18: 850 [IU]/h via INTRAVENOUS
  Administered 2020-08-19: 1050 [IU]/h via INTRAVENOUS
  Filled 2020-08-18 (×3): qty 250

## 2020-08-18 MED ORDER — LACTATED RINGERS IV SOLN: INTRAVENOUS | Status: DC

## 2020-08-18 MED ORDER — COSYNTROPIN 0.25 MG IJ SOLR
0.2500 mg | Freq: Once | INTRAMUSCULAR | Status: AC
  Administered 2020-08-19: 0.25 mg via INTRAVENOUS
  Filled 2020-08-18 (×2): qty 0.25

## 2020-08-18 MED ORDER — METOPROLOL TARTRATE 12.5 MG HALF TABLET
12.5000 mg | ORAL_TABLET | Freq: Two times a day (BID) | ORAL | Status: DC
  Administered 2020-08-18 – 2020-08-22 (×7): 12.5 mg via ORAL
  Filled 2020-08-18 (×7): qty 1

## 2020-08-18 MED ORDER — COSYNTROPIN 0.25 MG IJ SOLR: 0.2500 mg | Freq: Once | INTRAMUSCULAR | Status: DC

## 2020-08-18 NOTE — Progress Notes (Signed)
ANTICOAGULATION CONSULT NOTE - Initial Consult  Pharmacy Consult for Heparin (Eliquis on hold) Indication: hx bilateral DVT (04/2020)   No Known Allergies  Patient Measurements: Height: 5\' 3"  (160 cm) Weight: 70.5 kg (155 lb 6.8 oz) IBW/kg (Calculated) : 52.4 Heparin Dosing Weight: 70 kg  Vital Signs: Temp: 97.7 F (36.5 C) (03/07 1431) Temp Source: Oral (03/07 1055) BP: 114/81 (03/07 1431) Pulse Rate: 106 (03/07 1431)  Labs: Recent Labs    08/16/20 1059 08/17/20 0049 08/18/20 1029  HGB 10.8* 9.1* 11.0*  HCT 32.8* 28.7* 35.5*  PLT 386 333 449*  CREATININE 0.90 0.98 1.12*    Estimated Creatinine Clearance: 38.3 mL/min (A) (by C-G formula based on SCr of 1.12 mg/dL (H)).   Medical History: Past Medical History:  Diagnosis Date  . High cholesterol   . Hypertension    Assessment:  80 yr old female on Eliquis 5 mg BID PTA for hx bilateral DVT 04/22/20 during admission for COVID infection.  Also hx CVA 04/2020 and subacute CVA this admit. New SVT vs afib.  Swallowing issues, GI consulted and planning EGD on 08/21/20.  To transition from Eliquis to IV heparin for procedure.    Will use aPTTs for heparin monitoring, as heparin levels are expected to be falsely elevated due to recent Eliquis doses.  Last Eliquis dose 10am today.  IV heparin to begin ~10pm tonight, when next Eliquis dose was due.   Will use low therapeutic aPTT and heparin level goals due to CVA.  Goal of Therapy:  Heparin level 0.3-0.5 units/ml aPTT 66-84 seconds Monitor platelets by anticoagulation protocol: Yes   Plan:   Baseline heparin level and aPTT.  Begin IV heparin at 850 units/hr ~10pm tonight.   aPTT and heparin level in am, ~8hrs after drip begins.  Daily aPTT and heparin level until correlating; daily CBC.  Monitor for any signs/symptoms of bleeding.  Eliquis on hold.  10/21/20, RPh 08/18/2020,7:02 PM

## 2020-08-18 NOTE — Plan of Care (Signed)
  Problem: Education: Goal: Knowledge of secondary prevention will improve Outcome: Progressing   Problem: Education: Goal: Knowledge of patient specific risk factors addressed and post discharge goals established will improve Outcome: Progressing   

## 2020-08-18 NOTE — TOC Initial Note (Addendum)
Transition of Care [facility]) - Initial/Assessment Note    Patient Details  Name: Beverly Howell MRN: [id number] Date of Birth: 1940/12/25  Transition of [facility]) CM/SW Contact:    Kingsley Plan, RN Phone Number: 08/18/2020, 3:12 PM  Clinical Narrative:                 Patient from home with daughter. PT recommending HHPT, per chart patient recently arranged with [facility] for HHPT. Patient would like to continue with same agency. Left Kandee Keen with [facility] a message. Cory with [facility] can accept patient back. Will need orders and face to face .  Expected Discharge Plan: Home w Home Health Services     Patient Goals and CMS Choice Patient states their goals for this hospitalization and ongoing recovery are:: to return to home CMS Medicare.gov Compare Post Acute Care list provided to:: Patient Choice offered to / list presented to : Patient  Expected Discharge Plan and Services Expected Discharge Plan: Home w Home Health Services   Discharge Planning Services: CM Consult Post Acute Care Choice: Home Health Living arrangements for the past 2 months: Single Family Home                 DME Arranged: N/A         HH Arranged: PT HH Agency: [facility] Date [facility] Agency Contacted: 08/18/20 Time HH Agency Contacted: 1511 Representative spoke with at [facility] Agency: Kandee Keen , left a message  Prior Living Arrangements/Services Living arrangements for the past 2 months: Single Family Home Lives with:: Adult Children Patient language and need for interpreter reviewed:: Yes Do you feel safe going back to the place where you live?: Yes      Need for Family Participation in Patient Care: Yes (Comment) Care giver support system in place?: Yes (comment) Current home services: DME Criminal Activity/Legal Involvement Pertinent to Current Situation/Hospitalization: No - Comment as needed  Activities of Daily Living      Permission Sought/Granted   Permission granted to share information  with : No              Emotional Assessment Appearance:: Appears stated age Attitude/Demeanor/Rapport: Engaged Affect (typically observed): Accepting Orientation: : Oriented to Self,Oriented to Place,Oriented to  Time,Oriented to Situation Alcohol / Substance Use: Not Applicable Psych Involvement: No (comment)  Admission diagnosis:  Hypokalemia [E87.6] Weakness [R53.1] Hypokalemia due to inadequate potassium intake [E87.6] Patient Active Problem List   Diagnosis Date Noted  . Weakness 08/18/2020  . CVA (cerebral vascular accident) (HCC) 08/15/2020  . Hypokalemia due to inadequate potassium intake 08/14/2020  . Necrotizing pneumonia (HCC) 05/20/2020  . History of CVA in adulthood 05/20/2020  . History of DVT of lower extremity 05/20/2020  . Acute CVA (cerebrovascular accident) (HCC) 04/22/2020  . Pneumonia due to COVID-19 virus 04/22/2020  . ARF (acute renal failure) (HCC) 04/22/2020  . Essential hypertension 04/22/2020  . Acute respiratory failure (HCC) 04/21/2020   PCP:  Darryl Lent, PA-C Pharmacy:   [facility] [address] [place], [place] - [address] [place] [address] Phone: [phone] Fax: [phone]     Social Determinants of Health (SDOH) Interventions    Readmission Risk Interventions Readmission Risk Prevention Plan 06/05/2020  Transportation Screening Complete  PCP or Specialist Appt within 5-7 Days Complete  Home Care Screening Complete  Medication Review (RN CM) Complete  Some recent data might be hidden

## 2020-08-18 NOTE — Progress Notes (Signed)
Physical Therapy Treatment [name] MRN: [id number] DOB: 12-Oct-1940 Today's Date: 08/18/2020    History of Present Illness Pt is a 80 year old woman admitted from home with her daughter with weakness experienced at the bottom of the stairs when she was going to the doctor. +hypokalemia.  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 PMH: recent COVID, CVA, necrotizing PNA, T PTX, HTN, HLD, CKD III, dysphagia, MSSA empyema, B LE DVT. Pt developed tachycardia 3/6 during hospitalization.    PT Comments    Per chart, yesterday 3/6 pt was in and out of a fib as well as having tachycardia. Diltiazem had to be d/c'd due to soft BP. On arrival today, pt sitting in recliner without c/o. Resting HR 110. She required min guard assist sit to stand and min guard assist ambulation 25' with RW. 2/4 DOE noted and max HR 165. 3-minute seated recovery in recliner for HR to return to 120s. Pt without c/o pain. Pt remained in recliner. RN present in room.    Follow Up Recommendations  Home health PT     Equipment Recommendations  None recommended by PT    Recommendations for Other Services       Precautions / Restrictions Precautions Precautions: Fall;Other (comment) Precaution Comments: monitor HR    Mobility  Bed Mobility               General bed mobility comments: Pt received in recliner and returned to recliner.    Transfers Overall transfer level: Needs assistance Equipment used: Rolling walker (2 wheeled) Transfers: Sit to/from Stand Sit to Stand: Min guard         General transfer comment: min guard for safety  Ambulation/Gait Ambulation/Gait assistance: Min guard Gait Distance (Feet): 25 Feet Assistive device: Rolling walker (2 wheeled) Gait Pattern/deviations: Step-through pattern;Trunk flexed;Decreased stride length Gait velocity: decreased Gait velocity  interpretation: <1.31 ft/sec, indicative of household ambulator General Gait Details: Resting HR 110. Extreme tachycardia with minimal activity. Max HR 165 during in room mobility. 2/4 DOE. 3-minute recovery in recliner for HR to return to 120s.   Stairs             Wheelchair Mobility    Modified Rankin (Stroke Patients Only) Modified Rankin (Stroke Patients Only) Pre-Morbid Rankin Score: Moderate disability Modified Rankin: Moderately severe disability     Balance Overall balance assessment: Needs assistance Sitting-balance support: Feet supported;No upper extremity supported Sitting balance-Leahy Scale: Good     Standing balance support: Bilateral upper extremity supported;Single extremity supported Standing balance-Leahy Scale: Poor Standing balance comment: reliant on BUE support                            Cognition Arousal/Alertness: Awake/alert Behavior During Therapy: WFL for tasks assessed/performed Overall Cognitive Status: No family/caregiver present to determine baseline cognitive functioning                                        Exercises      General Comments        Pertinent Vitals/Pain Pain Assessment: No/denies pain    Home Living                      Prior Function  PT Goals (current goals can now be found in the care plan section) Acute Rehab PT Goals Patient Stated Goal: home Progress towards PT goals: Not progressing toward goals - comment (new onset tachycardia)    Frequency    Min 4X/week      PT Plan Current plan remains appropriate    Co-evaluation              AM-PAC PT "6 Clicks" Mobility   Outcome Measure  Help needed turning from your back to your side while in a flat bed without using bedrails?: A Little Help needed moving from lying on your back to sitting on the side of a flat bed without using bedrails?: A Little Help needed moving to and from a bed to a  chair (including a wheelchair)?: A Little Help needed standing up from a chair using your arms (e.g., wheelchair or bedside chair)?: A Little Help needed to walk in hospital room?: A Little Help needed climbing 3-5 steps with a railing? : A Little 6 Click Score: 18    End of Session Equipment Utilized During Treatment: Gait belt Activity Tolerance: Treatment limited secondary to medical complications (Comment) (tachy, DOE) Patient left: in chair;with call bell/phone within reach;with chair alarm set Nurse Communication: Mobility status;Other (comment) (HR) PT Visit Diagnosis: Muscle weakness (generalized) (M62.81);Difficulty in walking, not elsewhere classified (R26.2);Other symptoms and signs involving the nervous system (R29.898)     Time: 1245-8099 PT Time Calculation (min) (ACUTE ONLY): 11 min  Charges:  $Gait Training: 8-22 mins                     Aida Raider, PT  Office # [phone] Pager [phone]    Ilda Foil 08/18/2020, 12:35 PM

## 2020-08-18 NOTE — Plan of Care (Signed)

## 2020-08-18 NOTE — Progress Notes (Signed)
I  was notified by CCMD, pt HR 140-165 non-sustaining, notified MD at 1156.

## 2020-08-18 NOTE — Consult Note (Signed)
UNASSIGNED PATIENT Reason for Consult: Difficulty swallowing. Referring Physician: IMTS.  Beverly Howell is an 80 y.o. female.  HPI: 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  Patient's had problems with difficulty swallowing since her acute respiratory illness in November last year and as per my discussion with her daughter Beverly Howell, patient started losing weight about a month after her discharge from the hospital; she was discharged on 06/05/2020.  She had a modified barium swallow done on on 08/15/2020 that revealed normal oropharyngeal swallowing function with no penetration or aspiration of any consistencies.  The swallowing was intermittently related to the vallecula or piriform sinus with thin liquids.  Stasis was noted in the upper esophagus with pured and solid textures.  At this point patient felt there was something stuck in her esophagus that she would have to bring up occasionally but most boluses passed and the sensation resolved.  GI consultation was requested to further evaluate her dysphagia she is lost about 40 pounds since her acute illnesses in November last year.  As per my discussion with the patient's daughter the patient is spitting up mucus and has not been vomiting. Patient denies having any nausea or abdominal pain.  Apparently she has had about 3 colonoscopies in the past at [facility] where colonic polyps were removed but I do not have access to those records at the present time.    Past Medical History:  Diagnosis Date  . High cholesterol   . Hypertension    Past Surgical History:  Procedure Laterality Date  . HAND SURGERY      Family History  Problem Relation Age of Onset  . Stroke Mother    Social History:  reports that she has quit smoking. She has never used smokeless tobacco. She reports that she does not drink alcohol and does not use drugs.  Allergies: No Known Allergies  Medications: I have reviewed the patient's current medications.  Results for orders placed or performed during the hospital encounter of 08/14/20 (from the past 48 hour(s))  CBC     Status: Abnormal   Collection Time: 08/17/20 12:49 AM  Result Value Ref Range   WBC 7.6 4.0 - 10.5 K/uL   RBC 3.25 (L) 3.87 - 5.11 MIL/uL   Hemoglobin 9.1 (L) 12.0 - 15.0 g/dL   HCT 30.8 (L) 65.7 - 84.6 %   MCV 88.3 80.0 - 100.0 fL   MCH 28.0 26.0 - 34.0 pg   MCHC 31.7 30.0 - 36.0 g/dL   RDW 96.2 (H) 95.2 - 84.1 %   Platelets 333 150 - 400 K/uL   nRBC 0.0 0.0 - 0.2 %    Comment: Performed at [facility], [address] [place], [place] [address]  Basic metabolic panel     Status: Abnormal   Collection Time: 08/17/20 12:49 AM  Result Value Ref Range   Sodium 137 135 - 145 mmol/L   Potassium 3.7 3.5 - 5.1 mmol/L   Chloride 97 (L) 98 - 111 mmol/L   CO2 31 22 - 32 mmol/L   Glucose, Bld 101 (H) 70 - 99 mg/dL    Comment: Glucose reference range applies only to samples taken after fasting for at least 8 hours.   BUN 8 8 -  23 mg/dL   Creatinine, Ser 6.96 0.44 - 1.00 mg/dL   Calcium 9.5 8.9 - 29.5 mg/dL   GFR, Estimated 59 (L) >60 mL/min    Comment: (NOTE) Calculated using the CKD-EPI Creatinine Equation (2021)    Anion gap 9 5 - 15    Comment: Performed at [facility], [address], [place], [place] [address]  Glucose, capillary     Status: None   Collection Time: 08/17/20  8:26 AM  Result Value Ref Range   Glucose-Capillary 82 70 - 99 mg/dL    Comment: Glucose reference range applies only to samples taken after fasting for at least 8 hours.  Iron and TIBC     Status: Abnormal   Collection Time: 08/17/20  8:31 PM  Result  Value Ref Range   Iron 27 (L) 28 - 170 ug/dL   TIBC 244 (L) 010 - 272 ug/dL   Saturation Ratios 16 10.4 - 31.8 %   UIBC 144 ug/dL    Comment: Performed at [facility], [address] [place], [place] [address]  Ferritin     Status: None   Collection Time: 08/17/20  8:31 PM  Result Value Ref Range   Ferritin 250 11 - 307 ng/mL    Comment: Performed at [facility], [address] [place], [place] [address]  Reticulocytes     Status: Abnormal   Collection Time: 08/17/20  8:31 PM  Result Value Ref Range   Retic Ct Pct <0.4 (L) 0.4 - 3.1 %    Comment: REPEATED TO VERIFY   RBC. 3.66 (L) 3.87 - 5.11 MIL/uL   Retic Count, Absolute 12.8 (L) 19.0 - 186.0 K/uL   Immature Retic Fract 2.1 (L) 2.3 - 15.9 %    Comment: Performed at [facility], [address], [place], [place] [address]  TSH     Status: None   Collection Time: 08/17/20  8:39 PM  Result Value Ref Range   TSH 2.757 0.350 - 4.500 uIU/mL    Comment: Performed by a 3rd Generation assay with a functional sensitivity of <=0.01 uIU/mL. Performed at [facility], [address], [place], [place] [address]   Cortisol-am, blood     Status: None   Collection Time: 08/18/20  5:38 AM  Result Value Ref Range   Cortisol - AM 8.6 6.7 - 22.6 ug/dL    Comment: Performed at [facility], [address] [place], [place] [address]  Glucose, capillary     Status: None   Collection Time: 08/18/20  8:59 AM  Result Value Ref Range   Glucose-Capillary 99 70 - 99 mg/dL    Comment: Glucose reference range applies only to samples taken after fasting for at least 8 hours.  Vitamin B12     Status: None   Collection Time: 08/18/20 10:29 AM  Result Value Ref Range   Vitamin B-12 451 180 - 914 pg/mL    Comment: (NOTE) This assay is not validated for testing neonatal or myeloproliferative syndrome specimens for Vitamin B12 levels. Performed at [facility], [address], [place], [place] [address]   Folate      Status: None   Collection Time: 08/18/20 10:29 AM  Result Value Ref Range   Folate 6.9 >5.9 ng/mL    Comment: Performed at [facility], [address], [place], [place] [address]  Iron and TIBC     Status: Abnormal   Collection  Time: 08/18/20 10:29 AM  Result Value Ref Range   Iron 37 28 - 170 ug/dL   TIBC 161164 (L) 096250 - 045450 ug/dL   Saturation Ratios 23 10.4 - 31.8 %   UIBC 127 ug/dL    Comment: Performed at [facility], [address] [place], [place] [address]  Ferritin     Status: None   Collection Time: 08/18/20 10:29 AM  Result Value Ref Range   Ferritin 229 11 - 307 ng/mL    Comment: Performed at [facility], [address] [place], [place] [address]  CBC     Status: Abnormal   Collection Time: 08/18/20 10:29 AM  Result Value Ref Range   WBC 10.6 (H) 4.0 - 10.5 K/uL   RBC 3.87 3.87 - 5.11 MIL/uL   Hemoglobin 11.0 (L) 12.0 - 15.0 g/dL   HCT 82.935.5 (L) 56.236.0 - 13.046.0 %   MCV 91.7 80.0 - 100.0 fL   MCH 28.4 26.0 - 34.0 pg   MCHC 31.0 30.0 - 36.0 g/dL   RDW 86.516.9 (H) 78.411.5 - 69.615.5 %   Platelets 449 (H) 150 - 400 K/uL   nRBC 0.5 (H) 0.0 - 0.2 %    Comment: Performed at [facility], [address] [place], [place] [address]  Comprehensive metabolic panel     Status: Abnormal   Collection Time: 08/18/20 10:29 AM  Result Value Ref Range   Sodium 139 135 - 145 mmol/L   Potassium 5.0 3.5 - 5.1 mmol/L   Chloride 102 98 - 111 mmol/L   CO2 27 22 - 32 mmol/L   Glucose, Bld 109 (H) 70 - 99 mg/dL    Comment: Glucose reference range applies only to samples taken after fasting for at least 8 hours.   BUN 9 8 - 23 mg/dL   Creatinine, Ser 4.131.12 (H) 0.44 - 1.00 mg/dL   Calcium 24.410.4 (H) 8.9 - 10.3 mg/dL   Total Protein 5.4 (L) 6.5 - 8.1 g/dL   Albumin 2.8 (L) 3.5 - 5.0 g/dL   AST 36 15 - 41 U/L   ALT 21 0 - 44 U/L   Alkaline Phosphatase 71 38 - 126 U/L   Total Bilirubin 0.7 0.3 - 1.2 mg/dL   GFR, Estimated 50 (L) >60 mL/min    Comment: (NOTE) Calculated using  the CKD-EPI Creatinine Equation (2021)    Anion gap 10 5 - 15    Comment: Performed at [facility], [address] [place], [place] [address]   Review of Systems  Constitutional: Positive for activity change, appetite change, fatigue and unexpected weight change. Negative for chills, diaphoresis and fever.  Eyes: Negative.   Respiratory: Positive for cough and choking. Negative for apnea, chest tightness, shortness of breath, wheezing and stridor.   Gastrointestinal: Positive for vomiting. Negative for abdominal distention, abdominal pain, anal bleeding, blood in stool, constipation, diarrhea, nausea and rectal pain.  Endocrine: Negative.   Genitourinary: Negative.   Musculoskeletal: Positive for arthralgias.  Skin: Negative.   Allergic/Immunologic: Negative.   Neurological: Positive for weakness. Negative for dizziness and facial asymmetry.  Hematological: Negative.   Psychiatric/Behavioral: Negative for self-injury and suicidal ideas. The patient is nervous/anxious.    Blood pressure 114/81, pulse (!) 106, temperature 97.7 F (36.5 C), resp. rate 18, weight 70.5 kg, SpO2 99 %. Physical Exam Constitutional:      General: She is not in acute distress. HENT:     Head: Normocephalic and atraumatic.  Mouth/Throat:     Mouth: Mucous membranes are moist.  Eyes:     Extraocular Movements: Extraocular movements intact.  Cardiovascular:     Rate and Rhythm: Normal rate and regular rhythm.  Pulmonary:     Effort: Pulmonary effort is normal.     Breath sounds: Normal breath sounds.  Abdominal:     General: Abdomen is flat.     Palpations: Abdomen is soft.  Skin:    General: Skin is warm and dry.  Neurological:     Mental Status: She is alert and oriented to person, place, and time.  Psychiatric:        Mood and Affect: Mood normal.        Behavior: Behavior normal.        Thought Content: Thought content normal.   Assessment/Plan: 1) Dysphagia/abnormal weight  loss-following stroke and complicated prolonged illness due to Covid, with necrotizing pneumonia and multiple strokes-EGD is planned for the patient on 08/21/2020 after Eliquis has been held for a couple of days. This will have to be cleared by the primary care team and the current neurology service.  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 3) Supraventricular tachycardia/PACs versus A. Fib. 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 5) Hypertension/Hyperlipidemia. 6) Hypokalemia. 7) Covid infection in November 2021 complicated by stroke and bilateral DVTs and MSSA empyema and multifocal necrotizing pneumonitis. 8) Iron deficiency anemia. 9) History of colonic polyps followed by Dr. Toma Copier GI.  Charna Elizabeth 08/18/2020, 3:20 PM

## 2020-08-18 NOTE — Progress Notes (Addendum)
Nutrition Follow-up  DOCUMENTATION CODES:   Not applicable  INTERVENTION:   -D/c calorie count -Continue Ensure Enlive po TID, each supplement provides 350 kcal and 20 grams of protein -Continue Magic cup TID with meals, each supplement provides 290 kcal and 9 grams of protein -Continue MVI with minerals daily -Downgrade diet to dysphagia 3 diet due to ease of intake  NUTRITION DIAGNOSIS:   Predicted suboptimal nutrient intake related to dysphagia,decreased appetite as evidenced by percent weight loss.  Ongoing  GOAL:   Patient will meet greater than or equal to 90% of their needs  Progressing   MONITOR:   PO intake,Supplement acceptance,Diet advancement,Labs,Weight trends,Skin,I & O's  REASON FOR ASSESSMENT:   Consult Assessment of nutrition requirement/status,Calorie Count  ASSESSMENT:   80 year old female with a history of HTN, HLD, CKD stage III, and recent COVID infection with CVA with residual difficulty swallowing, multifocal necrotizing pneumonia, right-sided hydropneumothorax, 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  Reviewed I/O's: +254 ml x 24 hours and +391 ml since admission  UOP: 100 ml x 24 hours  Spoke with pt, who was sitting in recliner chair at time of visit. She reports that she had a poor appetite PTA, consuming mostly soups and liquids. Pt shares that she has been eating more since being hospitalized, but thinks that her current diet contains too many tough foods, which take a lot of time for her to eat. She really enjoys her Ensure and [employer] and makes she she eats those. Observed breakfast tray- pt consumed only milk and juice off tray.   3/4-3/5 Breakfast: 25 kcals, 5 grams protein Lunch: 145 kcals, 9 grams protein Dinner: 145 kcals, 5 grams protein Supplements: 350 kcals, 20 grams protein  Total intake: 665 kcal (32% of minimum estimated needs)  39 grams  protein (37% of minimum estimated needs)  Reviewed wt hx; pt has experienced a 16.4% wt loss over the past 3 months, which is significant for time frame.   Discussed importance of good meal and supplement intake to promote healing. Also discussed mechanically downgrading diet for ease of intake; pt agreeable. Pt would like to continue with current supplements and very appreciative.   Medications reviewed and include lactated ringers @ 100 ml/hr.   Labs reviewed.   NUTRITION - FOCUSED PHYSICAL EXAM:  Flowsheet Row Most Recent Value  Orbital Region No depletion  Upper Arm Region Mild depletion  Thoracic and Lumbar Region No depletion  Buccal Region No depletion  Temple Region No depletion  Clavicle Bone Region No depletion  Clavicle and Acromion Bone Region No depletion  Scapular Bone Region No depletion  Dorsal Hand No depletion  Patellar Region Mild depletion  Anterior Thigh Region Mild depletion  Posterior Calf Region Mild depletion  Edema (RD Assessment) None  Hair Reviewed  Eyes Reviewed  Mouth Reviewed  Skin Reviewed  Nails Reviewed       Diet Order:   Diet Order            DIET DYS 3 Room service appropriate? Yes with Assist; Fluid consistency: Thin  Diet effective now                 EDUCATION NEEDS:   No education needs have been identified at this time  Skin:  Skin Assessment: Reviewed RN Assessment  Last BM:  08/18/20  Height:   Ht Readings from Last 1 Encounters:  06/19/20 5\' 3"  (  1.6 m)    Weight:   Wt Readings from Last 1 Encounters:  08/17/20 70.5 kg    Ideal Body Weight:  52.3 kg  BMI:  Body mass index is 27.53 kg/m.  Estimated Nutritional Needs:   Kcal:  2050-2250  Protein:  105-120 grams  Fluid:  > 2 L    Levada Schilling, RD, LDN, CDCES Registered Dietitian II Certified Diabetes Care and Education Specialist Please refer to [facility] for RD and/or RD on-call/weekend/after hours pager

## 2020-08-18 NOTE — Progress Notes (Signed)
HD#2 Subjective:  Overnight Events: No acute events overnight  Patient sitting up in recliner, doing well.  Denies any new episodes of weakness.  Denies episode of chest pain or shortness of breath.  States she is eating well at this time, however, she does endorse vomiting yesterday after eating.  Overall she states she is feeling well.  Objective:  Vital signs in last 24 hours:       Vitals:   08/15/20 0208 08/15/20 0500 08/15/20 0532 08/15/20 1439  BP: 129/70  119/60 112/70  Pulse: 68  63 75  Resp: _0 Temp: 98.1 F (36.7 C)  97.7 F (36.5 C) 97.8 F (36.6 C)  TempSrc: Oral  Oral Oral  SpO2: 100%  98% 100%  Weight:  68.1 kg     Supplemental O2: Room Air SpO2: 100 %   Physical Exam:  Constitutional: well-appearing HENT: normocephalic atraumatic Eyes: conjunctiva non-erythematous. Neck: supple Cardiovascular: Irregular rate and rhythm, no m/r/g Pulmonary/Chest: normal work of breathing on room air MSK: normal bulk and tone Neurological: alert & oriented x 3.  Psych: Normal mood     Filed Weights   08/15/20 0500  Weight: 68.1 kg     Imaging: MR BRAIN WO CONTRAST  Result Date: 08/15/2020 CLINICAL DATA:  weakness EXAM: MRI HEAD WITHOUT CONTRAST TECHNIQUE: Multiplanar, multiecho pulse sequences of the brain and surrounding structures were obtained without intravenous contrast. COMPARISON:  Concurrent MRV head.  04/22/2020 and prior. FINDINGS: Brain: 3 mm right centrum semiovale DWI hyperintensity. Slightly larger DWI hyperintensity involving the right corona radiata. Sequela of small remote right MCA territory insults, better demonstrated on prior exam. Small remote right parietal hemorrhages. No midline shift, ventriculomegaly or extra-axial fluid collection. No mass lesion. Chronic left cerebellar insult. Mild chronic microvascular ischemic changes. Vascular: Major intracranial flow voids are preserved at the skull base. Prominent  flow voids on SWI involving the right corona radiata and occipital region. Skull and upper cervical spine: Normal marrow signal. Sinuses/Orbits: Normal orbits. Pneumatized paranasal sinuses. Trace mastoid free fluid. Other: None. IMPRESSION: 3 mm subacute right centrum semiovale infarct. Slightly larger subacute infarct involving the right corona radiata. 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 Consider postcontrast MRI for better characterization. Multifocal remote chronic insults as detailed above. Mild chronic microvascular ischemic changes. These results will be called to the ordering clinician or representative by the Radiologist Assistant, and communication documented in the PACS or [employer]. Electronically Signed   By: Primitivo Gauze M.D.   On: 08/15/2020 13:02   DG Swallowing Func-Speech Pathology  Result Date: 08/15/2020 Objective Swallowing Evaluation: Type of Study: MBS-Modified Barium Swallow Study  [name] MRN: [id number] Date of Birth: [birth year] Today's Date: 08/15/2020 Time: SLP Start Time (ACUTE ONLY): 1043 -SLP Stop Time (ACUTE ONLY): 1054 SLP Time Calculation (min) (ACUTE ONLY): 11 min Past Medical History: Past Medical History: Diagnosis Date . High cholesterol  . Hypertension  Past Surgical History: Past Surgical History: Procedure Laterality Date . HAND SURGERY   HPI: This is 80 year old female with a history of HTN, HLD, CKD stage III, and recent COVID infection with 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 Pt reports swallowing difficulty since November, but not immediately following stroke.  Pt was not seen by ST during admission for  CVA, presumably because of no dysphagia at that time.  Subjective: Pt  awake, alert, pleasant, participative Assessment / Plan / Recommendation CHL IP CLINICAL IMPRESSIONS 08/15/2020 Clinical Impression Pt presents with normal oropharyngeal swallow function.  There was no penetration or aspiration of any consistencies trialed.  There was no pharyngeal residue. Swallow was intermittently delayed to vallecula or pyriform sinuses with thin liquids, but remains WFL, especially considering pt's age.  There was stasis of contrast noted in upper esophagus with puree and solid textures.  Pt stated that she felt something was stuck or she would have to bring something up on several occaision and bolus trials were paused to allow this sensation to pass.  With pill simulation there was initially difficulty with oral transit at pill was expectorated, but pt was able to clear pill on second attempt when self-administered.  On esophageal sweep, tablet could not be located, but there was contrast material remaining in esophagus. Of note, there was mild narrowing of the pharynx, which seems to be related to cervical osteophytes which did not impair swallow function.  Recommend regular texture diet with thin liquid. Consider GI consult as pt's dysphagia symptoms are impacting her level of function and seem to be causing her distress. SLP Visit Diagnosis Dysphagia, unspecified (R13.10) Attention and concentration deficit following -- Frontal lobe and executive function deficit following -- Impact on safety and function No limitations   CHL IP TREATMENT RECOMMENDATION 08/15/2020 Treatment Recommendations No treatment recommended at this time   Prognosis 08/15/2020 Prognosis for Safe Diet Advancement (No Data) Barriers to Reach Goals -- Barriers/Prognosis Comment -- CHL IP DIET RECOMMENDATION 08/15/2020 SLP Diet Recommendations Regular solids;Thin liquid Liquid Administration via Cup;Straw Medication Administration Whole meds with liquid Compensations Slow rate;Small sips/bites Postural Changes Seated upright at  90 degrees;Remain semi-upright after after feeds/meals (Comment)   CHL IP OTHER RECOMMENDATIONS 08/15/2020 Recommended Consults Consider GI evaluation;Consider esophageal assessment Oral Care Recommendations Oral care BID Other Recommendations --   CHL IP FOLLOW UP RECOMMENDATIONS 08/15/2020 Follow up Recommendations None   CHL IP FREQUENCY AND DURATION 08/15/2020 Speech Therapy Frequency (ACUTE ONLY) (No Data) Treatment Duration --      CHL IP ORAL PHASE 08/15/2020 Oral Phase Impaired Oral - Pudding Teaspoon -- Oral - Pudding Cup -- Oral - Honey Teaspoon -- Oral - Honey Cup -- Oral - Nectar Teaspoon -- Oral - Nectar Cup -- Oral - Nectar Straw -- Oral - Thin Teaspoon -- Oral - Thin Cup WFL Oral - Thin Straw WFL Oral - Puree WFL Oral - Mech Soft -- Oral - Regular Piecemeal swallowing Oral - Multi-Consistency -- Oral - Pill Reduced posterior propulsion Oral Phase - Comment --  CHL IP PHARYNGEAL PHASE 08/15/2020 Pharyngeal Phase Impaired Pharyngeal- Pudding Teaspoon -- Pharyngeal -- Pharyngeal- Pudding Cup -- Pharyngeal -- Pharyngeal- Honey Teaspoon -- Pharyngeal -- Pharyngeal- Honey Cup -- Pharyngeal -- Pharyngeal- Nectar Teaspoon -- Pharyngeal -- Pharyngeal- Nectar Cup -- Pharyngeal -- Pharyngeal- Nectar Straw -- Pharyngeal -- Pharyngeal- Thin Teaspoon -- Pharyngeal -- Pharyngeal- Thin Cup Delayed swallow initiation-vallecula;Delayed swallow initiation-pyriform sinuses Pharyngeal Material does not enter airway Pharyngeal- Thin Straw Delayed swallow initiation-vallecula;Delayed swallow initiation-pyriform sinuses Pharyngeal Material does not enter airway Pharyngeal- Puree WFL Pharyngeal Material does not enter airway Pharyngeal- Mechanical Soft -- Pharyngeal -- Pharyngeal- Regular WFL Pharyngeal Material does not enter airway Pharyngeal- Multi-consistency -- Pharyngeal -- Pharyngeal- Pill WFL Pharyngeal Material does not enter airway Pharyngeal Comment --  CHL IP CERVICAL ESOPHAGEAL PHASE 08/15/2020 Cervical Esophageal Phase  Impaired Pudding Teaspoon --  Pudding Cup -- Honey Teaspoon -- Honey Cup -- Nectar Teaspoon -- Nectar Cup -- Nectar Straw -- Thin Teaspoon -- Thin Cup WFL Thin Straw WFL Puree (No Data) Mechanical Soft -- Regular (No Data) Multi-consistency -- Pill WFL Cervical Esophageal Comment -- Celedonio Savage, MA, CCC-SLP Acute Rehabilitation Services Office: [phone] 08/15/2020, 11:43 AM              MR MRV HEAD WO CM  Result Date: 08/15/2020 CLINICAL DATA:  Weakness of both lower extremities. EXAM: MR VENOGRAM OF THE HEAD WITHOUT CONTRAST TECHNIQUE: Angiographic images of the intracranial venous structures were obtained using MRV technique without intravenous contrast. COMPARISON:  04/22/2020 FINDINGS: Superior sagittal sinus is patent and normal. Both transverse sinuses are patent, with the left being larger than the right. Sigmoid sinuses and jugular veins are patent. Deep veins are patent. No visible superficial thrombosis. IMPRESSION: Normal intracranial venography. Electronically Signed   By: Nelson Chimes M.D.   On: 08/15/2020 11:40   ECHOCARDIOGRAM COMPLETE  Result Date: 08/15/2020    ECHOCARDIOGRAM REPORT   Patient Name:   ROSAMAE ROCQUE Date of Exam: 08/15/2020 Medical Rec #:  [id number]     Height:       63.0 in Accession #:    [id number]    Weight:       150.1 lb Date of Birth:  06/26/1940      BSA:          1.712 m Patient Age:    81 years      BP:           112/70 mmHg Patient Gender: F             HR:           83 bpm. Exam Location:  Inpatient Procedure: 2D Echo Indications:    stroke  History:        Patient has prior history of Echocardiogram examinations, most                 recent 04/23/2020. Abnormal ECG, chronic kidney disease; Risk                 Factors:Hypertension and Dyslipidemia.  Sonographer:    Johny Chess Referring Phys: Kellen.Fila EMILY B MULLEN  Sonographer Comments: Image acquisition challenging due to respiratory motion. IMPRESSIONS  1. Left ventricular ejection fraction, by estimation,  is >75%. The left ventricle has hyperdynamic function. The left ventricle has no regional wall motion abnormalities. Left ventricular diastolic parameters are consistent with Grade I diastolic dysfunction (impaired relaxation).  2. Right ventricular systolic function is hyperdynamic. The right ventricular size is normal. There is mildly elevated pulmonary artery systolic pressure. The estimated right ventricular systolic pressure is 68.0 mmHg.  3. The mitral valve was not well visualized. Trivial mitral valve regurgitation.  4. The aortic valve was not well visualized. There is moderate calcification of the aortic valve. Aortic valve regurgitation is not visualized. Mild to moderate aortic valve sclerosis/calcification is present, without any evidence of aortic stenosis.  5. The inferior vena cava is normal in size with greater than 50% respiratory variability, suggesting right atrial pressure of 3 mmHg. Comparison(s): Changes from prior study are noted. 04/23/2020: LVEF 60-65%. FINDINGS  Left Ventricle: Left ventricular ejection fraction, by estimation, is >75%. The left ventricle has hyperdynamic function. The left ventricle has no regional wall motion abnormalities. The left ventricular internal cavity size was small. There is no left  ventricular hypertrophy. Left ventricular diastolic parameters are  consistent with Grade I diastolic dysfunction (impaired relaxation). Indeterminate filling pressures. Right Ventricle: The right ventricular size is normal. No increase in right ventricular wall thickness. Right ventricular systolic function is hyperdynamic. There is mildly elevated pulmonary artery systolic pressure. The tricuspid regurgitant velocity is 3.18 m/s, and with an assumed right atrial pressure of 3 mmHg, the estimated right ventricular systolic pressure is 77.8 mmHg. Left Atrium: Left atrial size was normal in size. Right Atrium: Right atrial size was normal in size. Pericardium: There is no evidence of  pericardial effusion. Mitral Valve: The mitral valve was not well visualized. Trivial mitral valve regurgitation. Tricuspid Valve: The tricuspid valve is grossly normal. Tricuspid valve regurgitation is mild. Aortic Valve: The aortic valve was not well visualized. There is moderate calcification of the aortic valve. Aortic valve regurgitation is not visualized. Mild to moderate aortic valve sclerosis/calcification is present, without any evidence of aortic stenosis. Pulmonic Valve: The pulmonic valve was normal in structure. Pulmonic valve regurgitation is not visualized. Aorta: The aortic arch was not well visualized. Venous: The inferior vena cava is normal in size with greater than 50% respiratory variability, suggesting right atrial pressure of 3 mmHg. IAS/Shunts: No atrial level shunt detected by color flow Doppler.  LEFT VENTRICLE PLAX 2D LVIDd:         3.60 cm  Diastology LVIDs:         1.70 cm  LV e' medial:    8.49 cm/s LV PW:         1.00 cm  LV E/e' medial:  8.3 LV IVS:        1.00 cm  LV e' lateral:   9.03 cm/s LVOT diam:     1.70 cm  LV E/e' lateral: 7.8 LVOT Area:     2.27 cm  RIGHT VENTRICLE         IVC TAPSE (M-mode): 1.6 cm  IVC diam: 1.50 cm LEFT ATRIUM             Index       RIGHT ATRIUM          Index LA diam:        2.70 cm 1.58 cm/m  RA Area:     8.87 cm LA Vol (A2C):   28.1 ml 16.42 ml/m RA Volume:   16.90 ml 9.87 ml/m LA Vol (A4C):   24.8 ml 14.49 ml/m LA Biplane Vol: 26.7 ml 15.60 ml/m   AORTA Ao Root diam: 2.50 cm MITRAL VALVE               TRICUSPID VALVE MV Area (PHT): 2.76 cm    TR Peak grad:   40.4 mmHg MV Decel Time: 275 msec    TR Vmax:        318.00 cm/s MV E velocity: 70.40 cm/s MV A velocity: 85.90 cm/s  SHUNTS MV E/A ratio:  0.82        Systemic Diam: 1.70 cm Lyman Bishop MD Electronically signed by Lyman Bishop MD Signature Date/Time: 08/15/2020/5:30:22 PM    Final     Assessment/Plan:   Active Problems:   Hypokalemia due to inadequate potassium  intake   Patient Summary: Beverly Howell is a 80 y.o. with a pertinent PMH of  HTN, HLD,CKD stage III,and recent COVID infection with CVA with residual difficulty swallowing, multifocal necrotizing pneumonia,right-sided hydropneumothorax,MSSA empyemas/pinsertion of chesttubes, who presented with a 1 month history of generalized weakness and developedworsening of her bilateral lower extremity weakness that resulted in fall space prior to  arrival and admitted for further work-up of generalized weakness and hypokalemia.   Generalized weakness, multifactorial Symptomatic hypotension Notes weakness has resolved at this time.  Able to ambulate with physical therapy without difficulty.  Continuing to assess hypotension as possible etiology for weakness.  A.m. cortisol was within normal limits.  Plan for ACTH test tomorrow morning.  Other possible etiologies of patient's hypotension are hypokalemia secondary to her dysphagia, her MAC's/PAC's causing decreased cardiac output.  We will continue to monitor, as well as give fluids if need be.  Despite etiology of hypotension, with a likely contributing to patient's episodes of weakness. -ACTH test tomorrow morning -Continue cardiac monitoring -Replete with fluids as needed  Supraventricular tachycardia, PAC Continues to have PACs.  Cardiology consulted who had no further recommendations, work-up sinus tachycardia.  Patient was multiple risk factors that may be contributing to her tachycardia.  She has remained in normal sinus rhythm with frequent PACs/MAC's.  Malnutrition may also be contributing.  Metabolic etiologies also being monitored, TSH negative.  Suspicion for pulmonary embolism, as patient is currently on Eliquis.  Do not see the benefit of performing a CT chest, as would not change management.  Patient without right heart strain on echo.  Low suspicion for infectious etiology at this time, patient without fever or significant leukocytosis  (white count did increase from 7.6-10.6.)  We will continue with daily CBCs.  Frequent PACs/MACs may be contributing to patient's hypotension -Continue telemetry monitoring -We will start low-dose metoprolol tartrate to see if she has improvement of tachycardia.  We will plan to discontinue if becomes more hypotensive.  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  Neurology believe this is a incidental finding, and do not believe that this is contributing to patient's weakness.  They suspect that patient is having syncopal episodes from hypoperfusion secondary to hypotension -Continue PT/OT -Weakness/hypotension has plan per above  Dysphagia,  Patient endorses improvement of swallowing, but denies episode of vomiting yesterday plan for GI consult. -GI consult, per Dr. Collene Mares.  Appreciate her recommendations -Nutrition consult  AKI: Creatinine at baseline -Daily BMPs  History of Covid: History of multifocal necrotizing pneumonia: History of right lower lung empyema and pneumothorax status post chest tube placement: No acute pulmonary symptoms at this time.  Patient seen by Dr. Tommy Medal on outpatient basis for antibiotics.  In his last note in January states to follow-up in 3 months to reassess.  Will discuss with him tomorrow to ascertain when to discontinue antibiotics. -Continue home Augmentin, will discuss with Dr. Tommy Medal -Follow-up with cardiothoracic surgery and infectious disease outpatient  History of bilateral lower extremity DVT: -Eliquis 5 mg twice daily  Hypertension: Holding antihypertensives at this time in setting of hypotension  Hyperlipidemia: Patient on lovastatin at home.  Will start on high intensity statin. -Resume home lovastatin 40 mg daily  Diet: Regular IVF:  LR infusion 100 mL/h we will discontinue at 1730 VTE: Eliquis Code: Full PT/OT recs: Home health TOC recs:  None at this time Family Update: Plan to update daughter [place] tomorrow  Dispo: Anticipated discharge in 3 days pending further work-up of her acute CVA.   Sanjuana Letters DO Internal Medicine Resident PGY-1 Pager [phone] Please contact the on call pager after 5 pm and on weekends at [phone].

## 2020-08-19 ENCOUNTER — Inpatient Hospital Stay: Payer: Self-pay

## 2020-08-19 DIAGNOSIS — R1310 Dysphagia, unspecified: Secondary | ICD-10-CM | POA: Diagnosis not present

## 2020-08-19 DIAGNOSIS — I6310 Cerebral infarction due to embolism of unspecified precerebral artery: Secondary | ICD-10-CM | POA: Diagnosis not present

## 2020-08-19 DIAGNOSIS — R531 Weakness: Secondary | ICD-10-CM | POA: Diagnosis not present

## 2020-08-19 DIAGNOSIS — I471 Supraventricular tachycardia: Secondary | ICD-10-CM | POA: Diagnosis not present

## 2020-08-19 LAB — BASIC METABOLIC PANEL
Anion gap: 6 (ref 5–15)
BUN: 10 mg/dL (ref 8–23)
CO2: 29 mmol/L (ref 22–32)
Calcium: 9.8 mg/dL (ref 8.9–10.3)
Chloride: 105 mmol/L (ref 98–111)
Creatinine, Ser: 0.93 mg/dL (ref 0.44–1.00)
GFR, Estimated: >60 mL/min (ref >60)
Glucose, Bld: 82 mg/dL (ref 70–99)
Potassium: 4.8 mmol/L (ref 3.5–5.1)
Sodium: 140 mmol/L (ref 135–145)

## 2020-08-19 LAB — CBC
HCT: 30.3 % — ABNORMAL LOW (ref 36.0–46.0)
Hemoglobin: 9.8 g/dL — ABNORMAL LOW (ref 12.0–15.0)
MCH: 29.3 pg (ref 26.0–34.0)
MCHC: 32.3 g/dL (ref 30.0–36.0)
MCV: 90.4 fL (ref 80.0–100.0)
Platelets: 371 10*3/uL (ref 150–400)
RBC: 3.35 MIL/uL — ABNORMAL LOW (ref 3.87–5.11)
RDW: 17.2 % — ABNORMAL HIGH (ref 11.5–15.5)
WBC: 9.4 10*3/uL (ref 4.0–10.5)
nRBC: 0.2 % (ref 0.0–0.2)

## 2020-08-19 LAB — HEPARIN LEVEL (UNFRACTIONATED)
Heparin Unfractionated: 1.14 IU/mL — ABNORMAL HIGH (ref 0.30–0.70)
Heparin Unfractionated: 0.68 IU/mL (ref 0.30–0.70)

## 2020-08-19 LAB — ACTH STIMULATION, 3 TIME POINTS
Cortisol, 30 Min: 20.7 ug/dL
Cortisol, 60 Min: 25.3 ug/dL
Cortisol, Base: 10.8 ug/dL

## 2020-08-19 LAB — GLUCOSE, CAPILLARY
Glucose-Capillary: 87 mg/dL (ref 70–99)
Glucose-Capillary: 80 mg/dL (ref 70–99)

## 2020-08-19 LAB — APTT: aPTT: 41 seconds — ABNORMAL HIGH (ref 24–36)

## 2020-08-19 MED ORDER — SODIUM CHLORIDE 0.9% FLUSH: 10.0000 mL | INTRAVENOUS | Status: DC | PRN

## 2020-08-19 MED ORDER — CHLORHEXIDINE GLUCONATE CLOTH 2 % EX PADS
6.0000 | MEDICATED_PAD | Freq: Every day | CUTANEOUS | Status: DC
  Administered 2020-08-19 – 2020-08-20 (×2): 6 via TOPICAL

## 2020-08-19 MED ORDER — SODIUM CHLORIDE 0.9% FLUSH
10.0000 mL | Freq: Two times a day (BID) | INTRAVENOUS | Status: DC
  Administered 2020-08-20: 10 mL

## 2020-08-19 NOTE — Progress Notes (Signed)
PT Cancellation Note  [name] MRN: [id number] DOB: [birth year]   Cancelled Treatment:    Reason Eval/Treat Not Completed: Patient at procedure or test/unavailable (getting PICC line placed).  Lillia Pauls, PT, DPT Acute Rehabilitation Services Pager [phone] Office [phone]    Norval Morton 08/19/2020, 3:50 PM

## 2020-08-19 NOTE — Progress Notes (Signed)
OT Cancellation Note  [name] MRN: [id number] DOB: 02-24-1941   Cancelled Treatment:    Reason Eval/Treat Not Completed: Fatigue/lethargy limiting ability to participate Pt pleasant but declining participation with skilled OT this date stating 'i'm hurting and have had a busy morning I really would rather do this tomorrow' despite continued encouragement for participation, pt continued to politely decline. OT will continue with effort schedule permitting  Seniyah Esker OTR/L acute rehab services Office: [phone]    08/19/2020, 9:15 AM

## 2020-08-19 NOTE — Progress Notes (Signed)
Subjective: She feels that her swallowing is improving since being in the hospital.  Objective: Vital signs in last 24 hours: Temp:  [97.5 F (36.4 C)-99.3 F (37.4 C)] 99.3 F (37.4 C) (03/08 0518) Pulse Rate:  [97-110] 100 (03/08 0518) Resp:  [16-23] 23 (03/08 0518) BP: (103-149)/(71-84) 149/73 (03/08 0518) SpO2:  [98 %-100 %] 99 % (03/08 0518) Last BM Date: 08/18/20  Intake/Output from previous day: 03/07 0701 - 03/08 0700 In: 1497.7 [P.O.:660; I.V.:837.7] Out: 275 [Urine:275] Intake/Output this shift: Total I/O In: 152.5 [P.O.:100; I.V.:52.5] Out: -   General appearance: alert and weak appearing GI: soft, non-tender; bowel sounds normal; no masses,  no organomegaly  Lab Results: Recent Labs    08/16/20 1059 08/17/20 0049 08/18/20 1029  WBC 7.1 7.6 10.6*  HGB 10.8* 9.1* 11.0*  HCT 32.8* 28.7* 35.5*  PLT 386 333 449*   BMET Recent Labs    08/16/20 1059 08/17/20 0049 08/18/20 1029  NA 137 137 139  K 4.2 3.7 5.0  CL 95* 97* 102  CO2 31 31 27   GLUCOSE 114* 101* 109*  BUN 6* 8 9  CREATININE 0.90 0.98 1.12*  CALCIUM 9.3 9.5 10.4*   LFT Recent Labs    08/18/20 1029  PROT 5.4*  ALBUMIN 2.8*  AST 36  ALT 21  ALKPHOS 71  BILITOT 0.7   PT/INR No results for input(s): LABPROT, INR in the last 72 hours. Hepatitis Panel No results for input(s): HEPBSAG, HCVAB, HEPAIGM, HEPBIGM in the last 72 hours. C-Diff No results for input(s): CDIFFTOX in the last 72 hours. Fecal Lactopherrin No results for input(s): FECLLACTOFRN in the last 72 hours.  Studies/Results: No results found.  Medications:  Scheduled: . amoxicillin-clavulanate  1 tablet Oral BID  . atorvastatin  40 mg Oral Daily  . feeding supplement  237 mL Oral TID BM  . metoprolol tartrate  12.5 mg Oral BID  . multivitamin with minerals  1 tablet Oral Daily   Continuous: . heparin 850 Units/hr (08/18/20 2130)    Assessment/Plan: 1) Dysphagia. 2) CVA. 3) Afib vs SVT 4) Bilateral LE  DVT.   The plan is to perform her EGD with dilation.  This may help with her dysphagia complaints as there may be a mild stricture or web.  Subjectively she feels that her swallowing is improving.  Plan: 1) EGD with dilation off of anticoagulation.  Planning for 08/21/2020.  LOS: 1 day   Beverly Howell D 08/19/2020, 6:56 AM

## 2020-08-19 NOTE — Progress Notes (Addendum)
HD#2 Subjective:  Overnight Events:   Beverly Howell resting in bed comfortably.  Does endorse little sleep due to multiple lab draws this AM.  Also endorses some right-sided flank pain at the site of her prior chest tube. Denies CP, shortness of breath, or any new swelling in her legs.   Objective:  Vital signs in last 24 hours:       Vitals:   08/15/20 0208 08/15/20 0500 08/15/20 0532 08/15/20 1439  BP: 129/70  119/60 112/70  Pulse: 68  63 75  Resp: _0 Temp: 98.1 F (36.7 C)  97.7 F (36.5 C) 97.8 F (36.6 C)  TempSrc: Oral  Oral Oral  SpO2: 100%  98% 100%  Weight:  68.1 kg     Supplemental O2: Room Air SpO2: 100 %   Physical Exam:  Constitutional: well-appearing HENT: normocephalic atraumatic Eyes: conjunctiva non-erythematous. Neck: supple Cardiovascular: Irregular rate and rhythm, no m/r/g Pulmonary/Chest: normal work of breathing on room air MSK: normal bulk and tone Neurological: alert & oriented x 3.  Psych: Normal mood      Filed Weights   08/15/20 0500  Weight: 68.1 kg     Imaging: MR BRAIN WO CONTRAST  Result Date: 08/15/2020 CLINICAL DATA:  weakness EXAM: MRI HEAD WITHOUT CONTRAST TECHNIQUE: Multiplanar, multiecho pulse sequences of the brain and surrounding structures were obtained without intravenous contrast. COMPARISON:  Concurrent MRV head.  04/22/2020 and prior. FINDINGS: Brain: 3 mm right centrum semiovale DWI hyperintensity. Slightly larger DWI hyperintensity involving the right corona radiata. Sequela of small remote right MCA territory insults, better demonstrated on prior exam. Small remote right parietal hemorrhages. No midline shift, ventriculomegaly or extra-axial fluid collection. No mass lesion. Chronic left cerebellar insult. Mild chronic microvascular ischemic changes. Vascular: Major intracranial flow voids are preserved at the skull base. Prominent flow voids on SWI involving the right corona radiata  and occipital region. Skull and upper cervical spine: Normal marrow signal. Sinuses/Orbits: Normal orbits. Pneumatized paranasal sinuses. Trace mastoid free fluid. Other: None. IMPRESSION: 3 mm subacute right centrum semiovale infarct. Slightly larger subacute infarct involving the right corona radiata. 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 Consider postcontrast MRI for better characterization. Multifocal remote chronic insults as detailed above. Mild chronic microvascular ischemic changes. These results will be called to the ordering clinician or representative by the Radiologist Assistant, and communication documented in the PACS or [employer]. Electronically Signed   By: Primitivo Gauze M.D.   On: 08/15/2020 13:02   DG Swallowing Func-Speech Pathology  Result Date: 08/15/2020 Objective Swallowing Evaluation: Type of Study: MBS-Modified Barium Swallow Study  [name] MRN: [id number] Date of Birth: 03-19-1941 Today's Date: 08/15/2020 Time: SLP Start Time (ACUTE ONLY): 1043 -SLP Stop Time (ACUTE ONLY): 1054 SLP Time Calculation (min) (ACUTE ONLY): 11 min Past Medical History: Past Medical History: Diagnosis Date . High cholesterol  . Hypertension  Past Surgical History: Past Surgical History: Procedure Laterality Date . HAND SURGERY   HPI: This is 80 year old female with a history of HTN, HLD, CKD stage III, and recent COVID infection with 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 Pt reports swallowing difficulty since November, but not immediately following stroke.  Pt was not seen by ST during admission for CVA, presumably because of no  dysphagia at that time.  Subjective: Pt awake, alert, pleasant, participative Assessment /  Plan / Recommendation CHL IP CLINICAL IMPRESSIONS 08/15/2020 Clinical Impression Pt presents with normal oropharyngeal swallow function.  There was no penetration or aspiration of any consistencies trialed.  There was no pharyngeal residue. Swallow was intermittently delayed to vallecula or pyriform sinuses with thin liquids, but remains WFL, especially considering pt's age.  There was stasis of contrast noted in upper esophagus with puree and solid textures.  Pt stated that she felt something was stuck or she would have to bring something up on several occaision and bolus trials were paused to allow this sensation to pass.  With pill simulation there was initially difficulty with oral transit at pill was expectorated, but pt was able to clear pill on second attempt when self-administered.  On esophageal sweep, tablet could not be located, but there was contrast material remaining in esophagus. Of note, there was mild narrowing of the pharynx, which seems to be related to cervical osteophytes which did not impair swallow function.  Recommend regular texture diet with thin liquid. Consider GI consult as pt's dysphagia symptoms are impacting her level of function and seem to be causing her distress. SLP Visit Diagnosis Dysphagia, unspecified (R13.10) Attention and concentration deficit following -- Frontal lobe and executive function deficit following -- Impact on safety and function No limitations   CHL IP TREATMENT RECOMMENDATION 08/15/2020 Treatment Recommendations No treatment recommended at this time   Prognosis 08/15/2020 Prognosis for Safe Diet Advancement (No Data) Barriers to Reach Goals -- Barriers/Prognosis Comment -- CHL IP DIET RECOMMENDATION 08/15/2020 SLP Diet Recommendations Regular solids;Thin liquid Liquid Administration via Cup;Straw Medication Administration Whole meds with liquid Compensations Slow rate;Small sips/bites Postural Changes Seated upright at 90 degrees;Remain semi-upright after after  feeds/meals (Comment)   CHL IP OTHER RECOMMENDATIONS 08/15/2020 Recommended Consults Consider GI evaluation;Consider esophageal assessment Oral Care Recommendations Oral care BID Other Recommendations --   CHL IP FOLLOW UP RECOMMENDATIONS 08/15/2020 Follow up Recommendations None   CHL IP FREQUENCY AND DURATION 08/15/2020 Speech Therapy Frequency (ACUTE ONLY) (No Data) Treatment Duration --      CHL IP ORAL PHASE 08/15/2020 Oral Phase Impaired Oral - Pudding Teaspoon -- Oral - Pudding Cup -- Oral - Honey Teaspoon -- Oral - Honey Cup -- Oral - Nectar Teaspoon -- Oral - Nectar Cup -- Oral - Nectar Straw -- Oral - Thin Teaspoon -- Oral - Thin Cup WFL Oral - Thin Straw WFL Oral - Puree WFL Oral - Mech Soft -- Oral - Regular Piecemeal swallowing Oral - Multi-Consistency -- Oral - Pill Reduced posterior propulsion Oral Phase - Comment --  CHL IP PHARYNGEAL PHASE 08/15/2020 Pharyngeal Phase Impaired Pharyngeal- Pudding Teaspoon -- Pharyngeal -- Pharyngeal- Pudding Cup -- Pharyngeal -- Pharyngeal- Honey Teaspoon -- Pharyngeal -- Pharyngeal- Honey Cup -- Pharyngeal -- Pharyngeal- Nectar Teaspoon -- Pharyngeal -- Pharyngeal- Nectar Cup -- Pharyngeal -- Pharyngeal- Nectar Straw -- Pharyngeal -- Pharyngeal- Thin Teaspoon -- Pharyngeal -- Pharyngeal- Thin Cup Delayed swallow initiation-vallecula;Delayed swallow initiation-pyriform sinuses Pharyngeal Material does not enter airway Pharyngeal- Thin Straw Delayed swallow initiation-vallecula;Delayed swallow initiation-pyriform sinuses Pharyngeal Material does not enter airway Pharyngeal- Puree WFL Pharyngeal Material does not enter airway Pharyngeal- Mechanical Soft -- Pharyngeal -- Pharyngeal- Regular WFL Pharyngeal Material does not enter airway Pharyngeal- Multi-consistency -- Pharyngeal -- Pharyngeal- Pill WFL Pharyngeal Material does not enter airway Pharyngeal Comment --  CHL IP CERVICAL ESOPHAGEAL PHASE 08/15/2020 Cervical Esophageal Phase Impaired Pudding Teaspoon -- Pudding Cup --  Honey Teaspoon --  Honey Cup -- Nectar Teaspoon -- Nectar Cup -- Nectar Straw -- Thin Teaspoon -- Thin Cup WFL Thin Straw WFL Puree (No Data) Mechanical Soft -- Regular (No Data) Multi-consistency -- Pill WFL Cervical Esophageal Comment -- Celedonio Savage, MA, CCC-SLP Acute Rehabilitation Services Office: [phone] 08/15/2020, 11:43 AM              MR MRV HEAD WO CM  Result Date: 08/15/2020 CLINICAL DATA:  Weakness of both lower extremities. EXAM: MR VENOGRAM OF THE HEAD WITHOUT CONTRAST TECHNIQUE: Angiographic images of the intracranial venous structures were obtained using MRV technique without intravenous contrast. COMPARISON:  04/22/2020 FINDINGS: Superior sagittal sinus is patent and normal. Both transverse sinuses are patent, with the left being larger than the right. Sigmoid sinuses and jugular veins are patent. Deep veins are patent. No visible superficial thrombosis. IMPRESSION: Normal intracranial venography. Electronically Signed   By: Nelson Chimes M.D.   On: 08/15/2020 11:40   ECHOCARDIOGRAM COMPLETE  Result Date: 08/15/2020    ECHOCARDIOGRAM REPORT   Patient Name:   Beverly Howell Date of Exam: 08/15/2020 Medical Rec #:  [id number]     Height:       63.0 in Accession #:    [id number]    Weight:       150.1 lb Date of Birth:  [birth year]      BSA:          1.712 m Patient Age:    15 years      BP:           112/70 mmHg Patient Gender: F             HR:           83 bpm. Exam Location:  Inpatient Procedure: 2D Echo Indications:    stroke  History:        Patient has prior history of Echocardiogram examinations, most                 recent 04/23/2020. Abnormal ECG, chronic kidney disease; Risk                 Factors:Hypertension and Dyslipidemia.  Sonographer:    Johny Chess Referring Phys: Kellen.Fila EMILY B MULLEN  Sonographer Comments: Image acquisition challenging due to respiratory motion. IMPRESSIONS  1. Left ventricular ejection fraction, by estimation, is >75%. The left ventricle has  hyperdynamic function. The left ventricle has no regional wall motion abnormalities. Left ventricular diastolic parameters are consistent with Grade I diastolic dysfunction (impaired relaxation).  2. Right ventricular systolic function is hyperdynamic. The right ventricular size is normal. There is mildly elevated pulmonary artery systolic pressure. The estimated right ventricular systolic pressure is 48.0 mmHg.  3. The mitral valve was not well visualized. Trivial mitral valve regurgitation.  4. The aortic valve was not well visualized. There is moderate calcification of the aortic valve. Aortic valve regurgitation is not visualized. Mild to moderate aortic valve sclerosis/calcification is present, without any evidence of aortic stenosis.  5. The inferior vena cava is normal in size with greater than 50% respiratory variability, suggesting right atrial pressure of 3 mmHg. Comparison(s): Changes from prior study are noted. 04/23/2020: LVEF 60-65%. FINDINGS  Left Ventricle: Left ventricular ejection fraction, by estimation, is >75%. The left ventricle has hyperdynamic function. The left ventricle has no regional wall motion abnormalities. The left ventricular internal cavity size was small. There is no left  ventricular hypertrophy. Left ventricular diastolic parameters are consistent with Grade I diastolic dysfunction (  impaired relaxation). Indeterminate filling pressures. Right Ventricle: The right ventricular size is normal. No increase in right ventricular wall thickness. Right ventricular systolic function is hyperdynamic. There is mildly elevated pulmonary artery systolic pressure. The tricuspid regurgitant velocity is 3.18 m/s, and with an assumed right atrial pressure of 3 mmHg, the estimated right ventricular systolic pressure is 72.0 mmHg. Left Atrium: Left atrial size was normal in size. Right Atrium: Right atrial size was normal in size. Pericardium: There is no evidence of pericardial effusion. Mitral  Valve: The mitral valve was not well visualized. Trivial mitral valve regurgitation. Tricuspid Valve: The tricuspid valve is grossly normal. Tricuspid valve regurgitation is mild. Aortic Valve: The aortic valve was not well visualized. There is moderate calcification of the aortic valve. Aortic valve regurgitation is not visualized. Mild to moderate aortic valve sclerosis/calcification is present, without any evidence of aortic stenosis. Pulmonic Valve: The pulmonic valve was normal in structure. Pulmonic valve regurgitation is not visualized. Aorta: The aortic arch was not well visualized. Venous: The inferior vena cava is normal in size with greater than 50% respiratory variability, suggesting right atrial pressure of 3 mmHg. IAS/Shunts: No atrial level shunt detected by color flow Doppler.  LEFT VENTRICLE PLAX 2D LVIDd:         3.60 cm  Diastology LVIDs:         1.70 cm  LV e' medial:    8.49 cm/s LV PW:         1.00 cm  LV E/e' medial:  8.3 LV IVS:        1.00 cm  LV e' lateral:   9.03 cm/s LVOT diam:     1.70 cm  LV E/e' lateral: 7.8 LVOT Area:     2.27 cm  RIGHT VENTRICLE         IVC TAPSE (M-mode): 1.6 cm  IVC diam: 1.50 cm LEFT ATRIUM             Index       RIGHT ATRIUM          Index LA diam:        2.70 cm 1.58 cm/m  RA Area:     8.87 cm LA Vol (A2C):   28.1 ml 16.42 ml/m RA Volume:   16.90 ml 9.87 ml/m LA Vol (A4C):   24.8 ml 14.49 ml/m LA Biplane Vol: 26.7 ml 15.60 ml/m   AORTA Ao Root diam: 2.50 cm MITRAL VALVE               TRICUSPID VALVE MV Area (PHT): 2.76 cm    TR Peak grad:   40.4 mmHg MV Decel Time: 275 msec    TR Vmax:        318.00 cm/s MV E velocity: 70.40 cm/s MV A velocity: 85.90 cm/s  SHUNTS MV E/A ratio:  0.82        Systemic Diam: 1.70 cm Lyman Bishop MD Electronically signed by Lyman Bishop MD Signature Date/Time: 08/15/2020/5:30:22 PM    Final     Assessment/Plan:   Active Problems:   Hypokalemia due to inadequate potassium intake   Patient Summary: Beverly Howell  is a 80 y.o. with a pertinent PMH of  HTN, HLD,CKD stage III,and recent COVID infection with CVA with residual difficulty swallowing, multifocal necrotizing pneumonia,right-sided hydropneumothorax,MSSA empyemas/pinsertion of chesttubes, who presented with a 1 month history of generalized weakness and developedworsening of her bilateral lower extremity weakness that resulted in fall space prior to arrival and admitted for further work-up  of generalized weakness and hypokalemia.   Generalized weakness, multifactorial Symptomatic hypotension Weakness has improved.  Hypotension seems to be resolved at this time, suspect multifactorial etiology including poor p.o. intake and PAC/MAC's.  Cardiac function has improved with metoprolol. -Pending baseline ACTH, difficult stick this a.m.  -ACT H stimulation test unremarkable -Continue cardiac monitoring -Replete with fluids as needed  Supraventricular tachycardia, PAC Patient continues to have some tachycardia however this has improved.  Started on low-dose metoprolol tartrate yesterday.  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 -Continue PT/OT -Weakness/hypotension has plan per above  Dysphagia,  Continues to endorse improvement of swallowing.  GI plan for EGD 3/10 -GI following, appreciate their recommendations -EGD 3/10, switch to heparin -Nutrition consult  AKI: Creatinine at baseline -Daily BMPs  History of Covid: History of multifocal necrotizing pneumonia: History of right lower lung empyema and pneumothorax status post chest tube placement: Patient with right-sided flank pain, near where chest tube was placed.  Surrounding area is not erythematous, warm, or increasingly tender to touch.  We will continue to monitor, consider imaging if no improvement. -Continue Augmentin, stop date  will be discussed with Dr. Tommy Medal in clinic later this month -Follow-up with cardiothoracic surgery and infectious disease outpatient  History of bilateral lower extremity DVT: -Transition to heparin in setting of EGD planned for 3/10  Hypertension: Holding home antihypertensives at this time in setting of hypotension  Diet: Regular IVF:  None  VTE: Eliquis Code: Full PT/OT recs: Home health TOC recs: None at this time Family Update:  Updated patient's daughter Kenney Houseman yesterday  Dispo: Anticipated discharge in 2-3 days pending further work-up of her swallowing and tachycardia/hypotension  [place] Resident PGY-1 Pager [phone] Please contact the on call pager after 5 pm and on weekends at [phone].

## 2020-08-19 NOTE — Progress Notes (Signed)
Peripherally Inserted Central Catheter Placement  The IV Nurse has discussed with the patient and/or Budd Freiermuth authorized to consent for the patient, the purpose of this procedure and the potential benefits and risks involved with this procedure.  The benefits include less needle sticks, lab draws from the catheter, and the patient may be discharged home with the catheter. Risks include, but not limited to, infection, bleeding, blood clot (thrombus formation), and puncture of an artery; nerve damage and irregular heartbeat and possibility to perform a PICC exchange if needed/ordered by physician.  Alternatives to this procedure were also discussed.  Bard Power PICC patient education guide, fact sheet on infection prevention and patient information card has been provided to patient /or left at bedside.    PICC Placement Documentation  PICC Single Lumen 08/19/20 PICC Left Brachial 40 cm 0 cm (Active)  Line Status Saline locked;Blood return noted 08/19/20 1550  Dressing Type Transparent;Securing device 08/19/20 1550  Dressing Status Clean;Dry;Intact 08/19/20 1550  Antimicrobial disc in place? Yes 08/19/20 1550  Dressing Change Due 08/26/20 08/19/20 1550       Romie Jumper 08/19/2020, 3:53 PM

## 2020-08-19 NOTE — Progress Notes (Signed)
[facility] consulted by pharmacy to obtain labs, including heparin level, which was due this am. Spoke with unit nurse who stated lab had tried several times to obtain lab specimen. Educated [employer] and pharmacy that [facility] cannot draw labs unless patient has a central line. Advised unit RN to contact physician.

## 2020-08-19 NOTE — Progress Notes (Signed)
ANTICOAGULATION CONSULT NOTE - Follow Up Consult  Pharmacy Consult for IV Heparin Indication: history of bilateral DVT (04/2020)  No Known Allergies  Patient Measurements: Height: 5\' 3"  (160 cm) Weight: 70.5 kg (155 lb 6.8 oz) IBW/kg (Calculated) : 52.4 Heparin Dosing Weight: 70 kg  Vital Signs: Temp: 99.3 F (37.4 C) (03/08 0518) Temp Source: Oral (03/08 0045) BP: 149/73 (03/08 0518) Pulse Rate: 100 (03/08 0518)  Labs: Recent Labs    08/17/20 0049 08/18/20 1029 08/18/20 1851 08/19/20 0635 08/19/20 0735  HGB 9.1* 11.0*  --   --  9.8*  HCT 28.7* 35.5*  --   --  30.3*  PLT 333 449*  --   --  371  APTT  --   --  31 41*  --   HEPARINUNFRC  --   --  >2.20* 1.14*  --   CREATININE 0.98 1.12*  --  0.93  --     Estimated Creatinine Clearance: 46.2 mL/min (by C-G formula based on SCr of 0.93 mg/dL).  Assessment: 80 years of age female transitioned from Eliquis to IV Heparin for history of bilateral DVTs for upcoming EGD with dilation on 08/21/20. NOte patient has recent CVA 04/2020 and then subacute CVA this admission as well as new SVT vs atrial fibrillation.  Last Eliquis dose was given on 3/7 at 10:00 AM - Utilizing aPTTs due to impact of Eliquis on heparin levels.   Intiail aPTT (delayed due to difficult to obtain per lab) was low at 41. Heparin level is elevated at 1.14 as expected with recent Eliquis but beginning to trend down.  SCr remains stable. H/H was up to 11/35.5 on 3/7. Platelets up at 449.  No overt bleeding noted.   Goal of Therapy:  Heparin level 0.3 to 0.5 units/ml aPTT 66-84 seconds Monitor platelets by anticoagulation protocol: Yes   Plan:  Increase IV Heparin (NO bolus) to 1050 units/hr.  Re-check aPTT in ~8 hrs.  Daily aPTT, Heparin level, and CBC while on therapy Monitor for signs or symptoms of bleeding.   5/7, PharmD, BCPS, BCCCP Clinical Pharmacist Please refer to [facility] for [facility] Pharmacy numbers 08/19/2020,10:14 AM

## 2020-08-20 DIAGNOSIS — I471 Supraventricular tachycardia: Secondary | ICD-10-CM | POA: Diagnosis not present

## 2020-08-20 DIAGNOSIS — I6310 Cerebral infarction due to embolism of unspecified precerebral artery: Secondary | ICD-10-CM | POA: Diagnosis not present

## 2020-08-20 DIAGNOSIS — R531 Weakness: Secondary | ICD-10-CM | POA: Diagnosis not present

## 2020-08-20 DIAGNOSIS — R1310 Dysphagia, unspecified: Secondary | ICD-10-CM | POA: Diagnosis not present

## 2020-08-20 LAB — BASIC METABOLIC PANEL
Anion gap: 7 (ref 5–15)
BUN: 12 mg/dL (ref 8–23)
CO2: 28 mmol/L (ref 22–32)
Calcium: 9.4 mg/dL (ref 8.9–10.3)
Chloride: 106 mmol/L (ref 98–111)
Creatinine, Ser: 0.92 mg/dL (ref 0.44–1.00)
GFR, Estimated: >60 mL/min (ref >60)
Glucose, Bld: 86 mg/dL (ref 70–99)
Potassium: 4.5 mmol/L (ref 3.5–5.1)
Sodium: 141 mmol/L (ref 135–145)

## 2020-08-20 LAB — CBC
HCT: 26.2 % — ABNORMAL LOW (ref 36.0–46.0)
Hemoglobin: 8.2 g/dL — ABNORMAL LOW (ref 12.0–15.0)
MCH: 29.3 pg (ref 26.0–34.0)
MCHC: 31.3 g/dL (ref 30.0–36.0)
MCV: 93.6 fL (ref 80.0–100.0)
Platelets: 371 10*3/uL (ref 150–400)
RBC: 2.8 MIL/uL — ABNORMAL LOW (ref 3.87–5.11)
RDW: 18.3 % — ABNORMAL HIGH (ref 11.5–15.5)
WBC: 8.7 10*3/uL (ref 4.0–10.5)
nRBC: 0 % (ref 0.0–0.2)

## 2020-08-20 LAB — ACTH: C206 ACTH: 15.7 pg/mL (ref 7.2–63.3)

## 2020-08-20 LAB — HEPARIN LEVEL (UNFRACTIONATED)
Heparin Unfractionated: 0.92 IU/mL — ABNORMAL HIGH (ref 0.30–0.70)
Heparin Unfractionated: 0.92 IU/mL — ABNORMAL HIGH (ref 0.30–0.70)
Heparin Unfractionated: 0.35 IU/mL (ref 0.30–0.70)

## 2020-08-20 LAB — GLUCOSE, CAPILLARY: Glucose-Capillary: 121 mg/dL — ABNORMAL HIGH (ref 70–99)

## 2020-08-20 LAB — APTT
aPTT: 122 seconds — ABNORMAL HIGH (ref 24–36)
aPTT: 167 seconds (ref 24–36)
aPTT: 144 seconds — ABNORMAL HIGH (ref 24–36)
aPTT: 63 seconds — ABNORMAL HIGH (ref 24–36)

## 2020-08-20 MED ORDER — HEPARIN (PORCINE) 25000 UT/250ML-% IV SOLN
800.0000 [IU]/h | INTRAVENOUS | Status: DC
  Administered 2020-08-20: 750 [IU]/h via INTRAVENOUS
  Filled 2020-08-20 (×2): qty 250

## 2020-08-20 MED ORDER — SODIUM CHLORIDE 0.9 % IV SOLN: INTRAVENOUS | Status: DC

## 2020-08-20 NOTE — Progress Notes (Signed)
Occupational Therapy Treatment [name] MRN: [id number] DOB: 1941/04/14 Today's Date: 08/20/2020    History of present illness Pt is a 80 year old woman admitted from home with her daughter with weakness experienced at the bottom of the stairs when she was going to the doctor. +hypokalemia.  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 PMH: recent COVID, CVA, necrotizing PNA, T PTX, HTN, HLD, CKD III, dysphagia, MSSA empyema, B LE DVT. Pt developed tachycardia 3/6 during hospitalization.   OT comments  Pt readily willing to get OOB with OT. Ambulated to bathroom for toileting and to sink for grooming. Remained up in chair at end of session. Pt appearing dyspneic, RN in room and aware. Provided IS and encouraged OOB.   Follow Up Recommendations  No OT follow up    Equipment Recommendations  None recommended by OT    Recommendations for Other Services      Precautions / Restrictions Precautions Precautions: Fall       Mobility Bed Mobility Overal bed mobility: Modified Independent             General bed mobility comments: increased time, no assist or use of rail    Transfers Overall transfer level: Needs assistance Equipment used: Rolling walker (2 wheeled) Transfers: Sit to/from Stand Sit to Stand: Min guard         General transfer comment: min guard for safety, lines    Balance Overall balance assessment: Needs assistance   Sitting balance-Leahy Scale: Good       Standing balance-Leahy Scale: Poor Standing balance comment: reliant on BUE support                           ADL either performed or assessed with clinical judgement   ADL Overall ADL's : Needs assistance/impaired Eating/Feeding: Independent;Sitting   Grooming: Wash/dry hands;Standing;Min guard           Upper Body Dressing : Set up;Sitting       Toilet Transfer: Min  guard;Ambulation;RW;BSC   Toileting- Architect and Hygiene: Min guard;Sit to/from stand       Functional mobility during ADLs: Min guard;Rolling walker General ADL Comments: pt appearing dyspneic, RN in room, got pt an incentive spirometer and encouraged OOB, need to spot check HR and 02 with mobility     Vision       Perception     Praxis      Cognition Arousal/Alertness: Awake/alert Behavior During Therapy: WFL for tasks assessed/performed Overall Cognitive Status: No family/caregiver present to determine baseline cognitive functioning                                 General Comments: Pt is aware her MRI revealed subacute strokes, not acute.        Exercises     Shoulder Instructions       General Comments      Pertinent Vitals/ Pain       Pain Assessment: No/denies pain  Home Living                                          Prior Functioning/Environment  Frequency  Min 2X/week        Progress Toward Goals  OT Goals(current goals can now be found in the care plan section)  Progress towards OT goals: Progressing toward goals  Acute Rehab OT Goals Patient Stated Goal: home OT Goal Formulation: With patient Time For Goal Achievement: 08/29/20 Potential to Achieve Goals: Good  Plan Discharge plan remains appropriate    Co-evaluation                 AM-PAC OT "6 Clicks" Daily Activity     Outcome Measure   Help from another person eating meals?: None Help from another person taking care of personal grooming?: A Little Help from another person toileting, which includes using toliet, bedpan, or urinal?: A Little Help from another person bathing (including washing, rinsing, drying)?: A Little Help from another person to put on and taking off regular upper body clothing?: None Help from another person to put on and taking off regular lower body clothing?: A Little 6 Click Score:  20    End of Session Equipment Utilized During Treatment: Gait belt;Rolling walker  OT Visit Diagnosis: Unsteadiness on feet (R26.81);Other abnormalities of gait and mobility (R26.89)   Activity Tolerance Patient tolerated treatment well   Patient Left in chair;with call bell/phone within reach;with chair alarm set   Nurse Communication Other (comment) (aware pt seems dyspneic)        Time: 0938-1829 OT Time Calculation (min): 20 min  Charges: OT General Charges $OT Visit: 1 Visit OT Treatments $Self Care/Home Management : 8-22 mins  Martie Round, OTR/L Acute Rehabilitation Services Pager: [phone] Office: [phone]   Evern Bio 08/20/2020, 11:16 AM

## 2020-08-20 NOTE — Progress Notes (Signed)
UNASSIGNED PATIENT Subjective: Since I last evaluated the patient, she seems to be doing fairly well.  She denies having any abdominal pain, nausea or vomiting or vomiting. She is awaiting to have an EGD tomorrow for possible dilatation as she has had an abnormal barium swallow.  Objective: Vital signs in last 24 hours: Temp:  [97.6 F (36.4 C)-98.1 F (36.7 C)] 98 F (36.7 C) (03/09 0526) Pulse Rate:  [97-106] 97 (03/09 0526) Resp:  [16-18] 18 (03/09 0526) BP: (120-136)/(72-79) 120/77 (03/09 0526) SpO2:  [92 %-100 %] 97 % (03/09 0526) Last BM Date: 08/19/20  Intake/Output from previous day: 03/08 0701 - 03/09 0700 In: 474 [P.O.:474] Out: 400 [Urine:400] Intake/Output this shift: No intake/output data recorded.  General appearance: alert, cooperative, appears stated age and no distress Resp: clear to auscultation bilaterally Cardio: regular rate and rhythm, S1, S2 normal, no murmur, click, rub or gallop GI: soft, non-tender; bowel sounds normal; no masses,  no organomegaly Extremities: extremities normal, atraumatic, no cyanosis or edema  Lab Results: Recent Labs    08/18/20 1029 08/19/20 0735 08/20/20 0404  WBC 10.6* 9.4 8.7  HGB 11.0* 9.8* 8.2*  HCT 35.5* 30.3* 26.2*  PLT 449* 371 371   BMET Recent Labs    08/18/20 1029 08/19/20 0635 08/20/20 0404  NA 139 140 141  K 5.0 4.8 4.5  CL 102 105 106  CO2 27 29 28  GLUCOSE 109* 82 86  BUN 9 10 12  CREATININE 1.12* 0.93 0.92  CALCIUM 10.4* 9.8 9.4   LFT Recent Labs    08/18/20 1029  PROT 5.4*  ALBUMIN 2.8*  AST 36  ALT 21  ALKPHOS 71  BILITOT 0.7   Studies/Results: US EKG SITE RITE  Result Date: 08/19/2020 If Site Rite image not attached, placement could not be confirmed due to current cardiac rhythm.  Medications: I have reviewed the patient's current medications.  Assessment/Plan: 1) Dysphagia/abnormal weight loss-following stroke and complicated prolonged illness due to Covid, with necrotizing  pneumonia and multiple strokes-EGD is planned for tomorrow.  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 3) Supraventricular tachycardia/PACs versus A. Fib. 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 5) Hypertension/Hyperlipidemia. 6) Hypokalemia. 7) Covid infection in November 2021 complicated by stroke and bilateral DVTs and MSSA empyema and multifocal necrotizing pneumonitis. 8) Iron deficiency anemia. 9) History of colonic polyps followed by Dr. [facility].   LOS: 2 days   Jyothi Mann 08/20/2020, 8:40 AM  

## 2020-08-20 NOTE — Progress Notes (Addendum)
ANTICOAGULATION CONSULT NOTE - Follow Up Consult  Pharmacy Consult for heparin Indication: recent h/o DVT in setting of subacute CVA  Labs: Recent Labs    08/18/20 1029 08/18/20 1851 08/19/20 0635 08/19/20 0735 08/19/20 2211 08/20/20 0404 08/20/20 1157  HGB 11.0*  --   --  9.8*  --  8.2*  --   HCT 35.5*  --   --  30.3*  --  26.2*  --   PLT 449*  --   --  371  --  371  --   APTT  --    < > 41*  --  122*  --  167*  HEPARINUNFRC  --    < > 1.14*  --  0.68  --  0.92*  CREATININE 1.12*  --  0.93  --   --  0.92  --    < > = values in this interval not displayed.    Assessment: 80yo female supratherapeutic on heparin after rate change; no gtt issues or signs of bleeding per RN.  AM HL and aPTT finally resulted appear to be contaminated with heparin. Heparin running via PICC and levels drawn via PICC by IV team. No issue with the infusion and no over bleeding noted by RN. Hgb has drifted down over last 2 days.   Goal of Therapy:  Heparin level 0.3-0.5 units/ml aPTT 66-85 seconds   Plan:  STAT redraw to attempt to rule out contamination of levels before decrease rate.   Addendum: STAT redraw remains elevated.  Hold IV Heparin x1 hour then resume at lower rate of 750 units/hr. Will recheck aPTT and HL in ~6-8hrs after restart of infusion.   Link Snuffer, PharmD, BCPS, BCCCP Clinical Pharmacist Please refer to [facility] for [facility] numbers 08/20/2020,1:47 PM

## 2020-08-20 NOTE — Progress Notes (Signed)
ANTICOAGULATION CONSULT NOTE - Follow Up Consult  Pharmacy Consult for heparin Indication: recent h/o DVT in setting of subacute CVA  Labs: Recent Labs    08/17/20 0049 08/18/20 1029 08/18/20 1851 08/19/20 0635 08/19/20 0735 08/19/20 2211  HGB 9.1* 11.0*  --   --  9.8*  --   HCT 28.7* 35.5*  --   --  30.3*  --   PLT 333 449*  --   --  371  --   APTT  --   --  31 41*  --  122*  HEPARINUNFRC  --   --  >2.20* 1.14*  --  0.68  CREATININE 0.98 1.12*  --  0.93  --   --     Assessment: 80yo female supratherapeutic on heparin after rate change; no gtt issues or signs of bleeding per RN.  Goal of Therapy:  Heparin level 0.3-0.5 units/ml aPTT 66-85 seconds   Plan:  Will decrease heparin gtt by 1-2 units/kg/hr to 950 units/hr and check level/PTT in 8 hours.    Vernard Gambles, PharmD, BCPS  08/20/2020,12:44 AM

## 2020-08-20 NOTE — Progress Notes (Signed)
HD#2 Subjective:  Overnight Events:  PICC placed  Patient resting in bed comfortably and overall states she is doing well.  Denies any new episodes of weakness.  Breakfast tray at bedside, full plate eaten.  Objective:  Vital signs in last 24 hours:       Vitals:   08/15/20 0208 08/15/20 0500 08/15/20 0532 08/15/20 1439  BP: 129/70  119/60 112/70  Pulse: 68  63 75  Resp: _0 Temp: 98.1 F (36.7 C)  97.7 F (36.5 C) 97.8 F (36.6 C)  TempSrc: Oral  Oral Oral  SpO2: 100%  98% 100%  Weight:  68.1 kg     Supplemental O2: Room Air SpO2: 100 %   Physical Exam:  Constitutional: well-appearing HENT: normocephalic atraumatic Eyes: conjunctiva non-erythematous. Neck: supple Cardiovascular: Irregular rate and rhythm, no m/r/g Pulmonary/Chest: normal work of breathing on room air MSK: normal bulk and tone Neurological: alert & oriented x 3.  Psych: Normal mood      Filed Weights   08/15/20 0500  Weight: 68.1 kg     Imaging: MR BRAIN WO CONTRAST  Result Date: 08/15/2020 CLINICAL DATA:  weakness EXAM: MRI HEAD WITHOUT CONTRAST TECHNIQUE: Multiplanar, multiecho pulse sequences of the brain and surrounding structures were obtained without intravenous contrast. COMPARISON:  Concurrent MRV head.  04/22/2020 and prior. FINDINGS: Brain: 3 mm right centrum semiovale DWI hyperintensity. Slightly larger DWI hyperintensity involving the right corona radiata. Sequela of small remote right MCA territory insults, better demonstrated on prior exam. Small remote right parietal hemorrhages. No midline shift, ventriculomegaly or extra-axial fluid collection. No mass lesion. Chronic left cerebellar insult. Mild chronic microvascular ischemic changes. Vascular: Major intracranial flow voids are preserved at the skull base. Prominent flow voids on SWI involving the right corona radiata and occipital region. Skull and upper cervical spine: Normal marrow signal.  Sinuses/Orbits: Normal orbits. Pneumatized paranasal sinuses. Trace mastoid free fluid. Other: None. IMPRESSION: 3 mm subacute right centrum semiovale infarct. Slightly larger subacute infarct involving the right corona radiata. 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 Consider postcontrast MRI for better characterization. Multifocal remote chronic insults as detailed above. Mild chronic microvascular ischemic changes. These results will be called to the ordering clinician or representative by the Radiologist Assistant, and communication documented in the PACS or [employer]. Electronically Signed   By: Primitivo Gauze M.D.   On: 08/15/2020 13:02   DG Swallowing Func-Speech Pathology  Result Date: 08/15/2020 Objective Swallowing Evaluation: Type of Study: MBS-Modified Barium Swallow Study  [name] MRN: [id number] Date of Birth: 08-13-1940 Today's Date: 08/15/2020 Time: SLP Start Time (ACUTE ONLY): 1043 -SLP Stop Time (ACUTE ONLY): 1054 SLP Time Calculation (min) (ACUTE ONLY): 11 min Past Medical History: Past Medical History: Diagnosis Date . High cholesterol  . Hypertension  Past Surgical History: Past Surgical History: Procedure Laterality Date . HAND SURGERY   HPI: This is 80 year old female with a history of HTN, HLD, CKD stage III, and recent COVID infection with 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 Pt reports swallowing difficulty since November, but not immediately following stroke.  Pt was not seen by ST during admission for CVA, presumably because of no dysphagia at that time.  Subjective: Pt awake, alert, pleasant, participative Assessment / Plan / Recommendation CHL  IP CLINICAL IMPRESSIONS 08/15/2020 Clinical Impression  Pt presents with normal oropharyngeal swallow function.  There was no penetration or aspiration of any consistencies trialed.  There was no pharyngeal residue. Swallow was intermittently delayed to vallecula or pyriform sinuses with thin liquids, but remains WFL, especially considering pt's age.  There was stasis of contrast noted in upper esophagus with puree and solid textures.  Pt stated that she felt something was stuck or she would have to bring something up on several occaision and bolus trials were paused to allow this sensation to pass.  With pill simulation there was initially difficulty with oral transit at pill was expectorated, but pt was able to clear pill on second attempt when self-administered.  On esophageal sweep, tablet could not be located, but there was contrast material remaining in esophagus. Of note, there was mild narrowing of the pharynx, which seems to be related to cervical osteophytes which did not impair swallow function.  Recommend regular texture diet with thin liquid. Consider GI consult as pt's dysphagia symptoms are impacting her level of function and seem to be causing her distress. SLP Visit Diagnosis Dysphagia, unspecified (R13.10) Attention and concentration deficit following -- Frontal lobe and executive function deficit following -- Impact on safety and function No limitations   CHL IP TREATMENT RECOMMENDATION 08/15/2020 Treatment Recommendations No treatment recommended at this time   Prognosis 08/15/2020 Prognosis for Safe Diet Advancement (No Data) Barriers to Reach Goals -- Barriers/Prognosis Comment -- CHL IP DIET RECOMMENDATION 08/15/2020 SLP Diet Recommendations Regular solids;Thin liquid Liquid Administration via Cup;Straw Medication Administration Whole meds with liquid Compensations Slow rate;Small sips/bites Postural Changes Seated upright at 90 degrees;Remain semi-upright after after feeds/meals (Comment)   CHL IP OTHER RECOMMENDATIONS 08/15/2020 Recommended Consults  Consider GI evaluation;Consider esophageal assessment Oral Care Recommendations Oral care BID Other Recommendations --   CHL IP FOLLOW UP RECOMMENDATIONS 08/15/2020 Follow up Recommendations None   CHL IP FREQUENCY AND DURATION 08/15/2020 Speech Therapy Frequency (ACUTE ONLY) (No Data) Treatment Duration --      CHL IP ORAL PHASE 08/15/2020 Oral Phase Impaired Oral - Pudding Teaspoon -- Oral - Pudding Cup -- Oral - Honey Teaspoon -- Oral - Honey Cup -- Oral - Nectar Teaspoon -- Oral - Nectar Cup -- Oral - Nectar Straw -- Oral - Thin Teaspoon -- Oral - Thin Cup WFL Oral - Thin Straw WFL Oral - Puree WFL Oral - Mech Soft -- Oral - Regular Piecemeal swallowing Oral - Multi-Consistency -- Oral - Pill Reduced posterior propulsion Oral Phase - Comment --  CHL IP PHARYNGEAL PHASE 08/15/2020 Pharyngeal Phase Impaired Pharyngeal- Pudding Teaspoon -- Pharyngeal -- Pharyngeal- Pudding Cup -- Pharyngeal -- Pharyngeal- Honey Teaspoon -- Pharyngeal -- Pharyngeal- Honey Cup -- Pharyngeal -- Pharyngeal- Nectar Teaspoon -- Pharyngeal -- Pharyngeal- Nectar Cup -- Pharyngeal -- Pharyngeal- Nectar Straw -- Pharyngeal -- Pharyngeal- Thin Teaspoon -- Pharyngeal -- Pharyngeal- Thin Cup Delayed swallow initiation-vallecula;Delayed swallow initiation-pyriform sinuses Pharyngeal Material does not enter airway Pharyngeal- Thin Straw Delayed swallow initiation-vallecula;Delayed swallow initiation-pyriform sinuses Pharyngeal Material does not enter airway Pharyngeal- Puree WFL Pharyngeal Material does not enter airway Pharyngeal- Mechanical Soft -- Pharyngeal -- Pharyngeal- Regular WFL Pharyngeal Material does not enter airway Pharyngeal- Multi-consistency -- Pharyngeal -- Pharyngeal- Pill WFL Pharyngeal Material does not enter airway Pharyngeal Comment --  CHL IP CERVICAL ESOPHAGEAL PHASE 08/15/2020 Cervical Esophageal Phase Impaired Pudding Teaspoon -- Pudding Cup -- Honey Teaspoon -- Honey Cup -- Nectar Teaspoon -- Nectar Cup -- Nectar Straw -- Thin  Teaspoon -- Thin  Cup [facility] Thin Straw WFL Puree (No Data) Mechanical Soft -- Regular (No Data) Multi-consistency -- Pill WFL Cervical Esophageal Comment -- Celedonio Savage, MA, CCC-SLP Acute Rehabilitation Services Office: [phone] 08/15/2020, 11:43 AM              MR MRV HEAD WO CM  Result Date: 08/15/2020 CLINICAL DATA:  Weakness of both lower extremities. EXAM: MR VENOGRAM OF THE HEAD WITHOUT CONTRAST TECHNIQUE: Angiographic images of the intracranial venous structures were obtained using MRV technique without intravenous contrast. COMPARISON:  04/22/2020 FINDINGS: Superior sagittal sinus is patent and normal. Both transverse sinuses are patent, with the left being larger than the right. Sigmoid sinuses and jugular veins are patent. Deep veins are patent. No visible superficial thrombosis. IMPRESSION: Normal intracranial venography. Electronically Signed   By: Nelson Chimes M.D.   On: 08/15/2020 11:40   ECHOCARDIOGRAM COMPLETE  Result Date: 08/15/2020    ECHOCARDIOGRAM REPORT   Patient Name:   Beverly Howell Date of Exam: 08/15/2020 Medical Rec #:  [id number]     Height:       63.0 in Accession #:    [id number]    Weight:       150.1 lb Date of Birth:  1940-07-03      BSA:          1.712 m Patient Age:    7 years      BP:           112/70 mmHg Patient Gender: F             HR:           83 bpm. Exam Location:  Inpatient Procedure: 2D Echo Indications:    stroke  History:        Patient has prior history of Echocardiogram examinations, most                 recent 04/23/2020. Abnormal ECG, chronic kidney disease; Risk                 Factors:Hypertension and Dyslipidemia.  Sonographer:    Johny Chess Referring Phys: Kellen.Fila EMILY B MULLEN  Sonographer Comments: Image acquisition challenging due to respiratory motion. IMPRESSIONS  1. Left ventricular ejection fraction, by estimation, is >75%. The left ventricle has hyperdynamic function. The left ventricle has no regional wall motion abnormalities. Left  ventricular diastolic parameters are consistent with Grade I diastolic dysfunction (impaired relaxation).  2. Right ventricular systolic function is hyperdynamic. The right ventricular size is normal. There is mildly elevated pulmonary artery systolic pressure. The estimated right ventricular systolic pressure is 69.4 mmHg.  3. The mitral valve was not well visualized. Trivial mitral valve regurgitation.  4. The aortic valve was not well visualized. There is moderate calcification of the aortic valve. Aortic valve regurgitation is not visualized. Mild to moderate aortic valve sclerosis/calcification is present, without any evidence of aortic stenosis.  5. The inferior vena cava is normal in size with greater than 50% respiratory variability, suggesting right atrial pressure of 3 mmHg. Comparison(s): Changes from prior study are noted. 04/23/2020: LVEF 60-65%. FINDINGS  Left Ventricle: Left ventricular ejection fraction, by estimation, is >75%. The left ventricle has hyperdynamic function. The left ventricle has no regional wall motion abnormalities. The left ventricular internal cavity size was small. There is no left  ventricular hypertrophy. Left ventricular diastolic parameters are consistent with Grade I diastolic dysfunction (impaired relaxation). Indeterminate filling pressures. Right Ventricle: The right ventricular size is normal. No increase in  right ventricular wall thickness. Right ventricular systolic function is hyperdynamic. There is mildly elevated pulmonary artery systolic pressure. The tricuspid regurgitant velocity is 3.18 m/s, and with an assumed right atrial pressure of 3 mmHg, the estimated right ventricular systolic pressure is 46.2 mmHg. Left Atrium: Left atrial size was normal in size. Right Atrium: Right atrial size was normal in size. Pericardium: There is no evidence of pericardial effusion. Mitral Valve: The mitral valve was not well visualized. Trivial mitral valve regurgitation.  Tricuspid Valve: The tricuspid valve is grossly normal. Tricuspid valve regurgitation is mild. Aortic Valve: The aortic valve was not well visualized. There is moderate calcification of the aortic valve. Aortic valve regurgitation is not visualized. Mild to moderate aortic valve sclerosis/calcification is present, without any evidence of aortic stenosis. Pulmonic Valve: The pulmonic valve was normal in structure. Pulmonic valve regurgitation is not visualized. Aorta: The aortic arch was not well visualized. Venous: The inferior vena cava is normal in size with greater than 50% respiratory variability, suggesting right atrial pressure of 3 mmHg. IAS/Shunts: No atrial level shunt detected by color flow Doppler.  LEFT VENTRICLE PLAX 2D LVIDd:         3.60 cm  Diastology LVIDs:         1.70 cm  LV e' medial:    8.49 cm/s LV PW:         1.00 cm  LV E/e' medial:  8.3 LV IVS:        1.00 cm  LV e' lateral:   9.03 cm/s LVOT diam:     1.70 cm  LV E/e' lateral: 7.8 LVOT Area:     2.27 cm  RIGHT VENTRICLE         IVC TAPSE (M-mode): 1.6 cm  IVC diam: 1.50 cm LEFT ATRIUM             Index       RIGHT ATRIUM          Index LA diam:        2.70 cm 1.58 cm/m  RA Area:     8.87 cm LA Vol (A2C):   28.1 ml 16.42 ml/m RA Volume:   16.90 ml 9.87 ml/m LA Vol (A4C):   24.8 ml 14.49 ml/m LA Biplane Vol: 26.7 ml 15.60 ml/m   AORTA Ao Root diam: 2.50 cm MITRAL VALVE               TRICUSPID VALVE MV Area (PHT): 2.76 cm    TR Peak grad:   40.4 mmHg MV Decel Time: 275 msec    TR Vmax:        318.00 cm/s MV E velocity: 70.40 cm/s MV A velocity: 85.90 cm/s  SHUNTS MV E/A ratio:  0.82        Systemic Diam: 1.70 cm Lyman Bishop MD Electronically signed by Lyman Bishop MD Signature Date/Time: 08/15/2020/5:30:22 PM    Final     Assessment/Plan:   Active Problems:   Hypokalemia due to inadequate potassium intake   Patient Summary: Beverly Howell is a 81 y.o. with a pertinent PMH of  HTN, HLD,CKD stage III,and recent COVID  infection with CVA with residual difficulty swallowing, multifocal necrotizing pneumonia,right-sided hydropneumothorax,MSSA empyemas/pinsertion of chesttubes, who presented with a 1 month history of generalized weakness and developedworsening of her bilateral lower extremity weakness that resulted in fall space prior to arrival and admitted for further work-up of generalized weakness and hypokalemia.   Generalized weakness, multifactorial Symptomatic hypotension Weakness has improved.  Hypotension seems to be resolved at this time, suspect multifactorial etiology including poor p.o. intake and PAC/MAC's.  Cardiac function has improved with metoprolol. -Hypotension resolved -ACTH stimulation test unremarkable, baseline ACTH unremarkable -Continue cardiac monitoring -Replete with fluids as needed  Supraventricular tachycardia, PAC Tachycardia has improved, with episodes of regular rate.  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 -Continue PT/OT -Weakness/hypotension has plan per above  Dysphagia,  Plan for EGD tomorrow per GI.  N.p.o. at midnight. -GI following, appreciate their recommendations -EGD 3/10, switch to heparin -Nutrition consult  AKI: Creatinine at baseline -Daily BMPs  History of Covid: History of multifocal necrotizing pneumonia: History of right lower lung empyema and pneumothorax status post chest tube placement: Right-sided flank plain new chest tube placement has resolved. -Continue Augmentin, stop date will be discussed with Dr. Tommy Medal in clinic later this month -Follow-up with cardiothoracic surgery and infectious disease outpatient  History of bilateral lower extremity DVT: -Supratherapeutic levels of heparin, pharmacy following.  Will adjust levels  Hypertension: Holding home antihypertensives at  this time in setting of hypotension  Diet: Regular, n.p.o. at midnight IVF:  None  VTE: Eliquis Code: Full PT/OT recs: Home health TOC recs: None at this time Family Update:  Spoke with daughter Kenney Houseman today.  Updated on plan for EGD tomorrow.  Dispo: Anticipated discharge in 1-2 days pending further work-up of her swallowing and tachycardia/hypotension  [facility] Resident PGY-1 Pager [phone] Please contact the on call pager after 5 pm and on weekends at [phone].

## 2020-08-20 NOTE — Progress Notes (Signed)
ANTICOAGULATION CONSULT NOTE - Follow Up Consult  Pharmacy Consult for heparin Indication: recent h/o DVT in setting of subacute CVA  Labs: Recent Labs    08/18/20 1029 08/18/20 1851 08/19/20 0635 08/19/20 0735 08/19/20 2211 08/20/20 0404 08/20/20 1157 08/20/20 1347 08/20/20 1423 08/20/20 2220  HGB 11.0*  --   --  9.8*  --  8.2*  --   --   --   --   HCT 35.5*  --   --  30.3*  --  26.2*  --   --   --   --   PLT 449*  --   --  371  --  371  --   --   --   --   APTT  --    < > 41*  --    < >  --  167* 144*  --  63*  HEPARINUNFRC  --    < > 1.14*  --    < >  --  0.92*  --  0.92* 0.35  CREATININE 1.12*  --  0.93  --   --  0.92  --   --   --   --    < > = values in this interval not displayed.    Assessment: 80yo female supratherapeutic on heparin after rate change; no gtt issues or signs of bleeding per RN.  AM HL and aPTT finally resulted appear to be contaminated with heparin. Heparin running via PICC and levels drawn via PICC by IV team. No issue with the infusion and no over bleeding noted by RN. Hgb has drifted down over last 2 days.   3/9 AM update:  APTT is low tonight Should be able to start using heparin level only 3/10  Goal of Therapy:  Heparin level 0.3-0.5 units/ml aPTT 66-85 seconds   Plan:  Inc heparin slightly to 800 units/hr Re-check heparin level and aPTT in 8 hours  Abran Duke, PharmD, BCPS Clinical Pharmacist Phone: [phone]

## 2020-08-20 NOTE — H&P (View-Only) (Signed)
UNASSIGNED PATIENT Subjective: Since I last evaluated the patient, she seems to be doing fairly well.  She denies having any abdominal pain, nausea or vomiting or vomiting. She is awaiting to have an EGD tomorrow for possible dilatation as she has had an abnormal barium swallow.  Objective: Vital signs in last 24 hours: Temp:  [97.6 F (36.4 C)-98.1 F (36.7 C)] 98 F (36.7 C) (03/09 0526) Pulse Rate:  [97-106] 97 (03/09 0526) Resp:  [16-18] 18 (03/09 0526) BP: (120-136)/(72-79) 120/77 (03/09 0526) SpO2:  [92 %-100 %] 97 % (03/09 0526) Last BM Date: 08/19/20  Intake/Output from previous day: 03/08 0701 - 03/09 0700 In: 474 [P.O.:474] Out: 400 [Urine:400] Intake/Output this shift: No intake/output data recorded.  General appearance: alert, cooperative, appears stated age and no distress Resp: clear to auscultation bilaterally Cardio: regular rate and rhythm, S1, S2 normal, no murmur, click, rub or gallop GI: soft, non-tender; bowel sounds normal; no masses,  no organomegaly Extremities: extremities normal, atraumatic, no cyanosis or edema  Lab Results: Recent Labs    08/18/20 1029 08/19/20 0735 08/20/20 0404  WBC 10.6* 9.4 8.7  HGB 11.0* 9.8* 8.2*  HCT 35.5* 30.3* 26.2*  PLT 449* 371 371   BMET Recent Labs    08/18/20 1029 08/19/20 0635 08/20/20 0404  NA 139 140 141  K 5.0 4.8 4.5  CL 102 105 106  CO2 27 29 28   GLUCOSE 109* 82 86  BUN 9 10 12   CREATININE 1.12* 0.93 0.92  CALCIUM 10.4* 9.8 9.4   LFT Recent Labs    08/18/20 1029  PROT 5.4*  ALBUMIN 2.8*  AST 36  ALT 21  ALKPHOS 71  BILITOT 0.7   Studies/Results: EKG SITE RITE  Result Date: 08/19/2020 If Site Rite image not attached, placement could not be confirmed due to current cardiac rhythm.  Medications: I have reviewed the patient's current medications.  Assessment/Plan: 1) Dysphagia/abnormal weight loss-following stroke and complicated prolonged illness due to Covid, with necrotizing  pneumonia and multiple strokes-EGD is planned for tomorrow.  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 3) Supraventricular tachycardia/PACs versus A. Fib. 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 5) Hypertension/Hyperlipidemia. 6) Hypokalemia. 7) Covid infection in November 2021 complicated by stroke and bilateral DVTs and MSSA empyema and multifocal necrotizing pneumonitis. 8) Iron deficiency anemia. 9) History of colonic polyps followed by Dr. 10/19/2020 GI.   LOS: 2 days   December 2021 08/20/2020, 8:40 AM

## 2020-08-20 NOTE — Progress Notes (Signed)
Physical Therapy Treatment [name] MRN: [id number] DOB: 06-Apr-1941 Today's Date: 08/20/2020    History of Present Illness Pt is a 80 year old woman admitted from home with her daughter with weakness experienced at the bottom of the stairs when she was going to the doctor. +hypokalemia.  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 PMH: recent COVID, CVA, necrotizing PNA, T PTX, HTN, HLD, CKD III, dysphagia, MSSA empyema, B LE DVT. Pt developed tachycardia 3/6 during hospitalization.    PT Comments    Pt supine in bed.  Performed gt training and remains to present with afib tachycardia.  Pt continues to be on track from a mobility stand point for home. Plan for stair training next session if HR is better controlled.   Follow Up Recommendations  Home health PT     Equipment Recommendations  None recommended by PT    Recommendations for Other Services       Precautions / Restrictions Precautions Precautions: Fall Precaution Comments: monitor HR Restrictions Weight Bearing Restrictions: No    Mobility  Bed Mobility Overal bed mobility: Modified Independent             General bed mobility comments: increased time, no assist or use of rail    Transfers Overall transfer level: Needs assistance Equipment used: Rolling walker (2 wheeled) Transfers: Sit to/from Stand Sit to Stand: Min guard         General transfer comment: min guard for safety, lines  Ambulation/Gait Ambulation/Gait assistance: Min guard Gait Distance (Feet): 30 Feet Assistive device: Rolling walker (2 wheeled) Gait Pattern/deviations: Step-through pattern;Trunk flexed;Decreased stride length     General Gait Details: Resting HR 119. Max HR 145 during in room mobility. 2/4 DOE. 3-minute recovery in recliner for HR to return to 105.   Stairs             Wheelchair Mobility     Modified Rankin (Stroke Patients Only)       Balance Overall balance assessment: Needs assistance Sitting-balance support: Feet supported;No upper extremity supported Sitting balance-Leahy Scale: Good       Standing balance-Leahy Scale: Poor                              Cognition Arousal/Alertness: Awake/alert Behavior During Therapy: WFL for tasks assessed/performed Overall Cognitive Status: No family/caregiver present to determine baseline cognitive functioning                                 General Comments: Pt is aware her MRI revealed subacute strokes, not acute.      Exercises      General Comments        Pertinent Vitals/Pain Pain Assessment: No/denies pain    Home Living                      Prior Function            PT Goals (current goals can now be found in the care plan section) Acute Rehab PT Goals Patient Stated Goal: home Potential to Achieve Goals: Good Progress towards PT goals: Progressing toward goals    Frequency    Min 4X/week      PT Plan Current plan remains appropriate    Co-evaluation  AM-PAC PT "6 Clicks" Mobility   Outcome Measure  Help needed turning from your back to your side while in a flat bed without using bedrails?: A Little Help needed moving from lying on your back to sitting on the side of a flat bed without using bedrails?: A Little Help needed moving to and from a bed to a chair (including a wheelchair)?: A Little Help needed standing up from a chair using your arms (e.g., wheelchair or bedside chair)?: A Little Help needed to walk in hospital room?: A Little Help needed climbing 3-5 steps with a railing? : A Little 6 Click Score: 18    End of Session Equipment Utilized During Treatment: Gait belt Activity Tolerance: Treatment limited secondary to medical complications (Comment) Patient left: in chair;with call bell/phone within reach;with chair alarm  set Nurse Communication: Mobility status;Other (comment) (HR)       Time: 7622-6333 PT Time Calculation (min) (ACUTE ONLY): 17 min  Charges:  $Gait Training: 8-22 mins                     Bonney Leitz , PTA Acute Rehabilitation Services Pager [phone] Office [phone]     Kendrick Remigio Artis Delay 08/20/2020, 5:23 PM

## 2020-08-21 ENCOUNTER — Encounter (HOSPITAL_COMMUNITY)
Admission: EM | Disposition: A | Discharge status: Home Health | Payer: Self-pay | Source: Home / Self Care | Attending: Internal Medicine

## 2020-08-21 ENCOUNTER — Inpatient Hospital Stay (HOSPITAL_COMMUNITY): Payer: Medicare (Managed Care) | Admitting: Anesthesiology

## 2020-08-21 ENCOUNTER — Encounter (HOSPITAL_COMMUNITY): Payer: Self-pay | Admitting: Internal Medicine

## 2020-08-21 DIAGNOSIS — I6310 Cerebral infarction due to embolism of unspecified precerebral artery: Secondary | ICD-10-CM | POA: Diagnosis not present

## 2020-08-21 DIAGNOSIS — I471 Supraventricular tachycardia: Secondary | ICD-10-CM | POA: Diagnosis not present

## 2020-08-21 DIAGNOSIS — R1310 Dysphagia, unspecified: Secondary | ICD-10-CM | POA: Diagnosis not present

## 2020-08-21 DIAGNOSIS — R531 Weakness: Secondary | ICD-10-CM | POA: Diagnosis not present

## 2020-08-21 HISTORY — PX: ESOPHAGOGASTRODUODENOSCOPY (EGD) WITH PROPOFOL: SHX5813

## 2020-08-21 HISTORY — PX: SAVORY DILATION: SHX5439

## 2020-08-21 LAB — BASIC METABOLIC PANEL
Anion gap: 6 (ref 5–15)
BUN: 11 mg/dL (ref 8–23)
CO2: 29 mmol/L (ref 22–32)
Calcium: 9.3 mg/dL (ref 8.9–10.3)
Chloride: 105 mmol/L (ref 98–111)
Creatinine, Ser: 0.94 mg/dL (ref 0.44–1.00)
GFR, Estimated: >60 mL/min (ref >60)
Glucose, Bld: 78 mg/dL (ref 70–99)
Potassium: 4.3 mmol/L (ref 3.5–5.1)
Sodium: 140 mmol/L (ref 135–145)

## 2020-08-21 LAB — CBC
HCT: 26 % — ABNORMAL LOW (ref 36.0–46.0)
Hemoglobin: 7.9 g/dL — ABNORMAL LOW (ref 12.0–15.0)
MCH: 28.9 pg (ref 26.0–34.0)
MCHC: 30.4 g/dL (ref 30.0–36.0)
MCV: 95.2 fL (ref 80.0–100.0)
Platelets: 370 10*3/uL (ref 150–400)
RBC: 2.73 MIL/uL — ABNORMAL LOW (ref 3.87–5.11)
RDW: 19.3 % — ABNORMAL HIGH (ref 11.5–15.5)
WBC: 7.9 10*3/uL (ref 4.0–10.5)
nRBC: 0 % (ref 0.0–0.2)

## 2020-08-21 LAB — HEPARIN LEVEL (UNFRACTIONATED): Heparin Unfractionated: 0.35 IU/mL (ref 0.30–0.70)

## 2020-08-21 LAB — APTT: aPTT: 74 seconds — ABNORMAL HIGH (ref 24–36)

## 2020-08-21 LAB — SARS CORONAVIRUS 2 (TAT 6-24 HRS): SARS Coronavirus 2: NEGATIVE

## 2020-08-21 SURGERY — ESOPHAGOGASTRODUODENOSCOPY (EGD) WITH PROPOFOL
Anesthesia: Monitor Anesthesia Care

## 2020-08-21 SURGERY — ESOPHAGOGASTRODUODENOSCOPY (EGD) WITH PROPOFOL
Anesthesia: Monitor Anesthesia Care | Laterality: Left

## 2020-08-21 MED ORDER — PHENOL 1.4 % MT LIQD
1.0000 | OROMUCOSAL | Status: DC | PRN
  Administered 2020-08-21: 1 via OROMUCOSAL
  Filled 2020-08-21: qty 177

## 2020-08-21 MED ORDER — LACTATED RINGERS IV SOLN
INTRAVENOUS | Status: DC
  Administered 2020-08-21: 1000 mL via INTRAVENOUS

## 2020-08-21 MED ORDER — PROPOFOL 500 MG/50ML IV EMUL
INTRAVENOUS | Status: DC | PRN
  Administered 2020-08-21: 100 ug/kg/min via INTRAVENOUS

## 2020-08-21 MED ORDER — LACTATED RINGERS IV SOLN: INTRAVENOUS | Status: DC | PRN

## 2020-08-21 MED ORDER — PROPOFOL 10 MG/ML IV BOLUS
INTRAVENOUS | Status: DC | PRN
  Administered 2020-08-21: 20 mg via INTRAVENOUS

## 2020-08-21 MED ORDER — HEPARIN (PORCINE) 25000 UT/250ML-% IV SOLN
900.0000 [IU]/h | INTRAVENOUS | Status: DC
  Administered 2020-08-21: 17:00:00 800 [IU]/h via INTRAVENOUS
  Filled 2020-08-21: qty 250

## 2020-08-21 SURGICAL SUPPLY — 15 items

## 2020-08-21 NOTE — Anesthesia Preprocedure Evaluation (Addendum)
Anesthesia Evaluation  Patient identified by MRN, date of birth, ID band Patient awake    Reviewed: Allergy & Precautions, NPO status , Patient's Chart, lab work & pertinent test results  Airway Mallampati: II  TM Distance: >3 FB Neck ROM: Full    Dental  (+) Partial Upper, Partial Lower   Pulmonary neg pulmonary ROS, former smoker,    Pulmonary exam normal breath sounds clear to auscultation       Cardiovascular hypertension, negative cardio ROS Normal cardiovascular exam Rhythm:Regular Rate:Normal     Neuro/Psych CVA, Residual Symptoms negative psych ROS   GI/Hepatic Neg liver ROS, dysphagia   Endo/Other  negative endocrine ROS  Renal/GU negative Renal ROS  negative genitourinary   Musculoskeletal negative musculoskeletal ROS (+)   Abdominal   Peds negative pediatric ROS (+)  Hematology  (+) anemia ,   Anesthesia Other Findings   Reproductive/Obstetrics negative OB ROS                            Anesthesia Physical Anesthesia Plan  ASA: III  Anesthesia Plan: MAC   Post-op Pain Management:    Induction: Intravenous  PONV Risk Score and Plan: 2 and Propofol infusion and Treatment may vary due to age or medical condition  Airway Management Planned: Natural Airway, Simple Face Mask and Nasal Cannula  Additional Equipment:   Intra-op Plan:   Post-operative Plan:   Informed Consent: I have reviewed the patients History and Physical, chart, labs and discussed the procedure including the risks, benefits and alternatives for the proposed anesthesia with the patient or authorized representative who has indicated his/her understanding and acceptance.       Plan Discussed with: CRNA and Anesthesiologist  Anesthesia Plan Comments:         Anesthesia Quick Evaluation

## 2020-08-21 NOTE — Progress Notes (Signed)
Occupational Therapy Treatment [name] MRN: [id number] DOB: March 25, 1941 Today's Date: 08/21/2020    History of present illness Pt is a 80 year old woman admitted from home with her daughter with weakness experienced at the bottom of the stairs when she was going to the doctor. +hypokalemia.  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 PMH: recent COVID, CVA, necrotizing PNA, T PTX, HTN, HLD, CKD III, dysphagia, MSSA empyema, B LE DVT. Pt developed tachycardia 3/6 during hospitalization.   OT comments  Pt toileted on 3 in 1 with min assist for thoroughness with pericare. Sat on EOB for grooming. Pt declined up to chair, reports she anticipates leaving for EGD soon. Pt with HR in mid 130s throughout session, but less dyspneic than during yesterday's visit.   Follow Up Recommendations  No OT follow up    Equipment Recommendations  None recommended by OT    Recommendations for Other Services      Precautions / Restrictions Precautions Precautions: Fall Precaution Comments: monitor HR Restrictions Weight Bearing Restrictions: No       Mobility Bed Mobility Overal bed mobility: Modified Independent                  Transfers Overall transfer level: Needs assistance Equipment used: None Transfers: Sit to/from Stand;Stand Pivot Transfers Sit to Stand: Supervision Stand pivot transfers: Supervision       General transfer comment: supervision for lines, safety    Balance   Sitting-balance support: Feet supported;No upper extremity supported Sitting balance-Leahy Scale: Good     Standing balance support: Single extremity supported Standing balance-Leahy Scale: Poor Standing balance comment: reliant on at least one hand support during pericare                           ADL either performed or assessed with clinical judgement   ADL Overall ADL's :  Needs assistance/impaired     Grooming: Set up;Sitting;Wash/dry hands;Wash/dry Scientist, research (physical sciences): Supervision/safety;Stand-pivot;BSC   Toileting- Architect and Hygiene: Minimal assistance;Sit to/from stand Toileting - Clothing Manipulation Details (indicate cue type and reason): assist for thoroughness after BM       General ADL Comments: HR in mid 130s throughout session     Vision       Perception     Praxis      Cognition Arousal/Alertness: Awake/alert Behavior During Therapy: WFL for tasks assessed/performed Overall Cognitive Status: No family/caregiver present to determine baseline cognitive functioning                                          Exercises     Shoulder Instructions       General Comments      Pertinent Vitals/ Pain       Pain Assessment: No/denies pain  Home Living                                          Prior Functioning/Environment              Frequency  Min 2X/week  Progress Toward Goals  OT Goals(current goals can now be found in the care plan section)  Progress towards OT goals: Progressing toward goals  Acute Rehab OT Goals Patient Stated Goal: home OT Goal Formulation: With patient Time For Goal Achievement: 08/29/20 Potential to Achieve Goals: Good  Plan Discharge plan remains appropriate    Co-evaluation                 AM-PAC OT "6 Clicks" Daily Activity     Outcome Measure   Help from another person eating meals?: None Help from another person taking care of personal grooming?: A Little Help from another person toileting, which includes using toliet, bedpan, or urinal?: A Little Help from another person bathing (including washing, rinsing, drying)?: A Little Help from another person to put on and taking off regular upper body clothing?: None Help from another person to put on and taking off regular lower body  clothing?: A Little 6 Click Score: 20    End of Session    OT Visit Diagnosis: Unsteadiness on feet (R26.81);Other abnormalities of gait and mobility (R26.89)   Activity Tolerance Patient tolerated treatment well   Patient Left in bed;with call bell/phone within reach;with bed alarm set   Nurse Communication          Time: 1005-1040 OT Time Calculation (min): 35 min  Charges: OT General Charges $OT Visit: 1 Visit OT Treatments $Self Care/Home Management : 23-37 mins  Martie Round, OTR/L Acute Rehabilitation Services Pager: [phone] Office: [phone]   Evern Bio 08/21/2020, 10:42 AM

## 2020-08-21 NOTE — Transfer of Care (Signed)
Immediate Anesthesia Transfer of Care Note  Patient: Beverly Howell  Procedure(s) Performed: ESOPHAGOGASTRODUODENOSCOPY (EGD) WITH PROPOFOL (N/A ) BALLOON DILATION (N/A ) SAVORY DILATION (N/A )  Patient Location: Endoscopy Unit  Anesthesia Type:MAC  Level of Consciousness: awake, alert  and oriented  Airway & Oxygen Therapy: Patient Spontanous Breathing and Patient connected to nasal cannula oxygen  Post-op Assessment: Report given to RN, Post -op Vital signs reviewed and stable and Patient moving all extremities  Post vital signs: Reviewed and stable  Last Vitals:  Vitals Value Taken Time  BP 125/90   Temp    Pulse 101 08/21/20 1232  Resp 21 08/21/20 1233  SpO2 90 % 08/21/20 1232  Vitals shown include unvalidated device data.  Last Pain:  Vitals:   08/21/20 1118  TempSrc: Oral  PainSc: 0-No pain         Complications: No complications documented.

## 2020-08-21 NOTE — Progress Notes (Signed)
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)  No Known Allergies  Patient Measurements: Height: 5\' 3"  (160 cm) Weight: 73 kg (160 lb 15 oz) IBW/kg (Calculated) : 52.4 Heparin Dosing Weight: 70 kg  Vital Signs: Temp: 97.5 F (36.4 C) (03/10 1403) Temp Source: Oral (03/10 1403) BP: 129/74 (03/10 1403) Pulse Rate: 83 (03/10 1403)  Labs: Recent Labs     0000 08/19/20 0635 08/19/20 0735 08/19/20 2211 08/20/20 0404 08/20/20 1157 08/20/20 1347 08/20/20 1423 08/20/20 2220 08/21/20 0326 08/21/20 0811  HGB   < >  --  9.8*  --  8.2*  --   --   --   --  7.9*  --   HCT  --   --  30.3*  --  26.2*  --   --   --   --  26.0*  --   PLT  --   --  371  --  371  --   --   --   --  370  --   APTT  --  41*  --    < >  --    < > 144*  --  63*  --  74*  HEPARINUNFRC  --  1.14*  --    < >  --    < >  --  0.92* 0.35  --  0.35  CREATININE  --  0.93  --   --  0.92  --   --   --   --  0.94  --    < > = values in this interval not displayed.    Estimated Creatinine Clearance: 46.4 mL/min (by C-G formula based on SCr of 0.94 mg/dL).  Assessment: 5 YOF with history of recent bilateral DVTs on Eliquis PTA, last dose 08/18/20.  Patient was transitioned to IV heparin for EGD with dilation on 08/21/20 >> normal.  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  Pharmacy consulted to resume IV heparin post EGD, aiming for the low goal due to bleeding risk/CVA.  Goal of Therapy:  Heparin level 0.3-0.5 units/mL Monitor platelets by anticoagulation protocol: Yes   Plan:  No bolus per MD - resume heparin infusion at 800 units/hr Check 8 hr heparin level Monitor closely for s/sx of bleeding F/u with transitioning to Eliquis  Janissa Bertram D. 07-21-1982, PharmD, BCPS, BCCCP 08/21/2020, 4:11 PM

## 2020-08-21 NOTE — Care Management Important Message (Signed)
Important Message  Patient Details  Name: Beverly Howell MRN: [id number] Date of Birth: Oct 29, 1940   Medicare Important Message Given:  Yes     Tristine Langi Stefan Church 08/21/2020, 9:04 AM

## 2020-08-21 NOTE — Progress Notes (Signed)
Nutrition Follow-up  DOCUMENTATION CODES:   Not applicable  INTERVENTION:   -Once diet is advanced, resume:  -Dysphagia 3 (advanced mechanical soft) diet for ease of intake -Ensure Enlive po TID, each supplement provides 350 kcal and 20 grams of protein -Magic cup TID with meals, each supplement provides 290 kcal and 9 grams of protein -MVI with mienrals daily  NUTRITION DIAGNOSIS:   Predicted suboptimal nutrient intake related to dysphagia,decreased appetite as evidenced by percent weight loss.  Ongoing  GOAL:   Patient will meet greater than or equal to 90% of their needs  Progressing   MONITOR:   PO intake,Supplement acceptance,Diet advancement,Labs,Weight trends,Skin,I & O's  REASON FOR ASSESSMENT:   Consult Assessment of nutrition requirement/status,Calorie Count  ASSESSMENT:   80 year old female with a history of HTN, HLD, CKD stage III, and recent COVID infection with CVA with residual difficulty swallowing, multifocal necrotizing pneumonia, right-sided hydropneumothorax, 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  Reviewed I/O's: +600 ml x 24 hours and +2.3 L since admission  UOP: 300 ml x 24 hours  Pt unavailable at time of visit.   Per chart review, pt reports her swallowing has improved since hospitalization. Intake has also improved; noted meal completions 15-100%. Pt is consuming Ensure Enlive supplements and enjoys them.   Per GI notes, suspect possible esophageal stricture. Plan for EGD with dilation today.  Medications reviewed and include 0.9% sodium chloride infusion @ 20 ml/hr and lactated ringers @ 10 ml/hr.   Labs reviewed.   Diet Order:   Diet Order            Diet NPO time specified  Diet effective now                 EDUCATION NEEDS:   No education needs have been identified at this time  Skin:  Skin Assessment: Reviewed RN Assessment  Last BM:   08/20/20  Height:   Ht Readings from Last 1 Encounters:  08/18/20 5\' 3"  (1.6 m)    Weight:   Wt Readings from Last 1 Encounters:  08/21/20 73 kg    Ideal Body Weight:  52.3 kg  BMI:  Body mass index is 28.51 kg/m.  Estimated Nutritional Needs:   Kcal:  2050-2250  Protein:  105-120 grams  Fluid:  > 2 L    01-14-1979, RD, LDN, CDCES Registered Dietitian II Certified Diabetes Care and Education Specialist Please refer to [facility] for RD and/or RD on-call/weekend/after hours pager

## 2020-08-21 NOTE — Plan of Care (Signed)
  Problem: Education: Goal: Knowledge of General Education information will improve Description: Including pain rating scale, medication(s)/side effects and non-pharmacologic comfort measures Outcome: Progressing   Problem: Health Behavior/Discharge Planning: Goal: Ability to manage health-related needs will improve Outcome: Progressing   Problem: Clinical Measurements: Goal: Ability to maintain clinical measurements within normal limits will improve Outcome: Progressing Goal: Will remain free from infection Outcome: Progressing Goal: Diagnostic test results will improve Outcome: Progressing Goal: Respiratory complications will improve Outcome: Progressing Goal: Cardiovascular complication will be avoided Outcome: Progressing   Problem: Activity: Goal: Risk for activity intolerance will decrease Outcome: Progressing   Problem: Nutrition: Goal: Adequate nutrition will be maintained Outcome: Progressing   Problem: Coping: Goal: Level of anxiety will decrease Outcome: Progressing   Problem: Elimination: Goal: Will not experience complications related to bowel motility Outcome: Progressing Goal: Will not experience complications related to urinary retention Outcome: Progressing   Problem: Pain Managment: Goal: General experience of comfort will improve Outcome: Progressing   Problem: Safety: Goal: Ability to remain free from injury will improve Outcome: Progressing   Problem: Skin Integrity: Goal: Risk for impaired skin integrity will decrease Outcome: Progressing   Problem: Education: Goal: Knowledge of secondary prevention will improve Outcome: Progressing Goal: Knowledge of patient specific risk factors addressed and post discharge goals established will improve Outcome: Progressing Goal: Individualized Educational Video(s) Outcome: Progressing   

## 2020-08-21 NOTE — Progress Notes (Signed)
Patient with EGD today, discussed anticoagulation with Dr. Elnoria Howard in context of bilateral DVTs. Appreciate his recommendations of starting low-dose heparin while admitted and at discharge, starting low-dose Eliquis and titrating up after 3 days.  Will monitor CBC while patient is here.

## 2020-08-21 NOTE — Interval H&P Note (Signed)
History and Physical Interval Note:  08/21/2020 11:25 AM  Beverly Howell  has presented today for surgery, with the diagnosis of dysphagia.  The various methods of treatment have been discussed with the patient and family. After consideration of risks, benefits and other options for treatment, the patient has consented to  Procedure(s): ESOPHAGOGASTRODUODENOSCOPY (EGD) WITH PROPOFOL (N/A) BALLOON DILATION (N/A) as a surgical intervention.  The patient's history has been reviewed, patient examined, no change in status, stable for surgery.  I have reviewed the patient's chart and labs.  Questions were answered to the patient's satisfaction.     Jenet Durio D

## 2020-08-21 NOTE — Anesthesia Procedure Notes (Signed)
Procedure Name: MAC Date/Time: 08/21/2020 12:17 PM Performed by: Rande Brunt, CRNA Pre-anesthesia Checklist: Patient identified, Emergency Drugs available and Patient being monitored Patient Re-evaluated:Patient Re-evaluated prior to induction Oxygen Delivery Method: Nasal cannula Induction Type: IV induction Placement Confirmation: positive ETCO2 Dental Injury: Teeth and Oropharynx as per pre-operative assessment

## 2020-08-21 NOTE — Progress Notes (Signed)
ANTICOAGULATION CONSULT NOTE - Follow Up Consult  Pharmacy Consult for heparin Indication: recent h/o DVT in setting of subacute CVA  Labs: Recent Labs    08/19/20 0635 08/19/20 0735 08/19/20 2211 08/20/20 0404 08/20/20 1157 08/20/20 1347 08/20/20 1423 08/20/20 2220 08/21/20 0326 08/21/20 0811  HGB  --  9.8*  --  8.2*  --   --   --   --  7.9*  --   HCT  --  30.3*  --  26.2*  --   --   --   --  26.0*  --   PLT  --  371  --  371  --   --   --   --  370  --   APTT 41*  --    < >  --    < > 144*  --  63*  --  74*  HEPARINUNFRC 1.14*  --    < >  --    < >  --  0.92* 0.35  --  0.35  CREATININE 0.93  --   --  0.92  --   --   --   --  0.94  --    < > = values in this interval not displayed.    Assessment: 80yo female supratherapeutic on heparin after rate change; no gtt issues or signs of bleeding per RN.  AM HL and aPTT resulted at aPTT 74 and Heparin level 0.35 which are both therapeutic on 800 units/hr. Hgb has drifted down over last 2 days.   Goal of Therapy:  Heparin level 0.3-0.5 units/ml aPTT 66-85 seconds   Plan:  Continue heparin drip at 800 units/hr Will stop monitoring aPTT Qam Heparin level and CBC  Jeanella Cara, PharmD, [facility] Clinical Pharmacist Please see AMION for all Pharmacists' Contact Phone Numbers 08/21/2020, 9:30 AM

## 2020-08-21 NOTE — Progress Notes (Signed)
HD#2 Subjective:  Overnight Events:  No acute events overnight  Patient states she slept well overnight.  Denies any new episodes of weakness.  Denies difficulty eating or drinking.  Plan to have endoscopy performed today.  Objective:  Vital signs in last 24 hours:       Vitals:   08/15/20 0208 08/15/20 0500 08/15/20 0532 08/15/20 1439  BP: 129/70  119/60 112/70  Pulse: 68  63 75  Resp: Temp: 98.1 F (36.7 C)  97.7 F (36.5 C) 97.8 F (36.6 C)  TempSrc: Oral  Oral Oral  SpO2: 100%  98% 100%  Weight:  68.1 kg     Supplemental O2: Room Air SpO2: 100 %   Physical Exam:  Constitutional: well-appearing HENT: normocephalic atraumatic Eyes: conjunctiva non-erythematous. Neck: supple Cardiovascular: Regular rate, Irregular rhythm, no m/r/g Pulmonary/Chest: normal work of breathing on room air MSK: normal bulk and tone Neurological: alert & oriented x 3.  Skin: PICC in place, no erythema, swelling, or tenderness. Psych: Normal mood      Filed Weights   08/15/20 0500  Weight: 68.1 kg     Imaging: MR BRAIN WO CONTRAST  Result Date: 08/15/2020 CLINICAL DATA:  weakness EXAM: MRI HEAD WITHOUT CONTRAST TECHNIQUE: Multiplanar, multiecho pulse sequences of the brain and surrounding structures were obtained without intravenous contrast. COMPARISON:  Concurrent MRV head.  04/22/2020 and prior. FINDINGS: Brain: 3 mm right centrum semiovale DWI hyperintensity. Slightly larger DWI hyperintensity involving the right corona radiata. Sequela of small remote right MCA territory insults, better demonstrated on prior exam. Small remote right parietal hemorrhages. No midline shift, ventriculomegaly or extra-axial fluid collection. No mass lesion. Chronic left cerebellar insult. Mild chronic microvascular ischemic changes. Vascular: Major intracranial flow voids are preserved at the skull base. Prominent flow voids on SWI involving the right corona radiata  and occipital region. Skull and upper cervical spine: Normal marrow signal. Sinuses/Orbits: Normal orbits. Pneumatized paranasal sinuses. Trace mastoid free fluid. Other: None. IMPRESSION: 3 mm subacute right centrum semiovale infarct. Slightly larger subacute infarct involving the right corona radiata. 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 Consider postcontrast MRI for better characterization. Multifocal remote chronic insults as detailed above. Mild chronic microvascular ischemic changes. These results will be called to the ordering clinician or representative by the Radiologist Assistant, and communication documented in the PACS or [employer]. Electronically Signed   By: Stana Bunting M.D.   On: 08/15/2020 13:02   DG Swallowing Func-Speech Pathology  Result Date: 08/15/2020 Objective Swallowing Evaluation: Type of Study: MBS-Modified Barium Swallow Study  Patient Details Name: Peggye Poon MRN: [id number] Date of Birth: December 19, 1940 Today's Date: 08/15/2020 Time: SLP Start Time (ACUTE ONLY): 1043 -SLP Stop Time (ACUTE ONLY): 1054 SLP Time Calculation (min) (ACUTE ONLY): 11 min Past Medical History: Past Medical History: Diagnosis Date  High cholesterol   Hypertension  Past Surgical History: Past Surgical History: Procedure Laterality Date  HAND SURGERY   HPI: This is 80 year old female with a history of HTN, HLD, CKD stage III, and recent COVID infection with 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 Pt reports swallowing difficulty since November, but not immediately following stroke.  Pt was not seen by ST during admission for CVA, presumably because of no dysphagia at that time.  Subjective:  Pt awake, alert, pleasant, participative Assessment /  Plan / Recommendation CHL IP CLINICAL IMPRESSIONS 08/15/2020 Clinical Impression Pt presents with normal oropharyngeal swallow function.  There was no penetration or aspiration of any consistencies trialed.  There was no pharyngeal residue. Swallow was intermittently delayed to vallecula or pyriform sinuses with thin liquids, but remains WFL, especially considering pt's age.  There was stasis of contrast noted in upper esophagus with puree and solid textures.  Pt stated that she felt something was stuck or she would have to bring something up on several occaision and bolus trials were paused to allow this sensation to pass.  With pill simulation there was initially difficulty with oral transit at pill was expectorated, but pt was able to clear pill on second attempt when self-administered.  On esophageal sweep, tablet could not be located, but there was contrast material remaining in esophagus. Of note, there was mild narrowing of the pharynx, which seems to be related to cervical osteophytes which did not impair swallow function.  Recommend regular texture diet with thin liquid. Consider GI consult as pt's dysphagia symptoms are impacting her level of function and seem to be causing her distress. SLP Visit Diagnosis Dysphagia, unspecified (R13.10) Attention and concentration deficit following -- Frontal lobe and executive function deficit following -- Impact on safety and function No limitations   CHL IP TREATMENT RECOMMENDATION 08/15/2020 Treatment Recommendations No treatment recommended at this time   Prognosis 08/15/2020 Prognosis for Safe Diet Advancement (No Data) Barriers to Reach Goals -- Barriers/Prognosis Comment -- CHL IP DIET RECOMMENDATION 08/15/2020 SLP Diet Recommendations Regular solids;Thin liquid Liquid Administration via Cup;Straw Medication Administration Whole meds with liquid Compensations Slow rate;Small sips/bites Postural Changes Seated upright at 90 degrees;Remain semi-upright after after  feeds/meals (Comment)   CHL IP OTHER RECOMMENDATIONS 08/15/2020 Recommended Consults Consider GI evaluation;Consider esophageal assessment Oral Care Recommendations Oral care BID Other Recommendations --   CHL IP FOLLOW UP RECOMMENDATIONS 08/15/2020 Follow up Recommendations None   CHL IP FREQUENCY AND DURATION 08/15/2020 Speech Therapy Frequency (ACUTE ONLY) (No Data) Treatment Duration --      CHL IP ORAL PHASE 08/15/2020 Oral Phase Impaired Oral - Pudding Teaspoon -- Oral - Pudding Cup -- Oral - Honey Teaspoon -- Oral - Honey Cup -- Oral - Nectar Teaspoon -- Oral - Nectar Cup -- Oral - Nectar Straw -- Oral - Thin Teaspoon -- Oral - Thin Cup WFL Oral - Thin Straw WFL Oral - Puree WFL Oral - Mech Soft -- Oral - Regular Piecemeal swallowing Oral - Multi-Consistency -- Oral - Pill Reduced posterior propulsion Oral Phase - Comment --  CHL IP PHARYNGEAL PHASE 08/15/2020 Pharyngeal Phase Impaired Pharyngeal- Pudding Teaspoon -- Pharyngeal -- Pharyngeal- Pudding Cup -- Pharyngeal -- Pharyngeal- Honey Teaspoon -- Pharyngeal -- Pharyngeal- Honey Cup -- Pharyngeal -- Pharyngeal- Nectar Teaspoon -- Pharyngeal -- Pharyngeal- Nectar Cup -- Pharyngeal -- Pharyngeal- Nectar Straw -- Pharyngeal -- Pharyngeal- Thin Teaspoon -- Pharyngeal -- Pharyngeal- Thin Cup Delayed swallow initiation-vallecula;Delayed swallow initiation-pyriform sinuses Pharyngeal Material does not enter airway Pharyngeal- Thin Straw Delayed swallow initiation-vallecula;Delayed swallow initiation-pyriform sinuses Pharyngeal Material does not enter airway Pharyngeal- Puree WFL Pharyngeal Material does not enter airway Pharyngeal- Mechanical Soft -- Pharyngeal -- Pharyngeal- Regular WFL Pharyngeal Material does not enter airway Pharyngeal- Multi-consistency -- Pharyngeal -- Pharyngeal- Pill WFL Pharyngeal Material does not enter airway Pharyngeal Comment --  CHL IP CERVICAL ESOPHAGEAL PHASE 08/15/2020 Cervical Esophageal Phase Impaired Pudding Teaspoon -- Pudding Cup --  Honey Teaspoon -- Honey Cup -- Nectar Teaspoon --  Nectar Cup -- Nectar Straw -- Thin Teaspoon -- Thin Cup WFL Thin Straw WFL Puree (No Data) Mechanical Soft -- Regular (No Data) Multi-consistency -- Pill WFL Cervical Esophageal Comment -- Kerrie Pleasure, MA, CCC-SLP Acute Rehabilitation Services Office: [phone] 08/15/2020, 11:43 AM              MR MRV HEAD WO CM  Result Date: 08/15/2020 CLINICAL DATA:  Weakness of both lower extremities. EXAM: MR VENOGRAM OF THE HEAD WITHOUT CONTRAST TECHNIQUE: Angiographic images of the intracranial venous structures were obtained using MRV technique without intravenous contrast. COMPARISON:  04/22/2020 FINDINGS: Superior sagittal sinus is patent and normal. Both transverse sinuses are patent, with the left being larger than the right. Sigmoid sinuses and jugular veins are patent. Deep veins are patent. No visible superficial thrombosis. IMPRESSION: Normal intracranial venography. Electronically Signed   By: Paulina Fusi M.D.   On: 08/15/2020 11:40   ECHOCARDIOGRAM COMPLETE  Result Date: 08/15/2020    ECHOCARDIOGRAM REPORT   Patient Name:   CLORA OHMER Date of Exam: 08/15/2020 Medical Rec #:  [id number]     Height:       63.0 in Accession #:    [id number]    Weight:       150.1 lb Date of Birth:  Feb 02, 1941      BSA:          1.712 m Patient Age:    80 years      BP:           112/70 mmHg Patient Gender: F             HR:           83 bpm. Exam Location:  Inpatient Procedure: 2D Echo Indications:    stroke  History:        Patient has prior history of Echocardiogram examinations, most                 recent 04/23/2020. Abnormal ECG, chronic kidney disease; Risk                 Factors:Hypertension and Dyslipidemia.  Sonographer:    Delcie Roch Referring Phys: Stefanie.Gip EMILY B MULLEN  Sonographer Comments: Image acquisition challenging due to respiratory motion. IMPRESSIONS  1. Left ventricular ejection fraction, by estimation, is >75%. The left ventricle has  hyperdynamic function. The left ventricle has no regional wall motion abnormalities. Left ventricular diastolic parameters are consistent with Grade I diastolic dysfunction (impaired relaxation).  2. Right ventricular systolic function is hyperdynamic. The right ventricular size is normal. There is mildly elevated pulmonary artery systolic pressure. The estimated right ventricular systolic pressure is 43.4 mmHg.  3. The mitral valve was not well visualized. Trivial mitral valve regurgitation.  4. The aortic valve was not well visualized. There is moderate calcification of the aortic valve. Aortic valve regurgitation is not visualized. Mild to moderate aortic valve sclerosis/calcification is present, without any evidence of aortic stenosis.  5. The inferior vena cava is normal in size with greater than 50% respiratory variability, suggesting right atrial pressure of 3 mmHg. Comparison(s): Changes from prior study are noted. 04/23/2020: LVEF 60-65%. FINDINGS  Left Ventricle: Left ventricular ejection fraction, by estimation, is >75%. The left ventricle has hyperdynamic function. The left ventricle has no regional wall motion abnormalities. The left ventricular internal cavity size was small. There is no left  ventricular hypertrophy. Left ventricular diastolic parameters are consistent with Grade I diastolic dysfunction (impaired relaxation). Indeterminate filling pressures. Right  Ventricle: The right ventricular size is normal. No increase in right ventricular wall thickness. Right ventricular systolic function is hyperdynamic. There is mildly elevated pulmonary artery systolic pressure. The tricuspid regurgitant velocity is 3.18 m/s, and with an assumed right atrial pressure of 3 mmHg, the estimated right ventricular systolic pressure is 43.4 mmHg. Left Atrium: Left atrial size was normal in size. Right Atrium: Right atrial size was normal in size. Pericardium: There is no evidence of pericardial effusion. Mitral  Valve: The mitral valve was not well visualized. Trivial mitral valve regurgitation. Tricuspid Valve: The tricuspid valve is grossly normal. Tricuspid valve regurgitation is mild. Aortic Valve: The aortic valve was not well visualized. There is moderate calcification of the aortic valve. Aortic valve regurgitation is not visualized. Mild to moderate aortic valve sclerosis/calcification is present, without any evidence of aortic stenosis. Pulmonic Valve: The pulmonic valve was normal in structure. Pulmonic valve regurgitation is not visualized. Aorta: The aortic arch was not well visualized. Venous: The inferior vena cava is normal in size with greater than 50% respiratory variability, suggesting right atrial pressure of 3 mmHg. IAS/Shunts: No atrial level shunt detected by color flow Doppler.  LEFT VENTRICLE PLAX 2D LVIDd:         3.60 cm  Diastology LVIDs:         1.70 cm  LV e' medial:    8.49 cm/s LV PW:         1.00 cm  LV E/e' medial:  8.3 LV IVS:        1.00 cm  LV e' lateral:   9.03 cm/s LVOT diam:     1.70 cm  LV E/e' lateral: 7.8 LVOT Area:     2.27 cm  RIGHT VENTRICLE         IVC TAPSE (M-mode): 1.6 cm  IVC diam: 1.50 cm LEFT ATRIUM             Index       RIGHT ATRIUM          Index LA diam:        2.70 cm 1.58 cm/m  RA Area:     8.87 cm LA Vol (A2C):   28.1 ml 16.42 ml/m RA Volume:   16.90 ml 9.87 ml/m LA Vol (A4C):   24.8 ml 14.49 ml/m LA Biplane Vol: 26.7 ml 15.60 ml/m   AORTA Ao Root diam: 2.50 cm MITRAL VALVE               TRICUSPID VALVE MV Area (PHT): 2.76 cm    TR Peak grad:   40.4 mmHg MV Decel Time: 275 msec    TR Vmax:        318.00 cm/s MV E velocity: 70.40 cm/s MV A velocity: 85.90 cm/s  SHUNTS MV E/A ratio:  0.82        Systemic Diam: 1.70 cm Zoila Shutter MD Electronically signed by Zoila Shutter MD Signature Date/Time: 08/15/2020/5:30:22 PM    Final     Assessment/Plan:   Active Problems:   Hypokalemia due to inadequate potassium intake   Patient Summary: Lindora Alviar  is a 80 y.o. with a pertinent PMH of  HTN, HLD,CKD stage III,and recent COVID infection with CVA with residual difficulty swallowing, multifocal necrotizing pneumonia,right-sided hydropneumothorax,MSSA empyemas/pinsertion of chesttubes, who presented with a 1 month history of generalized weakness and developedworsening of her bilateral lower extremity weakness that resulted in fall space prior to arrival and admitted for further work-up of generalized weakness and hypokalemia.  Generalized weakness, multifactorial Symptomatic hypotension - resolved No new weakness.  Hypotension has resolved.  We will continue to monitor -Hypotension resolved -Continue cardiac monitoring -Replete with fluids as needed  Supraventricular tachycardia, PAC Tachycardia has improved, with episodes of regular rate.  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 -Continue PT/OT-recommend home health PT -Weakness/hypotension has plan per above  Dysphagia,  EGD today. Found to have esophageal stricture, that was dilated.  Patient tolerated the procedure well.  Patient states she notices a difference -GI signing off. -Transition back to Eliquis in 3 days -Nutrition consult  AKI: Creatinine at baseline -Daily BMPs  History of Covid: History of multifocal necrotizing pneumonia: History of right lower lung empyema and pneumothorax status post chest tube placement: -Continue Augmentin, stop date will be discussed with Dr. Daiva EvesVan Dam in clinic later this month -Follow-up with cardiothoracic surgery and infectious disease outpatient  History of bilateral lower extremity DVT: -Patient to transition from heparin back to Eliquis in 3 days.  Hypertension: Holding home antihypertensives at this time in setting of hypotension  Diet: Regular, n.p.o. at  midnight IVF:  None  VTE: Heparin with transition to Eliquis in 3 days Code: Full PT/OT recs: Home health TOC recs: None at this time Family Update:  Called patient's daughter Archie Pattenonya and updated her on EGD results plan for discharge tomorrow.  Dispo: Anticipated discharge tomorrow with daughter and home health PT.  Thalia BloodgoodVasili Jermel Artley DO Internal Medicine Resident PGY-1 Pager [phone] Please contact the on call pager after 5 pm and on weekends at [phone].

## 2020-08-21 NOTE — Progress Notes (Signed)
Physical Therapy Treatment [name] MRN: [id number] DOB: December 25, 1940 Today's Date: 08/21/2020    History of Present Illness Pt is a 80 year old woman admitted from home with her daughter with weakness experienced at the bottom of the stairs when she was going to the doctor. +hypokalemia.  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 PMH: recent COVID, CVA, necrotizing PNA, T PTX, HTN, HLD, CKD III, dysphagia, MSSA empyema, B LE DVT. Pt developed tachycardia 3/6 during hospitalization.    PT Comments    Pt resting in bed and agreeable to OOB mobility with pt goal of walking to the bathroom. Pt ambulatory to and from bathroom, as well as additional room distance with RW. Pt requires close guard and cues for safety, and lacks insight into fall risk once d/c home. Pt with tachycardia up to 150 bpm during mobility, with DOE 2/4 and sats WFL on RA. Pt tolerated limited LE exercises this session, fatigued from gait. Pt remains appropriate to d/c with assist of HHPT and daughter, will continue to follow acutely.   Follow Up Recommendations  Home health PT     Equipment Recommendations  None recommended by PT    Recommendations for Other Services       Precautions / Restrictions Precautions Precautions: Fall Precaution Comments: monitor HR Restrictions Weight Bearing Restrictions: No    Mobility  Bed Mobility Overal bed mobility: Modified Independent             General bed mobility comments: Mod I for increased time and effort to perform    Transfers Overall transfer level: Needs assistance Equipment used: Rolling walker (2 wheeled) Transfers: Sit to/from Stand   Stand pivot transfers: Min guard       General transfer comment: min guard for safety, verbal cuing for hand placement when rising/sitting. STS x2, from EOB and toilet.  Ambulation/Gait Ambulation/Gait  assistance: Min guard Gait Distance (Feet): 42 Feet (+12) Assistive device: Rolling walker (2 wheeled) Gait Pattern/deviations: Step-through pattern;Trunk flexed;Decreased stride length Gait velocity: decr   General Gait Details: Min guard for safety, verbal cuing for upright posture and placement in RW. DOE 2/4 with SPO2 93% when assessed. HR 130-150 bpm during mobiity.   Stairs             Wheelchair Mobility    Modified Rankin (Stroke Patients Only) Modified Rankin (Stroke Patients Only) Pre-Morbid Rankin Score: Moderate disability Modified Rankin: Moderately severe disability     Balance Overall balance assessment: Needs assistance Sitting-balance support: Feet supported;No upper extremity supported Sitting balance-Leahy Scale: Good     Standing balance support: Single extremity supported Standing balance-Leahy Scale: Poor Standing balance comment: reliant on external support                            Cognition Arousal/Alertness: Awake/alert Behavior During Therapy: WFL for tasks assessed/performed Overall Cognitive Status: No family/caregiver present to determine baseline cognitive functioning                                 General Comments: requires cuing for safety, sequencing tasks. Poor safety awareness      Exercises General Exercises - Lower Extremity Ankle Circles/Pumps: AROM;Both;5 reps;Seated Long Arc Quad: AROM;Both;10 reps;Seated    General Comments        Pertinent Vitals/Pain Pain  Assessment: No/denies pain Pain Intervention(s): Limited activity within patient's tolerance    Home Living                      Prior Function            PT Goals (current goals can now be found in the care plan section) Acute Rehab PT Goals Patient Stated Goal: home PT Goal Formulation: With patient Time For Goal Achievement: 08/29/20 Potential to Achieve Goals: Good Progress towards PT goals: Progressing toward  goals    Frequency    Min 4X/week      PT Plan Current plan remains appropriate    Co-evaluation              AM-PAC PT "6 Clicks" Mobility   Outcome Measure  Help needed turning from your back to your side while in a flat bed without using bedrails?: A Little Help needed moving from lying on your back to sitting on the side of a flat bed without using bedrails?: A Little Help needed moving to and from a bed to a chair (including a wheelchair)?: A Little Help needed standing up from a chair using your arms (e.g., wheelchair or bedside chair)?: A Little Help needed to walk in hospital room?: A Little Help needed climbing 3-5 steps with a railing? : A Little 6 Click Score: 18    End of Session Equipment Utilized During Treatment: Gait belt Activity Tolerance: Treatment limited secondary to medical complications (Comment);Patient tolerated treatment well (tachycardia) Patient left: in chair;with call bell/phone within reach;with chair alarm set Nurse Communication: Mobility status;Other (comment) (HR) PT Visit Diagnosis: Muscle weakness (generalized) (M62.81);Difficulty in walking, not elsewhere classified (R26.2);Other symptoms and signs involving the nervous system (R29.898)     Time: 5732-2025 PT Time Calculation (min) (ACUTE ONLY): 22 min  Charges:  $Gait Training: 8-22 mins                    Marye Round, PT Acute Rehabilitation Services Pager [phone]  Office [phone]   Truddie Coco 08/21/2020, 4:48 PM

## 2020-08-21 NOTE — Op Note (Signed)
[facility] Patient Name: Beverly Howell Procedure Date : 08/21/2020 MRN: [id number] Attending MD: Jeani Hawking , MD Date of Birth: 25-Jun-1940 CSN: [id number] Age: 80 Admit Type: Outpatient Procedure:                Upper GI endoscopy Indications:              Dysphagia Providers:                Jeani Hawking, MD, Estella Husk RN, RN, Sunday Corn                            [place], Technician Referring MD:              Medicines:                Propofol per Anesthesia Complications:            No immediate complications. Estimated Blood Loss:     Estimated blood loss: none. Estimated blood loss                            was minimal. Procedure:                Pre-Anesthesia Assessment:                           - Prior to the procedure, a History and Physical                            was performed, and patient medications and                            allergies were reviewed. The patient's tolerance of                            previous anesthesia was also reviewed. The risks                            and benefits of the procedure and the sedation                            options and risks were discussed with the patient.                            All questions were answered, and informed consent                            was obtained. Prior Anticoagulants: The patient has                            taken no previous anticoagulant or antiplatelet                            agents. ASA Grade Assessment: III - A patient with                            severe systemic  disease. After reviewing the risks                            and benefits, the patient was deemed in                            satisfactory condition to undergo the procedure.                           - Sedation was administered by an anesthesia                            professional. Deep sedation was attained.                           After obtaining informed consent, the endoscope was                             passed under direct vision. Throughout the                            procedure, the patient's blood pressure, pulse, and                            oxygen saturations were monitored continuously. The                            GIF-H190 ([id number] Olympus gastroscope was                            introduced through the mouth, and advanced to the                            second part of duodenum. The upper GI endoscopy was                            accomplished without difficulty. The patient                            tolerated the procedure well. Scope In: Scope Out: Findings:      One benign-appearing, intrinsic mild stenosis was found at the       cricopharyngeus. This stenosis measured 1.5 cm (inner diameter) x 1 cm       (in length). The stenosis was traversed. A guidewire was placed and the       scope was withdrawn. Dilation was performed with a Savary dilator with       no resistance at 18 mm. The dilation site was examined following       endoscope reinsertion and showed complete resolution of luminal       narrowing. Estimated blood loss was minimal.      The stomach was normal.      The examined duodenum was normal.      The one time Savary dilation at 18 mm unmasked the cervical esophageal       web. This stenosis correlated with the location  of dysphagia as       described by the patient. Impression:               - Benign-appearing esophageal stenosis. Dilated.                           - Normal stomach.                           - Normal examined duodenum.                           - No specimens collected. Recommendation:           - Return patient to hospital ward for ongoing care.                           - Resume regular diet.                           - Continue present medications.                           - Restart Eliquis in 3 days.                           - Signing off. Procedure Code(s):        --- Professional ---                            [id number], Esophagogastroduodenoscopy, flexible,                            transoral; with insertion of guide wire followed by                            passage of dilator(s) through esophagus over guide                            wire Diagnosis Code(s):        --- Professional ---                           K22.2, Esophageal obstruction                           R13.10, Dysphagia, unspecified CPT copyright 2019 [employer]. All rights reserved. The codes documented in this report are preliminary and upon coder review may  be revised to meet current compliance requirements. Jeani Hawking, MD Jeani Hawking, MD 08/21/2020 12:31:02 PM This report has been signed electronically. Number of Addenda: 0

## 2020-08-21 NOTE — Anesthesia Postprocedure Evaluation (Signed)
Anesthesia Post Note  Patient: Beverly Howell  Procedure(s) Performed: ESOPHAGOGASTRODUODENOSCOPY (EGD) WITH PROPOFOL (N/A ) BALLOON DILATION (N/A ) SAVORY DILATION (N/A )     Patient location during evaluation: PACU Anesthesia Type: MAC Level of consciousness: awake and alert Pain management: pain level controlled Vital Signs Assessment: post-procedure vital signs reviewed and stable Respiratory status: spontaneous breathing, nonlabored ventilation, respiratory function stable and patient connected to nasal cannula oxygen Cardiovascular status: stable and blood pressure returned to baseline Postop Assessment: no apparent nausea or vomiting Anesthetic complications: no   No complications documented.  Last Vitals:  Vitals:   08/21/20 1248 08/21/20 1403  BP: (!) 125/54 129/74  Pulse: 99 83  Resp: (!) 22 16  Temp:  (!) 36.4 C  SpO2: 98%     Last Pain:  Vitals:   08/21/20 1403  TempSrc: Oral  PainSc:                  Merlinda Frederick

## 2020-08-22 DIAGNOSIS — I639 Cerebral infarction, unspecified: Secondary | ICD-10-CM

## 2020-08-22 DIAGNOSIS — E878 Other disorders of electrolyte and fluid balance, not elsewhere classified: Secondary | ICD-10-CM | POA: Diagnosis not present

## 2020-08-22 DIAGNOSIS — I6310 Cerebral infarction due to embolism of unspecified precerebral artery: Secondary | ICD-10-CM | POA: Diagnosis not present

## 2020-08-22 DIAGNOSIS — R531 Weakness: Secondary | ICD-10-CM | POA: Diagnosis not present

## 2020-08-22 LAB — BASIC METABOLIC PANEL
Anion gap: 6 (ref 5–15)
BUN: 8 mg/dL (ref 8–23)
CO2: 26 mmol/L (ref 22–32)
Calcium: 8.9 mg/dL (ref 8.9–10.3)
Chloride: 108 mmol/L (ref 98–111)
Creatinine, Ser: 0.91 mg/dL (ref 0.44–1.00)
GFR, Estimated: >60 mL/min (ref >60)
Glucose, Bld: 77 mg/dL (ref 70–99)
Potassium: 3.8 mmol/L (ref 3.5–5.1)
Sodium: 140 mmol/L (ref 135–145)

## 2020-08-22 LAB — CBC
HCT: 24.6 % — ABNORMAL LOW (ref 36.0–46.0)
Hemoglobin: 8 g/dL — ABNORMAL LOW (ref 12.0–15.0)
MCH: 30.4 pg (ref 26.0–34.0)
MCHC: 32.5 g/dL (ref 30.0–36.0)
MCV: 93.5 fL (ref 80.0–100.0)
Platelets: 338 10*3/uL (ref 150–400)
RBC: 2.63 MIL/uL — ABNORMAL LOW (ref 3.87–5.11)
RDW: 20.1 % — ABNORMAL HIGH (ref 11.5–15.5)
WBC: 6.8 10*3/uL (ref 4.0–10.5)
nRBC: 0 % (ref 0.0–0.2)

## 2020-08-22 LAB — HEPARIN LEVEL (UNFRACTIONATED): Heparin Unfractionated: 0.21 IU/mL — ABNORMAL LOW (ref 0.30–0.70)

## 2020-08-22 LAB — GLUCOSE, CAPILLARY
Glucose-Capillary: 66 mg/dL — ABNORMAL LOW (ref 70–99)
Glucose-Capillary: 78 mg/dL (ref 70–99)

## 2020-08-22 MED ORDER — METOPROLOL TARTRATE 25 MG PO TABS: 12.5000 mg | ORAL_TABLET | Freq: Two times a day (BID) | ORAL | Status: AC

## 2020-08-22 MED ORDER — LISINOPRIL 10 MG PO TABS
10.0000 mg | ORAL_TABLET | Freq: Every day | ORAL | Status: DC
  Administered 2020-08-22: 10 mg via ORAL
  Filled 2020-08-22: qty 1

## 2020-08-22 MED ORDER — APIXABAN 2.5 MG PO TABS: 2.5000 mg | ORAL_TABLET | Freq: Two times a day (BID) | ORAL | 0 refills | Status: AC

## 2020-08-22 MED ORDER — PHENOL 1.4 % MT LIQD: 1.0000 | OROMUCOSAL | 0 refills | Status: DC | PRN

## 2020-08-22 MED ORDER — APIXABAN 5 MG PO TABS: 5.0000 mg | ORAL_TABLET | Freq: Two times a day (BID) | ORAL | 1 refills | Status: AC

## 2020-08-22 NOTE — Discharge Summary (Signed)
Name: Beverly Howell MRN: [id number] DOB: 11-Aug-1940 80 y.o. PCP: Windell Hummingbird, PA-C  Date of Admission: 08/14/2020  8:44 AM Date of Discharge: 08/22/2020 Attending Physician: Dr. Daryll Drown  Discharge Diagnosis: Active Problems:   Hypokalemia   CVA (cerebral vascular accident) [facility])   Weakness    Discharge Medications: Allergies as of 08/22/2020   No Known Allergies     Medication List    STOP taking these medications   diltiazem 240 MG 24 hr capsule Commonly known as: DILACOR XR     TAKE these medications   acetaminophen 500 MG tablet Commonly known as: TYLENOL Take 1,000 mg by mouth every 8 (eight) hours as needed for moderate pain.   albuterol 108 (90 Base) MCG/ACT inhaler Commonly known as: VENTOLIN HFA Inhale 2 puffs into the lungs every 8 (eight) hours as needed for wheezing.   amoxicillin-clavulanate 875-125 MG tablet Commonly known as: AUGMENTIN Take 1 tablet by mouth 2 (two) times daily. Contineous   apixaban 2.5 MG Tabs tablet Commonly known as: ELIQUIS Take 1 tablet (2.5 mg total) by mouth 2 (two) times daily for 3 days. After finishing this, please take your 5 mg eliquis twice daily as you did prior to your admission What changed: You were already taking a medication with the same name, and this prescription was added. Make sure you understand how and when to take each.   apixaban 5 MG Tabs tablet Commonly known as: ELIQUIS Take 1 tablet (5 mg total) by mouth 2 (two) times daily. Start 08/25/20 Start taking on: August 25, 2020 What changed:   additional instructions  These instructions start on August 25, 2020. If you are unsure what to do until then, ask your doctor or other care provider.   atorvastatin 40 MG tablet Commonly known as: LIPITOR Take 1 tablet (40 mg total) by mouth daily.   CALCIUM 600-D PO Take 1 tablet by mouth daily.   cholecalciferol 25 MCG (1000 UNIT) tablet Commonly known as: VITAMIN D Take 1,000 Units by mouth daily.    Fish Oil Triple Strength 1400 MG Caps Take 1,400 mg by mouth daily.   guaiFENesin 600 MG 12 hr tablet Commonly known as: MUCINEX Take 1,200 mg by mouth 2 (two) times daily as needed for cough.   ipratropium-albuterol 0.5-2.5 (3) MG/3ML Soln Commonly known as: DUONEB Take 3 mLs by nebulization 2 (two) times daily as needed (wheezing).   lisinopril 10 MG tablet Commonly known as: ZESTRIL Take 10 mg by mouth daily.   metoprolol tartrate 25 MG tablet Commonly known as: LOPRESSOR Take 0.5 tablets (12.5 mg total) by mouth 2 (two) times daily. What changed:   how much to take  Another medication with the same name was removed. Continue taking this medication, and follow the directions you see here.   pantoprazole 40 MG tablet Commonly known as: PROTONIX Take 1 tablet (40 mg total) by mouth daily.   phenol 1.4 % Liqd Commonly known as: CHLORASEPTIC Use as directed 1 spray in the mouth or throat as needed for throat irritation / pain.       Disposition and follow-up:   Beverly Howell was discharged from [facility] in Good condition.  At the hospital follow up visit please address:  1.  Follow-up:  A.  Weakness-follow-up to assess for new weakness/neurological symptoms    B.  Lower extremity DVT-anticoagulation adherence   C.  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  Electrolyte abnormalities-follow-up BMP    E.  Right lower lobe empyema-follow-up with Dr. Lurline Hare.  SVT/PACs-see medications per below.  Please make sure patient has  adequate follow-up with cardiologist if need be for medication management.   G.  Hypertension-discontinued lisinopril, restarted as needed on outpatient   2.  Labs / imaging needed at time of follow-up: BMP  3.  Pending labs/ test needing follow-up: None  4.  Medication Changes  Started: None  Stopped: Diltiazem  Changed: Decrease dose of Eliquis for 3 days postop, Lopressor 12.5 BID  Abx  -Augmentin End Date: Pending, Dr. Tommy Medal to f/u during outpatient appt  Follow-up Appointments:  Follow-up Information    [facility]. Schedule an appointment as soon as possible for a visit in 4 week(s).   Specialty: Neurology Contact information: [address] [place], [facility] Follow up.   Specialty: Home Health Services Contact information: [place] [address] [phone]        Windell Hummingbird, PA-C. Schedule an appointment as soon as possible for a visit.   Specialty: Physician Assistant Why: Please call to make an appointment to see your primary care provider within a week of discharge. Contact information: [address] [place] [address] [phone]               Hospital Course by problem list:  Weakness Electrolyte abnormalities Patient brought to hospital secondary to lower extremity weakness that resulted in a fall.  There is no prodromal symptoms, loss of consciousness, palpitations, chest pain, shortness of breath.  On exam she did have some decreased strength in right lower extremity, but patient said that this was chronic.  She also had worsening of her chronic dysphagia that she had since her prior CVA in 11/21.  Weakness thought to be an multifactorial etiology of electrolyte abnormalities, poor p.o. intake from dysphagia as well as recent infections.  Head CT was negative, however, with patient's high risk factors and prior CVAs, MRI was performed.  MRI showed subacute right centrum semiovale infarct as well as subacute infarct of right corona radiata.  This was reviewed by neurology who do not believe patient has acute CVA.  Neurology suspects weakness and falling episode are due to hypotension and decreased p.o. intake.  She had no other episodes of weakness during her hospitalization and was recommended for home health PT by  physical therapy.  Electrolyte abnormalities resolved, will need to follow-up with primary care provider for routine follow-up and repeat BMP.  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 Reviewed by neurology who did not suspect that patient had acute/subacute infarcts.  Patient follow-up with primary care friend on outpatient basis.  SVT/PAC/MAC During hospitalization patient was tachycardic, with episodes of PACs/MACs.  Initially her home dose of metoprolol were held due to her hypotension.  However it was then thought that restarting these at low doses would help with her PAC/MAC and improve her overall cardiac output.  She was restarted on low-dose metoprolol tartrate 12.5 mg twice daily.  This improved her tachycardia as well as her blood pressure.  We will restart metoprolol 12.5 twice daily.  Restart patient on lisinopril 10 mg daily at discharge  Dysphagia Patient with history of dysphagia and recent weight loss of 40 pounds.  This was thought to be due to decreased p.o. intake.  Swallow study was performed that was unremarkable  however GI was consulted due to persistent dysphagia.  Patient underwent EGD and was found to have esophageal stricture, that was dilated.  Post procedure patient said she felt much better.  History of Covid: History of multifocal necrotizing pneumonia: History of right lower lung empyema and pneumothorax status post chest tube placement Patient currently on Augmentin, followed by infectious disease.  Discussed with them in date for the antibiotic, plan to have her follow-up with Dr. Drucilla Schmidt at the end of the month to determine end date.  Low extremity DVT Patient with history of lower extremity DVT.  She was continued on her Eliquis and switch to heparin preop as well as 1 day postop of her EGD.  GI recommended 3 days of low-dose Eliquis, before switching back to her regular dosing.  I will include this in the patient's  discharge instructions and she will need close follow-up with primary care provider to assure that she continues to take adequate dosing of Eliquis  Discharge Subjective: Patient reported that she was feeling good today. She states that her swallowing has been better, she reports she is taking it easy. She denied any other symptoms. Advised to monitor for any dark stools or lightheadedness. We discussed that we are making some changes to her home medications.   Discharge Exam:   BP 130/73 (BP Location: Right Arm)   Pulse 93   Temp 98 F (36.7 C) (Oral)   Resp 18   Ht _0  (1.6 m)   Wt 73 kg   SpO2 90%   BMI 28.51 kg/m  Constitutional: well-appearing HENT: normocephalic atraumatic Eyes: conjunctiva non-erythematous. Neck: supple Cardiovascular: Regular rate,Irregular rhythm, no m/r/g Pulmonary/Chest: normal work of breathing on room air MSK: normal bulk and tone Neurological: alert & oriented x 3.  Psych:Normal mood   Pertinent Labs, Studies, and Procedures:  CBC Latest Ref Rng & Units 08/22/2020 08/21/2020 08/20/2020  WBC 4.0 - 10.5 K/uL 6.8 7.9 8.7  Hemoglobin 12.0 - 15.0 g/dL 8.0(L) 7.9(L) 8.2(L)  Hematocrit 36.0 - 46.0 % 24.6(L) 26.0(L) 26.2(L)  Platelets 150 - 400 K/uL 338 370 371    CMP Latest Ref Rng & Units 08/22/2020 08/21/2020 08/20/2020  Glucose 70 - 99 mg/dL 77 78 86  BUN 8 - 23 mg/dL _1 Creatinine 0.44 - 1.00 mg/dL 0.91 0.94 0.92  Sodium 135 - 145 mmol/L 140 140 141  Potassium 3.5 - 5.1 mmol/L 3.8 4.3 4.5  Chloride 98 - 111 mmol/L 108 105 106  CO2 22 - 32 mmol/L _2 Calcium 8.9 - 10.3 mg/dL 8.9 9.3 9.4  Total Protein 6.5 - 8.1 g/dL - - -  Total Bilirubin 0.3 - 1.2 mg/dL - - -  Alkaline Phos 38 - 126 U/L - - -  AST 15 - 41 U/L - - -  ALT 0 - 44 U/L - - -    DG Chest 2 View  Result Date: 08/14/2020 CLINICAL DATA:  Syncope, weakness. EXAM: CHEST - 2 VIEW COMPARISON:  June 19, 2020.  May 19, 2020. FINDINGS: The heart size and mediastinal  contours are within normal limits. Small loculated right pleural effusion is noted. Minimal left basilar subsegmental atelectasis is noted. Margin of large air collection is again noted medially in the right lung suggesting sequela of previous cavitating pneumonia; it does not appear to be as thick walled as noted previously. Left lung is clear. The visualized skeletal structures are unremarkable. IMPRESSION: Stable small loculated right pleural effusion. Continued presence of large  air collection seen in right lung suggesting sequela of previous cavitating pneumonia; it does not appear to be as thick walled as noted previously. Electronically Signed   By: Marijo Conception M.D.   On: 08/14/2020 09:28   MR BRAIN WO CONTRAST  Result Date: 08/15/2020 CLINICAL DATA:  weakness EXAM: MRI HEAD WITHOUT CONTRAST TECHNIQUE: Multiplanar, multiecho pulse sequences of the brain and surrounding structures were obtained without intravenous contrast. COMPARISON:  Concurrent MRV head.  04/22/2020 and prior. FINDINGS: Brain: 3 mm right centrum semiovale DWI hyperintensity. Slightly larger DWI hyperintensity involving the right corona radiata. Sequela of small remote right MCA territory insults, better demonstrated on prior exam. Small remote right parietal hemorrhages. No midline shift, ventriculomegaly or extra-axial fluid collection. No mass lesion. Chronic left cerebellar insult. Mild chronic microvascular ischemic changes. Vascular: Major intracranial flow voids are preserved at the skull base. Prominent flow voids on SWI involving the right corona radiata and occipital region. Skull and upper cervical spine: Normal marrow signal. Sinuses/Orbits: Normal orbits. Pneumatized paranasal sinuses. Trace mastoid free fluid. Other: None. IMPRESSION: 3 mm subacute right centrum semiovale infarct. Slightly larger subacute infarct involving the right corona radiata. 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 Consider postcontrast MRI for better characterization. Multifocal remote chronic insults as detailed above. Mild chronic microvascular ischemic changes. These results will be called to the ordering clinician or representative by the Radiologist Assistant, and communication documented in the PACS or [employer]. Electronically Signed   By: Primitivo Gauze M.D.   On: 08/15/2020 13:02   DG Swallowing Func-Speech Pathology  Result Date: 08/15/2020 Objective Swallowing Evaluation: Type of Study: MBS-Modified Barium Swallow Study  [name] MRN: [id number] Date of Birth: 21-Jan-1941 Today's Date: 08/15/2020 Time: SLP Start Time (ACUTE ONLY): 1043 -SLP Stop Time (ACUTE ONLY): 1054 SLP Time Calculation (min) (ACUTE ONLY): 11 min Past Medical History: Past Medical History: Diagnosis Date . High cholesterol  . Hypertension  Past Surgical History: Past Surgical History: Procedure Laterality Date . HAND SURGERY   HPI: This is 80 year old female with a history of HTN, HLD, CKD stage III, and recent COVID infection with 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 Pt reports swallowing difficulty since November, but not immediately following stroke.  Pt was not seen by ST during admission for CVA, presumably because of no dysphagia at that time.  Subjective: Pt awake, alert, pleasant, participative Assessment / Plan / Recommendation CHL IP CLINICAL IMPRESSIONS 08/15/2020 Clinical Impression Pt presents with normal oropharyngeal swallow function.  There was no penetration or aspiration of any consistencies trialed.  There was no pharyngeal residue. Swallow was intermittently delayed to vallecula or pyriform sinuses with thin liquids, but remains WFL, especially considering pt's  age.  There was stasis of contrast noted in upper esophagus with puree and solid textures.  Pt stated that she felt something was stuck or she would have to bring something up on several occaision and bolus trials were paused to allow this sensation to pass.  With pill simulation there was initially difficulty with oral transit at pill was expectorated, but pt was able to clear pill on second attempt when self-administered.  On esophageal sweep, tablet could not be located, but there was contrast material remaining in esophagus. Of note, there was mild narrowing of the pharynx, which seems  to be related to cervical osteophytes which did not impair swallow function.  Recommend regular texture diet with thin liquid. Consider GI consult as pt's dysphagia symptoms are impacting her level of function and seem to be causing her distress. SLP Visit Diagnosis Dysphagia, unspecified (R13.10) Attention and concentration deficit following -- Frontal lobe and executive function deficit following -- Impact on safety and function No limitations   CHL IP TREATMENT RECOMMENDATION 08/15/2020 Treatment Recommendations No treatment recommended at this time   Prognosis 08/15/2020 Prognosis for Safe Diet Advancement (No Data) Barriers to Reach Goals -- Barriers/Prognosis Comment -- CHL IP DIET RECOMMENDATION 08/15/2020 SLP Diet Recommendations Regular solids;Thin liquid Liquid Administration via Cup;Straw Medication Administration Whole meds with liquid Compensations Slow rate;Small sips/bites Postural Changes Seated upright at 90 degrees;Remain semi-upright after after feeds/meals (Comment)   CHL IP OTHER RECOMMENDATIONS 08/15/2020 Recommended Consults Consider GI evaluation;Consider esophageal assessment Oral Care Recommendations Oral care BID Other Recommendations --   CHL IP FOLLOW UP RECOMMENDATIONS 08/15/2020 Follow up Recommendations None   CHL IP FREQUENCY AND DURATION 08/15/2020 Speech Therapy Frequency (ACUTE ONLY) (No Data) Treatment  Duration --      CHL IP ORAL PHASE 08/15/2020 Oral Phase Impaired Oral - Pudding Teaspoon -- Oral - Pudding Cup -- Oral - Honey Teaspoon -- Oral - Honey Cup -- Oral - Nectar Teaspoon -- Oral - Nectar Cup -- Oral - Nectar Straw -- Oral - Thin Teaspoon -- Oral - Thin Cup WFL Oral - Thin Straw WFL Oral - Puree WFL Oral - Mech Soft -- Oral - Regular Piecemeal swallowing Oral - Multi-Consistency -- Oral - Pill Reduced posterior propulsion Oral Phase - Comment --  CHL IP PHARYNGEAL PHASE 08/15/2020 Pharyngeal Phase Impaired Pharyngeal- Pudding Teaspoon -- Pharyngeal -- Pharyngeal- Pudding Cup -- Pharyngeal -- Pharyngeal- Honey Teaspoon -- Pharyngeal -- Pharyngeal- Honey Cup -- Pharyngeal -- Pharyngeal- Nectar Teaspoon -- Pharyngeal -- Pharyngeal- Nectar Cup -- Pharyngeal -- Pharyngeal- Nectar Straw -- Pharyngeal -- Pharyngeal- Thin Teaspoon -- Pharyngeal -- Pharyngeal- Thin Cup Delayed swallow initiation-vallecula;Delayed swallow initiation-pyriform sinuses Pharyngeal Material does not enter airway Pharyngeal- Thin Straw Delayed swallow initiation-vallecula;Delayed swallow initiation-pyriform sinuses Pharyngeal Material does not enter airway Pharyngeal- Puree WFL Pharyngeal Material does not enter airway Pharyngeal- Mechanical Soft -- Pharyngeal -- Pharyngeal- Regular WFL Pharyngeal Material does not enter airway Pharyngeal- Multi-consistency -- Pharyngeal -- Pharyngeal- Pill WFL Pharyngeal Material does not enter airway Pharyngeal Comment --  CHL IP CERVICAL ESOPHAGEAL PHASE 08/15/2020 Cervical Esophageal Phase Impaired Pudding Teaspoon -- Pudding Cup -- Honey Teaspoon -- Honey Cup -- Nectar Teaspoon -- Nectar Cup -- Nectar Straw -- Thin Teaspoon -- Thin Cup WFL Thin Straw WFL Puree (No Data) Mechanical Soft -- Regular (No Data) Multi-consistency -- Pill WFL Cervical Esophageal Comment -- Beverly Savage, Beverly Howell, Beverly Howell Acute Rehabilitation Services Office: [phone] 08/15/2020, 11:43 AM              MR MRV HEAD WO CM  Result  Date: 08/15/2020 CLINICAL DATA:  Weakness of both lower extremities. EXAM: MR VENOGRAM OF THE HEAD WITHOUT CONTRAST TECHNIQUE: Angiographic images of the intracranial venous structures were obtained using MRV technique without intravenous contrast. COMPARISON:  04/22/2020 FINDINGS: Superior sagittal sinus is patent and normal. Both transverse sinuses are patent, with the left being larger than the right. Sigmoid sinuses and jugular veins are patent. Deep veins are patent. No visible superficial thrombosis. IMPRESSION: Normal intracranial venography. Electronically Signed   By: Nelson Chimes M.D.   On: 08/15/2020 11:40   ECHOCARDIOGRAM COMPLETE  Result Date: 08/15/2020    ECHOCARDIOGRAM REPORT   Patient Name:   Beverly Howell Date of Exam: 08/15/2020 Medical Rec #:  [id number]     Height:       63.0 in Accession #:    [id number]    Weight:       150.1 lb Date of Birth:  [birth year]      BSA:          1.712 m Patient Age:    39 years      BP:           112/70 mmHg Patient Gender: F             HR:           83 bpm. Exam Location:  Inpatient Procedure: 2D Echo Indications:    stroke  History:        Patient has prior history of Echocardiogram examinations, most                 recent 04/23/2020. Abnormal ECG, chronic kidney disease; Risk                 Factors:Hypertension and Dyslipidemia.  Sonographer:    Beverly Howell  Sonographer Comments: Image acquisition challenging due to respiratory motion. IMPRESSIONS  1. Left ventricular ejection fraction, by estimation, is >75%. The left ventricle has hyperdynamic function. The left ventricle has no regional wall motion abnormalities. Left ventricular diastolic parameters are consistent with Grade I diastolic dysfunction (impaired relaxation).  2. Right ventricular systolic function is hyperdynamic. The right ventricular size is normal. There is mildly elevated pulmonary artery systolic pressure. The estimated right ventricular systolic  pressure is 24.8 mmHg.  3. The mitral valve was not well visualized. Trivial mitral valve regurgitation.  4. The aortic valve was not well visualized. There is moderate calcification of the aortic valve. Aortic valve regurgitation is not visualized. Mild to moderate aortic valve sclerosis/calcification is present, without any evidence of aortic stenosis.  5. The inferior vena cava is normal in size with greater than 50% respiratory variability, suggesting right atrial pressure of 3 mmHg. Comparison(s): Changes from prior study are noted. 04/23/2020: LVEF 60-65%. FINDINGS  Left Ventricle: Left ventricular ejection fraction, by estimation, is >75%. The left ventricle has hyperdynamic function. The left ventricle has no regional wall motion abnormalities. The left ventricular internal cavity size was small. There is no left  ventricular hypertrophy. Left ventricular diastolic parameters are consistent with Grade I diastolic dysfunction (impaired relaxation). Indeterminate filling pressures. Right Ventricle: The right ventricular size is normal. No increase in right ventricular wall thickness. Right ventricular systolic function is hyperdynamic. There is mildly elevated pulmonary artery systolic pressure. The tricuspid regurgitant velocity is 3.18 m/s, and with an assumed right atrial pressure of 3 mmHg, the estimated right ventricular systolic pressure is 25.0 mmHg. Left Atrium: Left atrial size was normal in size. Right Atrium: Right atrial size was normal in size. Pericardium: There is no evidence of pericardial effusion. Mitral Valve: The mitral valve was not well visualized. Trivial mitral valve regurgitation. Tricuspid Valve: The tricuspid valve is grossly normal. Tricuspid valve regurgitation is mild. Aortic Valve: The aortic valve was not well visualized. There is moderate calcification of the aortic valve. Aortic valve regurgitation is not visualized. Mild to moderate aortic valve sclerosis/calcification is  present, without any evidence of aortic stenosis. Pulmonic Valve: The pulmonic valve was normal in structure. Pulmonic valve regurgitation is not visualized. Aorta: The  aortic arch was not well visualized. Venous: The inferior vena cava is normal in size with greater than 50% respiratory variability, suggesting right atrial pressure of 3 mmHg. IAS/Shunts: No atrial level shunt detected by color flow Doppler.  LEFT VENTRICLE PLAX 2D LVIDd:         3.60 cm  Diastology LVIDs:         1.70 cm  LV e' medial:    8.49 cm/s LV PW:         1.00 cm  LV E/e' medial:  8.3 LV IVS:        1.00 cm  LV e' lateral:   9.03 cm/s LVOT diam:     1.70 cm  LV E/e' lateral: 7.8 LVOT Area:     2.27 cm  RIGHT VENTRICLE         IVC TAPSE (M-mode): 1.6 cm  IVC diam: 1.50 cm LEFT ATRIUM             Index       RIGHT ATRIUM          Index LA diam:        2.70 cm 1.58 cm/m  RA Area:     8.87 cm LA Vol (A2C):   28.1 ml 16.42 ml/m RA Volume:   16.90 ml 9.87 ml/m LA Vol (A4C):   24.8 ml 14.49 ml/m LA Biplane Vol: 26.7 ml 15.60 ml/m   AORTA Ao Root diam: 2.50 cm MITRAL VALVE               TRICUSPID VALVE MV Area (PHT): 2.76 cm    TR Peak grad:   40.4 mmHg MV Decel Time: 275 msec    TR Vmax:        318.00 cm/s MV E velocity: 70.40 cm/s MV A velocity: 85.90 cm/s  SHUNTS MV E/A ratio:  0.82        Systemic Diam: 1.70 cm Beverly Bishop MD Electronically signed by Beverly Bishop MD Signature Date/Time: 08/15/2020/5:30:22 PM    Final      Discharge Instructions: Discharge Instructions    Ambulatory referral to Neurology   Complete by: As directed    Follow up with stroke clinic NP (Jessica Vanschaick or Cecille Rubin, if both not available, consider Zachery Dauer, or [place]) at [facility] in about 4 weeks. Thanks.   Call MD for:  difficulty breathing, headache or visual disturbances   Complete by: As directed    Call MD for:  extreme fatigue   Complete by: As directed    Call MD for:  hives   Complete by: As directed    Call MD for:   persistant dizziness or light-headedness   Complete by: As directed    Call MD for:  persistant nausea and vomiting   Complete by: As directed    Call MD for:  redness, tenderness, or signs of infection (pain, swelling, redness, odor or green/yellow discharge around incision site)   Complete by: As directed    Call MD for:  severe uncontrolled pain   Complete by: As directed    Call MD for:  temperature >100.4   Complete by: As directed    Diet - low sodium heart healthy   Complete by: As directed    Discharge instructions   Complete by: As directed    Ms. Hillesheim thank you for letting us care for you during your hospitalization.  You were initially admitted for weakness and a possible new stroke.  Your weakness resolved  as your diet improved, we replenished your electrolytes, and with improvement of your blood pressure.  Physical therapy recommended home health physical therapy which we come in your house to work with you.  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  We will be continuing you on most of your medications.  Please take the decreased dose of your Eliquis for the next 3 days.  On 08/25/2020, please continue to take your usual 5 mg Eliquis twice daily.  We will also be starting you back on a low-dose of your metoprolol tartrate (Lopressor), 12.5 mg twice daily.  Please follow-up with your primary care provider next week as soon as you can.   Increase activity slowly   Complete by: As directed       Signed: Riesa Pope, MD 08/22/2020, 1:12 PM   Pager: [phone]

## 2020-08-22 NOTE — Plan of Care (Signed)
  Problem: Education: Goal: Knowledge of General Education information will improve Description Including pain rating scale, medication(s)/side effects and non-pharmacologic comfort measures Outcome: Progressing   

## 2020-08-22 NOTE — Progress Notes (Signed)
PT Cancellation Note  [name] MRN: [id number] DOB: 05-14-1941   Cancelled Treatment:    Reason Eval/Treat Not Completed: Other (comment).  Pt reports she is going home in just a bit, and wants to conserve her energy for her trip home.    Thanks,  Corinna Capra, PT, DPT  Acute Rehabilitation [phone] pager #[phone] office       Beverly Howell 08/22/2020, 4:56 PM

## 2020-08-22 NOTE — Progress Notes (Signed)
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)  No Known Allergies  Patient Measurements: Height: 5\' 3"  (160 cm) Weight: 73 kg (160 lb 15 oz) IBW/kg (Calculated) : 52.4 Heparin Dosing Weight: 70 kg  Vital Signs: Temp: 98 F (36.7 C) (03/11 0415) Temp Source: Oral (03/11 0415) BP: 130/73 (03/11 0415) Pulse Rate: 93 (03/11 0415)  Labs: Recent Labs    08/19/20 10/19/20 08/19/20 0735 08/20/20 0404 08/20/20 1157 08/20/20 1347 08/20/20 1423 08/20/20 2220 08/21/20 0326 08/21/20 0811 08/22/20 0351  HGB  --    < > 8.2*  --   --   --   --  7.9*  --  8.0*  HCT  --    < > 26.2*  --   --   --   --  26.0*  --  24.6*  PLT  --    < > 371  --   --   --   --  370  --  338  APTT 41*   < >  --    < > 144*  --  63*  --  74*  --   HEPARINUNFRC 1.14*   < >  --    < >  --    < > 0.35  --  0.35 0.21*  CREATININE 0.93  --  0.92  --   --   --   --  0.94  --   --    < > = values in this interval not displayed.    Estimated Creatinine Clearance: 46.4 mL/min (by C-G formula based on SCr of 0.94 mg/dL).  Assessment: 30 YOF with history of recent bilateral DVTs on Eliquis PTA, last dose 08/18/20.  Patient was transitioned to IV heparin for EGD with dilation on 08/21/20 >> normal.  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  Pharmacy consulted to resume IV heparin post EGD, aiming for the low goal due to bleeding risk/CVA.  3/11 AM update:  Heparin level low after re-start s/p procedure  Goal of Therapy:  Heparin level 0.3-0.5 units/mL Monitor platelets by anticoagulation protocol: Yes   Plan:  No bolus per MD  Inc heparin to 900 units/hr Check 8 hr heparin level Monitor closely for s/sx of bleeding F/u with transitioning to Eliquis  5/11, PharmD, BCPS Clinical Pharmacist Phone: [phone]

## 2020-08-22 NOTE — Plan of Care (Signed)
  Problem: Education: Goal: Knowledge of General Education information will improve Description: Including pain rating scale, medication(s)/side effects and non-pharmacologic comfort measures Outcome: Adequate for Discharge   Problem: Health Behavior/Discharge Planning: Goal: Ability to manage health-related needs will improve Outcome: Adequate for Discharge   Problem: Clinical Measurements: Goal: Ability to maintain clinical measurements within normal limits will improve Outcome: Adequate for Discharge Goal: Will remain free from infection Outcome: Adequate for Discharge Goal: Diagnostic test results will improve Outcome: Adequate for Discharge Goal: Respiratory complications will improve Outcome: Adequate for Discharge Goal: Cardiovascular complication will be avoided Outcome: Adequate for Discharge   Problem: Activity: Goal: Risk for activity intolerance will decrease Outcome: Adequate for Discharge   Problem: Nutrition: Goal: Adequate nutrition will be maintained Outcome: Adequate for Discharge   Problem: Coping: Goal: Level of anxiety will decrease Outcome: Adequate for Discharge   Problem: Elimination: Goal: Will not experience complications related to bowel motility Outcome: Adequate for Discharge Goal: Will not experience complications related to urinary retention Outcome: Adequate for Discharge   Problem: Pain Managment: Goal: General experience of comfort will improve Outcome: Adequate for Discharge   Problem: Safety: Goal: Ability to remain free from injury will improve Outcome: Adequate for Discharge   Problem: Skin Integrity: Goal: Risk for impaired skin integrity will decrease Outcome: Adequate for Discharge   Problem: Education: Goal: Knowledge of secondary prevention will improve Outcome: Adequate for Discharge Goal: Knowledge of patient specific risk factors addressed and post discharge goals established will improve Outcome: Adequate for  Discharge Goal: Individualized Educational Video(s) Outcome: Adequate for Discharge

## 2020-08-24 ENCOUNTER — Encounter (HOSPITAL_COMMUNITY): Payer: Self-pay | Admitting: Gastroenterology

## 2020-09-08 ENCOUNTER — Ambulatory Visit
Admission: RE | Admit: 2020-09-08 | Discharge: 2020-09-08 | Disposition: A | Discharge status: Home / Self Care | Payer: Medicare (Managed Care) | Source: Ambulatory Visit | Attending: Infectious Disease | Admitting: Infectious Disease

## 2020-09-08 ENCOUNTER — Other Ambulatory Visit: Payer: Self-pay | Admitting: *Deleted

## 2020-09-08 ENCOUNTER — Ambulatory Visit (INDEPENDENT_AMBULATORY_CARE_PROVIDER_SITE_OTHER): Payer: Medicare (Managed Care) | Admitting: Infectious Disease

## 2020-09-08 ENCOUNTER — Encounter: Payer: Self-pay | Admitting: Infectious Disease

## 2020-09-08 ENCOUNTER — Other Ambulatory Visit: Payer: Self-pay

## 2020-09-08 VITALS — BP 133/86 | HR 73 | Temp 97.9°F

## 2020-09-08 DIAGNOSIS — J850 Gangrene and necrosis of lung: Secondary | ICD-10-CM

## 2020-09-08 DIAGNOSIS — U071 COVID-19: Secondary | ICD-10-CM

## 2020-09-08 DIAGNOSIS — I639 Cerebral infarction, unspecified: Secondary | ICD-10-CM | POA: Diagnosis not present

## 2020-09-08 DIAGNOSIS — E878 Other disorders of electrolyte and fluid balance, not elsewhere classified: Secondary | ICD-10-CM | POA: Diagnosis not present

## 2020-09-08 DIAGNOSIS — R531 Weakness: Secondary | ICD-10-CM

## 2020-09-08 DIAGNOSIS — I824Y3 Acute embolism and thrombosis of unspecified deep veins of proximal lower extremity, bilateral: Secondary | ICD-10-CM

## 2020-09-08 DIAGNOSIS — J1282 Pneumonia due to coronavirus disease 2019: Secondary | ICD-10-CM

## 2020-09-08 NOTE — Patient Outreach (Signed)
[facility]) Care Management  09/08/2020  Beverly Howell 23-Mar-1941 [id number]   RED ON EMMI ALERT - Stroke Day # 13 Date: 3/26 Red Alert Reason: Not been to follow up appointment   Outreach attempt #1, successful to daughter (DPR on file).  Identity verified.  This care manager introduced self and stated purpose of call.  [facility] care management services explained.    Daughter report member had follow up appointment with PCP on this past Saturday, which call was placed prior to the appointment.  She also has follow up scheduled with neurology on 4/19 (J. McCue).  Unfortunately this provider is out of network for member, PCP is placing a new referral.  This care manager inquired about contacting insurance company directly to request list of providers in network, state she will do so.  Offered to follow up and assist with finding new neurologist, she declined.  Denies any urgent concerns, encouraged to contact this care manager with questions.    Plan: RN CM will send member successful outreach letter with this care manager's contact information.  Will close case as no further needs identified.  Kemper Durie, [place], MSN [facility] Care Management  [facility] Manager [phone]

## 2020-09-08 NOTE — Progress Notes (Signed)
Subjective:  Chief necrotizing pneumonia follow-up  Patient ID: Beverly Howell, female    DOB: [birth year], 80 y.o.   MRN: [id number]  HPI  80 y.o. female history of hypertension obesity who is unvaccinated against COVID-19 and admitted in November with a stroke bilateral DVTs and severe COVID pneumonia.  She ultimately recovered and was discharged from the hospital.  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  She is undergone T guided chest tube placement x2 by interventional radiology with purulent material obtained which is now grown methicillin sensitive Staph aureus  We treated her with unasyn in the hospital and DC on augmentin she is continued take since then.  She was scheduled for follow-up with Dr. Tyrone Sage but was hospitalized again now with weakness and acute bilateral DVTs.  She had multiple electrolyte abnormalities that time and also had an esophageal stricture that was dilated.  Remained on Augmentin since then.  Repeat chest x-ray done earlier this month on March 3 was done and I have reviewed the images and indeed it does still show effusion on the right side slightly loculated along with some residual air.  She is self only has occasional cough no fever no malaise and is gaining her weight.   Past Medical History:  Diagnosis Date  . High cholesterol   . Hypertension     Past Surgical History:  Procedure Laterality Date  . ESOPHAGOGASTRODUODENOSCOPY (EGD) WITH PROPOFOL N/A 08/21/2020   Procedure: ESOPHAGOGASTRODUODENOSCOPY (EGD) WITH PROPOFOL;  Surgeon: Jeani Hawking, MD;  Location: [facility] ENDOSCOPY;  Service: Endoscopy;  Laterality: N/A;  . HAND SURGERY    . SAVORY DILATION N/A 08/21/2020   Procedure: SAVORY DILATION;  Surgeon: Jeani Hawking, MD;  Location: [facility] ENDOSCOPY;  Service:  Endoscopy;  Laterality: N/A;    Family History  Problem Relation Age of Onset  . Stroke Mother       Social History   Socioeconomic History  . Marital status: Single    Spouse name: Not on file  . Number of children: Not on file  . Years of education: Not on file  . Highest education level: Not on file  Occupational History  . Not on file  Tobacco Use  . Smoking status: Former Games developer  . Smokeless tobacco: Never Used  Substance and Sexual Activity  . Alcohol use: No  . Drug use: No  . Sexual activity: Not on file  Other Topics Concern  . Not on file  Social History Narrative  . Not on file   Social Determinants of Health   Financial Resource Strain: Not on file  Food Insecurity: Not on file  Transportation Needs: Not on file  Physical Activity: Not on file  Stress: Not on file  Social Connections: Not on file    No Known Allergies   Current Outpatient Medications:  .  acetaminophen (TYLENOL) 500 MG tablet, Take 1,000 mg by mouth every 8 (eight) hours as needed for moderate pain., Disp: , Rfl:  .  albuterol (VENTOLIN HFA) 108 (90 Base) MCG/ACT inhaler, Inhale 2 puffs into the lungs every 8 (eight) hours as needed for wheezing. , Disp: , Rfl:  .  amoxicillin-clavulanate (AUGMENTIN) 875-125 MG tablet, Take 1 tablet by mouth 2 (two) times daily. Contineous, Disp: , Rfl:  .  apixaban (ELIQUIS) 2.5 MG TABS tablet, Take 1 tablet (2.5 mg  total) by mouth 2 (two) times daily for 3 days. After finishing this, please take your 5 mg eliquis twice daily as you did prior to your admission, Disp: 6 tablet, Rfl: 0 .  apixaban (ELIQUIS) 5 MG TABS tablet, Take 1 tablet (5 mg total) by mouth 2 (two) times daily. Start 08/25/20, Disp: 60 tablet, Rfl: 1 .  atorvastatin (LIPITOR) 40 MG tablet, Take 1 tablet (40 mg total) by mouth daily. (Patient not taking: No sig reported), Disp: , Rfl:  .  Calcium Carb-Cholecalciferol (CALCIUM 600-D PO), Take 1 tablet by mouth daily., Disp: , Rfl:  .   cholecalciferol (VITAMIN D) 25 MCG (1000 UNIT) tablet, Take 1,000 Units by mouth daily. , Disp: , Rfl:  .  guaiFENesin (MUCINEX) 600 MG 12 hr tablet, Take 1,200 mg by mouth 2 (two) times daily as needed for cough., Disp: , Rfl:  .  ipratropium-albuterol (DUONEB) 0.5-2.5 (3) MG/3ML SOLN, Take 3 mLs by nebulization 2 (two) times daily as needed (wheezing)., Disp: , Rfl:  .  lisinopril (ZESTRIL) 10 MG tablet, Take 10 mg by mouth daily., Disp: , Rfl:  .  metoprolol tartrate (LOPRESSOR) 25 MG tablet, Take 0.5 tablets (12.5 mg total) by mouth 2 (two) times daily., Disp: , Rfl:  .  Omega-3 Fatty Acids (FISH OIL TRIPLE STRENGTH) 1400 MG CAPS, Take 1,400 mg by mouth daily., Disp: , Rfl:  .  pantoprazole (PROTONIX) 40 MG tablet, Take 1 tablet (40 mg total) by mouth daily. (Patient not taking: No sig reported), Disp: , Rfl:  .  phenol (CHLORASEPTIC) 1.4 % LIQD, Use as directed 1 spray in the mouth or throat as needed for throat irritation / pain., Disp: 20 mL, Rfl: 0    Review of Systems  Constitutional: Negative for activity change, appetite change, chills, diaphoresis, fatigue, fever and unexpected weight change.  HENT: Negative for congestion, rhinorrhea, sinus pressure, sneezing, sore throat and trouble swallowing.   Eyes: Negative for photophobia and visual disturbance.  Respiratory: Negative for chest tightness, wheezing and stridor.   Cardiovascular: Negative for chest pain, palpitations and leg swelling.  Gastrointestinal: Negative for abdominal distention, abdominal pain, anal bleeding, blood in stool, constipation, diarrhea, nausea and vomiting.  Genitourinary: Negative for difficulty urinating, dysuria, flank pain and hematuria.  Musculoskeletal: Negative for arthralgias, back pain, gait problem, joint swelling and myalgias.  Skin: Negative for color change, pallor, rash and wound.  Neurological: Negative for dizziness, tremors, weakness and light-headedness.  Hematological: Negative for  adenopathy. Does not bruise/bleed easily.  Psychiatric/Behavioral: Negative for agitation, behavioral problems, confusion, decreased concentration, dysphoric mood and sleep disturbance.       Objective:   Physical Exam Constitutional:      General: She is not in acute distress.    Appearance: Normal appearance. She is well-developed. She is not ill-appearing or diaphoretic.  HENT:     Head: Normocephalic and atraumatic.     Right Ear: Hearing and external ear normal.     Left Ear: Hearing and external ear normal.     Nose: No nasal deformity or rhinorrhea.  Eyes:     General: No scleral icterus.    Conjunctiva/sclera: Conjunctivae normal.     Right eye: Right conjunctiva is not injected.     Left eye: Left conjunctiva is not injected.     Pupils: Pupils are equal, round, and reactive to light.  Neck:     Vascular: No JVD.  Cardiovascular:     Rate and Rhythm: Normal rate and regular rhythm.  Heart sounds: Normal heart sounds, S1 normal and S2 normal. No murmur heard. No friction rub.  Pulmonary:     Effort: Pulmonary effort is normal. No respiratory distress.     Breath sounds: Normal breath sounds. No stridor. No wheezing or rhonchi.  Abdominal:     General: Bowel sounds are normal. There is no distension.     Palpations: Abdomen is soft.     Tenderness: There is no abdominal tenderness.  Musculoskeletal:        General: Normal range of motion.     Right shoulder: Normal.     Left shoulder: Normal.     Cervical back: Normal range of motion and neck supple.     Right hip: Normal.     Left hip: Normal.     Right knee: Normal.     Left knee: Normal.  Lymphadenopathy:     Head:     Right side of head: No submandibular, preauricular or posterior auricular adenopathy.     Left side of head: No submandibular, preauricular or posterior auricular adenopathy.     Cervical: No cervical adenopathy.     Right cervical: No superficial or deep cervical adenopathy.    Left  cervical: No superficial or deep cervical adenopathy.  Skin:    General: Skin is warm and dry.     Coloration: Skin is not pale.     Findings: No abrasion, bruising, ecchymosis, erythema, lesion or rash.     Nails: There is no clubbing.  Neurological:     Mental Status: She is alert and oriented to person, place, and time.     Sensory: No sensory deficit.     Coordination: Coordination normal.     Gait: Gait normal.  Psychiatric:        Attention and Perception: She is attentive.        Mood and Affect: Mood normal.        Speech: Speech normal.        Behavior: Behavior normal. Behavior is cooperative.        Thought Content: Thought content normal.        Judgment: Judgment normal.           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  Chest x-ray is reassuring from early March we will repeat 1 today and repeat labs.  We will have her stop the antibiotics contingent that the labs are reassuring as is the chest x-ray and she can follow-up in 2 months time with me.  Esophageal stricture status post dilation  Severe COVID with resp failure Deep venous thromboses and CVA  Recent DVTs on anticoagulation some question of adherence to her prior anticoagulation

## 2020-09-09 LAB — BASIC METABOLIC PANEL WITH GFR
BUN/Creatinine Ratio: 9 (calc) (ref 6–22)
BUN: 9 mg/dL (ref 7–25)
CO2: 24 mmol/L (ref 20–32)
Calcium: 9.4 mg/dL (ref 8.6–10.4)
Chloride: 108 mmol/L (ref 98–110)
Creat: 1 mg/dL — ABNORMAL HIGH (ref 0.60–0.93)
GFR, Est African American: 62 mL/min/{1.73_m2} (ref >=60)
GFR, Est Non African American: 54 mL/min/{1.73_m2} — ABNORMAL LOW (ref >=60)
Glucose, Bld: 88 mg/dL (ref 65–99)
Potassium: 3.8 mmol/L (ref 3.5–5.3)
Sodium: 143 mmol/L (ref 135–146)

## 2020-09-09 LAB — CBC WITH DIFFERENTIAL/PLATELET
Absolute Monocytes: 792 cells/uL (ref 200–950)
Basophils Absolute: 30 cells/uL (ref 0–200)
Basophils Relative: 0.4 %
Eosinophils Absolute: 81 cells/uL (ref 15–500)
Eosinophils Relative: 1.1 %
HCT: 34 % — ABNORMAL LOW (ref 35.0–45.0)
Hemoglobin: 10.8 g/dL — ABNORMAL LOW (ref 11.7–15.5)
Lymphs Abs: 1739 cells/uL (ref 850–3900)
MCH: 30.2 pg (ref 27.0–33.0)
MCHC: 31.8 g/dL — ABNORMAL LOW (ref 32.0–36.0)
MCV: 95 fL (ref 80.0–100.0)
MPV: 10.9 fL (ref 7.5–12.5)
Monocytes Relative: 10.7 %
Neutro Abs: 4758 cells/uL (ref 1500–7800)
Neutrophils Relative %: 64.3 %
Platelets: 356 10*3/uL (ref 140–400)
RBC: 3.58 10*6/uL — ABNORMAL LOW (ref 3.80–5.10)
RDW: 16.5 % — ABNORMAL HIGH (ref 11.0–15.0)
Total Lymphocyte: 23.5 %
WBC: 7.4 10*3/uL (ref 3.8–10.8)

## 2020-09-09 LAB — C-REACTIVE PROTEIN: CRP: 1.2 mg/L (ref <8.0)

## 2020-09-09 LAB — SEDIMENTATION RATE: Sed Rate: 11 mm/h (ref 0–30)

## 2020-09-11 ENCOUNTER — Telehealth: Payer: Self-pay

## 2020-09-11 NOTE — Telephone Encounter (Signed)
Lets observe her

## 2020-09-11 NOTE — Telephone Encounter (Signed)
RN spoke with patient's daughter, [name]. Relayed to her that Dr. Daiva Eves would like to observe the patient for now. Advised Tonya to call us if anything changes. Emphasized importance of following up with pulmonary and surgeon. Tonya verbalized understanding.   Sandie Ano, RN

## 2020-09-11 NOTE — Telephone Encounter (Signed)
RN spoke with patient's daughter, [name]. She states that the patient is feeling well and has not had any complaints. They have stopped antibiotics. RN relayed that Dr. Daiva Eves would like for the patient to continue following up with Pulmonary. Tonya verbalized understanding.   Tonya asking about Xray results, RN relayed per Dr. Daiva Eves that there is still fluid present. Will route to provider.   Sandie Ano, RN

## 2020-09-11 NOTE — Telephone Encounter (Signed)
-----   Message from Randall Hiss, MD sent at 09/11/2020  9:07 AM EDT ----- Can we check how pt is doing I had told her we would stop antibiotics but I dont like the read on chest xray showing some more pleural fluid. We can restart antibiotics or observe her. I do not see that she has any followup with pulmonary or CT surgeon. I would like her to at minimum followup with Pulmonary

## 2020-09-30 ENCOUNTER — Inpatient Hospital Stay: Payer: Medicare (Managed Care) | Admitting: Adult Health

## 2020-10-22 ENCOUNTER — Other Ambulatory Visit: Payer: Self-pay | Admitting: Thoracic Surgery (Cardiothoracic Vascular Surgery)

## 2020-10-22 DIAGNOSIS — J948 Other specified pleural conditions: Secondary | ICD-10-CM

## 2020-10-24 ENCOUNTER — Encounter: Payer: Self-pay | Admitting: Thoracic Surgery (Cardiothoracic Vascular Surgery)

## 2020-10-24 ENCOUNTER — Ambulatory Visit
Admission: RE | Admit: 2020-10-24 | Discharge: 2020-10-24 | Disposition: A | Discharge status: Home / Self Care | Payer: Medicare (Managed Care) | Source: Ambulatory Visit | Attending: Thoracic Surgery (Cardiothoracic Vascular Surgery) | Admitting: Thoracic Surgery (Cardiothoracic Vascular Surgery)

## 2020-10-24 ENCOUNTER — Ambulatory Visit: Payer: Medicare (Managed Care) | Admitting: Thoracic Surgery (Cardiothoracic Vascular Surgery)

## 2020-10-24 ENCOUNTER — Other Ambulatory Visit: Payer: Self-pay

## 2020-10-24 VITALS — BP 180/85 | HR 66 | Resp 20 | Ht 63.0 in | Wt 161.0 lb

## 2020-10-24 DIAGNOSIS — J948 Other specified pleural conditions: Secondary | ICD-10-CM

## 2020-10-24 DIAGNOSIS — J90 Pleural effusion, not elsewhere classified: Secondary | ICD-10-CM | POA: Diagnosis not present

## 2020-10-24 NOTE — Progress Notes (Signed)
      [address]       [place] [address]             [phone]        [facility] Medical Record #[id number] Date of Birth: Aug 06, 1940  Referring: Elgergawy, Leana Roe, MD Primary Care: Darryl Lent, PA-C Primary Cardiologist:None  Reason for visit:   follow-up  History of Present Illness:     80 year old female presents today for follow-up for a right-sided effusion.  She was originally treated by Dr. Tyrone Sage for hydropneumothorax following COVID infection.  This was treated by pigtail drainage and talc pleurodesis.  She denies any respiratory symptoms and is overall doing well.  Physical Exam: BP (!) 180/85   Pulse 66   Resp 20   Ht 5\' 3"  (1.6 m)   Wt 161 lb (73 kg)   SpO2 99% Comment: RA  BMI 28.52 kg/m   Alert NAD Easy work of breathing Abdomen soft, ND No peripheral edema   Diagnostic Studies & Laboratory data: CXR: Stable     Assessment / Plan:   80 year old female with multiple medical problems who was treated for a hydropneumothorax January 2022 with pigtail drainage.  She has no respiratory symptoms imaging is stable.  Follow-up as needed.   February 2022 10/24/2020 4:34 PM

## 2020-11-18 ENCOUNTER — Ambulatory Visit: Payer: Medicare (Managed Care) | Admitting: Infectious Disease

## 2020-12-01 ENCOUNTER — Other Ambulatory Visit: Payer: Self-pay | Admitting: Infectious Disease

## 2020-12-22 ENCOUNTER — Ambulatory Visit: Payer: Medicare (Managed Care) | Admitting: Infectious Disease

## 2021-01-14 ENCOUNTER — Other Ambulatory Visit: Payer: Self-pay

## 2021-01-14 ENCOUNTER — Ambulatory Visit (INDEPENDENT_AMBULATORY_CARE_PROVIDER_SITE_OTHER): Payer: Medicare (Managed Care) | Admitting: Infectious Disease

## 2021-01-14 VITALS — BP 192/92 | HR 58 | Temp 97.4°F | Wt 155.0 lb

## 2021-01-14 DIAGNOSIS — I10 Essential (primary) hypertension: Secondary | ICD-10-CM

## 2021-01-14 DIAGNOSIS — U071 COVID-19: Secondary | ICD-10-CM | POA: Diagnosis not present

## 2021-01-14 DIAGNOSIS — Z86718 Personal history of other venous thrombosis and embolism: Secondary | ICD-10-CM

## 2021-01-14 DIAGNOSIS — J1282 Pneumonia due to coronavirus disease 2019: Secondary | ICD-10-CM

## 2021-01-14 DIAGNOSIS — J850 Gangrene and necrosis of lung: Secondary | ICD-10-CM | POA: Diagnosis not present

## 2021-01-14 DIAGNOSIS — I639 Cerebral infarction, unspecified: Secondary | ICD-10-CM

## 2021-01-14 NOTE — Progress Notes (Signed)
Subjective:  Chief necrotizing pneumonia follow-up  Patient ID: Beverly Howell, female    DOB: 06-Oct-1940, 80 y.o.   MRN: [id number]  HPI  80 y.o. female history of hypertension obesity who is unvaccinated against COVID-19 and admitted in November with a stroke bilateral DVTs and severe COVID pneumonia.  She ultimately recovered and was discharged from the hospital.   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  She is undergone T guided chest tube placement x2 by interventional radiology with purulent material obtained which is now grown methicillin sensitive Staph aureus  We treated her with unasyn in the hospital and DC on augmentin she is continued take since then.  She was scheduled for follow-up with Dr. Tyrone Sage but was hospitalized again now with weakness and acute bilateral DVTs.  She had multiple electrolyte abnormalities that time and also had an esophageal stricture that was dilated.  She had remained on Augmentin since then until her visit with me in March 2022  Repeat chest x-ray done earlier this month on March 3 was done and I have reviewed the images and indeed it does still show effusion on the right side slightly loculated along with some residual air. At that time she only had an occasional cough.  I plan on seeing her 2 months later but ended up now seeing her in August.  In the interim she was seen by cardiothoracic surgery and Dr. Cliffton Asters.  Dr. Cliffton Asters obtained a chest x-ray showed a small right pleural effusion and no pneumothorax.  He has released her from needing to follow-up with him.  She seems to be doing well today has had no problems with fevers chills myalgias malaise.  She is not been coughing.  She apparently has not been vaccinated gets COVID-19 so I offered to give her  a Pfizer #1 today.     Past Medical History:  Diagnosis Date   High cholesterol    Hypertension     Past Surgical History:  Procedure Laterality Date   ESOPHAGOGASTRODUODENOSCOPY (EGD) WITH PROPOFOL N/A 08/21/2020   Procedure: ESOPHAGOGASTRODUODENOSCOPY (EGD) WITH PROPOFOL;  Surgeon: Jeani Hawking, MD;  Location: [facility] ENDOSCOPY;  Service: Endoscopy;  Laterality: N/A;   HAND SURGERY     SAVORY DILATION N/A 08/21/2020   Procedure: SAVORY DILATION;  Surgeon: Jeani Hawking, MD;  Location: [facility] ENDOSCOPY;  Service: Endoscopy;  Laterality: N/A;    Family History  Problem Relation Age of Onset   Stroke Mother       Social History   Socioeconomic History   Marital status: Single    Spouse name: Not on file   Number of children: Not on file   Years of education: Not on file   Highest education level: Not on file  Occupational History   Not on file  Tobacco Use   Smoking status: Former   Smokeless tobacco: Never  Substance and Sexual Activity   Alcohol use: No   Drug use: No   Sexual activity: Not on file  Other Topics Concern   Not on file  Social History Narrative   Not on file   Social Determinants of Health   Financial Resource Strain: Not on file  Food Insecurity: Not on file  Transportation Needs: Not on file  Physical Activity: Not on file  Stress: Not on file  Social Connections: Not  on file    No Known Allergies   Current Outpatient Medications:    acetaminophen (TYLENOL) 500 MG tablet, Take 1,000 mg by mouth every 8 (eight) hours as needed for moderate pain., Disp: , Rfl:    albuterol (VENTOLIN HFA) 108 (90 Base) MCG/ACT inhaler, Inhale 2 puffs into the lungs every 8 (eight) hours as needed for wheezing. , Disp: , Rfl:    apixaban (ELIQUIS) 2.5 MG TABS tablet, Take 1 tablet (2.5 mg total) by mouth 2 (two) times daily for 3 days. After finishing this, please take your 5 mg eliquis twice daily as you did prior to your admission, Disp: 6 tablet, Rfl: 0    apixaban (ELIQUIS) 5 MG TABS tablet, Take 1 tablet (5 mg total) by mouth 2 (two) times daily. Start 08/25/20, Disp: 60 tablet, Rfl: 1   atorvastatin (LIPITOR) 40 MG tablet, Take 1 tablet (40 mg total) by mouth daily. (Patient not taking: Reported on 10/24/2020), Disp: , Rfl:    Calcium Carb-Cholecalciferol (CALCIUM 600-D PO), Take 1 tablet by mouth daily., Disp: , Rfl:    cholecalciferol (VITAMIN D) 25 MCG (1000 UNIT) tablet, Take 1,000 Units by mouth daily. , Disp: , Rfl:    DILT-XR 240 MG 24 hr capsule, Take 240 mg by mouth daily., Disp: , Rfl:    guaiFENesin (MUCINEX) 600 MG 12 hr tablet, Take 1,200 mg by mouth 2 (two) times daily as needed for cough., Disp: , Rfl:    ipratropium-albuterol (DUONEB) 0.5-2.5 (3) MG/3ML SOLN, Take 3 mLs by nebulization 2 (two) times daily as needed (wheezing)., Disp: , Rfl:    lisinopril (ZESTRIL) 10 MG tablet, Take 10 mg by mouth daily., Disp: , Rfl:    metoprolol tartrate (LOPRESSOR) 100 MG tablet, Take 100 mg by mouth 2 (two) times daily., Disp: , Rfl:    metoprolol tartrate (LOPRESSOR) 25 MG tablet, Take 0.5 tablets (12.5 mg total) by mouth 2 (two) times daily., Disp: , Rfl:    Omega-3 Fatty Acids (FISH OIL TRIPLE STRENGTH) 1400 MG CAPS, Take 1,400 mg by mouth daily., Disp: , Rfl:     Review of Systems  Constitutional:  Negative for activity change, appetite change, chills, diaphoresis, fatigue, fever and unexpected weight change.  HENT:  Negative for congestion, rhinorrhea, sinus pressure, sneezing, sore throat and trouble swallowing.   Eyes:  Negative for photophobia and visual disturbance.  Respiratory:  Negative for cough, chest tightness, shortness of breath, wheezing and stridor.   Cardiovascular:  Negative for chest pain, palpitations and leg swelling.  Gastrointestinal:  Negative for abdominal distention, abdominal pain, anal bleeding, blood in stool, constipation, diarrhea, nausea and vomiting.  Genitourinary:  Negative for difficulty urinating,  dysuria, flank pain and hematuria.  Musculoskeletal:  Negative for arthralgias, back pain, gait problem, joint swelling and myalgias.  Skin:  Negative for color change, pallor, rash and wound.  Neurological:  Negative for dizziness, tremors, weakness, light-headedness and headaches.  Hematological:  Negative for adenopathy. Does not bruise/bleed easily.  Psychiatric/Behavioral:  Negative for agitation, behavioral problems, confusion, decreased concentration, dysphoric mood, sleep disturbance and suicidal ideas.       Objective:   Physical Exam Constitutional:      General: She is not in acute distress.    Appearance: Normal appearance. She is well-developed. She is not ill-appearing or diaphoretic.  HENT:     Head: Normocephalic and atraumatic.     Right Ear: Hearing and external ear normal.     Left Ear: Hearing and external ear normal.  Nose: No nasal deformity or rhinorrhea.  Eyes:     General: No scleral icterus.    Extraocular Movements: Extraocular movements intact.     Conjunctiva/sclera: Conjunctivae normal.     Right eye: Right conjunctiva is not injected.     Left eye: Left conjunctiva is not injected.     Pupils: Pupils are equal, round, and reactive to light.  Neck:     Vascular: No JVD.  Cardiovascular:     Rate and Rhythm: Normal rate and regular rhythm.     Heart sounds: Normal heart sounds, S1 normal and S2 normal. No murmur heard.   No friction rub.  Pulmonary:     Effort: No respiratory distress.     Breath sounds: No stridor. No wheezing.  Abdominal:     General: Bowel sounds are normal. There is no distension.     Palpations: Abdomen is soft.     Tenderness: There is no abdominal tenderness.  Musculoskeletal:        General: Normal range of motion.     Right shoulder: Normal.     Left shoulder: Normal.     Cervical back: Normal range of motion and neck supple.     Right hip: Normal.     Left hip: Normal.     Right knee: Normal.     Left knee:  Normal.  Lymphadenopathy:     Head:     Right side of head: No submandibular, preauricular or posterior auricular adenopathy.     Left side of head: No submandibular, preauricular or posterior auricular adenopathy.     Cervical: No cervical adenopathy.     Right cervical: No superficial or deep cervical adenopathy.    Left cervical: No superficial or deep cervical adenopathy.  Skin:    General: Skin is warm and dry.     Coloration: Skin is not pale.     Findings: No abrasion, bruising, ecchymosis, erythema, lesion or rash.     Nails: There is no clubbing.  Neurological:     General: No focal deficit present.     Mental Status: She is alert and oriented to person, place, and time.     Sensory: No sensory deficit.     Coordination: Coordination normal.     Gait: Gait normal.  Psychiatric:        Attention and Perception: She is attentive.        Mood and Affect: Mood normal.        Speech: Speech normal.        Behavior: Behavior normal. Behavior is cooperative.        Thought Content: Thought content normal.        Judgment: Judgment normal.          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  I have reviewed her chest x-ray from May 2022 and I agree that there is a finding of a pleural effusion present though small  'I have reviewed Dr. Lucilla LameLightfoot's note with CT surgery from May as well.  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  Asked her to see her primary care physician  Vitals:   01/14/21 0832  BP: (!) 192/92  Pulse: (!) 58  Temp: (!) 97.4 F (36.3 C)     Hx  of severe COVID: Counseled her to be vaccinated against COVID-19 to avoid potential severe consequences as she suffered with her first infection with this virus.   Prepared the vaccine to give her but she then needed to leave to make another appointment.

## 2021-02-09 ENCOUNTER — Emergency Department (HOSPITAL_COMMUNITY)
Admission: EM | Admit: 2021-02-09 | Discharge: 2021-02-09 | Disposition: A | Discharge status: Home / Self Care | Payer: Medicare (Managed Care) | Attending: Emergency Medicine | Admitting: Emergency Medicine

## 2021-02-09 ENCOUNTER — Encounter (HOSPITAL_COMMUNITY): Payer: Self-pay

## 2021-02-09 ENCOUNTER — Other Ambulatory Visit: Payer: Self-pay

## 2021-02-09 DIAGNOSIS — Z79899 Other long term (current) drug therapy: Secondary | ICD-10-CM | POA: Insufficient documentation

## 2021-02-09 DIAGNOSIS — R112 Nausea with vomiting, unspecified: Secondary | ICD-10-CM | POA: Diagnosis not present

## 2021-02-09 DIAGNOSIS — Z87891 Personal history of nicotine dependence: Secondary | ICD-10-CM | POA: Insufficient documentation

## 2021-02-09 DIAGNOSIS — I10 Essential (primary) hypertension: Secondary | ICD-10-CM | POA: Diagnosis not present

## 2021-02-09 DIAGNOSIS — R42 Dizziness and giddiness: Secondary | ICD-10-CM | POA: Insufficient documentation

## 2021-02-09 DIAGNOSIS — R531 Weakness: Secondary | ICD-10-CM | POA: Insufficient documentation

## 2021-02-09 DIAGNOSIS — Z8616 Personal history of COVID-19: Secondary | ICD-10-CM | POA: Diagnosis not present

## 2021-02-09 DIAGNOSIS — Z7901 Long term (current) use of anticoagulants: Secondary | ICD-10-CM | POA: Diagnosis not present

## 2021-02-09 LAB — CBC WITH DIFFERENTIAL/PLATELET
Abs Immature Granulocytes: 0.03 10*3/uL (ref 0.00–0.07)
Basophils Absolute: 0 10*3/uL (ref 0.0–0.1)
Basophils Relative: 0 %
Eosinophils Absolute: 0.1 10*3/uL (ref 0.0–0.5)
Eosinophils Relative: 1 %
HCT: 38.9 % (ref 36.0–46.0)
Hemoglobin: 12.6 g/dL (ref 12.0–15.0)
Immature Granulocytes: 0 %
Lymphocytes Relative: 24 %
Lymphs Abs: 2.3 10*3/uL (ref 0.7–4.0)
MCH: 30.4 pg (ref 26.0–34.0)
MCHC: 32.4 g/dL (ref 30.0–36.0)
MCV: 93.7 fL (ref 80.0–100.0)
Monocytes Absolute: 0.6 10*3/uL (ref 0.1–1.0)
Monocytes Relative: 6 %
Neutro Abs: 6.6 10*3/uL (ref 1.7–7.7)
Neutrophils Relative %: 69 %
Platelets: 297 10*3/uL (ref 150–400)
RBC: 4.15 MIL/uL (ref 3.87–5.11)
RDW: 14.3 % (ref 11.5–15.5)
WBC: 9.6 10*3/uL (ref 4.0–10.5)
nRBC: 0 % (ref 0.0–0.2)

## 2021-02-09 LAB — URINALYSIS, ROUTINE W REFLEX MICROSCOPIC
Bacteria, UA: NONE SEEN
Bilirubin Urine: NEGATIVE
Glucose, UA: NEGATIVE mg/dL
Hgb urine dipstick: NEGATIVE
Ketones, ur: NEGATIVE mg/dL
Nitrite: NEGATIVE
Protein, ur: NEGATIVE mg/dL
Specific Gravity, Urine: 1.016 (ref 1.005–1.030)
pH: 5 (ref 5.0–8.0)

## 2021-02-09 LAB — COMPREHENSIVE METABOLIC PANEL
ALT: 16 U/L (ref 0–44)
AST: 28 U/L (ref 15–41)
Albumin: 3.7 g/dL (ref 3.5–5.0)
Alkaline Phosphatase: 54 U/L (ref 38–126)
Anion gap: 9 (ref 5–15)
BUN: 21 mg/dL (ref 8–23)
CO2: 27 mmol/L (ref 22–32)
Calcium: 9.7 mg/dL (ref 8.9–10.3)
Chloride: 104 mmol/L (ref 98–111)
Creatinine, Ser: 1.15 mg/dL — ABNORMAL HIGH (ref 0.44–1.00)
GFR, Estimated: 48 mL/min — ABNORMAL LOW (ref >60)
Glucose, Bld: 133 mg/dL — ABNORMAL HIGH (ref 70–99)
Potassium: 3.9 mmol/L (ref 3.5–5.1)
Sodium: 140 mmol/L (ref 135–145)
Total Bilirubin: 1.5 mg/dL — ABNORMAL HIGH (ref 0.3–1.2)
Total Protein: 6.8 g/dL (ref 6.5–8.1)

## 2021-02-09 LAB — LIPASE, BLOOD: Lipase: 29 U/L (ref 11–51)

## 2021-02-09 NOTE — ED Notes (Signed)
Spoke to pt daughter [name] who is coming at [phone] to pick up pt. Explained that pt will be waiting in the waiting room until picked up by daughter.

## 2021-02-09 NOTE — ED Notes (Signed)
Pt is here for dizziness and 1 vomiting episode this AM. Pt reports feeling weak today. Dizziness and nausea has subsided. Pt feels better at this time. Alert and oriented x 4. Cardiac monitoring in place.

## 2021-02-09 NOTE — Discharge Instructions (Addendum)
You have been seen in the ED for an episode of vertigo. Your labs and imaging all returned normal. Please follow up with your PCP. Please return to ED if symptoms worsen.

## 2021-02-09 NOTE — ED Notes (Signed)
PO challenge initiated with [place] sandwich, applesauce and ginger ale. Pt denies nausea at this time and reports she feels better. Alert and oriented x 4. Will continue to monitor.

## 2021-02-09 NOTE — ED Triage Notes (Signed)
Patient had dizziness this am and after eating had 1 episode of vomiting. Patient alert and oriented and states that she now feels fine.NAD

## 2021-02-09 NOTE — ED Provider Notes (Signed)
[facility] EMERGENCY DEPARTMENT Provider Note   CSN: [id number] Arrival date & time: 02/09/21  0935     History No chief complaint on file.   Beverly Howell is a 80 y.o. female.  Past medical history of CVA, hypertension, DVT.   This morning patient was eating breakfast and she developed dizziness.  She describes the dizziness as room spinning and feeling weak.  She then proceeded to vomit non-bilious emesis 2 times.  Denies any blood.  She started feeling better after she vomited.  She has had not had episodes like this in the past.  She states that she now feels back to normal.  Patient denies any chest pain, shortness of breath, abdominal pain, diarrhea, constipation.       Past Medical History:  Diagnosis Date   High cholesterol    Hypertension     Patient Active Problem List   Diagnosis Date Noted   Weakness 08/18/2020   CVA (cerebral vascular accident) (HCC) 08/15/2020   Hypokalemia 08/14/2020   Necrotizing pneumonia (HCC) 05/20/2020   History of CVA in adulthood 05/20/2020   History of DVT of lower extremity 05/20/2020   Acute CVA (cerebrovascular accident) (HCC) 04/22/2020   Pneumonia due to COVID-19 virus 04/22/2020   ARF (acute renal failure) (HCC) 04/22/2020   Essential hypertension 04/22/2020   Acute respiratory failure (HCC) 04/21/2020    Past Surgical History:  Procedure Laterality Date   ESOPHAGOGASTRODUODENOSCOPY (EGD) WITH PROPOFOL N/A 08/21/2020   Procedure: ESOPHAGOGASTRODUODENOSCOPY (EGD) WITH PROPOFOL;  Surgeon: Jeani Hawking, MD;  Location: [facility] ENDOSCOPY;  Service: Endoscopy;  Laterality: N/A;   HAND SURGERY     SAVORY DILATION N/A 08/21/2020   Procedure: SAVORY DILATION;  Surgeon: Jeani Hawking, MD;  Location: [facility] ENDOSCOPY;  Service: Endoscopy;  Laterality: N/A;     OB History   No obstetric history on file.     Family History  Problem Relation Age of Onset   Stroke Mother     Social History   Tobacco Use   Smoking  status: Former   Smokeless tobacco: Never  Substance Use Topics   Alcohol use: No   Drug use: No    Home Medications Prior to Admission medications   Medication Sig Start Date End Date Taking? Authorizing Provider  acetaminophen (TYLENOL) 500 MG tablet Take 1,000 mg by mouth every 8 (eight) hours as needed for moderate pain.    [provider]  albuterol (VENTOLIN HFA) 108 (90 Base) MCG/ACT inhaler Inhale 2 puffs into the lungs every 8 (eight) hours as needed for wheezing.  05/01/20   [provider]  apixaban (ELIQUIS) 2.5 MG TABS tablet Take 1 tablet (2.5 mg total) by mouth 2 (two) times daily for 3 days. After finishing this, please take your 5 mg eliquis twice daily as you did prior to your admission 08/22/20 08/25/20  Belva Agee, MD  apixaban (ELIQUIS) 5 MG TABS tablet Take 1 tablet (5 mg total) by mouth 2 (two) times daily. Start 08/25/20 08/25/20   Belva Agee, MD  atorvastatin (LIPITOR) 40 MG tablet Take 1 tablet (40 mg total) by mouth daily. Patient not taking: Reported on 01/14/2021 04/29/20   Maretta Bees, MD  Calcium Carb-Cholecalciferol (CALCIUM 600-D PO) Take 1 tablet by mouth daily.    [provider]  cholecalciferol (VITAMIN D) 25 MCG (1000 UNIT) tablet Take 1,000 Units by mouth daily.     [provider]  DILT-XR 240 MG 24 hr capsule Take 240 mg by mouth daily.  10/04/20   [provider]  guaiFENesin (MUCINEX) 600 MG 12 hr tablet Take 1,200 mg by mouth 2 (two) times daily as needed for cough.    [provider]  ipratropium-albuterol (DUONEB) 0.5-2.5 (3) MG/3ML SOLN Take 3 mLs by nebulization 2 (two) times daily as needed (wheezing). 05/12/20   [provider]  lisinopril (ZESTRIL) 10 MG tablet Take 10 mg by mouth daily.    [provider]  metoprolol tartrate (LOPRESSOR) 100 MG tablet Take 100 mg by mouth 2 (two) times daily. 09/23/20   [provider]  metoprolol tartrate  (LOPRESSOR) 25 MG tablet Take 0.5 tablets (12.5 mg total) by mouth 2 (two) times daily. Patient not taking: Reported on 01/14/2021 08/22/20   Belva Agee, MD  Omega-3 Fatty Acids (FISH OIL TRIPLE STRENGTH) 1400 MG CAPS Take 1,400 mg by mouth daily.    [provider]    Allergies    Patient has no known allergies.  Review of Systems   Review of Systems  Constitutional:  Negative for chills and fever.  Eyes:  Negative for visual disturbance.  Respiratory:  Negative for cough, chest tightness and shortness of breath.   Cardiovascular:  Negative for chest pain, palpitations and leg swelling.  Gastrointestinal:  Positive for nausea and vomiting. Negative for abdominal pain, constipation and diarrhea.  Skin:  Negative for rash and wound.  Neurological:  Positive for dizziness and weakness. Negative for syncope, light-headedness and headaches.  All other systems reviewed and are negative.  Physical Exam Updated Vital Signs BP (!) 185/74 (BP Location: Right Arm)   Pulse (!) 52   Temp 98 F (36.7 C) (Oral)   Resp 15   SpO2 96%   Physical Exam Vitals and nursing note reviewed.  Constitutional:      General: She is not in acute distress.    Appearance: Normal appearance. She is not ill-appearing, toxic-appearing or diaphoretic.  HENT:     Head: Normocephalic and atraumatic.     Mouth/Throat:     Mouth: Mucous membranes are moist.     Pharynx: Oropharynx is clear. No oropharyngeal exudate or posterior oropharyngeal erythema.  Eyes:     General: No scleral icterus.       Right eye: No discharge.        Left eye: No discharge.     Extraocular Movements: Extraocular movements intact.     Conjunctiva/sclera: Conjunctivae normal.  Cardiovascular:     Rate and Rhythm: Normal rate and regular rhythm.     Pulses: Normal pulses.     Heart sounds: Normal heart sounds, S1 normal and S2 normal. No murmur heard.   No friction rub. No gallop.  Pulmonary:     Effort: Pulmonary  effort is normal. No respiratory distress.     Breath sounds: Normal breath sounds. No wheezing, rhonchi or rales.  Abdominal:     General: Abdomen is flat. Bowel sounds are normal. There is no distension.     Palpations: Abdomen is soft. There is no pulsatile mass.     Tenderness: There is no abdominal tenderness. There is no guarding or rebound.  Musculoskeletal:     Right lower leg: No edema.     Left lower leg: No edema.  Skin:    General: Skin is warm and dry.     Coloration: Skin is not jaundiced.     Findings: No bruising, erythema, lesion or rash.  Neurological:     General: No focal deficit present.  Mental Status: She is alert and oriented to person, place, and time.     Cranial Nerves: No cranial nerve deficit.     Sensory: No sensory deficit.     Motor: No weakness.     Gait: Gait normal.  Psychiatric:        Mood and Affect: Mood normal.        Behavior: Behavior normal.    ED Results / Procedures / Treatments   Labs (all labs ordered are listed, but only abnormal results are displayed) Labs Reviewed  COMPREHENSIVE METABOLIC PANEL - Abnormal; Notable for the following components:      Result Value   Glucose, Bld 133 (*)    Creatinine, Ser 1.15 (*)    Total Bilirubin 1.5 (*)    GFR, Estimated 48 (*)    All other components within normal limits  URINALYSIS, ROUTINE W REFLEX MICROSCOPIC - Abnormal; Notable for the following components:   Leukocytes,Ua TRACE (*)    All other components within normal limits  CBC WITH DIFFERENTIAL/PLATELET  LIPASE, BLOOD    EKG EKG Interpretation  Date/Time:  Monday February 09 2021 10:04:29 EDT Ventricular Rate:  83 PR Interval:    QRS Duration: 128 QT Interval:  428 QTC Calculation: 502 R Axis:   -21 Text Interpretation: Wide QRS rhythm with occasional Premature ventricular complexes Right bundle branch block Abnormal ECG Confirmed by Kristine RoyalMessick, Peter [phone]([id number]) on 02/09/2021 2:30:59 PM  Radiology No results  found.  Procedures Procedures   Medications Ordered in ED Medications - No data to display  ED Course  I have reviewed the triage vital signs and the nursing notes.  Pertinent labs & imaging results that were available during my care of the patient were reviewed by me and considered in my medical decision making (see chart for details).    MDM Rules/Calculators/A&P                           Patient presents to the ED with complaints of one episode of vertigo with associated vomiting that was isolated in nature. Well appearing, Appears to be in no acute distress.  Patient is hypertensive with no signs of end organ damage. I reviewed past records and her BP today is consistent with past Bps while outpatient. She is afebrile. Saturating well on room air. Exam is unremarkable.  No abdominal tenderness noted on exam.  Patient feels that she is back to her baseline.  I personally reviewed all laboratory work and imaging. Minimal elevation of creatinine and t bili on labs.  UA normal. No leukocytosis or anemia. No significant electrolyte disturbance.   Dr. Rodena MedinMessick and I discussed the possibility of imaging, however, since the episode this morning seemed to be an isolated and transient event, we decided to defer imaging for now. We discussed this with the patient and she agreed to return if her symptom return or worsen.   She passed PO and fluid challenge prior to discharge. She was able to ambulate with no return of symptoms. She is given return precautions if her symptoms return or worsen. At that point we will do further workup.    Portions of this note were generated with Scientist, clinical (histocompatibility and immunogenetics)Dragon dictation software. Dictation errors may occur despite best attempts at proofreading.  Final Clinical Impression(s) / ED Diagnoses Final diagnoses:  Vertigo    Rx / DC Orders ED Discharge Orders     None        Sharnika Binney, Delorise ShinerGrace  Jeanella Craze 02/09/21 1537    Wynetta Fines, MD 02/10/21 1007

## 2021-02-09 NOTE — ED Provider Notes (Signed)
Emergency Medicine Provider Triage Evaluation Note  Beverly Howell , a 80 y.o. female  was evaluated in triage.  Pt complains of weakness.  Review of Systems  Positive: Nausea, vomiting, weakness and lightheadedness Negative: Fever, cp, abd pain, sob  Physical Exam  BP (!) 194/81 (BP Location: Right Arm)   Pulse (!) 52   Temp 98.1 F (36.7 C) (Oral)   Resp 14   SpO2 93%  Gen:   Awake, no distress   Resp:  Normal effort  MSK:   Moves extremities without difficulty  Other:    Medical Decision Making  Medically screening exam initiated at 9:56 AM.  Appropriate orders placed.  Beverly Howell was informed that the remainder of the evaluation will be completed by another provider, this initial triage assessment does not replace that evaluation, and the importance of remaining in the ED until their evaluation is complete.  Pt ate breakfast this AM but then felt nauseous, vomited twice and felt weak and ligheadedness.  This lasted for an hr and has since resolved.     Beverly Helper, PA-C 02/09/21 0957    Beverly Manchester, MD 02/11/21 [phone]

## 2022-03-03 IMAGING — DX DG CHEST 1V PORT
1 series · 1 of 1 positions shown · non-contrast
Comparison: Yesterday

CLINICAL DATA: Chest tube

EXAM:
PORTABLE CHEST 1 VIEW

[chest]
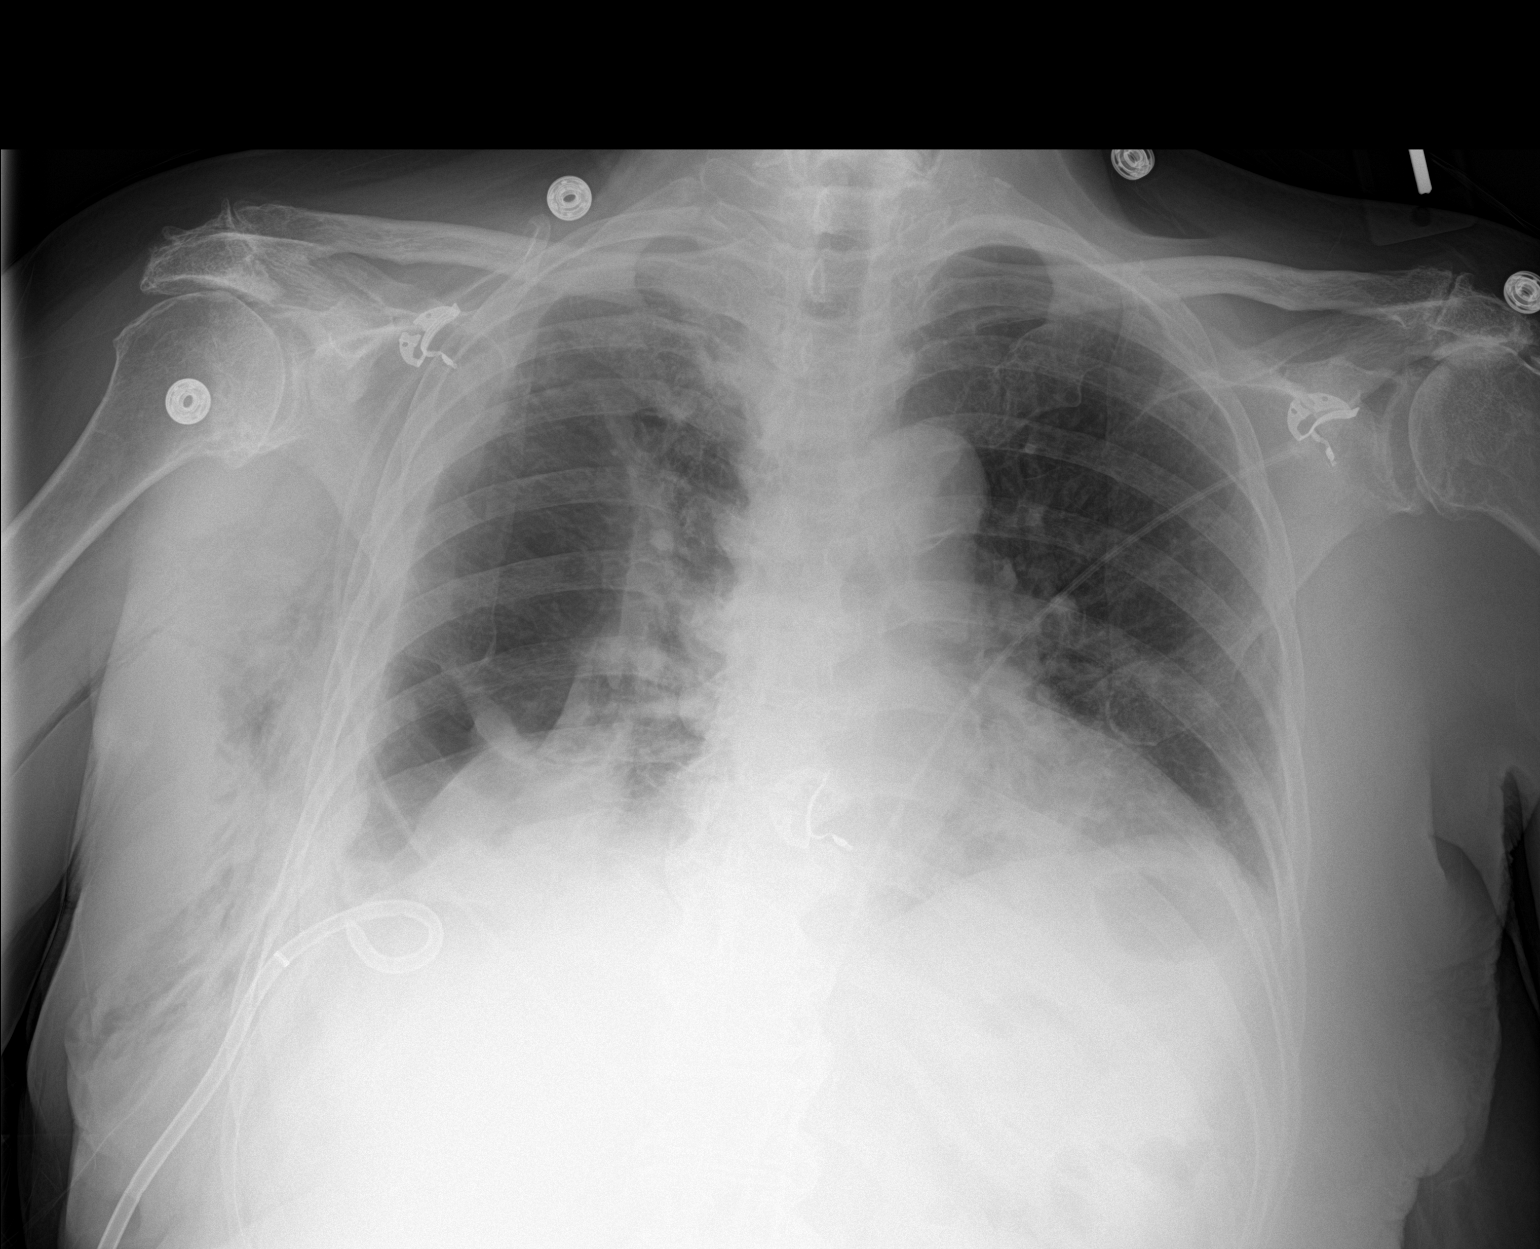

[1 of 1 positions shown; findings below may reference images not displayed]

FINDINGS: One of 2 small bore chest tubes remain in similar position near the
diaphragm. There is new right chest wall emphysema. Relative lucency
over the right lung which correlates with a loculated cavity by most
recent chest CT. The adjacent compressed/consolidated lung is likely
stable. Low volume left chest with mild streaky density at the
bases, stable. Normal heart size.
IMPRESSION: Interval removal of 1 of the right chest tubes with new chest wall
emphysema but stable appearing cavity.

## 2022-03-05 IMAGING — DX DG CHEST 2V
2 series · 2 of 2 positions shown · non-contrast
Comparison: 06/04/2020

CLINICAL DATA: Chest tube removal

EXAM:
CHEST - 2 VIEW

[chest lat]
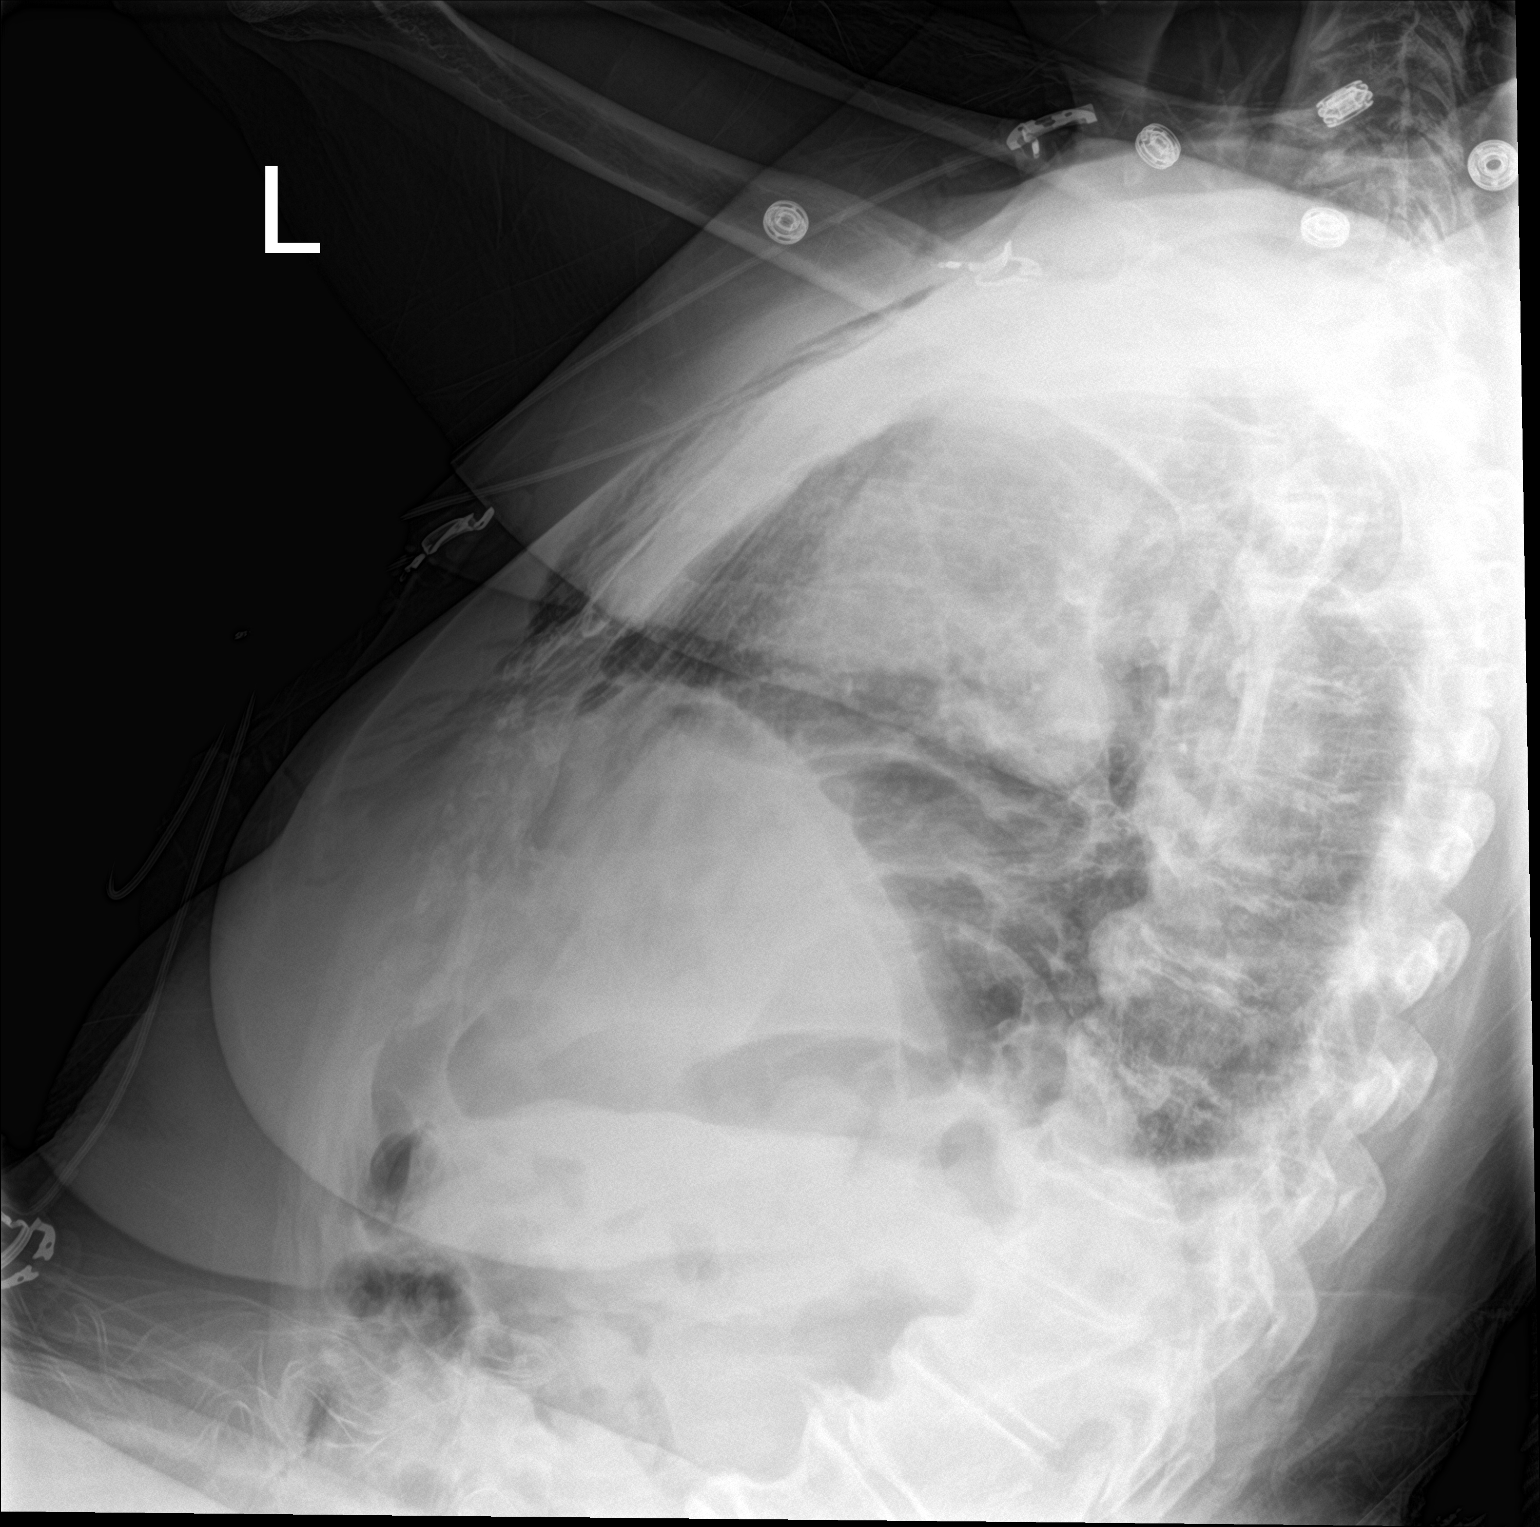

[chest ap]
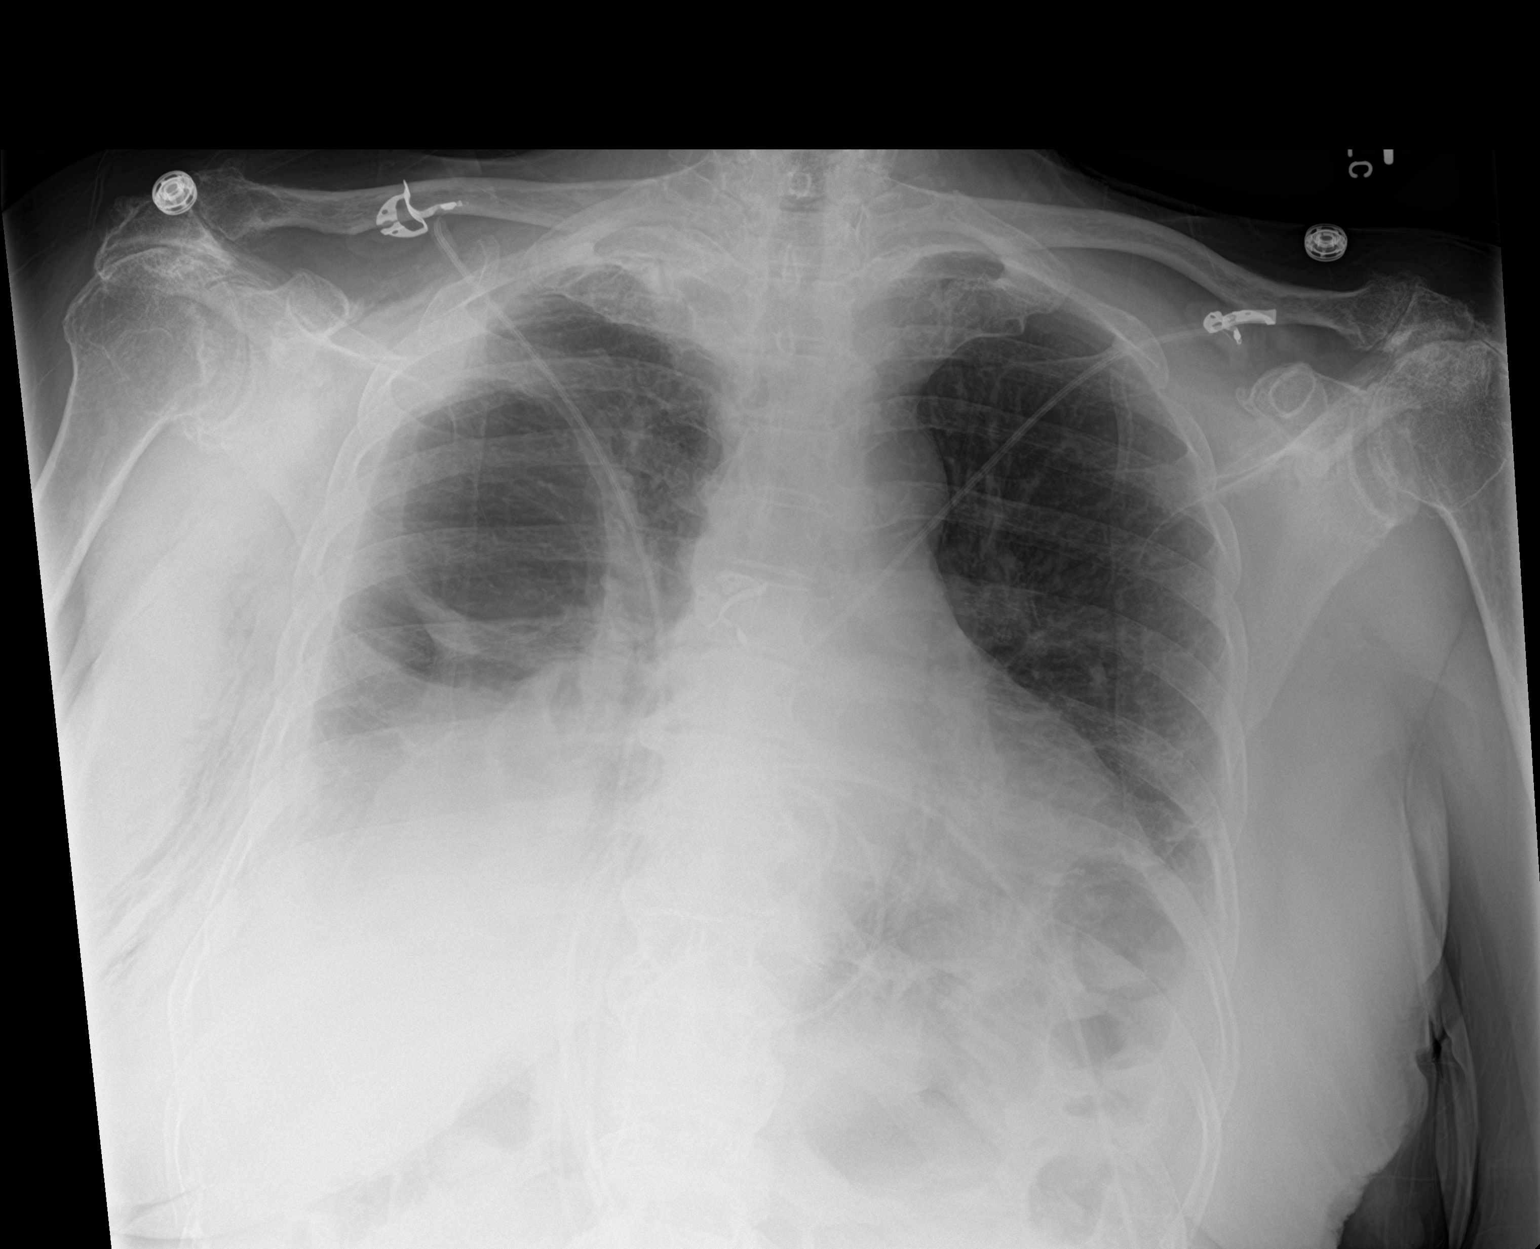

[2 of 2 positions shown; findings below may reference images not displayed]

FINDINGS: Chest tube is been removed from the inferior right chest. There is
slight worsening of volume loss at both lung bases. On the right,
loculated pleural air space remains present. This has not apparently
increased or developed more mass effect. Pleural density on the
right appears similar.
IMPRESSION: 1. Chest tube removed from the inferior right chest. Slight
worsening of volume loss at both lung bases.
2. No change in loculated right pleural air space and right pleural
fluid.

## 2022-05-14 IMAGING — CR DG CHEST 2V
2 series · 2 of 2 positions shown · non-contrast
Comparison: [DATE] [DATE], [DATE].  [DATE] [DATE], [DATE].

CLINICAL DATA: Syncope, weakness.

EXAM:
CHEST - 2 VIEW

[chest lat]
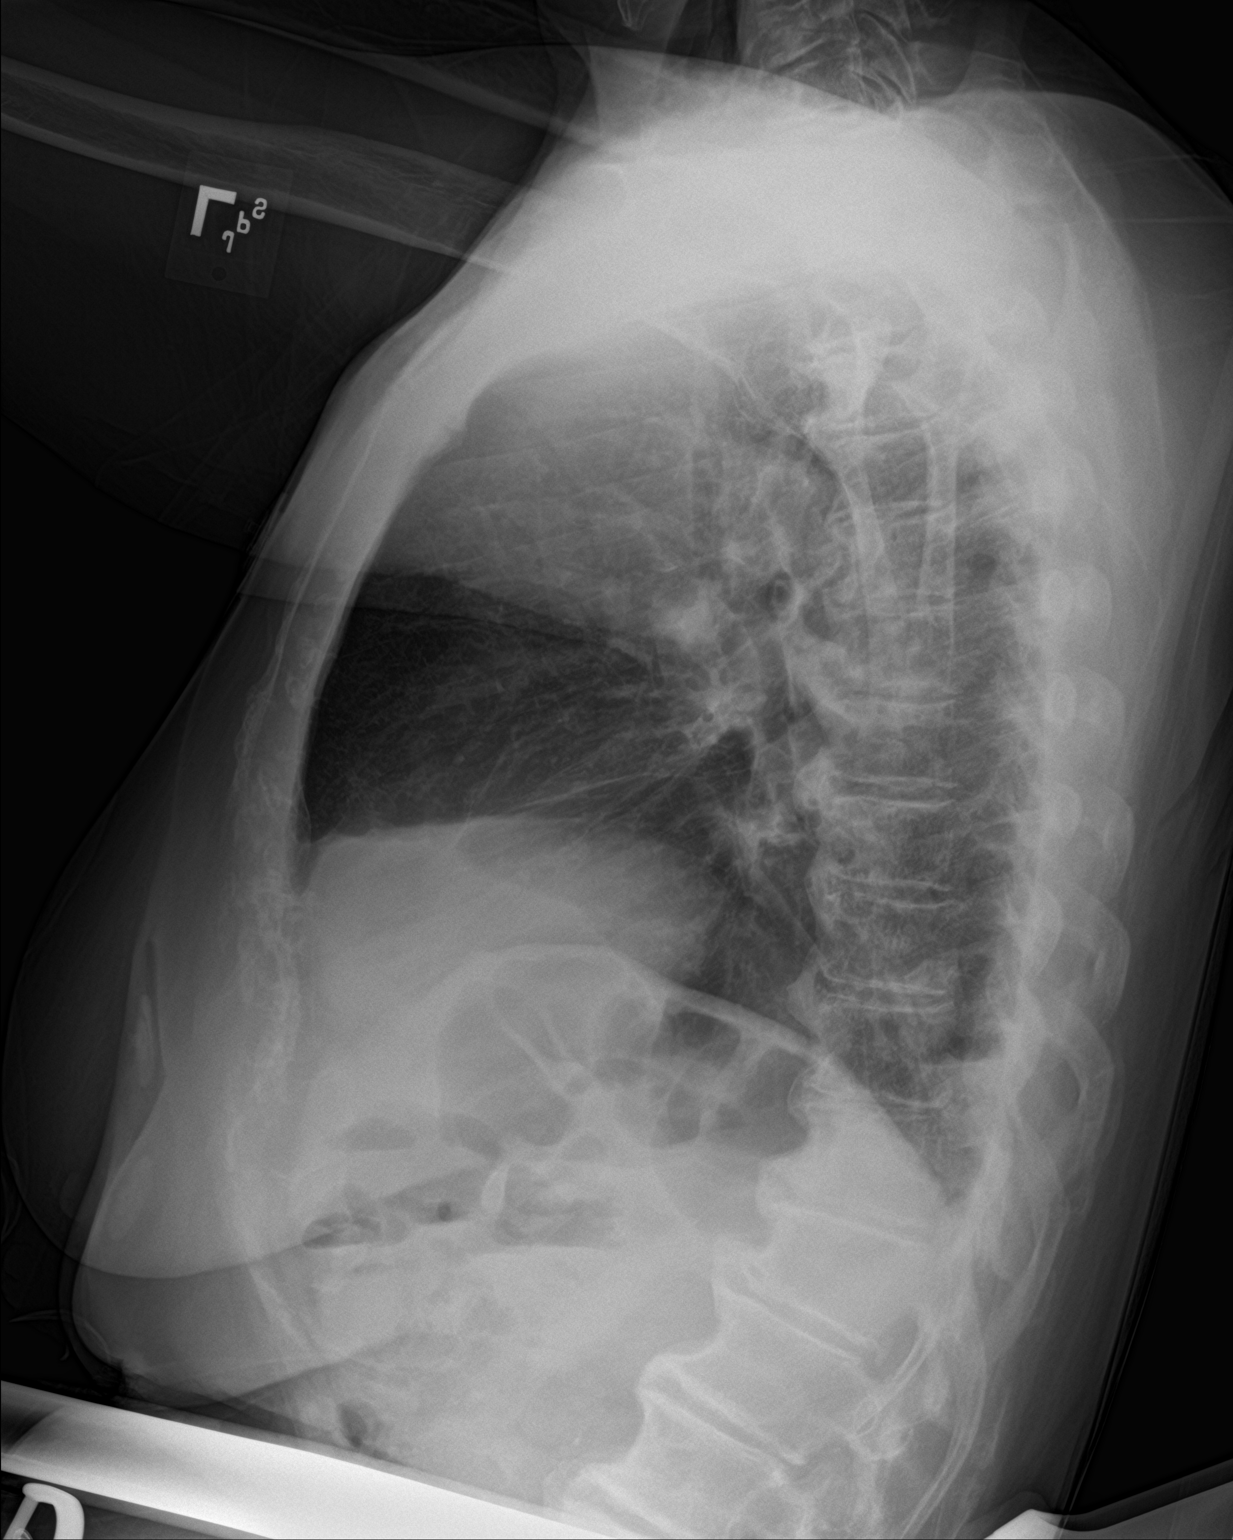

[chest ap]
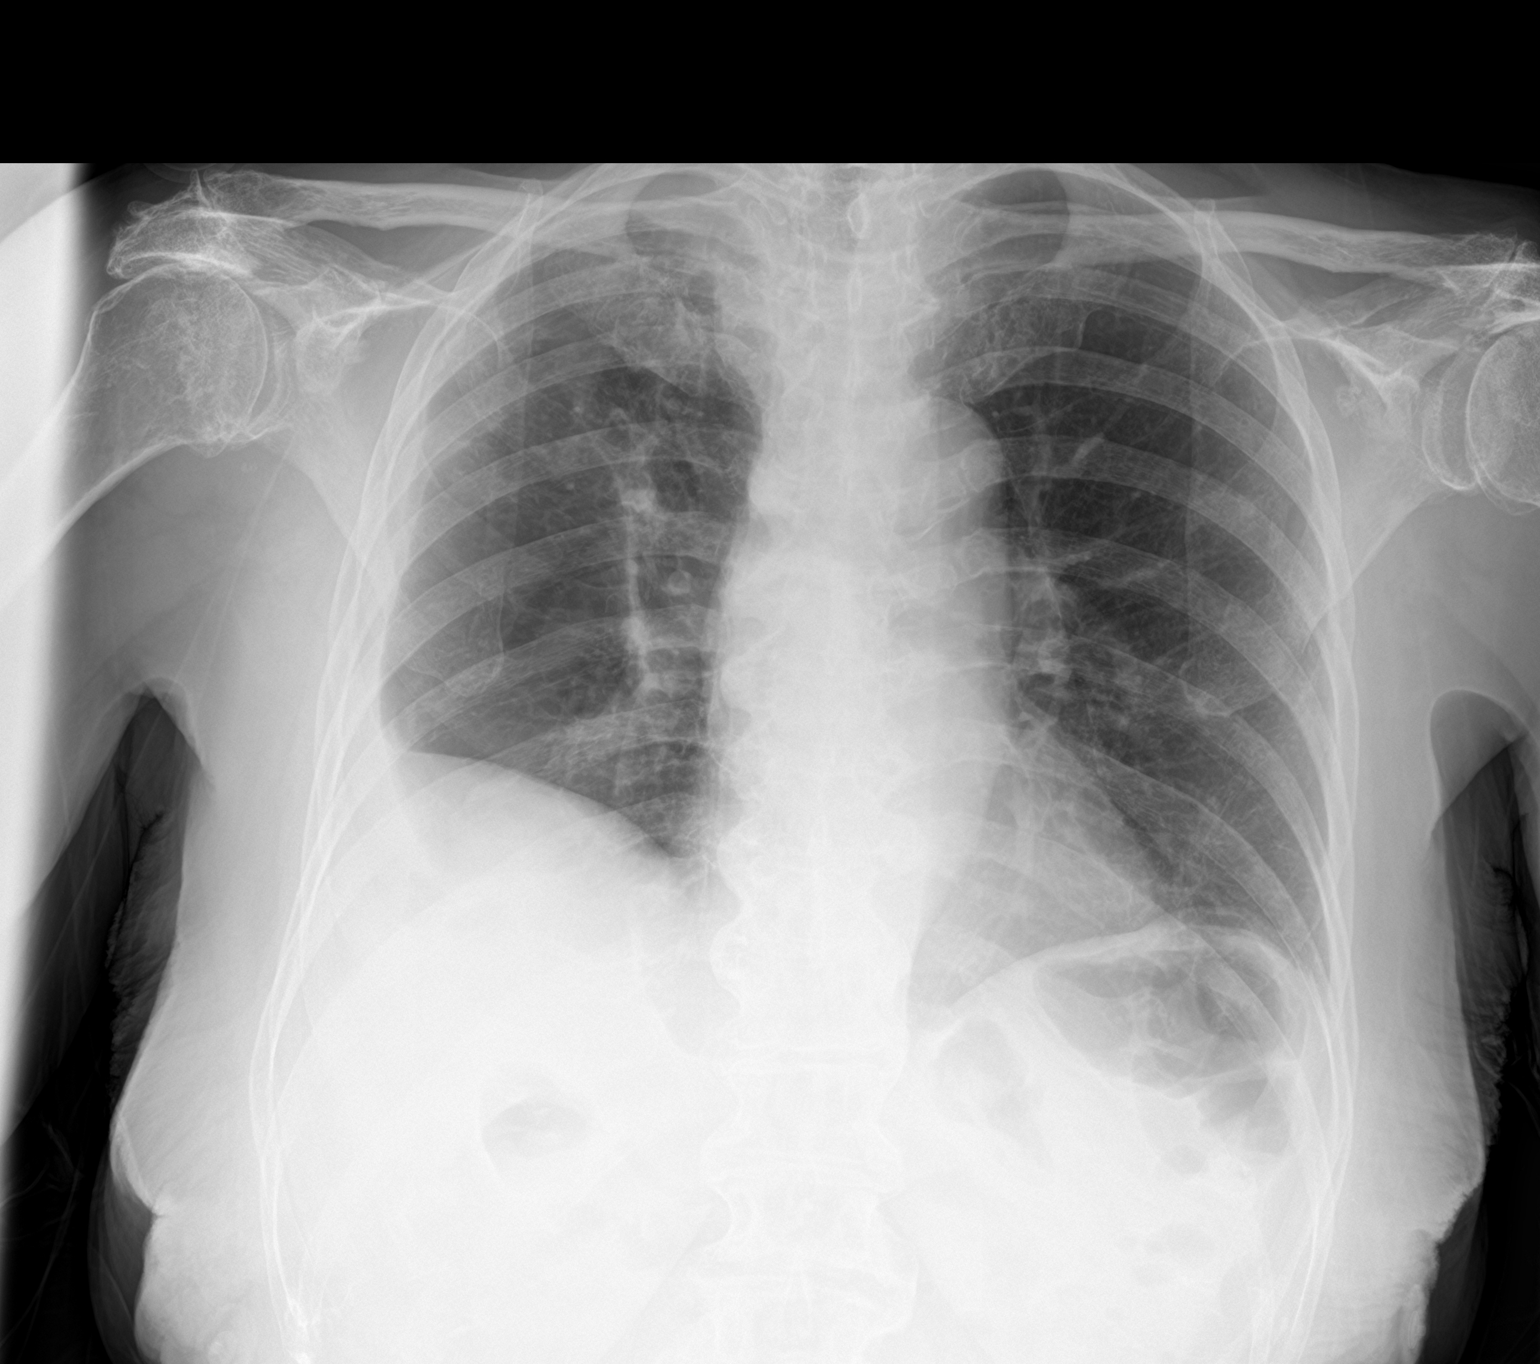

[2 of 2 positions shown; findings below may reference images not displayed]

FINDINGS: The heart size and mediastinal contours are within normal limits.
Small loculated right pleural effusion is noted. Minimal left
basilar subsegmental atelectasis is noted. Margin of large air
collection is again noted medially in the right lung suggesting
sequela of previous cavitating pneumonia; it does not appear to be
as thick walled as noted previously. Left lung is clear. The
visualized skeletal structures are unremarkable.
IMPRESSION: Stable small loculated right pleural effusion. Continued presence of
large air collection seen in right lung suggesting sequela of
previous cavitating pneumonia; it does not appear to be as thick
walled as noted previously.

## 2024-01-01 ENCOUNTER — Encounter (HOSPITAL_COMMUNITY): Payer: Self-pay | Admitting: Emergency Medicine

## 2024-01-01 ENCOUNTER — Other Ambulatory Visit: Payer: Self-pay

## 2024-01-01 ENCOUNTER — Emergency Department (HOSPITAL_COMMUNITY)
Admission: EM | Admit: 2024-01-01 | Discharge: 2024-01-01 | Disposition: A | Discharge status: Home / Self Care | Payer: Medicare (Managed Care) | Attending: Emergency Medicine | Admitting: Emergency Medicine

## 2024-01-01 DIAGNOSIS — R112 Nausea with vomiting, unspecified: Secondary | ICD-10-CM | POA: Diagnosis present

## 2024-01-01 LAB — CBC WITH DIFFERENTIAL/PLATELET
Abs Immature Granulocytes: 0.02 K/uL (ref 0.00–0.07)
Basophils Absolute: 0 K/uL (ref 0.0–0.1)
Basophils Relative: 0 %
Eosinophils Absolute: 0 K/uL (ref 0.0–0.5)
Eosinophils Relative: 0 %
HCT: 38.7 % (ref 36.0–46.0)
Hemoglobin: 12.5 g/dL (ref 12.0–15.0)
Immature Granulocytes: 0 %
Lymphocytes Relative: 18 %
Lymphs Abs: 1.7 K/uL (ref 0.7–4.0)
MCH: 32.2 pg (ref 26.0–34.0)
MCHC: 32.3 g/dL (ref 30.0–36.0)
MCV: 99.7 fL (ref 80.0–100.0)
Monocytes Absolute: 0.5 K/uL (ref 0.1–1.0)
Monocytes Relative: 5 %
Neutro Abs: 7.3 K/uL (ref 1.7–7.7)
Neutrophils Relative %: 77 %
Platelets: 236 K/uL (ref 150–400)
RBC: 3.88 MIL/uL (ref 3.87–5.11)
RDW: 12.7 % (ref 11.5–15.5)
WBC: 9.6 K/uL (ref 4.0–10.5)
nRBC: 0 % (ref 0.0–0.2)

## 2024-01-01 LAB — COMPREHENSIVE METABOLIC PANEL WITH GFR
ALT: 64 U/L — ABNORMAL HIGH (ref 0–44)
AST: 52 U/L — ABNORMAL HIGH (ref 15–41)
Albumin: 3.8 g/dL (ref 3.5–5.0)
Alkaline Phosphatase: 66 U/L (ref 38–126)
Anion gap: 11 (ref 5–15)
BUN: 23 mg/dL (ref 8–23)
CO2: 23 mmol/L (ref 22–32)
Calcium: 9.3 mg/dL (ref 8.9–10.3)
Chloride: 106 mmol/L (ref 98–111)
Creatinine, Ser: 1.25 mg/dL — ABNORMAL HIGH (ref 0.44–1.00)
GFR, Estimated: 43 mL/min — ABNORMAL LOW (ref >60)
Glucose, Bld: 123 mg/dL — ABNORMAL HIGH (ref 70–99)
Potassium: 4.2 mmol/L (ref 3.5–5.1)
Sodium: 140 mmol/L (ref 135–145)
Total Bilirubin: 0.8 mg/dL (ref 0.0–1.2)
Total Protein: 6.4 g/dL — ABNORMAL LOW (ref 6.5–8.1)

## 2024-01-01 LAB — LIPASE, BLOOD: Lipase: 30 U/L (ref 11–51)

## 2024-01-01 MED ORDER — ONDANSETRON HCL 4 MG PO TABS: 4.0000 mg | ORAL_TABLET | Freq: Four times a day (QID) | ORAL | 0 refills | Status: AC

## 2024-01-01 MED ORDER — ONDANSETRON HCL 4 MG/2ML IJ SOLN
4.0000 mg | Freq: Once | INTRAMUSCULAR | Status: AC
  Administered 2024-01-01: 4 mg via INTRAVENOUS
  Filled 2024-01-01: qty 2

## 2024-01-01 MED ORDER — LACTATED RINGERS IV BOLUS
1000.0000 mL | Freq: Once | INTRAVENOUS | Status: AC
  Administered 2024-01-01: 1000 mL via INTRAVENOUS

## 2024-01-01 NOTE — ED Provider Notes (Signed)
 [facility] Provider Note   CSN: [id number] Arrival date & time: 01/01/24  0040     History Chief Complaint  Patient presents with   Vomiting    HPI Beverly Howell is a 83 y.o. female presenting for chief complaint of sudden onset nausea vomiting after dinner.  Approximately 8 episodes this evening nonbilious nonbloody vomiting.  Otherwise healthy up-to-date on vaccines, ambulatory tolerating p.o. intake. States she is already starting to feel better into the evening.  Patient's recorded medical, surgical, social, medication list and allergies were reviewed in the Snapshot window as part of the initial history.   Review of Systems   Review of Systems  Constitutional:  Negative for chills and fever.  HENT:  Negative for ear pain and sore throat.   Eyes:  Negative for pain and visual disturbance.  Respiratory:  Negative for cough and shortness of breath.   Cardiovascular:  Negative for chest pain and palpitations.  Gastrointestinal:  Positive for abdominal pain, nausea and vomiting. Negative for diarrhea.  Genitourinary:  Negative for dysuria and hematuria.  Musculoskeletal:  Negative for arthralgias and back pain.  Skin:  Negative for color change and rash.  Neurological:  Negative for seizures and syncope.  All other systems reviewed and are negative.   Physical Exam Updated Vital Signs BP 136/74   Pulse 63   Resp (!) 22   Wt 81.6 kg   SpO2 97%   BMI 31.89 kg/m  Physical Exam Vitals and nursing note reviewed.  Constitutional:      General: She is not in acute distress.    Appearance: She is well-developed.  HENT:     Head: Normocephalic and atraumatic.  Eyes:     Conjunctiva/sclera: Conjunctivae normal.  Cardiovascular:     Rate and Rhythm: Normal rate and regular rhythm.     Heart sounds: No murmur heard. Pulmonary:     Effort: Pulmonary effort is normal. No respiratory distress.     Breath sounds: Normal breath  sounds.  Abdominal:     General: There is no distension.     Palpations: Abdomen is soft.     Tenderness: There is no abdominal tenderness. There is no right CVA tenderness or left CVA tenderness.  Musculoskeletal:        General: No swelling or tenderness. Normal range of motion.     Cervical back: Neck supple.  Skin:    General: Skin is warm and dry.  Neurological:     General: No focal deficit present.     Mental Status: She is alert and oriented to person, place, and time. Mental status is at baseline.     Cranial Nerves: No cranial nerve deficit.      ED Course/ Medical Decision Making/ A&P    Procedures Procedures   Medications Ordered in ED Medications  lactated ringers  bolus 1,000 mL (0 mLs Intravenous Stopped 01/01/24 0333)  ondansetron  (ZOFRAN ) injection 4 mg (4 mg Intravenous Given 01/01/24 0130)    Medical Decision Making:   Patient's history of present illness and physical exam findings are most consistent with GI syndrome. Considered appendicitis cholecystitis, small bowel obstruction all is less likely.  She is already improving at this time.  Biliary disease seems less likely.  Will check with blood work for acute pathology, symptomatically treat with IV fluids antiemetics and trial p.o.  Reassessment: Lab work all resulted with no acute pathology.  Patient is now ambulatory tolerating p.o. intake.  Abdominal exam remains  benign on serial reassessments.  Patient feels comfortable with outpatient care management follow-up with PCP.  Will treat with antiemetics in the outpatient setting recommend strict return precautions.  Disposition:  I have considered need for hospitalization, however, considering all of the above, I believe this patient is stable for discharge at this time.  Patient/family educated about specific return precautions for given chief complaint and symptoms.  Patient/family educated about follow-up with PCP.     Patient/family expressed  understanding of return precautions and need for follow-up. Patient spoken to regarding all imaging and laboratory results and appropriate follow up for these results. All education provided in verbal form with additional information in written form. Time was allowed for answering of patient questions. Patient discharged.    Emergency Department Medication Summary:   Medications  lactated ringers  bolus 1,000 mL (0 mLs Intravenous Stopped 01/01/24 0333)  ondansetron  (ZOFRAN ) injection 4 mg (4 mg Intravenous Given 01/01/24 0130)        Clinical Impression:  1. Nausea and vomiting in adult      Discharge   Final Clinical Impression(s) / ED Diagnoses Final diagnoses:  Nausea and vomiting in adult    Rx / DC Orders ED Discharge Orders          Ordered    ondansetron  (ZOFRAN ) 4 MG tablet  Every 6 hours        01/01/24 0313              Jerral Meth, MD 01/01/24 [phone]

## 2024-01-01 NOTE — ED Triage Notes (Signed)
 Patient BIB [facility] from home c/o n/v.  Patient reports vomiting about 7 times.  EMS reports bradycardia with afib.  Patient takes diltiazem  and metoprolol .    140/78 164 CBG HR 40-70 RR 16 98% RA

## 2024-01-01 NOTE — ED Notes (Signed)
 Patient tolerated PO fluids and saltine crackers. Patient states she does not feel nauseous and does not feel like vomiting.
